# Patient Record
Sex: Female | Born: 1940
Health system: Southern US, Community
[De-identification: ages and names within clinical notes are randomized; demographics above are authoritative.]

## PROBLEM LIST (undated history)

## (undated) DIAGNOSIS — R569 Unspecified convulsions: Secondary | ICD-10-CM

## (undated) DIAGNOSIS — C801 Malignant (primary) neoplasm, unspecified: Secondary | ICD-10-CM

## (undated) DIAGNOSIS — I10 Essential (primary) hypertension: Secondary | ICD-10-CM

## (undated) DIAGNOSIS — M1990 Unspecified osteoarthritis, unspecified site: Secondary | ICD-10-CM

## (undated) DIAGNOSIS — H269 Unspecified cataract: Secondary | ICD-10-CM

## (undated) DIAGNOSIS — E039 Hypothyroidism, unspecified: Secondary | ICD-10-CM

## (undated) DIAGNOSIS — G40909 Epilepsy, unspecified, not intractable, without status epilepticus: Secondary | ICD-10-CM

## (undated) DIAGNOSIS — E785 Hyperlipidemia, unspecified: Secondary | ICD-10-CM

## (undated) DIAGNOSIS — K635 Polyp of colon: Secondary | ICD-10-CM

## (undated) HISTORY — DX: Hyperlipidemia, unspecified: E78.5

## (undated) HISTORY — DX: Epilepsy, unspecified, not intractable, without status epilepticus: G40.909

## (undated) HISTORY — DX: Unspecified osteoarthritis, unspecified site: M19.90

## (undated) HISTORY — DX: Hypothyroidism, unspecified: E03.9

## (undated) HISTORY — DX: Malignant (primary) neoplasm, unspecified: C80.1

## (undated) HISTORY — DX: Essential (primary) hypertension: I10

## (undated) HISTORY — DX: Unspecified convulsions: R56.9

## (undated) HISTORY — DX: Unspecified cataract: H26.9

## (undated) HISTORY — DX: Polyp of colon: K63.5

---
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
## 1968-09-12 HISTORY — PX: ABDOMINAL HYSTERECTOMY: SHX81

## 1982-09-12 HISTORY — PX: BREAST SURGERY: SHX581

## 2006-09-12 HISTORY — PX: COLON SURGERY: SHX602

## 2008-09-12 DIAGNOSIS — H269 Unspecified cataract: Secondary | ICD-10-CM

## 2008-09-12 HISTORY — DX: Unspecified cataract: H26.9

## 2015-08-11 DIAGNOSIS — G40909 Epilepsy, unspecified, not intractable, without status epilepticus: Secondary | ICD-10-CM

## 2015-08-11 DIAGNOSIS — K635 Polyp of colon: Secondary | ICD-10-CM

## 2015-08-11 HISTORY — DX: Epilepsy, unspecified, not intractable, without status epilepticus: G40.909

## 2015-08-11 HISTORY — DX: Polyp of colon: K63.5

## 2015-12-08 DIAGNOSIS — E785 Hyperlipidemia, unspecified: Secondary | ICD-10-CM

## 2015-12-08 HISTORY — DX: Hyperlipidemia, unspecified: E78.5

## 2016-01-15 LAB — COLOGUARD: COLOGUARD: NEGATIVE

## 2016-04-16 LAB — LIPID PANEL
Cholesterol: 199 (ref 0–200)
HDL: 56 (ref 35–70)
LDL Cholesterol: 129
Triglycerides: 70 (ref 40–160)

## 2016-04-16 LAB — HEMOGLOBIN A1C: Hemoglobin A1C: 6.2

## 2016-04-16 LAB — TSH: TSH: 0.57 (ref <=5.90)

## 2016-09-12 HISTORY — PX: FEMUR SURGERY: SHX943

## 2016-09-26 DIAGNOSIS — H26413 Soemmering's ring, bilateral: Secondary | ICD-10-CM | POA: Diagnosis not present

## 2016-09-26 DIAGNOSIS — H353131 Nonexudative age-related macular degeneration, bilateral, early dry stage: Secondary | ICD-10-CM | POA: Diagnosis not present

## 2016-10-26 DIAGNOSIS — L570 Actinic keratosis: Secondary | ICD-10-CM | POA: Diagnosis not present

## 2016-10-26 DIAGNOSIS — Z85828 Personal history of other malignant neoplasm of skin: Secondary | ICD-10-CM | POA: Diagnosis not present

## 2016-10-26 DIAGNOSIS — L578 Other skin changes due to chronic exposure to nonionizing radiation: Secondary | ICD-10-CM | POA: Diagnosis not present

## 2016-10-28 DIAGNOSIS — G8929 Other chronic pain: Secondary | ICD-10-CM | POA: Diagnosis present

## 2016-10-28 DIAGNOSIS — R55 Syncope and collapse: Secondary | ICD-10-CM | POA: Diagnosis not present

## 2016-10-28 DIAGNOSIS — I472 Ventricular tachycardia: Secondary | ICD-10-CM | POA: Diagnosis not present

## 2016-10-28 DIAGNOSIS — G40909 Epilepsy, unspecified, not intractable, without status epilepticus: Secondary | ICD-10-CM | POA: Diagnosis not present

## 2016-10-28 DIAGNOSIS — S72402A Unspecified fracture of lower end of left femur, initial encounter for closed fracture: Secondary | ICD-10-CM | POA: Diagnosis not present

## 2016-10-28 DIAGNOSIS — Z9849 Cataract extraction status, unspecified eye: Secondary | ICD-10-CM | POA: Diagnosis not present

## 2016-10-28 DIAGNOSIS — M6281 Muscle weakness (generalized): Secondary | ICD-10-CM | POA: Diagnosis not present

## 2016-10-28 DIAGNOSIS — S72332A Displaced oblique fracture of shaft of left femur, initial encounter for closed fracture: Secondary | ICD-10-CM | POA: Diagnosis not present

## 2016-10-28 DIAGNOSIS — Z888 Allergy status to other drugs, medicaments and biological substances status: Secondary | ICD-10-CM | POA: Diagnosis not present

## 2016-10-28 DIAGNOSIS — Z884 Allergy status to anesthetic agent status: Secondary | ICD-10-CM | POA: Diagnosis not present

## 2016-10-28 DIAGNOSIS — Z86010 Personal history of colonic polyps: Secondary | ICD-10-CM | POA: Diagnosis not present

## 2016-10-28 DIAGNOSIS — I110 Hypertensive heart disease with heart failure: Secondary | ICD-10-CM | POA: Diagnosis present

## 2016-10-28 DIAGNOSIS — I369 Nonrheumatic tricuspid valve disorder, unspecified: Secondary | ICD-10-CM | POA: Diagnosis not present

## 2016-10-28 DIAGNOSIS — E785 Hyperlipidemia, unspecified: Secondary | ICD-10-CM | POA: Diagnosis present

## 2016-10-28 DIAGNOSIS — I272 Pulmonary hypertension, unspecified: Secondary | ICD-10-CM | POA: Diagnosis not present

## 2016-10-28 DIAGNOSIS — Z85828 Personal history of other malignant neoplasm of skin: Secondary | ICD-10-CM | POA: Diagnosis not present

## 2016-10-28 DIAGNOSIS — M25561 Pain in right knee: Secondary | ICD-10-CM | POA: Diagnosis not present

## 2016-10-28 DIAGNOSIS — Z90710 Acquired absence of both cervix and uterus: Secondary | ICD-10-CM | POA: Diagnosis not present

## 2016-10-28 DIAGNOSIS — I5030 Unspecified diastolic (congestive) heart failure: Secondary | ICD-10-CM | POA: Diagnosis present

## 2016-10-28 DIAGNOSIS — S72402D Unspecified fracture of lower end of left femur, subsequent encounter for closed fracture with routine healing: Secondary | ICD-10-CM | POA: Diagnosis not present

## 2016-10-28 DIAGNOSIS — R410 Disorientation, unspecified: Secondary | ICD-10-CM | POA: Diagnosis not present

## 2016-10-28 DIAGNOSIS — D72829 Elevated white blood cell count, unspecified: Secondary | ICD-10-CM | POA: Diagnosis not present

## 2016-10-28 DIAGNOSIS — Z01810 Encounter for preprocedural cardiovascular examination: Secondary | ICD-10-CM | POA: Diagnosis not present

## 2016-10-28 DIAGNOSIS — M810 Age-related osteoporosis without current pathological fracture: Secondary | ICD-10-CM | POA: Diagnosis present

## 2016-10-28 DIAGNOSIS — R52 Pain, unspecified: Secondary | ICD-10-CM | POA: Diagnosis not present

## 2016-10-28 DIAGNOSIS — I10 Essential (primary) hypertension: Secondary | ICD-10-CM | POA: Diagnosis not present

## 2016-10-28 DIAGNOSIS — M25562 Pain in left knee: Secondary | ICD-10-CM | POA: Diagnosis not present

## 2016-10-28 DIAGNOSIS — G40309 Generalized idiopathic epilepsy and epileptic syndromes, not intractable, without status epilepticus: Secondary | ICD-10-CM | POA: Diagnosis not present

## 2016-10-28 DIAGNOSIS — W19XXXD Unspecified fall, subsequent encounter: Secondary | ICD-10-CM | POA: Diagnosis not present

## 2016-10-28 DIAGNOSIS — Z79899 Other long term (current) drug therapy: Secondary | ICD-10-CM | POA: Diagnosis not present

## 2016-10-28 DIAGNOSIS — X58XXXA Exposure to other specified factors, initial encounter: Secondary | ICD-10-CM | POA: Diagnosis not present

## 2016-10-28 DIAGNOSIS — M1711 Unilateral primary osteoarthritis, right knee: Secondary | ICD-10-CM | POA: Diagnosis present

## 2016-10-28 DIAGNOSIS — G40209 Localization-related (focal) (partial) symptomatic epilepsy and epileptic syndromes with complex partial seizures, not intractable, without status epilepticus: Secondary | ICD-10-CM | POA: Diagnosis present

## 2016-10-28 DIAGNOSIS — G40919 Epilepsy, unspecified, intractable, without status epilepticus: Secondary | ICD-10-CM | POA: Diagnosis not present

## 2016-10-28 DIAGNOSIS — S72452A Displaced supracondylar fracture without intracondylar extension of lower end of left femur, initial encounter for closed fracture: Secondary | ICD-10-CM | POA: Diagnosis present

## 2016-10-28 DIAGNOSIS — S72492A Other fracture of lower end of left femur, initial encounter for closed fracture: Secondary | ICD-10-CM | POA: Diagnosis not present

## 2016-11-02 DIAGNOSIS — Z85828 Personal history of other malignant neoplasm of skin: Secondary | ICD-10-CM | POA: Diagnosis not present

## 2016-11-02 DIAGNOSIS — G40209 Localization-related (focal) (partial) symptomatic epilepsy and epileptic syndromes with complex partial seizures, not intractable, without status epilepticus: Secondary | ICD-10-CM | POA: Diagnosis not present

## 2016-11-02 DIAGNOSIS — M25562 Pain in left knee: Secondary | ICD-10-CM | POA: Diagnosis not present

## 2016-11-02 DIAGNOSIS — Z9289 Personal history of other medical treatment: Secondary | ICD-10-CM | POA: Diagnosis not present

## 2016-11-02 DIAGNOSIS — D72829 Elevated white blood cell count, unspecified: Secondary | ICD-10-CM | POA: Diagnosis not present

## 2016-11-02 DIAGNOSIS — I472 Ventricular tachycardia: Secondary | ICD-10-CM | POA: Diagnosis not present

## 2016-11-02 DIAGNOSIS — J9811 Atelectasis: Secondary | ICD-10-CM | POA: Diagnosis not present

## 2016-11-02 DIAGNOSIS — R339 Retention of urine, unspecified: Secondary | ICD-10-CM | POA: Diagnosis not present

## 2016-11-02 DIAGNOSIS — K5900 Constipation, unspecified: Secondary | ICD-10-CM | POA: Diagnosis not present

## 2016-11-02 DIAGNOSIS — M810 Age-related osteoporosis without current pathological fracture: Secondary | ICD-10-CM | POA: Diagnosis not present

## 2016-11-02 DIAGNOSIS — Z9849 Cataract extraction status, unspecified eye: Secondary | ICD-10-CM | POA: Diagnosis not present

## 2016-11-02 DIAGNOSIS — I10 Essential (primary) hypertension: Secondary | ICD-10-CM | POA: Diagnosis not present

## 2016-11-02 DIAGNOSIS — Z86010 Personal history of colonic polyps: Secondary | ICD-10-CM | POA: Diagnosis not present

## 2016-11-02 DIAGNOSIS — R600 Localized edema: Secondary | ICD-10-CM | POA: Diagnosis not present

## 2016-11-02 DIAGNOSIS — Z79899 Other long term (current) drug therapy: Secondary | ICD-10-CM | POA: Diagnosis not present

## 2016-11-02 DIAGNOSIS — G40919 Epilepsy, unspecified, intractable, without status epilepticus: Secondary | ICD-10-CM | POA: Diagnosis not present

## 2016-11-02 DIAGNOSIS — R5381 Other malaise: Secondary | ICD-10-CM | POA: Diagnosis not present

## 2016-11-02 DIAGNOSIS — S72402D Unspecified fracture of lower end of left femur, subsequent encounter for closed fracture with routine healing: Secondary | ICD-10-CM | POA: Diagnosis not present

## 2016-11-02 DIAGNOSIS — S72452D Displaced supracondylar fracture without intracondylar extension of lower end of left femur, subsequent encounter for closed fracture with routine healing: Secondary | ICD-10-CM | POA: Diagnosis not present

## 2016-11-02 DIAGNOSIS — G40909 Epilepsy, unspecified, not intractable, without status epilepticus: Secondary | ICD-10-CM | POA: Diagnosis not present

## 2016-11-02 DIAGNOSIS — E785 Hyperlipidemia, unspecified: Secondary | ICD-10-CM | POA: Diagnosis not present

## 2016-11-02 DIAGNOSIS — D649 Anemia, unspecified: Secondary | ICD-10-CM | POA: Diagnosis not present

## 2016-11-02 DIAGNOSIS — Z888 Allergy status to other drugs, medicaments and biological substances status: Secondary | ICD-10-CM | POA: Diagnosis not present

## 2016-11-02 DIAGNOSIS — R52 Pain, unspecified: Secondary | ICD-10-CM | POA: Diagnosis not present

## 2016-11-02 DIAGNOSIS — Z884 Allergy status to anesthetic agent status: Secondary | ICD-10-CM | POA: Diagnosis not present

## 2016-11-02 DIAGNOSIS — W19XXXD Unspecified fall, subsequent encounter: Secondary | ICD-10-CM | POA: Diagnosis not present

## 2016-11-02 DIAGNOSIS — Z90710 Acquired absence of both cervix and uterus: Secondary | ICD-10-CM | POA: Diagnosis not present

## 2016-11-02 DIAGNOSIS — Z4889 Encounter for other specified surgical aftercare: Secondary | ICD-10-CM | POA: Diagnosis not present

## 2016-11-02 DIAGNOSIS — G8929 Other chronic pain: Secondary | ICD-10-CM | POA: Diagnosis not present

## 2016-11-02 DIAGNOSIS — I5030 Unspecified diastolic (congestive) heart failure: Secondary | ICD-10-CM | POA: Diagnosis not present

## 2016-11-02 DIAGNOSIS — R55 Syncope and collapse: Secondary | ICD-10-CM | POA: Diagnosis not present

## 2016-11-02 DIAGNOSIS — M1711 Unilateral primary osteoarthritis, right knee: Secondary | ICD-10-CM | POA: Diagnosis not present

## 2016-11-02 DIAGNOSIS — S72402S Unspecified fracture of lower end of left femur, sequela: Secondary | ICD-10-CM | POA: Diagnosis not present

## 2016-11-02 DIAGNOSIS — G40001 Localization-related (focal) (partial) idiopathic epilepsy and epileptic syndromes with seizures of localized onset, not intractable, with status epilepticus: Secondary | ICD-10-CM | POA: Diagnosis not present

## 2016-11-02 DIAGNOSIS — S72452A Displaced supracondylar fracture without intracondylar extension of lower end of left femur, initial encounter for closed fracture: Secondary | ICD-10-CM | POA: Diagnosis not present

## 2016-11-02 DIAGNOSIS — I110 Hypertensive heart disease with heart failure: Secondary | ICD-10-CM | POA: Diagnosis not present

## 2016-11-02 DIAGNOSIS — M6281 Muscle weakness (generalized): Secondary | ICD-10-CM | POA: Diagnosis not present

## 2016-11-02 DIAGNOSIS — D473 Essential (hemorrhagic) thrombocythemia: Secondary | ICD-10-CM | POA: Diagnosis not present

## 2016-11-04 DIAGNOSIS — Z4889 Encounter for other specified surgical aftercare: Secondary | ICD-10-CM | POA: Diagnosis not present

## 2016-11-04 DIAGNOSIS — R55 Syncope and collapse: Secondary | ICD-10-CM | POA: Diagnosis not present

## 2016-11-04 DIAGNOSIS — Z9289 Personal history of other medical treatment: Secondary | ICD-10-CM | POA: Diagnosis not present

## 2016-11-04 DIAGNOSIS — S72402S Unspecified fracture of lower end of left femur, sequela: Secondary | ICD-10-CM | POA: Diagnosis not present

## 2016-11-04 DIAGNOSIS — D649 Anemia, unspecified: Secondary | ICD-10-CM | POA: Diagnosis not present

## 2016-11-04 DIAGNOSIS — G40001 Localization-related (focal) (partial) idiopathic epilepsy and epileptic syndromes with seizures of localized onset, not intractable, with status epilepticus: Secondary | ICD-10-CM | POA: Diagnosis not present

## 2016-11-04 DIAGNOSIS — K5900 Constipation, unspecified: Secondary | ICD-10-CM | POA: Diagnosis not present

## 2016-11-04 DIAGNOSIS — R5381 Other malaise: Secondary | ICD-10-CM | POA: Diagnosis not present

## 2016-11-04 DIAGNOSIS — D473 Essential (hemorrhagic) thrombocythemia: Secondary | ICD-10-CM | POA: Diagnosis not present

## 2016-11-04 DIAGNOSIS — I10 Essential (primary) hypertension: Secondary | ICD-10-CM | POA: Diagnosis not present

## 2016-11-07 DIAGNOSIS — R55 Syncope and collapse: Secondary | ICD-10-CM | POA: Diagnosis not present

## 2016-11-07 DIAGNOSIS — J9811 Atelectasis: Secondary | ICD-10-CM | POA: Diagnosis not present

## 2016-11-07 DIAGNOSIS — R339 Retention of urine, unspecified: Secondary | ICD-10-CM | POA: Diagnosis not present

## 2016-11-07 DIAGNOSIS — S72402S Unspecified fracture of lower end of left femur, sequela: Secondary | ICD-10-CM | POA: Diagnosis not present

## 2016-11-09 DIAGNOSIS — R55 Syncope and collapse: Secondary | ICD-10-CM | POA: Diagnosis not present

## 2016-11-10 DIAGNOSIS — S72402S Unspecified fracture of lower end of left femur, sequela: Secondary | ICD-10-CM | POA: Diagnosis not present

## 2016-11-10 DIAGNOSIS — G40909 Epilepsy, unspecified, not intractable, without status epilepticus: Secondary | ICD-10-CM | POA: Diagnosis not present

## 2016-11-10 DIAGNOSIS — R55 Syncope and collapse: Secondary | ICD-10-CM | POA: Diagnosis not present

## 2016-11-18 DIAGNOSIS — R339 Retention of urine, unspecified: Secondary | ICD-10-CM | POA: Diagnosis not present

## 2016-11-18 DIAGNOSIS — S72402S Unspecified fracture of lower end of left femur, sequela: Secondary | ICD-10-CM | POA: Diagnosis not present

## 2016-11-22 DIAGNOSIS — S72402S Unspecified fracture of lower end of left femur, sequela: Secondary | ICD-10-CM | POA: Diagnosis not present

## 2016-11-22 DIAGNOSIS — R339 Retention of urine, unspecified: Secondary | ICD-10-CM | POA: Diagnosis not present

## 2016-12-05 DIAGNOSIS — R52 Pain, unspecified: Secondary | ICD-10-CM | POA: Diagnosis not present

## 2016-12-05 DIAGNOSIS — S72402S Unspecified fracture of lower end of left femur, sequela: Secondary | ICD-10-CM | POA: Diagnosis not present

## 2016-12-05 DIAGNOSIS — I10 Essential (primary) hypertension: Secondary | ICD-10-CM | POA: Diagnosis not present

## 2016-12-05 DIAGNOSIS — R339 Retention of urine, unspecified: Secondary | ICD-10-CM | POA: Diagnosis not present

## 2016-12-05 DIAGNOSIS — G40909 Epilepsy, unspecified, not intractable, without status epilepticus: Secondary | ICD-10-CM | POA: Diagnosis not present

## 2016-12-09 DIAGNOSIS — G40909 Epilepsy, unspecified, not intractable, without status epilepticus: Secondary | ICD-10-CM | POA: Diagnosis not present

## 2016-12-09 DIAGNOSIS — S72402S Unspecified fracture of lower end of left femur, sequela: Secondary | ICD-10-CM | POA: Diagnosis not present

## 2016-12-09 DIAGNOSIS — I10 Essential (primary) hypertension: Secondary | ICD-10-CM | POA: Diagnosis not present

## 2016-12-13 DIAGNOSIS — S72452D Displaced supracondylar fracture without intracondylar extension of lower end of left femur, subsequent encounter for closed fracture with routine healing: Secondary | ICD-10-CM | POA: Diagnosis not present

## 2016-12-20 DIAGNOSIS — R600 Localized edema: Secondary | ICD-10-CM | POA: Diagnosis not present

## 2016-12-20 DIAGNOSIS — S72402S Unspecified fracture of lower end of left femur, sequela: Secondary | ICD-10-CM | POA: Diagnosis not present

## 2016-12-20 DIAGNOSIS — G40909 Epilepsy, unspecified, not intractable, without status epilepticus: Secondary | ICD-10-CM | POA: Diagnosis not present

## 2016-12-29 DIAGNOSIS — M25562 Pain in left knee: Secondary | ICD-10-CM | POA: Diagnosis not present

## 2016-12-29 DIAGNOSIS — I10 Essential (primary) hypertension: Secondary | ICD-10-CM | POA: Diagnosis not present

## 2016-12-29 DIAGNOSIS — R5381 Other malaise: Secondary | ICD-10-CM | POA: Diagnosis not present

## 2016-12-29 DIAGNOSIS — Z85828 Personal history of other malignant neoplasm of skin: Secondary | ICD-10-CM | POA: Diagnosis not present

## 2016-12-29 DIAGNOSIS — G40919 Epilepsy, unspecified, intractable, without status epilepticus: Secondary | ICD-10-CM | POA: Diagnosis not present

## 2016-12-29 DIAGNOSIS — W19XXXD Unspecified fall, subsequent encounter: Secondary | ICD-10-CM | POA: Diagnosis not present

## 2016-12-29 DIAGNOSIS — M80052D Age-related osteoporosis with current pathological fracture, left femur, subsequent encounter for fracture with routine healing: Secondary | ICD-10-CM | POA: Diagnosis not present

## 2016-12-30 DIAGNOSIS — R600 Localized edema: Secondary | ICD-10-CM | POA: Diagnosis not present

## 2016-12-30 DIAGNOSIS — S72402S Unspecified fracture of lower end of left femur, sequela: Secondary | ICD-10-CM | POA: Diagnosis not present

## 2016-12-30 DIAGNOSIS — G40909 Epilepsy, unspecified, not intractable, without status epilepticus: Secondary | ICD-10-CM | POA: Diagnosis not present

## 2017-01-02 DIAGNOSIS — M80052D Age-related osteoporosis with current pathological fracture, left femur, subsequent encounter for fracture with routine healing: Secondary | ICD-10-CM | POA: Diagnosis not present

## 2017-01-02 DIAGNOSIS — G40919 Epilepsy, unspecified, intractable, without status epilepticus: Secondary | ICD-10-CM | POA: Diagnosis not present

## 2017-01-02 DIAGNOSIS — M25562 Pain in left knee: Secondary | ICD-10-CM | POA: Diagnosis not present

## 2017-01-02 DIAGNOSIS — Z85828 Personal history of other malignant neoplasm of skin: Secondary | ICD-10-CM | POA: Diagnosis not present

## 2017-01-02 DIAGNOSIS — I10 Essential (primary) hypertension: Secondary | ICD-10-CM | POA: Diagnosis not present

## 2017-01-02 DIAGNOSIS — R5381 Other malaise: Secondary | ICD-10-CM | POA: Diagnosis not present

## 2017-01-03 DIAGNOSIS — G40919 Epilepsy, unspecified, intractable, without status epilepticus: Secondary | ICD-10-CM | POA: Diagnosis not present

## 2017-01-03 DIAGNOSIS — M80052D Age-related osteoporosis with current pathological fracture, left femur, subsequent encounter for fracture with routine healing: Secondary | ICD-10-CM | POA: Diagnosis not present

## 2017-01-03 DIAGNOSIS — R5381 Other malaise: Secondary | ICD-10-CM | POA: Diagnosis not present

## 2017-01-03 DIAGNOSIS — M25562 Pain in left knee: Secondary | ICD-10-CM | POA: Diagnosis not present

## 2017-01-03 DIAGNOSIS — Z85828 Personal history of other malignant neoplasm of skin: Secondary | ICD-10-CM | POA: Diagnosis not present

## 2017-01-03 DIAGNOSIS — I10 Essential (primary) hypertension: Secondary | ICD-10-CM | POA: Diagnosis not present

## 2017-01-04 DIAGNOSIS — M25562 Pain in left knee: Secondary | ICD-10-CM | POA: Diagnosis not present

## 2017-01-04 DIAGNOSIS — I10 Essential (primary) hypertension: Secondary | ICD-10-CM | POA: Diagnosis not present

## 2017-01-04 DIAGNOSIS — G40919 Epilepsy, unspecified, intractable, without status epilepticus: Secondary | ICD-10-CM | POA: Diagnosis not present

## 2017-01-04 DIAGNOSIS — Z85828 Personal history of other malignant neoplasm of skin: Secondary | ICD-10-CM | POA: Diagnosis not present

## 2017-01-04 DIAGNOSIS — R5381 Other malaise: Secondary | ICD-10-CM | POA: Diagnosis not present

## 2017-01-04 DIAGNOSIS — M80052D Age-related osteoporosis with current pathological fracture, left femur, subsequent encounter for fracture with routine healing: Secondary | ICD-10-CM | POA: Diagnosis not present

## 2017-01-09 DIAGNOSIS — I10 Essential (primary) hypertension: Secondary | ICD-10-CM | POA: Diagnosis not present

## 2017-01-09 DIAGNOSIS — M25562 Pain in left knee: Secondary | ICD-10-CM | POA: Diagnosis not present

## 2017-01-09 DIAGNOSIS — M80052D Age-related osteoporosis with current pathological fracture, left femur, subsequent encounter for fracture with routine healing: Secondary | ICD-10-CM | POA: Diagnosis not present

## 2017-01-09 DIAGNOSIS — G40919 Epilepsy, unspecified, intractable, without status epilepticus: Secondary | ICD-10-CM | POA: Diagnosis not present

## 2017-01-09 DIAGNOSIS — R5381 Other malaise: Secondary | ICD-10-CM | POA: Diagnosis not present

## 2017-01-09 DIAGNOSIS — Z85828 Personal history of other malignant neoplasm of skin: Secondary | ICD-10-CM | POA: Diagnosis not present

## 2017-01-10 DIAGNOSIS — Z85828 Personal history of other malignant neoplasm of skin: Secondary | ICD-10-CM | POA: Diagnosis not present

## 2017-01-10 DIAGNOSIS — M25562 Pain in left knee: Secondary | ICD-10-CM | POA: Diagnosis not present

## 2017-01-10 DIAGNOSIS — G40919 Epilepsy, unspecified, intractable, without status epilepticus: Secondary | ICD-10-CM | POA: Diagnosis not present

## 2017-01-10 DIAGNOSIS — M80052D Age-related osteoporosis with current pathological fracture, left femur, subsequent encounter for fracture with routine healing: Secondary | ICD-10-CM | POA: Diagnosis not present

## 2017-01-10 DIAGNOSIS — I10 Essential (primary) hypertension: Secondary | ICD-10-CM | POA: Diagnosis not present

## 2017-01-10 DIAGNOSIS — S72452D Displaced supracondylar fracture without intracondylar extension of lower end of left femur, subsequent encounter for closed fracture with routine healing: Secondary | ICD-10-CM | POA: Diagnosis not present

## 2017-01-10 DIAGNOSIS — R5381 Other malaise: Secondary | ICD-10-CM | POA: Diagnosis not present

## 2017-01-11 DIAGNOSIS — M80052D Age-related osteoporosis with current pathological fracture, left femur, subsequent encounter for fracture with routine healing: Secondary | ICD-10-CM | POA: Diagnosis not present

## 2017-01-11 DIAGNOSIS — I10 Essential (primary) hypertension: Secondary | ICD-10-CM | POA: Diagnosis not present

## 2017-01-11 DIAGNOSIS — M25562 Pain in left knee: Secondary | ICD-10-CM | POA: Diagnosis not present

## 2017-01-11 DIAGNOSIS — G40919 Epilepsy, unspecified, intractable, without status epilepticus: Secondary | ICD-10-CM | POA: Diagnosis not present

## 2017-01-11 DIAGNOSIS — Z85828 Personal history of other malignant neoplasm of skin: Secondary | ICD-10-CM | POA: Diagnosis not present

## 2017-01-11 DIAGNOSIS — R5381 Other malaise: Secondary | ICD-10-CM | POA: Diagnosis not present

## 2017-01-13 DIAGNOSIS — I10 Essential (primary) hypertension: Secondary | ICD-10-CM | POA: Diagnosis not present

## 2017-01-13 DIAGNOSIS — Z85828 Personal history of other malignant neoplasm of skin: Secondary | ICD-10-CM | POA: Diagnosis not present

## 2017-01-13 DIAGNOSIS — G40919 Epilepsy, unspecified, intractable, without status epilepticus: Secondary | ICD-10-CM | POA: Diagnosis not present

## 2017-01-13 DIAGNOSIS — M80052D Age-related osteoporosis with current pathological fracture, left femur, subsequent encounter for fracture with routine healing: Secondary | ICD-10-CM | POA: Diagnosis not present

## 2017-01-13 DIAGNOSIS — R5381 Other malaise: Secondary | ICD-10-CM | POA: Diagnosis not present

## 2017-01-13 DIAGNOSIS — M25562 Pain in left knee: Secondary | ICD-10-CM | POA: Diagnosis not present

## 2017-01-16 DIAGNOSIS — G40919 Epilepsy, unspecified, intractable, without status epilepticus: Secondary | ICD-10-CM | POA: Diagnosis not present

## 2017-01-16 DIAGNOSIS — R5381 Other malaise: Secondary | ICD-10-CM | POA: Diagnosis not present

## 2017-01-16 DIAGNOSIS — M80052D Age-related osteoporosis with current pathological fracture, left femur, subsequent encounter for fracture with routine healing: Secondary | ICD-10-CM | POA: Diagnosis not present

## 2017-01-16 DIAGNOSIS — I10 Essential (primary) hypertension: Secondary | ICD-10-CM | POA: Diagnosis not present

## 2017-01-16 DIAGNOSIS — M25562 Pain in left knee: Secondary | ICD-10-CM | POA: Diagnosis not present

## 2017-01-16 DIAGNOSIS — Z85828 Personal history of other malignant neoplasm of skin: Secondary | ICD-10-CM | POA: Diagnosis not present

## 2017-01-18 DIAGNOSIS — G40919 Epilepsy, unspecified, intractable, without status epilepticus: Secondary | ICD-10-CM | POA: Diagnosis not present

## 2017-01-18 DIAGNOSIS — I10 Essential (primary) hypertension: Secondary | ICD-10-CM | POA: Diagnosis not present

## 2017-01-18 DIAGNOSIS — R5381 Other malaise: Secondary | ICD-10-CM | POA: Diagnosis not present

## 2017-01-18 DIAGNOSIS — M80052D Age-related osteoporosis with current pathological fracture, left femur, subsequent encounter for fracture with routine healing: Secondary | ICD-10-CM | POA: Diagnosis not present

## 2017-01-18 DIAGNOSIS — M25562 Pain in left knee: Secondary | ICD-10-CM | POA: Diagnosis not present

## 2017-01-18 DIAGNOSIS — Z85828 Personal history of other malignant neoplasm of skin: Secondary | ICD-10-CM | POA: Diagnosis not present

## 2017-01-19 DIAGNOSIS — G40919 Epilepsy, unspecified, intractable, without status epilepticus: Secondary | ICD-10-CM | POA: Diagnosis not present

## 2017-01-19 DIAGNOSIS — I10 Essential (primary) hypertension: Secondary | ICD-10-CM | POA: Diagnosis not present

## 2017-01-19 DIAGNOSIS — M25562 Pain in left knee: Secondary | ICD-10-CM | POA: Diagnosis not present

## 2017-01-19 DIAGNOSIS — R5381 Other malaise: Secondary | ICD-10-CM | POA: Diagnosis not present

## 2017-01-19 DIAGNOSIS — Z85828 Personal history of other malignant neoplasm of skin: Secondary | ICD-10-CM | POA: Diagnosis not present

## 2017-01-19 DIAGNOSIS — M80052D Age-related osteoporosis with current pathological fracture, left femur, subsequent encounter for fracture with routine healing: Secondary | ICD-10-CM | POA: Diagnosis not present

## 2017-01-23 DIAGNOSIS — I10 Essential (primary) hypertension: Secondary | ICD-10-CM | POA: Diagnosis not present

## 2017-01-23 DIAGNOSIS — M80052D Age-related osteoporosis with current pathological fracture, left femur, subsequent encounter for fracture with routine healing: Secondary | ICD-10-CM | POA: Diagnosis not present

## 2017-01-23 DIAGNOSIS — Z85828 Personal history of other malignant neoplasm of skin: Secondary | ICD-10-CM | POA: Diagnosis not present

## 2017-01-23 DIAGNOSIS — M25562 Pain in left knee: Secondary | ICD-10-CM | POA: Diagnosis not present

## 2017-01-23 DIAGNOSIS — R5381 Other malaise: Secondary | ICD-10-CM | POA: Diagnosis not present

## 2017-01-23 DIAGNOSIS — G40919 Epilepsy, unspecified, intractable, without status epilepticus: Secondary | ICD-10-CM | POA: Diagnosis not present

## 2017-01-24 DIAGNOSIS — M80052D Age-related osteoporosis with current pathological fracture, left femur, subsequent encounter for fracture with routine healing: Secondary | ICD-10-CM | POA: Diagnosis not present

## 2017-01-24 DIAGNOSIS — M25562 Pain in left knee: Secondary | ICD-10-CM | POA: Diagnosis not present

## 2017-01-24 DIAGNOSIS — G40919 Epilepsy, unspecified, intractable, without status epilepticus: Secondary | ICD-10-CM | POA: Diagnosis not present

## 2017-01-24 DIAGNOSIS — R5381 Other malaise: Secondary | ICD-10-CM | POA: Diagnosis not present

## 2017-01-24 DIAGNOSIS — I10 Essential (primary) hypertension: Secondary | ICD-10-CM | POA: Diagnosis not present

## 2017-01-24 DIAGNOSIS — Z85828 Personal history of other malignant neoplasm of skin: Secondary | ICD-10-CM | POA: Diagnosis not present

## 2017-01-25 DIAGNOSIS — G40919 Epilepsy, unspecified, intractable, without status epilepticus: Secondary | ICD-10-CM | POA: Diagnosis not present

## 2017-01-25 DIAGNOSIS — M25562 Pain in left knee: Secondary | ICD-10-CM | POA: Diagnosis not present

## 2017-01-25 DIAGNOSIS — Z85828 Personal history of other malignant neoplasm of skin: Secondary | ICD-10-CM | POA: Diagnosis not present

## 2017-01-25 DIAGNOSIS — R5381 Other malaise: Secondary | ICD-10-CM | POA: Diagnosis not present

## 2017-01-25 DIAGNOSIS — I10 Essential (primary) hypertension: Secondary | ICD-10-CM | POA: Diagnosis not present

## 2017-01-25 DIAGNOSIS — M80052D Age-related osteoporosis with current pathological fracture, left femur, subsequent encounter for fracture with routine healing: Secondary | ICD-10-CM | POA: Diagnosis not present

## 2017-01-26 DIAGNOSIS — Z23 Encounter for immunization: Secondary | ICD-10-CM | POA: Diagnosis not present

## 2017-01-26 DIAGNOSIS — G40309 Generalized idiopathic epilepsy and epileptic syndromes, not intractable, without status epilepticus: Secondary | ICD-10-CM | POA: Diagnosis not present

## 2017-01-26 DIAGNOSIS — M84750D Atypical femoral fracture, unspecified, subsequent encounter for fracture with routine healing: Secondary | ICD-10-CM | POA: Diagnosis not present

## 2017-01-26 DIAGNOSIS — R600 Localized edema: Secondary | ICD-10-CM | POA: Diagnosis not present

## 2017-01-26 DIAGNOSIS — M6281 Muscle weakness (generalized): Secondary | ICD-10-CM | POA: Diagnosis not present

## 2017-01-27 DIAGNOSIS — I10 Essential (primary) hypertension: Secondary | ICD-10-CM | POA: Diagnosis not present

## 2017-01-27 DIAGNOSIS — Z85828 Personal history of other malignant neoplasm of skin: Secondary | ICD-10-CM | POA: Diagnosis not present

## 2017-01-27 DIAGNOSIS — M80052D Age-related osteoporosis with current pathological fracture, left femur, subsequent encounter for fracture with routine healing: Secondary | ICD-10-CM | POA: Diagnosis not present

## 2017-01-27 DIAGNOSIS — G40919 Epilepsy, unspecified, intractable, without status epilepticus: Secondary | ICD-10-CM | POA: Diagnosis not present

## 2017-01-27 DIAGNOSIS — R5381 Other malaise: Secondary | ICD-10-CM | POA: Diagnosis not present

## 2017-01-27 DIAGNOSIS — M25562 Pain in left knee: Secondary | ICD-10-CM | POA: Diagnosis not present

## 2017-01-30 DIAGNOSIS — R5381 Other malaise: Secondary | ICD-10-CM | POA: Diagnosis not present

## 2017-01-30 DIAGNOSIS — I10 Essential (primary) hypertension: Secondary | ICD-10-CM | POA: Diagnosis not present

## 2017-01-30 DIAGNOSIS — M25562 Pain in left knee: Secondary | ICD-10-CM | POA: Diagnosis not present

## 2017-01-30 DIAGNOSIS — Z85828 Personal history of other malignant neoplasm of skin: Secondary | ICD-10-CM | POA: Diagnosis not present

## 2017-01-30 DIAGNOSIS — G40919 Epilepsy, unspecified, intractable, without status epilepticus: Secondary | ICD-10-CM | POA: Diagnosis not present

## 2017-01-30 DIAGNOSIS — M80052D Age-related osteoporosis with current pathological fracture, left femur, subsequent encounter for fracture with routine healing: Secondary | ICD-10-CM | POA: Diagnosis not present

## 2017-01-31 DIAGNOSIS — G40919 Epilepsy, unspecified, intractable, without status epilepticus: Secondary | ICD-10-CM | POA: Diagnosis not present

## 2017-01-31 DIAGNOSIS — R5381 Other malaise: Secondary | ICD-10-CM | POA: Diagnosis not present

## 2017-01-31 DIAGNOSIS — I10 Essential (primary) hypertension: Secondary | ICD-10-CM | POA: Diagnosis not present

## 2017-01-31 DIAGNOSIS — Z85828 Personal history of other malignant neoplasm of skin: Secondary | ICD-10-CM | POA: Diagnosis not present

## 2017-01-31 DIAGNOSIS — M25562 Pain in left knee: Secondary | ICD-10-CM | POA: Diagnosis not present

## 2017-01-31 DIAGNOSIS — M80052D Age-related osteoporosis with current pathological fracture, left femur, subsequent encounter for fracture with routine healing: Secondary | ICD-10-CM | POA: Diagnosis not present

## 2017-02-01 DIAGNOSIS — G40919 Epilepsy, unspecified, intractable, without status epilepticus: Secondary | ICD-10-CM | POA: Diagnosis not present

## 2017-02-01 DIAGNOSIS — M25562 Pain in left knee: Secondary | ICD-10-CM | POA: Diagnosis not present

## 2017-02-01 DIAGNOSIS — M80052D Age-related osteoporosis with current pathological fracture, left femur, subsequent encounter for fracture with routine healing: Secondary | ICD-10-CM | POA: Diagnosis not present

## 2017-02-01 DIAGNOSIS — I10 Essential (primary) hypertension: Secondary | ICD-10-CM | POA: Diagnosis not present

## 2017-02-01 DIAGNOSIS — R5381 Other malaise: Secondary | ICD-10-CM | POA: Diagnosis not present

## 2017-02-01 DIAGNOSIS — Z85828 Personal history of other malignant neoplasm of skin: Secondary | ICD-10-CM | POA: Diagnosis not present

## 2017-02-03 DIAGNOSIS — G40919 Epilepsy, unspecified, intractable, without status epilepticus: Secondary | ICD-10-CM | POA: Diagnosis not present

## 2017-02-03 DIAGNOSIS — M80052D Age-related osteoporosis with current pathological fracture, left femur, subsequent encounter for fracture with routine healing: Secondary | ICD-10-CM | POA: Diagnosis not present

## 2017-02-03 DIAGNOSIS — R5381 Other malaise: Secondary | ICD-10-CM | POA: Diagnosis not present

## 2017-02-03 DIAGNOSIS — I10 Essential (primary) hypertension: Secondary | ICD-10-CM | POA: Diagnosis not present

## 2017-02-03 DIAGNOSIS — M25562 Pain in left knee: Secondary | ICD-10-CM | POA: Diagnosis not present

## 2017-02-03 DIAGNOSIS — Z85828 Personal history of other malignant neoplasm of skin: Secondary | ICD-10-CM | POA: Diagnosis not present

## 2017-02-09 DIAGNOSIS — R5381 Other malaise: Secondary | ICD-10-CM | POA: Diagnosis not present

## 2017-02-09 DIAGNOSIS — Z85828 Personal history of other malignant neoplasm of skin: Secondary | ICD-10-CM | POA: Diagnosis not present

## 2017-02-09 DIAGNOSIS — M80052D Age-related osteoporosis with current pathological fracture, left femur, subsequent encounter for fracture with routine healing: Secondary | ICD-10-CM | POA: Diagnosis not present

## 2017-02-09 DIAGNOSIS — M25562 Pain in left knee: Secondary | ICD-10-CM | POA: Diagnosis not present

## 2017-02-09 DIAGNOSIS — I10 Essential (primary) hypertension: Secondary | ICD-10-CM | POA: Diagnosis not present

## 2017-02-09 DIAGNOSIS — G40919 Epilepsy, unspecified, intractable, without status epilepticus: Secondary | ICD-10-CM | POA: Diagnosis not present

## 2017-02-14 DIAGNOSIS — G40919 Epilepsy, unspecified, intractable, without status epilepticus: Secondary | ICD-10-CM | POA: Diagnosis not present

## 2017-02-14 DIAGNOSIS — I10 Essential (primary) hypertension: Secondary | ICD-10-CM | POA: Diagnosis not present

## 2017-02-14 DIAGNOSIS — M80052D Age-related osteoporosis with current pathological fracture, left femur, subsequent encounter for fracture with routine healing: Secondary | ICD-10-CM | POA: Diagnosis not present

## 2017-02-14 DIAGNOSIS — Z85828 Personal history of other malignant neoplasm of skin: Secondary | ICD-10-CM | POA: Diagnosis not present

## 2017-02-14 DIAGNOSIS — M25562 Pain in left knee: Secondary | ICD-10-CM | POA: Diagnosis not present

## 2017-02-14 DIAGNOSIS — R5381 Other malaise: Secondary | ICD-10-CM | POA: Diagnosis not present

## 2017-02-17 DIAGNOSIS — I10 Essential (primary) hypertension: Secondary | ICD-10-CM | POA: Diagnosis not present

## 2017-02-17 DIAGNOSIS — G40919 Epilepsy, unspecified, intractable, without status epilepticus: Secondary | ICD-10-CM | POA: Diagnosis not present

## 2017-02-17 DIAGNOSIS — M80052D Age-related osteoporosis with current pathological fracture, left femur, subsequent encounter for fracture with routine healing: Secondary | ICD-10-CM | POA: Diagnosis not present

## 2017-02-17 DIAGNOSIS — M25562 Pain in left knee: Secondary | ICD-10-CM | POA: Diagnosis not present

## 2017-02-17 DIAGNOSIS — R5381 Other malaise: Secondary | ICD-10-CM | POA: Diagnosis not present

## 2017-02-17 DIAGNOSIS — Z85828 Personal history of other malignant neoplasm of skin: Secondary | ICD-10-CM | POA: Diagnosis not present

## 2017-02-20 DIAGNOSIS — I10 Essential (primary) hypertension: Secondary | ICD-10-CM | POA: Diagnosis not present

## 2017-02-20 DIAGNOSIS — M80052D Age-related osteoporosis with current pathological fracture, left femur, subsequent encounter for fracture with routine healing: Secondary | ICD-10-CM | POA: Diagnosis not present

## 2017-02-20 DIAGNOSIS — R5381 Other malaise: Secondary | ICD-10-CM | POA: Diagnosis not present

## 2017-02-20 DIAGNOSIS — Z85828 Personal history of other malignant neoplasm of skin: Secondary | ICD-10-CM | POA: Diagnosis not present

## 2017-02-20 DIAGNOSIS — G40919 Epilepsy, unspecified, intractable, without status epilepticus: Secondary | ICD-10-CM | POA: Diagnosis not present

## 2017-02-20 DIAGNOSIS — M25562 Pain in left knee: Secondary | ICD-10-CM | POA: Diagnosis not present

## 2017-02-21 DIAGNOSIS — M1712 Unilateral primary osteoarthritis, left knee: Secondary | ICD-10-CM | POA: Diagnosis not present

## 2017-02-21 DIAGNOSIS — S72452D Displaced supracondylar fracture without intracondylar extension of lower end of left femur, subsequent encounter for closed fracture with routine healing: Secondary | ICD-10-CM | POA: Diagnosis not present

## 2017-02-23 DIAGNOSIS — G40919 Epilepsy, unspecified, intractable, without status epilepticus: Secondary | ICD-10-CM | POA: Diagnosis not present

## 2017-02-23 DIAGNOSIS — Z85828 Personal history of other malignant neoplasm of skin: Secondary | ICD-10-CM | POA: Diagnosis not present

## 2017-02-23 DIAGNOSIS — I10 Essential (primary) hypertension: Secondary | ICD-10-CM | POA: Diagnosis not present

## 2017-02-23 DIAGNOSIS — M25562 Pain in left knee: Secondary | ICD-10-CM | POA: Diagnosis not present

## 2017-02-23 DIAGNOSIS — R5381 Other malaise: Secondary | ICD-10-CM | POA: Diagnosis not present

## 2017-02-23 DIAGNOSIS — M80052D Age-related osteoporosis with current pathological fracture, left femur, subsequent encounter for fracture with routine healing: Secondary | ICD-10-CM | POA: Diagnosis not present

## 2017-02-27 DIAGNOSIS — M80052D Age-related osteoporosis with current pathological fracture, left femur, subsequent encounter for fracture with routine healing: Secondary | ICD-10-CM | POA: Diagnosis not present

## 2017-02-27 DIAGNOSIS — G40919 Epilepsy, unspecified, intractable, without status epilepticus: Secondary | ICD-10-CM | POA: Diagnosis not present

## 2017-02-27 DIAGNOSIS — I10 Essential (primary) hypertension: Secondary | ICD-10-CM | POA: Diagnosis not present

## 2017-02-27 DIAGNOSIS — W19XXXD Unspecified fall, subsequent encounter: Secondary | ICD-10-CM | POA: Diagnosis not present

## 2017-02-27 DIAGNOSIS — M25562 Pain in left knee: Secondary | ICD-10-CM | POA: Diagnosis not present

## 2017-02-27 DIAGNOSIS — Z85828 Personal history of other malignant neoplasm of skin: Secondary | ICD-10-CM | POA: Diagnosis not present

## 2017-02-28 DIAGNOSIS — W19XXXD Unspecified fall, subsequent encounter: Secondary | ICD-10-CM | POA: Diagnosis not present

## 2017-02-28 DIAGNOSIS — M25562 Pain in left knee: Secondary | ICD-10-CM | POA: Diagnosis not present

## 2017-02-28 DIAGNOSIS — Z85828 Personal history of other malignant neoplasm of skin: Secondary | ICD-10-CM | POA: Diagnosis not present

## 2017-02-28 DIAGNOSIS — I10 Essential (primary) hypertension: Secondary | ICD-10-CM | POA: Diagnosis not present

## 2017-02-28 DIAGNOSIS — G40919 Epilepsy, unspecified, intractable, without status epilepticus: Secondary | ICD-10-CM | POA: Diagnosis not present

## 2017-02-28 DIAGNOSIS — M80052D Age-related osteoporosis with current pathological fracture, left femur, subsequent encounter for fracture with routine healing: Secondary | ICD-10-CM | POA: Diagnosis not present

## 2017-02-28 DIAGNOSIS — G40209 Localization-related (focal) (partial) symptomatic epilepsy and epileptic syndromes with complex partial seizures, not intractable, without status epilepticus: Secondary | ICD-10-CM | POA: Diagnosis not present

## 2017-03-03 DIAGNOSIS — Z85828 Personal history of other malignant neoplasm of skin: Secondary | ICD-10-CM | POA: Diagnosis not present

## 2017-03-03 DIAGNOSIS — I10 Essential (primary) hypertension: Secondary | ICD-10-CM | POA: Diagnosis not present

## 2017-03-03 DIAGNOSIS — M80052D Age-related osteoporosis with current pathological fracture, left femur, subsequent encounter for fracture with routine healing: Secondary | ICD-10-CM | POA: Diagnosis not present

## 2017-03-03 DIAGNOSIS — M25562 Pain in left knee: Secondary | ICD-10-CM | POA: Diagnosis not present

## 2017-03-03 DIAGNOSIS — W19XXXD Unspecified fall, subsequent encounter: Secondary | ICD-10-CM | POA: Diagnosis not present

## 2017-03-03 DIAGNOSIS — G40919 Epilepsy, unspecified, intractable, without status epilepticus: Secondary | ICD-10-CM | POA: Diagnosis not present

## 2017-03-07 DIAGNOSIS — G40919 Epilepsy, unspecified, intractable, without status epilepticus: Secondary | ICD-10-CM | POA: Diagnosis not present

## 2017-03-07 DIAGNOSIS — I10 Essential (primary) hypertension: Secondary | ICD-10-CM | POA: Diagnosis not present

## 2017-03-07 DIAGNOSIS — Z85828 Personal history of other malignant neoplasm of skin: Secondary | ICD-10-CM | POA: Diagnosis not present

## 2017-03-07 DIAGNOSIS — W19XXXD Unspecified fall, subsequent encounter: Secondary | ICD-10-CM | POA: Diagnosis not present

## 2017-03-07 DIAGNOSIS — M25562 Pain in left knee: Secondary | ICD-10-CM | POA: Diagnosis not present

## 2017-03-07 DIAGNOSIS — M80052D Age-related osteoporosis with current pathological fracture, left femur, subsequent encounter for fracture with routine healing: Secondary | ICD-10-CM | POA: Diagnosis not present

## 2017-03-08 DIAGNOSIS — R55 Syncope and collapse: Secondary | ICD-10-CM | POA: Diagnosis not present

## 2017-03-08 DIAGNOSIS — R413 Other amnesia: Secondary | ICD-10-CM | POA: Diagnosis not present

## 2017-03-08 DIAGNOSIS — R410 Disorientation, unspecified: Secondary | ICD-10-CM | POA: Diagnosis not present

## 2017-03-08 DIAGNOSIS — S0990XA Unspecified injury of head, initial encounter: Secondary | ICD-10-CM | POA: Diagnosis not present

## 2017-03-08 DIAGNOSIS — R569 Unspecified convulsions: Secondary | ICD-10-CM | POA: Diagnosis not present

## 2017-03-09 DIAGNOSIS — Z85828 Personal history of other malignant neoplasm of skin: Secondary | ICD-10-CM | POA: Diagnosis not present

## 2017-03-09 DIAGNOSIS — M80052D Age-related osteoporosis with current pathological fracture, left femur, subsequent encounter for fracture with routine healing: Secondary | ICD-10-CM | POA: Diagnosis not present

## 2017-03-09 DIAGNOSIS — I10 Essential (primary) hypertension: Secondary | ICD-10-CM | POA: Diagnosis not present

## 2017-03-09 DIAGNOSIS — W19XXXD Unspecified fall, subsequent encounter: Secondary | ICD-10-CM | POA: Diagnosis not present

## 2017-03-09 DIAGNOSIS — M25562 Pain in left knee: Secondary | ICD-10-CM | POA: Diagnosis not present

## 2017-03-09 DIAGNOSIS — G40919 Epilepsy, unspecified, intractable, without status epilepticus: Secondary | ICD-10-CM | POA: Diagnosis not present

## 2017-03-20 DIAGNOSIS — M25562 Pain in left knee: Secondary | ICD-10-CM | POA: Diagnosis not present

## 2017-03-20 DIAGNOSIS — Z85828 Personal history of other malignant neoplasm of skin: Secondary | ICD-10-CM | POA: Diagnosis not present

## 2017-03-20 DIAGNOSIS — I10 Essential (primary) hypertension: Secondary | ICD-10-CM | POA: Diagnosis not present

## 2017-03-20 DIAGNOSIS — M80052D Age-related osteoporosis with current pathological fracture, left femur, subsequent encounter for fracture with routine healing: Secondary | ICD-10-CM | POA: Diagnosis not present

## 2017-03-20 DIAGNOSIS — G40919 Epilepsy, unspecified, intractable, without status epilepticus: Secondary | ICD-10-CM | POA: Diagnosis not present

## 2017-03-20 DIAGNOSIS — W19XXXD Unspecified fall, subsequent encounter: Secondary | ICD-10-CM | POA: Diagnosis not present

## 2017-03-23 DIAGNOSIS — I10 Essential (primary) hypertension: Secondary | ICD-10-CM | POA: Diagnosis not present

## 2017-03-23 DIAGNOSIS — W19XXXD Unspecified fall, subsequent encounter: Secondary | ICD-10-CM | POA: Diagnosis not present

## 2017-03-23 DIAGNOSIS — M80052D Age-related osteoporosis with current pathological fracture, left femur, subsequent encounter for fracture with routine healing: Secondary | ICD-10-CM | POA: Diagnosis not present

## 2017-03-23 DIAGNOSIS — G40919 Epilepsy, unspecified, intractable, without status epilepticus: Secondary | ICD-10-CM | POA: Diagnosis not present

## 2017-03-23 DIAGNOSIS — M25562 Pain in left knee: Secondary | ICD-10-CM | POA: Diagnosis not present

## 2017-03-23 DIAGNOSIS — Z85828 Personal history of other malignant neoplasm of skin: Secondary | ICD-10-CM | POA: Diagnosis not present

## 2017-03-27 DIAGNOSIS — H353131 Nonexudative age-related macular degeneration, bilateral, early dry stage: Secondary | ICD-10-CM | POA: Diagnosis not present

## 2017-03-27 DIAGNOSIS — H04123 Dry eye syndrome of bilateral lacrimal glands: Secondary | ICD-10-CM | POA: Diagnosis not present

## 2017-03-28 DIAGNOSIS — M80052D Age-related osteoporosis with current pathological fracture, left femur, subsequent encounter for fracture with routine healing: Secondary | ICD-10-CM | POA: Diagnosis not present

## 2017-03-28 DIAGNOSIS — I10 Essential (primary) hypertension: Secondary | ICD-10-CM | POA: Diagnosis not present

## 2017-03-28 DIAGNOSIS — Z85828 Personal history of other malignant neoplasm of skin: Secondary | ICD-10-CM | POA: Diagnosis not present

## 2017-03-28 DIAGNOSIS — G40919 Epilepsy, unspecified, intractable, without status epilepticus: Secondary | ICD-10-CM | POA: Diagnosis not present

## 2017-03-28 DIAGNOSIS — M25562 Pain in left knee: Secondary | ICD-10-CM | POA: Diagnosis not present

## 2017-03-28 DIAGNOSIS — W19XXXD Unspecified fall, subsequent encounter: Secondary | ICD-10-CM | POA: Diagnosis not present

## 2017-03-30 DIAGNOSIS — G40919 Epilepsy, unspecified, intractable, without status epilepticus: Secondary | ICD-10-CM | POA: Diagnosis not present

## 2017-03-30 DIAGNOSIS — Z85828 Personal history of other malignant neoplasm of skin: Secondary | ICD-10-CM | POA: Diagnosis not present

## 2017-03-30 DIAGNOSIS — W19XXXD Unspecified fall, subsequent encounter: Secondary | ICD-10-CM | POA: Diagnosis not present

## 2017-03-30 DIAGNOSIS — M25562 Pain in left knee: Secondary | ICD-10-CM | POA: Diagnosis not present

## 2017-03-30 DIAGNOSIS — M80052D Age-related osteoporosis with current pathological fracture, left femur, subsequent encounter for fracture with routine healing: Secondary | ICD-10-CM | POA: Diagnosis not present

## 2017-03-30 DIAGNOSIS — I10 Essential (primary) hypertension: Secondary | ICD-10-CM | POA: Diagnosis not present

## 2017-04-04 DIAGNOSIS — R2681 Unsteadiness on feet: Secondary | ICD-10-CM | POA: Diagnosis not present

## 2017-04-04 DIAGNOSIS — E785 Hyperlipidemia, unspecified: Secondary | ICD-10-CM | POA: Diagnosis not present

## 2017-04-04 DIAGNOSIS — G40309 Generalized idiopathic epilepsy and epileptic syndromes, not intractable, without status epilepticus: Secondary | ICD-10-CM | POA: Diagnosis not present

## 2017-04-07 DIAGNOSIS — Z85828 Personal history of other malignant neoplasm of skin: Secondary | ICD-10-CM | POA: Diagnosis not present

## 2017-04-07 DIAGNOSIS — I10 Essential (primary) hypertension: Secondary | ICD-10-CM | POA: Diagnosis not present

## 2017-04-07 DIAGNOSIS — W19XXXD Unspecified fall, subsequent encounter: Secondary | ICD-10-CM | POA: Diagnosis not present

## 2017-04-07 DIAGNOSIS — M25562 Pain in left knee: Secondary | ICD-10-CM | POA: Diagnosis not present

## 2017-04-07 DIAGNOSIS — M80052D Age-related osteoporosis with current pathological fracture, left femur, subsequent encounter for fracture with routine healing: Secondary | ICD-10-CM | POA: Diagnosis not present

## 2017-04-07 DIAGNOSIS — G40919 Epilepsy, unspecified, intractable, without status epilepticus: Secondary | ICD-10-CM | POA: Diagnosis not present

## 2017-04-12 DIAGNOSIS — Z85828 Personal history of other malignant neoplasm of skin: Secondary | ICD-10-CM | POA: Diagnosis not present

## 2017-04-12 DIAGNOSIS — M25562 Pain in left knee: Secondary | ICD-10-CM | POA: Diagnosis not present

## 2017-04-12 DIAGNOSIS — G40919 Epilepsy, unspecified, intractable, without status epilepticus: Secondary | ICD-10-CM | POA: Diagnosis not present

## 2017-04-12 DIAGNOSIS — M80052D Age-related osteoporosis with current pathological fracture, left femur, subsequent encounter for fracture with routine healing: Secondary | ICD-10-CM | POA: Diagnosis not present

## 2017-04-12 DIAGNOSIS — I10 Essential (primary) hypertension: Secondary | ICD-10-CM | POA: Diagnosis not present

## 2017-04-12 DIAGNOSIS — W19XXXD Unspecified fall, subsequent encounter: Secondary | ICD-10-CM | POA: Diagnosis not present

## 2017-04-19 DIAGNOSIS — L821 Other seborrheic keratosis: Secondary | ICD-10-CM | POA: Diagnosis not present

## 2017-04-19 DIAGNOSIS — L578 Other skin changes due to chronic exposure to nonionizing radiation: Secondary | ICD-10-CM | POA: Diagnosis not present

## 2017-04-19 DIAGNOSIS — L570 Actinic keratosis: Secondary | ICD-10-CM | POA: Diagnosis not present

## 2017-04-19 DIAGNOSIS — Z85828 Personal history of other malignant neoplasm of skin: Secondary | ICD-10-CM | POA: Diagnosis not present

## 2017-04-19 DIAGNOSIS — L304 Erythema intertrigo: Secondary | ICD-10-CM | POA: Diagnosis not present

## 2017-04-25 DIAGNOSIS — S72452D Displaced supracondylar fracture without intracondylar extension of lower end of left femur, subsequent encounter for closed fracture with routine healing: Secondary | ICD-10-CM | POA: Diagnosis not present

## 2017-05-31 DIAGNOSIS — L570 Actinic keratosis: Secondary | ICD-10-CM | POA: Diagnosis not present

## 2017-06-27 DIAGNOSIS — Z23 Encounter for immunization: Secondary | ICD-10-CM | POA: Diagnosis not present

## 2017-07-27 DIAGNOSIS — D485 Neoplasm of uncertain behavior of skin: Secondary | ICD-10-CM | POA: Diagnosis not present

## 2017-07-27 DIAGNOSIS — L570 Actinic keratosis: Secondary | ICD-10-CM | POA: Diagnosis not present

## 2017-07-27 DIAGNOSIS — L821 Other seborrheic keratosis: Secondary | ICD-10-CM | POA: Diagnosis not present

## 2017-09-21 DIAGNOSIS — L304 Erythema intertrigo: Secondary | ICD-10-CM | POA: Diagnosis not present

## 2017-09-21 DIAGNOSIS — L111 Transient acantholytic dermatosis [Grover]: Secondary | ICD-10-CM | POA: Diagnosis not present

## 2017-10-09 DIAGNOSIS — B309 Viral conjunctivitis, unspecified: Secondary | ICD-10-CM | POA: Diagnosis not present

## 2017-10-25 DIAGNOSIS — L570 Actinic keratosis: Secondary | ICD-10-CM | POA: Diagnosis not present

## 2017-10-25 DIAGNOSIS — L304 Erythema intertrigo: Secondary | ICD-10-CM | POA: Diagnosis not present

## 2017-10-25 DIAGNOSIS — D1801 Hemangioma of skin and subcutaneous tissue: Secondary | ICD-10-CM | POA: Diagnosis not present

## 2017-10-25 DIAGNOSIS — Z85828 Personal history of other malignant neoplasm of skin: Secondary | ICD-10-CM | POA: Diagnosis not present

## 2017-12-27 DIAGNOSIS — R569 Unspecified convulsions: Secondary | ICD-10-CM | POA: Diagnosis not present

## 2017-12-27 DIAGNOSIS — G40909 Epilepsy, unspecified, not intractable, without status epilepticus: Secondary | ICD-10-CM | POA: Diagnosis not present

## 2017-12-27 DIAGNOSIS — M47812 Spondylosis without myelopathy or radiculopathy, cervical region: Secondary | ICD-10-CM | POA: Diagnosis not present

## 2017-12-27 DIAGNOSIS — Z9114 Patient's other noncompliance with medication regimen: Secondary | ICD-10-CM | POA: Diagnosis not present

## 2017-12-27 DIAGNOSIS — M50322 Other cervical disc degeneration at C5-C6 level: Secondary | ICD-10-CM | POA: Diagnosis not present

## 2017-12-27 DIAGNOSIS — Z79899 Other long term (current) drug therapy: Secondary | ICD-10-CM | POA: Diagnosis not present

## 2017-12-27 DIAGNOSIS — Z85828 Personal history of other malignant neoplasm of skin: Secondary | ICD-10-CM | POA: Diagnosis not present

## 2018-01-08 DIAGNOSIS — G40209 Localization-related (focal) (partial) symptomatic epilepsy and epileptic syndromes with complex partial seizures, not intractable, without status epilepticus: Secondary | ICD-10-CM | POA: Diagnosis not present

## 2018-01-09 DIAGNOSIS — E559 Vitamin D deficiency, unspecified: Secondary | ICD-10-CM | POA: Diagnosis not present

## 2018-01-09 DIAGNOSIS — M6281 Muscle weakness (generalized): Secondary | ICD-10-CM | POA: Diagnosis not present

## 2018-01-09 DIAGNOSIS — G3184 Mild cognitive impairment, so stated: Secondary | ICD-10-CM | POA: Diagnosis not present

## 2018-01-09 DIAGNOSIS — Z602 Problems related to living alone: Secondary | ICD-10-CM | POA: Diagnosis not present

## 2018-01-09 DIAGNOSIS — N390 Urinary tract infection, site not specified: Secondary | ICD-10-CM | POA: Diagnosis not present

## 2018-01-09 DIAGNOSIS — G40309 Generalized idiopathic epilepsy and epileptic syndromes, not intractable, without status epilepticus: Secondary | ICD-10-CM | POA: Diagnosis not present

## 2018-01-29 DIAGNOSIS — N390 Urinary tract infection, site not specified: Secondary | ICD-10-CM | POA: Diagnosis not present

## 2018-04-03 DIAGNOSIS — H353131 Nonexudative age-related macular degeneration, bilateral, early dry stage: Secondary | ICD-10-CM | POA: Diagnosis not present

## 2018-04-17 DIAGNOSIS — Z8781 Personal history of (healed) traumatic fracture: Secondary | ICD-10-CM | POA: Diagnosis not present

## 2018-04-17 DIAGNOSIS — Z602 Problems related to living alone: Secondary | ICD-10-CM | POA: Diagnosis not present

## 2018-04-17 DIAGNOSIS — C4490 Unspecified malignant neoplasm of skin, unspecified: Secondary | ICD-10-CM | POA: Diagnosis not present

## 2018-04-17 DIAGNOSIS — I110 Hypertensive heart disease with heart failure: Secondary | ICD-10-CM | POA: Diagnosis not present

## 2018-04-17 DIAGNOSIS — Z9119 Patient's noncompliance with other medical treatment and regimen: Secondary | ICD-10-CM | POA: Diagnosis not present

## 2018-04-17 DIAGNOSIS — G3184 Mild cognitive impairment, so stated: Secondary | ICD-10-CM | POA: Diagnosis not present

## 2018-04-17 DIAGNOSIS — E785 Hyperlipidemia, unspecified: Secondary | ICD-10-CM | POA: Diagnosis not present

## 2018-04-17 DIAGNOSIS — N390 Urinary tract infection, site not specified: Secondary | ICD-10-CM | POA: Diagnosis not present

## 2018-04-17 DIAGNOSIS — M1990 Unspecified osteoarthritis, unspecified site: Secondary | ICD-10-CM | POA: Diagnosis not present

## 2018-04-17 DIAGNOSIS — H3530 Unspecified macular degeneration: Secondary | ICD-10-CM | POA: Diagnosis not present

## 2018-04-17 DIAGNOSIS — G40309 Generalized idiopathic epilepsy and epileptic syndromes, not intractable, without status epilepticus: Secondary | ICD-10-CM | POA: Diagnosis not present

## 2018-04-17 DIAGNOSIS — E559 Vitamin D deficiency, unspecified: Secondary | ICD-10-CM | POA: Diagnosis not present

## 2018-04-19 DIAGNOSIS — Z602 Problems related to living alone: Secondary | ICD-10-CM | POA: Diagnosis not present

## 2018-04-19 DIAGNOSIS — G40309 Generalized idiopathic epilepsy and epileptic syndromes, not intractable, without status epilepticus: Secondary | ICD-10-CM | POA: Diagnosis not present

## 2018-04-19 DIAGNOSIS — M6281 Muscle weakness (generalized): Secondary | ICD-10-CM | POA: Diagnosis not present

## 2018-04-19 DIAGNOSIS — N390 Urinary tract infection, site not specified: Secondary | ICD-10-CM | POA: Diagnosis not present

## 2018-04-19 DIAGNOSIS — E785 Hyperlipidemia, unspecified: Secondary | ICD-10-CM | POA: Diagnosis not present

## 2018-04-19 DIAGNOSIS — I119 Hypertensive heart disease without heart failure: Secondary | ICD-10-CM | POA: Diagnosis not present

## 2018-04-19 LAB — HEPATIC FUNCTION PANEL
ALT: 17 (ref 7–35)
AST: 17 (ref 13–35)
Alkaline Phosphatase: 129 — AB (ref 25–125)
BILIRUBIN, TOTAL: 0.2

## 2018-04-19 LAB — BASIC METABOLIC PANEL
BUN: 18 (ref 4–21)
Creatinine: 0.9 (ref <=1.1)
Glucose: 95
Potassium: 4.8 (ref 3.4–5.3)
Sodium: 141 (ref 137–147)

## 2018-04-19 LAB — CBC AND DIFFERENTIAL
HCT: 40 (ref 36–46)
Hemoglobin: 13.2 (ref 12.0–16.0)
Platelets: 286 (ref 150–399)
WBC: 9.3

## 2018-04-20 DIAGNOSIS — E785 Hyperlipidemia, unspecified: Secondary | ICD-10-CM | POA: Diagnosis not present

## 2018-04-20 DIAGNOSIS — H3530 Unspecified macular degeneration: Secondary | ICD-10-CM | POA: Diagnosis not present

## 2018-04-20 DIAGNOSIS — N390 Urinary tract infection, site not specified: Secondary | ICD-10-CM | POA: Diagnosis not present

## 2018-04-20 DIAGNOSIS — I110 Hypertensive heart disease with heart failure: Secondary | ICD-10-CM | POA: Diagnosis not present

## 2018-04-20 DIAGNOSIS — G40309 Generalized idiopathic epilepsy and epileptic syndromes, not intractable, without status epilepticus: Secondary | ICD-10-CM | POA: Diagnosis not present

## 2018-04-20 DIAGNOSIS — E559 Vitamin D deficiency, unspecified: Secondary | ICD-10-CM | POA: Diagnosis not present

## 2018-04-23 DIAGNOSIS — H3530 Unspecified macular degeneration: Secondary | ICD-10-CM | POA: Diagnosis not present

## 2018-04-23 DIAGNOSIS — G40309 Generalized idiopathic epilepsy and epileptic syndromes, not intractable, without status epilepticus: Secondary | ICD-10-CM | POA: Diagnosis not present

## 2018-04-23 DIAGNOSIS — E559 Vitamin D deficiency, unspecified: Secondary | ICD-10-CM | POA: Diagnosis not present

## 2018-04-23 DIAGNOSIS — I110 Hypertensive heart disease with heart failure: Secondary | ICD-10-CM | POA: Diagnosis not present

## 2018-04-23 DIAGNOSIS — E785 Hyperlipidemia, unspecified: Secondary | ICD-10-CM | POA: Diagnosis not present

## 2018-04-23 DIAGNOSIS — N390 Urinary tract infection, site not specified: Secondary | ICD-10-CM | POA: Diagnosis not present

## 2018-04-26 DIAGNOSIS — E559 Vitamin D deficiency, unspecified: Secondary | ICD-10-CM | POA: Diagnosis not present

## 2018-04-26 DIAGNOSIS — E785 Hyperlipidemia, unspecified: Secondary | ICD-10-CM | POA: Diagnosis not present

## 2018-04-26 DIAGNOSIS — H3530 Unspecified macular degeneration: Secondary | ICD-10-CM | POA: Diagnosis not present

## 2018-04-26 DIAGNOSIS — G40309 Generalized idiopathic epilepsy and epileptic syndromes, not intractable, without status epilepticus: Secondary | ICD-10-CM | POA: Diagnosis not present

## 2018-04-26 DIAGNOSIS — N390 Urinary tract infection, site not specified: Secondary | ICD-10-CM | POA: Diagnosis not present

## 2018-04-26 DIAGNOSIS — I110 Hypertensive heart disease with heart failure: Secondary | ICD-10-CM | POA: Diagnosis not present

## 2018-05-01 DIAGNOSIS — E785 Hyperlipidemia, unspecified: Secondary | ICD-10-CM | POA: Diagnosis not present

## 2018-05-01 DIAGNOSIS — G40309 Generalized idiopathic epilepsy and epileptic syndromes, not intractable, without status epilepticus: Secondary | ICD-10-CM | POA: Diagnosis not present

## 2018-05-01 DIAGNOSIS — I110 Hypertensive heart disease with heart failure: Secondary | ICD-10-CM | POA: Diagnosis not present

## 2018-05-01 DIAGNOSIS — E559 Vitamin D deficiency, unspecified: Secondary | ICD-10-CM | POA: Diagnosis not present

## 2018-05-01 DIAGNOSIS — N390 Urinary tract infection, site not specified: Secondary | ICD-10-CM | POA: Diagnosis not present

## 2018-05-01 DIAGNOSIS — H3530 Unspecified macular degeneration: Secondary | ICD-10-CM | POA: Diagnosis not present

## 2018-05-03 DIAGNOSIS — E559 Vitamin D deficiency, unspecified: Secondary | ICD-10-CM | POA: Diagnosis not present

## 2018-05-03 DIAGNOSIS — I110 Hypertensive heart disease with heart failure: Secondary | ICD-10-CM | POA: Diagnosis not present

## 2018-05-03 DIAGNOSIS — G40309 Generalized idiopathic epilepsy and epileptic syndromes, not intractable, without status epilepticus: Secondary | ICD-10-CM | POA: Diagnosis not present

## 2018-05-03 DIAGNOSIS — H3530 Unspecified macular degeneration: Secondary | ICD-10-CM | POA: Diagnosis not present

## 2018-05-03 DIAGNOSIS — N390 Urinary tract infection, site not specified: Secondary | ICD-10-CM | POA: Diagnosis not present

## 2018-05-03 DIAGNOSIS — E785 Hyperlipidemia, unspecified: Secondary | ICD-10-CM | POA: Diagnosis not present

## 2018-05-20 DIAGNOSIS — Z9049 Acquired absence of other specified parts of digestive tract: Secondary | ICD-10-CM | POA: Diagnosis not present

## 2018-05-20 DIAGNOSIS — Z90710 Acquired absence of both cervix and uterus: Secondary | ICD-10-CM | POA: Diagnosis not present

## 2018-05-20 DIAGNOSIS — Z87440 Personal history of urinary (tract) infections: Secondary | ICD-10-CM | POA: Diagnosis not present

## 2018-05-20 DIAGNOSIS — Z9089 Acquired absence of other organs: Secondary | ICD-10-CM | POA: Diagnosis not present

## 2018-05-20 DIAGNOSIS — Z859 Personal history of malignant neoplasm, unspecified: Secondary | ICD-10-CM | POA: Diagnosis not present

## 2018-05-20 DIAGNOSIS — R569 Unspecified convulsions: Secondary | ICD-10-CM | POA: Diagnosis not present

## 2018-06-21 DIAGNOSIS — L570 Actinic keratosis: Secondary | ICD-10-CM | POA: Diagnosis not present

## 2018-06-21 DIAGNOSIS — L821 Other seborrheic keratosis: Secondary | ICD-10-CM | POA: Diagnosis not present

## 2018-06-21 DIAGNOSIS — L578 Other skin changes due to chronic exposure to nonionizing radiation: Secondary | ICD-10-CM | POA: Diagnosis not present

## 2018-06-21 DIAGNOSIS — L820 Inflamed seborrheic keratosis: Secondary | ICD-10-CM | POA: Diagnosis not present

## 2018-06-23 DIAGNOSIS — Z23 Encounter for immunization: Secondary | ICD-10-CM | POA: Diagnosis not present

## 2018-07-10 DIAGNOSIS — G40209 Localization-related (focal) (partial) symptomatic epilepsy and epileptic syndromes with complex partial seizures, not intractable, without status epilepticus: Secondary | ICD-10-CM | POA: Diagnosis not present

## 2018-07-19 DIAGNOSIS — L570 Actinic keratosis: Secondary | ICD-10-CM | POA: Diagnosis not present

## 2018-07-19 DIAGNOSIS — Z85828 Personal history of other malignant neoplasm of skin: Secondary | ICD-10-CM | POA: Diagnosis not present

## 2018-08-02 DIAGNOSIS — H353131 Nonexudative age-related macular degeneration, bilateral, early dry stage: Secondary | ICD-10-CM | POA: Diagnosis not present

## 2018-08-21 DIAGNOSIS — M6281 Muscle weakness (generalized): Secondary | ICD-10-CM | POA: Diagnosis not present

## 2018-08-21 DIAGNOSIS — R419 Unspecified symptoms and signs involving cognitive functions and awareness: Secondary | ICD-10-CM | POA: Diagnosis not present

## 2018-08-21 DIAGNOSIS — Z9181 History of falling: Secondary | ICD-10-CM | POA: Diagnosis not present

## 2018-08-21 DIAGNOSIS — R2681 Unsteadiness on feet: Secondary | ICD-10-CM | POA: Diagnosis not present

## 2018-08-21 DIAGNOSIS — N3941 Urge incontinence: Secondary | ICD-10-CM | POA: Diagnosis not present

## 2018-08-21 DIAGNOSIS — R41841 Cognitive communication deficit: Secondary | ICD-10-CM | POA: Diagnosis not present

## 2018-08-21 DIAGNOSIS — Z7389 Other problems related to life management difficulty: Secondary | ICD-10-CM | POA: Diagnosis not present

## 2018-08-22 DIAGNOSIS — Z7389 Other problems related to life management difficulty: Secondary | ICD-10-CM | POA: Diagnosis not present

## 2018-08-22 DIAGNOSIS — R2681 Unsteadiness on feet: Secondary | ICD-10-CM | POA: Diagnosis not present

## 2018-08-22 DIAGNOSIS — N3941 Urge incontinence: Secondary | ICD-10-CM | POA: Diagnosis not present

## 2018-08-22 DIAGNOSIS — R41841 Cognitive communication deficit: Secondary | ICD-10-CM | POA: Diagnosis not present

## 2018-08-22 DIAGNOSIS — Z9181 History of falling: Secondary | ICD-10-CM | POA: Diagnosis not present

## 2018-08-22 DIAGNOSIS — M6281 Muscle weakness (generalized): Secondary | ICD-10-CM | POA: Diagnosis not present

## 2018-08-24 DIAGNOSIS — M6281 Muscle weakness (generalized): Secondary | ICD-10-CM | POA: Diagnosis not present

## 2018-08-24 DIAGNOSIS — Z9181 History of falling: Secondary | ICD-10-CM | POA: Diagnosis not present

## 2018-08-24 DIAGNOSIS — N3941 Urge incontinence: Secondary | ICD-10-CM | POA: Diagnosis not present

## 2018-08-24 DIAGNOSIS — R2681 Unsteadiness on feet: Secondary | ICD-10-CM | POA: Diagnosis not present

## 2018-08-24 DIAGNOSIS — Z7389 Other problems related to life management difficulty: Secondary | ICD-10-CM | POA: Diagnosis not present

## 2018-08-24 DIAGNOSIS — R41841 Cognitive communication deficit: Secondary | ICD-10-CM | POA: Diagnosis not present

## 2018-08-28 DIAGNOSIS — Z7389 Other problems related to life management difficulty: Secondary | ICD-10-CM | POA: Diagnosis not present

## 2018-08-28 DIAGNOSIS — M6281 Muscle weakness (generalized): Secondary | ICD-10-CM | POA: Diagnosis not present

## 2018-08-28 DIAGNOSIS — Z9181 History of falling: Secondary | ICD-10-CM | POA: Diagnosis not present

## 2018-08-28 DIAGNOSIS — R41841 Cognitive communication deficit: Secondary | ICD-10-CM | POA: Diagnosis not present

## 2018-08-28 DIAGNOSIS — N3941 Urge incontinence: Secondary | ICD-10-CM | POA: Diagnosis not present

## 2018-08-28 DIAGNOSIS — R2681 Unsteadiness on feet: Secondary | ICD-10-CM | POA: Diagnosis not present

## 2018-08-30 DIAGNOSIS — Z9181 History of falling: Secondary | ICD-10-CM | POA: Diagnosis not present

## 2018-08-30 DIAGNOSIS — R41841 Cognitive communication deficit: Secondary | ICD-10-CM | POA: Diagnosis not present

## 2018-08-30 DIAGNOSIS — N3941 Urge incontinence: Secondary | ICD-10-CM | POA: Diagnosis not present

## 2018-08-30 DIAGNOSIS — R2681 Unsteadiness on feet: Secondary | ICD-10-CM | POA: Diagnosis not present

## 2018-08-30 DIAGNOSIS — Z7389 Other problems related to life management difficulty: Secondary | ICD-10-CM | POA: Diagnosis not present

## 2018-08-30 DIAGNOSIS — M6281 Muscle weakness (generalized): Secondary | ICD-10-CM | POA: Diagnosis not present

## 2018-08-31 DIAGNOSIS — N3941 Urge incontinence: Secondary | ICD-10-CM | POA: Diagnosis not present

## 2018-08-31 DIAGNOSIS — R41841 Cognitive communication deficit: Secondary | ICD-10-CM | POA: Diagnosis not present

## 2018-08-31 DIAGNOSIS — M6281 Muscle weakness (generalized): Secondary | ICD-10-CM | POA: Diagnosis not present

## 2018-08-31 DIAGNOSIS — R2681 Unsteadiness on feet: Secondary | ICD-10-CM | POA: Diagnosis not present

## 2018-08-31 DIAGNOSIS — Z7389 Other problems related to life management difficulty: Secondary | ICD-10-CM | POA: Diagnosis not present

## 2018-08-31 DIAGNOSIS — Z9181 History of falling: Secondary | ICD-10-CM | POA: Diagnosis not present

## 2018-09-08 DIAGNOSIS — N3941 Urge incontinence: Secondary | ICD-10-CM | POA: Diagnosis not present

## 2018-09-08 DIAGNOSIS — Z7389 Other problems related to life management difficulty: Secondary | ICD-10-CM | POA: Diagnosis not present

## 2018-09-08 DIAGNOSIS — R41841 Cognitive communication deficit: Secondary | ICD-10-CM | POA: Diagnosis not present

## 2018-09-08 DIAGNOSIS — R2681 Unsteadiness on feet: Secondary | ICD-10-CM | POA: Diagnosis not present

## 2018-09-08 DIAGNOSIS — M6281 Muscle weakness (generalized): Secondary | ICD-10-CM | POA: Diagnosis not present

## 2018-09-08 DIAGNOSIS — Z9181 History of falling: Secondary | ICD-10-CM | POA: Diagnosis not present

## 2018-09-11 DIAGNOSIS — Z9181 History of falling: Secondary | ICD-10-CM | POA: Diagnosis not present

## 2018-09-11 DIAGNOSIS — M6281 Muscle weakness (generalized): Secondary | ICD-10-CM | POA: Diagnosis not present

## 2018-09-11 DIAGNOSIS — N3941 Urge incontinence: Secondary | ICD-10-CM | POA: Diagnosis not present

## 2018-09-11 DIAGNOSIS — Z7389 Other problems related to life management difficulty: Secondary | ICD-10-CM | POA: Diagnosis not present

## 2018-09-11 DIAGNOSIS — R41841 Cognitive communication deficit: Secondary | ICD-10-CM | POA: Diagnosis not present

## 2018-09-11 DIAGNOSIS — R2681 Unsteadiness on feet: Secondary | ICD-10-CM | POA: Diagnosis not present

## 2018-09-13 DIAGNOSIS — M6281 Muscle weakness (generalized): Secondary | ICD-10-CM | POA: Diagnosis not present

## 2018-09-13 DIAGNOSIS — R2681 Unsteadiness on feet: Secondary | ICD-10-CM | POA: Diagnosis not present

## 2018-09-13 DIAGNOSIS — N3941 Urge incontinence: Secondary | ICD-10-CM | POA: Diagnosis not present

## 2018-09-13 DIAGNOSIS — R41841 Cognitive communication deficit: Secondary | ICD-10-CM | POA: Diagnosis not present

## 2018-09-13 DIAGNOSIS — Z9181 History of falling: Secondary | ICD-10-CM | POA: Diagnosis not present

## 2018-09-13 DIAGNOSIS — Z7389 Other problems related to life management difficulty: Secondary | ICD-10-CM | POA: Diagnosis not present

## 2018-09-14 DIAGNOSIS — N3941 Urge incontinence: Secondary | ICD-10-CM | POA: Diagnosis not present

## 2018-09-14 DIAGNOSIS — R2681 Unsteadiness on feet: Secondary | ICD-10-CM | POA: Diagnosis not present

## 2018-09-14 DIAGNOSIS — Z7389 Other problems related to life management difficulty: Secondary | ICD-10-CM | POA: Diagnosis not present

## 2018-09-14 DIAGNOSIS — M6281 Muscle weakness (generalized): Secondary | ICD-10-CM | POA: Diagnosis not present

## 2018-09-14 DIAGNOSIS — R41841 Cognitive communication deficit: Secondary | ICD-10-CM | POA: Diagnosis not present

## 2018-09-14 DIAGNOSIS — Z9181 History of falling: Secondary | ICD-10-CM | POA: Diagnosis not present

## 2018-09-17 ENCOUNTER — Encounter: Payer: Self-pay | Admitting: Nurse Practitioner

## 2018-09-17 ENCOUNTER — Encounter: Payer: Self-pay | Admitting: *Deleted

## 2018-09-17 ENCOUNTER — Non-Acute Institutional Stay: Payer: Medicare Other | Admitting: Nurse Practitioner

## 2018-09-17 DIAGNOSIS — R269 Unspecified abnormalities of gait and mobility: Secondary | ICD-10-CM

## 2018-09-17 DIAGNOSIS — R41841 Cognitive communication deficit: Secondary | ICD-10-CM | POA: Diagnosis not present

## 2018-09-17 DIAGNOSIS — Z7389 Other problems related to life management difficulty: Secondary | ICD-10-CM | POA: Diagnosis not present

## 2018-09-17 DIAGNOSIS — Z8781 Personal history of (healed) traumatic fracture: Secondary | ICD-10-CM | POA: Insufficient documentation

## 2018-09-17 DIAGNOSIS — H353130 Nonexudative age-related macular degeneration, bilateral, stage unspecified: Secondary | ICD-10-CM | POA: Diagnosis not present

## 2018-09-17 DIAGNOSIS — Z9181 History of falling: Secondary | ICD-10-CM | POA: Diagnosis not present

## 2018-09-17 DIAGNOSIS — Z9889 Other specified postprocedural states: Secondary | ICD-10-CM

## 2018-09-17 DIAGNOSIS — Z6836 Body mass index (BMI) 36.0-36.9, adult: Secondary | ICD-10-CM | POA: Diagnosis not present

## 2018-09-17 DIAGNOSIS — G40909 Epilepsy, unspecified, not intractable, without status epilepticus: Secondary | ICD-10-CM | POA: Diagnosis not present

## 2018-09-17 DIAGNOSIS — H04123 Dry eye syndrome of bilateral lacrimal glands: Secondary | ICD-10-CM | POA: Diagnosis not present

## 2018-09-17 DIAGNOSIS — E669 Obesity, unspecified: Secondary | ICD-10-CM | POA: Insufficient documentation

## 2018-09-17 DIAGNOSIS — Z8719 Personal history of other diseases of the digestive system: Secondary | ICD-10-CM

## 2018-09-17 DIAGNOSIS — R2681 Unsteadiness on feet: Secondary | ICD-10-CM | POA: Diagnosis not present

## 2018-09-17 DIAGNOSIS — H3530 Unspecified macular degeneration: Secondary | ICD-10-CM | POA: Insufficient documentation

## 2018-09-17 DIAGNOSIS — N3941 Urge incontinence: Secondary | ICD-10-CM | POA: Diagnosis not present

## 2018-09-17 DIAGNOSIS — M6281 Muscle weakness (generalized): Secondary | ICD-10-CM | POA: Diagnosis not present

## 2018-09-17 DIAGNOSIS — E66811 Obesity, class 1: Secondary | ICD-10-CM | POA: Insufficient documentation

## 2018-09-17 NOTE — Assessment & Plan Note (Signed)
Will encourage the patient with diet and exercise, update TSH, lipid panel.

## 2018-09-17 NOTE — Progress Notes (Signed)
Location:  [place] Room Number: 910 Place of Service:  ALF [phone]) Provider:  Marlana Latus  NP  Enmanuel Zufall X, NP  Patient Care Team: Aleksia Freiman X, NP as PCP - General (Internal Medicine)  Extended Emergency Contact Information Primary Emergency Contact: Atilano Ina Phone: [phone] Relation: Niece  Code Status:  Full Code Goals of care: Advanced Directive information Advanced Directives 09/17/2018  Does Patient Have a Medical Advance Directive? Yes  Type of Advance Directive [place]  Does patient want to make changes to medical advance directive? No - Patient declined     Chief Complaint  Patient presents with  . New Patient (Initial Visit)    HPI:  Pt is a 78 y.o. female seen today for admitted to [facility] service. She has been in [facility] for about a month following pin/repair of the left femur fracture sustained from a fall  a year ago. She ambulates with walker.  Hx of seizures, on Zonisamide 100mg  qd. Hx of macular degeneration, on MVI, able to read with corrective glasses. Dry eyes is chronic, on prn Systane qid.    Past Medical History:  Diagnosis Date  . Cataract 2010   Dr. Alexander Mt, DrJerlyn Ly @ [place]  . Seizures ([place])    Past Surgical History:  Procedure Laterality Date  . ABDOMINAL HYSTERECTOMY  1970  . APPENDECTOMY  1958  . FEMUR SURGERY  2018   Broken    Allergies not on file  Outpatient Encounter Medications as of 09/17/2018  Medication Sig  . Multiple Vitamins-Minerals (ICAPS PO) Take 1 tablet by mouth daily.  . Multiple Vitamins-Minerals (MULTIVITAMIN WITH MINERALS) tablet Take 1 tablet by mouth daily.  Vladimir Faster Glycol-Propyl Glycol (SYSTANE) 0.4-0.3 % SOLN Apply 1 drop to eye 4 (four) times daily as needed (dry eye).  Marland Kitchen zonisamide (ZONEGRAN) 100 MG capsule Take 100 mg by mouth at bedtime.   No facility-administered encounter medications on file as of 09/17/2018.     Review of Systems  Constitutional:  Negative for activity change, appetite change, chills, diaphoresis, fatigue, fever and unexpected weight change.  HENT: Negative for congestion, hearing loss, sinus pressure, trouble swallowing and voice change.   Eyes:       Dry eyes. Corrective lenses. Macular degeneration.   Respiratory: Negative for cough, shortness of breath and wheezing.   Cardiovascular: Positive for leg swelling. Negative for chest pain and palpitations.  Gastrointestinal: Negative for abdominal distention, abdominal pain, constipation, diarrhea and nausea.  Genitourinary: Negative for difficulty urinating, dysuria and urgency.  Musculoskeletal: Positive for arthralgias and gait problem. Negative for back pain.  Skin: Negative for color change and pallor.  Neurological: Negative for dizziness, speech difficulty, weakness and headaches.  Psychiatric/Behavioral: Negative for agitation, behavioral problems, hallucinations and sleep disturbance. The patient is not nervous/anxious.      There is no immunization history on file for this patient. There are no preventive care reminders to display for this patient. No flowsheet data found. Functional Status Survey:    Vitals:   09/17/18 1157  BP: 140/78  Pulse: 76  Resp: 20  Temp: 98.2 F (36.8 C)  SpO2: 95%  Weight: 199 lb 9.6 oz (90.5 kg)  Height: 5\' 2"  (1.575 m)   Body mass index is 36.51 kg/m. Physical Exam Constitutional:      General: She is not in acute distress.    Appearance: Normal appearance. She is obese. She is not ill-appearing, toxic-appearing or diaphoretic.  HENT:  Head: Normocephalic and atraumatic.     Mouth/Throat:     Mouth: Mucous membranes are moist.  Eyes:     Extraocular Movements: Extraocular movements intact.     Pupils: Pupils are equal, round, and reactive to light.  Neck:     Musculoskeletal: Normal range of motion and neck supple. No neck rigidity or muscular tenderness.     Vascular: No carotid bruit.  Cardiovascular:       Rate and Rhythm: Normal rate and regular rhythm.     Heart sounds: No murmur.  Pulmonary:     Effort: Pulmonary effort is normal.     Breath sounds: Normal breath sounds. No wheezing, rhonchi or rales.  Abdominal:     General: There is no distension.     Palpations: Abdomen is soft.     Tenderness: There is no abdominal tenderness. There is no guarding or rebound.  Musculoskeletal:     Right lower leg: Edema present.     Left lower leg: Edema present.     Comments: Trace edema BLE, ambulates with walker.   Skin:    General: Skin is warm and dry.     Coloration: Skin is not pale.     Findings: No erythema.  Neurological:     General: No focal deficit present.     Mental Status: She is alert and oriented to person, place, and time. Mental status is at baseline.     Cranial Nerves: No cranial nerve deficit.     Motor: No weakness.     Coordination: Coordination normal.     Gait: Gait abnormal.  Psychiatric:        Mood and Affect: Mood normal.        Behavior: Behavior normal.        Thought Content: Thought content normal.        Judgment: Judgment normal.     Labs reviewed: No results for input(s): NA, K, CL, CO2, GLUCOSE, BUN, CREATININE, CALCIUM, MG, PHOS in the last 8760 hours. No results for input(s): AST, ALT, ALKPHOS, BILITOT, PROT, ALBUMIN in the last 8760 hours. No results for input(s): WBC, NEUTROABS, HGB, HCT, MCV, PLT in the last 8760 hours. No results found for: TSH No results found for: HGBA1C No results found for: CHOL, HDL, LDLCALC, LDLDIRECT, TRIG, CHOLHDL  Significant Diagnostic Results in last 30 days:  No results found.  Assessment/Plan Gait abnormality Ambulates with walker.   Macular degeneration Able to read with corrective lenses and glasses. Continue MVI  Dry eyes Chronic but livable condition. Continue prn Systane  History of diverticulitis Stable.   Seizure disorder (HCC) Stable, continue Zonisamide 100mg  qd. Will update CBC CMP  TSH lipid panel.   Hx of vertebral fracture repair No back pain today.   Obese Will encourage the patient with diet and exercise, update TSH, lipid panel.      Family/ staff Communication: plan of care reviewed with the patient and charge nurse.   Labs/tests ordered:  CBC CMP lipid panel TSH   Time spend 25 minutes.

## 2018-09-17 NOTE — Assessment & Plan Note (Signed)
Ambulates with walker.  

## 2018-09-17 NOTE — Assessment & Plan Note (Signed)
Able to read with corrective lenses and glasses. Continue MVI

## 2018-09-17 NOTE — Assessment & Plan Note (Addendum)
Stable, continue Zonisamide 100mg  qd. Will update CBC CMP TSH lipid panel.

## 2018-09-17 NOTE — Assessment & Plan Note (Signed)
Stable

## 2018-09-17 NOTE — Assessment & Plan Note (Addendum)
Chronic but livable condition. Continue prn Systane

## 2018-09-17 NOTE — Assessment & Plan Note (Signed)
No back pain today.

## 2018-09-18 DIAGNOSIS — Z7389 Other problems related to life management difficulty: Secondary | ICD-10-CM | POA: Diagnosis not present

## 2018-09-18 DIAGNOSIS — M6281 Muscle weakness (generalized): Secondary | ICD-10-CM | POA: Diagnosis not present

## 2018-09-18 DIAGNOSIS — R41841 Cognitive communication deficit: Secondary | ICD-10-CM | POA: Diagnosis not present

## 2018-09-18 DIAGNOSIS — I10 Essential (primary) hypertension: Secondary | ICD-10-CM | POA: Diagnosis not present

## 2018-09-18 DIAGNOSIS — I119 Hypertensive heart disease without heart failure: Secondary | ICD-10-CM | POA: Diagnosis not present

## 2018-09-18 DIAGNOSIS — G40909 Epilepsy, unspecified, not intractable, without status epilepticus: Secondary | ICD-10-CM | POA: Diagnosis not present

## 2018-09-18 DIAGNOSIS — N3941 Urge incontinence: Secondary | ICD-10-CM | POA: Diagnosis not present

## 2018-09-18 DIAGNOSIS — E785 Hyperlipidemia, unspecified: Secondary | ICD-10-CM | POA: Diagnosis not present

## 2018-09-18 DIAGNOSIS — R2681 Unsteadiness on feet: Secondary | ICD-10-CM | POA: Diagnosis not present

## 2018-09-18 DIAGNOSIS — Z9181 History of falling: Secondary | ICD-10-CM | POA: Diagnosis not present

## 2018-09-19 ENCOUNTER — Encounter: Payer: Self-pay | Admitting: Nurse Practitioner

## 2018-09-19 DIAGNOSIS — E785 Hyperlipidemia, unspecified: Secondary | ICD-10-CM | POA: Insufficient documentation

## 2018-09-19 DIAGNOSIS — R41841 Cognitive communication deficit: Secondary | ICD-10-CM | POA: Diagnosis not present

## 2018-09-19 DIAGNOSIS — Z9181 History of falling: Secondary | ICD-10-CM | POA: Diagnosis not present

## 2018-09-19 DIAGNOSIS — N3941 Urge incontinence: Secondary | ICD-10-CM | POA: Diagnosis not present

## 2018-09-19 DIAGNOSIS — M6281 Muscle weakness (generalized): Secondary | ICD-10-CM | POA: Diagnosis not present

## 2018-09-19 DIAGNOSIS — Z7389 Other problems related to life management difficulty: Secondary | ICD-10-CM | POA: Diagnosis not present

## 2018-09-19 DIAGNOSIS — R2681 Unsteadiness on feet: Secondary | ICD-10-CM | POA: Diagnosis not present

## 2018-09-21 DIAGNOSIS — Z9181 History of falling: Secondary | ICD-10-CM | POA: Diagnosis not present

## 2018-09-21 DIAGNOSIS — R41841 Cognitive communication deficit: Secondary | ICD-10-CM | POA: Diagnosis not present

## 2018-09-21 DIAGNOSIS — N3941 Urge incontinence: Secondary | ICD-10-CM | POA: Diagnosis not present

## 2018-09-21 DIAGNOSIS — M6281 Muscle weakness (generalized): Secondary | ICD-10-CM | POA: Diagnosis not present

## 2018-09-21 DIAGNOSIS — Z7389 Other problems related to life management difficulty: Secondary | ICD-10-CM | POA: Diagnosis not present

## 2018-09-21 DIAGNOSIS — R2681 Unsteadiness on feet: Secondary | ICD-10-CM | POA: Diagnosis not present

## 2018-09-24 DIAGNOSIS — R2681 Unsteadiness on feet: Secondary | ICD-10-CM | POA: Diagnosis not present

## 2018-09-24 DIAGNOSIS — Z7389 Other problems related to life management difficulty: Secondary | ICD-10-CM | POA: Diagnosis not present

## 2018-09-24 DIAGNOSIS — N3941 Urge incontinence: Secondary | ICD-10-CM | POA: Diagnosis not present

## 2018-09-24 DIAGNOSIS — R41841 Cognitive communication deficit: Secondary | ICD-10-CM | POA: Diagnosis not present

## 2018-09-24 DIAGNOSIS — Z9181 History of falling: Secondary | ICD-10-CM | POA: Diagnosis not present

## 2018-09-24 DIAGNOSIS — M6281 Muscle weakness (generalized): Secondary | ICD-10-CM | POA: Diagnosis not present

## 2018-09-25 DIAGNOSIS — N3941 Urge incontinence: Secondary | ICD-10-CM | POA: Diagnosis not present

## 2018-09-25 DIAGNOSIS — M6281 Muscle weakness (generalized): Secondary | ICD-10-CM | POA: Diagnosis not present

## 2018-09-25 DIAGNOSIS — R41841 Cognitive communication deficit: Secondary | ICD-10-CM | POA: Diagnosis not present

## 2018-09-25 DIAGNOSIS — R2681 Unsteadiness on feet: Secondary | ICD-10-CM | POA: Diagnosis not present

## 2018-09-25 DIAGNOSIS — Z9181 History of falling: Secondary | ICD-10-CM | POA: Diagnosis not present

## 2018-09-25 DIAGNOSIS — Z7389 Other problems related to life management difficulty: Secondary | ICD-10-CM | POA: Diagnosis not present

## 2018-09-26 DIAGNOSIS — Z9181 History of falling: Secondary | ICD-10-CM | POA: Diagnosis not present

## 2018-09-26 DIAGNOSIS — R2681 Unsteadiness on feet: Secondary | ICD-10-CM | POA: Diagnosis not present

## 2018-09-26 DIAGNOSIS — N3941 Urge incontinence: Secondary | ICD-10-CM | POA: Diagnosis not present

## 2018-09-26 DIAGNOSIS — R41841 Cognitive communication deficit: Secondary | ICD-10-CM | POA: Diagnosis not present

## 2018-09-26 DIAGNOSIS — M6281 Muscle weakness (generalized): Secondary | ICD-10-CM | POA: Diagnosis not present

## 2018-09-26 DIAGNOSIS — Z7389 Other problems related to life management difficulty: Secondary | ICD-10-CM | POA: Diagnosis not present

## 2018-09-28 DIAGNOSIS — Z9181 History of falling: Secondary | ICD-10-CM | POA: Diagnosis not present

## 2018-09-28 DIAGNOSIS — N3941 Urge incontinence: Secondary | ICD-10-CM | POA: Diagnosis not present

## 2018-09-28 DIAGNOSIS — R2681 Unsteadiness on feet: Secondary | ICD-10-CM | POA: Diagnosis not present

## 2018-09-28 DIAGNOSIS — M6281 Muscle weakness (generalized): Secondary | ICD-10-CM | POA: Diagnosis not present

## 2018-09-28 DIAGNOSIS — Z7389 Other problems related to life management difficulty: Secondary | ICD-10-CM | POA: Diagnosis not present

## 2018-09-28 DIAGNOSIS — R41841 Cognitive communication deficit: Secondary | ICD-10-CM | POA: Diagnosis not present

## 2018-10-01 DIAGNOSIS — R2681 Unsteadiness on feet: Secondary | ICD-10-CM | POA: Diagnosis not present

## 2018-10-01 DIAGNOSIS — Z9181 History of falling: Secondary | ICD-10-CM | POA: Diagnosis not present

## 2018-10-01 DIAGNOSIS — N3941 Urge incontinence: Secondary | ICD-10-CM | POA: Diagnosis not present

## 2018-10-01 DIAGNOSIS — M6281 Muscle weakness (generalized): Secondary | ICD-10-CM | POA: Diagnosis not present

## 2018-10-01 DIAGNOSIS — R41841 Cognitive communication deficit: Secondary | ICD-10-CM | POA: Diagnosis not present

## 2018-10-01 DIAGNOSIS — Z7389 Other problems related to life management difficulty: Secondary | ICD-10-CM | POA: Diagnosis not present

## 2018-10-03 DIAGNOSIS — Z7389 Other problems related to life management difficulty: Secondary | ICD-10-CM | POA: Diagnosis not present

## 2018-10-03 DIAGNOSIS — M6281 Muscle weakness (generalized): Secondary | ICD-10-CM | POA: Diagnosis not present

## 2018-10-03 DIAGNOSIS — N3941 Urge incontinence: Secondary | ICD-10-CM | POA: Diagnosis not present

## 2018-10-03 DIAGNOSIS — R41841 Cognitive communication deficit: Secondary | ICD-10-CM | POA: Diagnosis not present

## 2018-10-03 DIAGNOSIS — Z9181 History of falling: Secondary | ICD-10-CM | POA: Diagnosis not present

## 2018-10-03 DIAGNOSIS — R2681 Unsteadiness on feet: Secondary | ICD-10-CM | POA: Diagnosis not present

## 2018-10-04 DIAGNOSIS — N3941 Urge incontinence: Secondary | ICD-10-CM | POA: Diagnosis not present

## 2018-10-04 DIAGNOSIS — Z9181 History of falling: Secondary | ICD-10-CM | POA: Diagnosis not present

## 2018-10-04 DIAGNOSIS — R41841 Cognitive communication deficit: Secondary | ICD-10-CM | POA: Diagnosis not present

## 2018-10-04 DIAGNOSIS — M6281 Muscle weakness (generalized): Secondary | ICD-10-CM | POA: Diagnosis not present

## 2018-10-04 DIAGNOSIS — Z7389 Other problems related to life management difficulty: Secondary | ICD-10-CM | POA: Diagnosis not present

## 2018-10-04 DIAGNOSIS — R2681 Unsteadiness on feet: Secondary | ICD-10-CM | POA: Diagnosis not present

## 2018-10-05 ENCOUNTER — Encounter: Payer: Self-pay | Admitting: Nurse Practitioner

## 2018-10-05 ENCOUNTER — Non-Acute Institutional Stay: Payer: Medicare Other | Admitting: Nurse Practitioner

## 2018-10-05 DIAGNOSIS — Z7189 Other specified counseling: Secondary | ICD-10-CM | POA: Diagnosis not present

## 2018-10-05 DIAGNOSIS — K560 Paralytic ileus: Secondary | ICD-10-CM | POA: Diagnosis not present

## 2018-10-05 DIAGNOSIS — K219 Gastro-esophageal reflux disease without esophagitis: Secondary | ICD-10-CM | POA: Insufficient documentation

## 2018-10-05 DIAGNOSIS — R3911 Hesitancy of micturition: Secondary | ICD-10-CM | POA: Diagnosis not present

## 2018-10-05 DIAGNOSIS — R109 Unspecified abdominal pain: Secondary | ICD-10-CM | POA: Diagnosis not present

## 2018-10-05 DIAGNOSIS — R300 Dysuria: Secondary | ICD-10-CM | POA: Diagnosis not present

## 2018-10-05 DIAGNOSIS — R197 Diarrhea, unspecified: Secondary | ICD-10-CM | POA: Insufficient documentation

## 2018-10-05 DIAGNOSIS — R509 Fever, unspecified: Secondary | ICD-10-CM | POA: Diagnosis not present

## 2018-10-05 DIAGNOSIS — N390 Urinary tract infection, site not specified: Secondary | ICD-10-CM | POA: Diagnosis not present

## 2018-10-05 LAB — BASIC METABOLIC PANEL
BUN: 20 (ref 4–21)
Creatinine: 0.9 (ref <=1.1)
GLUCOSE: 92
Potassium: 4.1 (ref 3.4–5.3)
SODIUM: 141 (ref 137–147)

## 2018-10-05 LAB — HEPATIC FUNCTION PANEL
ALT: 19 (ref 7–35)
AST: 18 (ref 13–35)
Alkaline Phosphatase: 96 (ref 25–125)
Bilirubin, Total: 0.3

## 2018-10-05 LAB — CBC AND DIFFERENTIAL
HCT: 40 (ref 36–46)
Hemoglobin: 13.4 (ref 12.0–16.0)
Platelets: 275 (ref 150–399)

## 2018-10-05 NOTE — Progress Notes (Signed)
Location[facility] Room Number: 765 Place of Service:  ALF (13) Provider: Marlana Latus NP  Yamilee Harmes X, NP  Patient Care Team: Christophe Rising X, NP as PCP - General (Internal Medicine)  Extended Emergency Contact Information Primary Emergency Contact: Koeppen,James Address: [place] [address] [place] Phone: [phone] Relation: Son Secondary Emergency Contact: Bauch,Caroline Address: [place] [address] [place] Phone: [phone] Work Phone: [phone] Mobile Phone: [phone] Relation: Daughter  Code Status: DNR Goals of care: Advanced Directive information Advanced Directives 10/05/2018  Does Patient Have a Medical Advance Directive? Yes  Type of Paramedic of [place];Living will  Does patient want to make changes to medical advance directive? No - Patient declined  Copy of [place] in Chart? Yes - validated most recent copy scanned in chart (See row information)     Chief Complaint  Patient presents with  . Acute Visit    burning inside possible UTI    HPI:  Pt is a 78 y.o. female seen today for an acute visit for Loose stools x4 yesterday, none today, c/o feeling burning in gut and nauseated, but no vomiting or abd pain. noted discomfort and pressure palpated around umbilical and suprapubic area. She stated urinary hesitance, but denied   dysuria, urinary urgency, or incontinent of urine. She eat breakfast and lunch well. She denied headache, dizziness, chest pain/pressure, or palpitation.    The patient desires to discuss goals of care.     Past Medical History:  Diagnosis Date  . Cataract 2010   Dr. Alexander Mt, DrJerlyn Ly @ [place]  . Seizures ([place])    Past Surgical History:  Procedure Laterality Date  . ABDOMINAL HYSTERECTOMY  1970  . APPENDECTOMY  1958  . FEMUR SURGERY  2018   Broken    Allergies  Allergen  Reactions  . Barbiturates   . Cefdinir   . Phenobarbital   . Tegretol [Carbamazepine]     Allergies as of 10/05/2018      Reactions   Barbiturates    Cefdinir    Phenobarbital    Tegretol [carbamazepine]       Medication List       Accurate as of October 05, 2018 11:59 PM. Always use your most recent med list.        clotrimazole-betamethasone cream Commonly known as:  LOTRISONE Apply 1 application topically 2 (two) times daily as needed. Apply thin layer to rash under breast and arms as needed.   hydrocortisone 1 % lotion Apply 1 application topically 2 (two) times daily as needed for itching. Apply for 10 days as needed   multivitamin with minerals tablet Take 1 tablet by mouth daily.   ICAPS PO Take 1 tablet by mouth daily.   SYSTANE 0.4-0.3 % Soln Generic drug:  Polyethyl Glycol-Propyl Glycol Apply 1 drop to eye 4 (four) times daily as needed (dry eye).   Vitamin D (Cholecalciferol) 25 MCG (1000 UT) Tabs Take 25 mcg by mouth daily.   zonisamide 100 MG capsule Commonly known as:  ZONEGRAN Take 100 mg by mouth at bedtime.       Review of Systems  Constitutional: Positive for activity change and fatigue. Negative for appetite change, chills, diaphoresis and fever.  HENT: Positive for hearing loss. Negative for congestion and voice change.  Respiratory: Negative for cough, shortness of breath and wheezing.   Cardiovascular: Positive for leg swelling. Negative for chest pain and palpitations.  Gastrointestinal: Positive for abdominal pain, diarrhea and nausea. Negative for abdominal distention, anal bleeding, blood in stool, constipation and vomiting.       Feels burning in gut.   Genitourinary: Positive for difficulty urinating. Negative for dysuria, frequency, hematuria and urgency.       Urinary hesitation  Musculoskeletal: Positive for gait problem.  Neurological: Negative for dizziness, seizures, speech difficulty and headaches.  Psychiatric/Behavioral:  Negative for agitation, behavioral problems, hallucinations and sleep disturbance. The patient is not nervous/anxious.      There is no immunization history on file for this patient. Pertinent  Health Maintenance Due  Topic Date Due  . DEXA SCAN  07/04/2004  . PNA vac Low Risk Adult (1 of 2 - PCV13) 07/04/2004  . INFLUENZA VACCINE  04/12/2018   No flowsheet data found. Functional Status Survey:    Vitals:   10/05/18 1252  BP: 140/70  Pulse: 82  Resp: (!) 22  Temp: 98 F (36.7 C)  SpO2: 96%  Weight: 168 lb 6.4 oz (76.4 kg)  Height: 5\' 2"  (1.575 m)   Body mass index is 30.8 kg/m. Physical Exam Constitutional:      General: She is not in acute distress.    Appearance: Normal appearance. She is not ill-appearing, toxic-appearing or diaphoretic.  HENT:     Head: Normocephalic and atraumatic.     Nose: Nose normal. No congestion or rhinorrhea.     Mouth/Throat:     Mouth: Mucous membranes are moist.  Eyes:     General: No scleral icterus.    Extraocular Movements: Extraocular movements intact.     Conjunctiva/sclera: Conjunctivae normal.     Pupils: Pupils are equal, round, and reactive to light.  Neck:     Musculoskeletal: Normal range of motion and neck supple.  Cardiovascular:     Rate and Rhythm: Normal rate and regular rhythm.     Heart sounds: No murmur.  Pulmonary:     Effort: Pulmonary effort is normal.     Breath sounds: Normal breath sounds. No wheezing, rhonchi or rales.  Abdominal:     General: Bowel sounds are normal. There is no distension.     Palpations: Abdomen is soft.     Tenderness: There is abdominal tenderness. There is no guarding or rebound.     Comments: Felt discomfort/pressure from umbilicus to suprapubic area on palpation.   Musculoskeletal:     Right lower leg: Edema present.     Left lower leg: Edema present.     Comments: Trace edema BLE  Skin:    General: Skin is warm and dry.     Coloration: Skin is not pale.     Findings: No  erythema.  Neurological:     General: No focal deficit present.     Mental Status: She is alert and oriented to person, place, and time. Mental status is at baseline.     Cranial Nerves: No cranial nerve deficit.     Motor: No weakness.     Coordination: Coordination normal.     Gait: Gait abnormal.  Psychiatric:        Mood and Affect: Mood normal.        Behavior: Behavior normal.        Thought Content: Thought content normal.        Judgment: Judgment normal.     Labs  reviewed: Recent Labs    10/05/18  NA 141  K 4.1  CL 103  CO2 30  BUN 20  CREATININE 0.9  CALCIUM 9.7   Recent Labs    10/05/18  AST 18  ALT 19  ALKPHOS 96  PROT 6.8  ALBUMIN 4.0   Recent Labs    10/05/18  HGB 13.4  HCT 40  PLT 275   No results found for: TSH No results found for: HGBA1C No results found for: CHOL, HDL, LDLCALC, LDLDIRECT, TRIG, CHOLHDL  Significant Diagnostic Results in last 30 days:  No results found.  Assessment/Plan: Diarrhea Loose stools x4 yesterday, none today, c/o feeling burning in gut and  nauseated, but no vomiting or abd pain, discomfort and pressure palpated around umbilical and suprapubic area. Hx of diverticulitis, obtain KUB, CBC/diff, CMP. Will try Omeprazole 20mg  qd for symptom and clinical presumed GERD.   GERD (gastroesophageal reflux disease) C/o burning in gut, will try Omeprazole 20mg  qd. Observe.   Urinary hesitancy Denied dysuria, urinary urgency, or incontinent of urine. She did admit suprapubic pressure/discomfort when palpated. Obtain UA C/S.   Advanced care planning/counseling discussion Goals of care discussion: HPOA document on file, reviewed goals of care with the patient. Social worker present. The patient would like to be DNR when the patient has no pulse and is not breathing. Went over and filled out MOST for between 2:10pm-2:35pm. The patient would like to be transferred to hospital if indicated with full scope of treatment. She agreed to  antibiotics as indicated when infection occurs. She desires IVF if indicated. Feeding tube for trail period if oral fluid and nutrition unfeasible physically. Forms were singed by the patient, Education officer, museum, and myself. Copies made for chart and family.   UTI (urinary tract infection) 10/08/18 urine culture E Coli >100,000c/ml, Nitrofurantoin 100mg  q12h x 7 days, FloraStor bid x7 days.   Adynamic ileus ([place]) Per KUB 10/05/18, Full liquid diet x 24 hours, monitor for s/s SBO-N/V/D or constipation. Observe.     Family/ staff Communication: plan of care reviewed with the patient, Education officer, museum, and Camera operator.   Labs/tests ordered:  CBC/diff, CMP, UA C/S, KUB  Time spend 25 minutes

## 2018-10-05 NOTE — Assessment & Plan Note (Signed)
Loose stools x4 yesterday, none today, c/o feeling burning in gut and  nauseated, but no vomiting or abd pain, discomfort and pressure palpated around umbilical and suprapubic area. Hx of diverticulitis, obtain KUB, CBC/diff, CMP. Will try Omeprazole 20mg  qd for symptom and clinical presumed GERD.

## 2018-10-05 NOTE — Assessment & Plan Note (Addendum)
Goals of care discussion: HPOA document on file, reviewed goals of care with the patient. Social worker present. The patient would like to be DNR when the patient has no pulse and is not breathing. Went over and filled out MOST for between 2:10pm-2:35pm. The patient would like to be transferred to hospital if indicated with full scope of treatment. She agreed to antibiotics as indicated when infection occurs. She desires IVF if indicated. Feeding tube for trail period if oral fluid and nutrition unfeasible physically. Forms were singed by the patient, Education officer, museum, and myself. Copies made for chart and family.

## 2018-10-05 NOTE — Assessment & Plan Note (Signed)
Denied dysuria, urinary urgency, or incontinent of urine. She did admit suprapubic pressure/discomfort when palpated. Obtain UA C/S.

## 2018-10-05 NOTE — Assessment & Plan Note (Signed)
C/o burning in gut, will try Omeprazole 20mg  qd. Observe.

## 2018-10-08 ENCOUNTER — Other Ambulatory Visit: Payer: Self-pay | Admitting: *Deleted

## 2018-10-08 DIAGNOSIS — N390 Urinary tract infection, site not specified: Secondary | ICD-10-CM | POA: Insufficient documentation

## 2018-10-08 DIAGNOSIS — K560 Paralytic ileus: Secondary | ICD-10-CM | POA: Insufficient documentation

## 2018-10-08 LAB — COMPLETE METABOLIC PANEL WITH GFR
Albumin: 4
Calcium: 9.7
Carbon Dioxide, Total: 30
Chloride: 103
EGFR (Non-African Amer.): 60
Globulin: 2.8
Total Protein: 6.8 g/dL

## 2018-10-08 NOTE — Assessment & Plan Note (Signed)
Per KUB 10/05/18, Full liquid diet x 24 hours, monitor for s/s SBO-N/V/D or constipation. Observe.

## 2018-10-08 NOTE — Assessment & Plan Note (Signed)
10/08/18 urine culture E Coli >100,000c/ml, Nitrofurantoin 100mg  q12h x 7 days, FloraStor bid x7 days.

## 2018-10-10 DIAGNOSIS — Z7389 Other problems related to life management difficulty: Secondary | ICD-10-CM | POA: Diagnosis not present

## 2018-10-10 DIAGNOSIS — N3941 Urge incontinence: Secondary | ICD-10-CM | POA: Diagnosis not present

## 2018-10-10 DIAGNOSIS — R41841 Cognitive communication deficit: Secondary | ICD-10-CM | POA: Diagnosis not present

## 2018-10-10 DIAGNOSIS — M6281 Muscle weakness (generalized): Secondary | ICD-10-CM | POA: Diagnosis not present

## 2018-10-10 DIAGNOSIS — Z9181 History of falling: Secondary | ICD-10-CM | POA: Diagnosis not present

## 2018-10-10 DIAGNOSIS — R2681 Unsteadiness on feet: Secondary | ICD-10-CM | POA: Diagnosis not present

## 2018-10-12 DIAGNOSIS — N3941 Urge incontinence: Secondary | ICD-10-CM | POA: Diagnosis not present

## 2018-10-12 DIAGNOSIS — R2681 Unsteadiness on feet: Secondary | ICD-10-CM | POA: Diagnosis not present

## 2018-10-12 DIAGNOSIS — Z7389 Other problems related to life management difficulty: Secondary | ICD-10-CM | POA: Diagnosis not present

## 2018-10-12 DIAGNOSIS — Z9181 History of falling: Secondary | ICD-10-CM | POA: Diagnosis not present

## 2018-10-12 DIAGNOSIS — M6281 Muscle weakness (generalized): Secondary | ICD-10-CM | POA: Diagnosis not present

## 2018-10-12 DIAGNOSIS — R41841 Cognitive communication deficit: Secondary | ICD-10-CM | POA: Diagnosis not present

## 2018-10-16 DIAGNOSIS — M6281 Muscle weakness (generalized): Secondary | ICD-10-CM | POA: Diagnosis not present

## 2018-10-16 DIAGNOSIS — R2681 Unsteadiness on feet: Secondary | ICD-10-CM | POA: Diagnosis not present

## 2018-10-16 DIAGNOSIS — Z7389 Other problems related to life management difficulty: Secondary | ICD-10-CM | POA: Diagnosis not present

## 2018-10-16 DIAGNOSIS — N3946 Mixed incontinence: Secondary | ICD-10-CM | POA: Diagnosis not present

## 2018-10-16 DIAGNOSIS — Z9181 History of falling: Secondary | ICD-10-CM | POA: Diagnosis not present

## 2018-10-16 DIAGNOSIS — N3941 Urge incontinence: Secondary | ICD-10-CM | POA: Diagnosis not present

## 2018-10-18 DIAGNOSIS — N3941 Urge incontinence: Secondary | ICD-10-CM | POA: Diagnosis not present

## 2018-10-18 DIAGNOSIS — Z7389 Other problems related to life management difficulty: Secondary | ICD-10-CM | POA: Diagnosis not present

## 2018-10-18 DIAGNOSIS — M6281 Muscle weakness (generalized): Secondary | ICD-10-CM | POA: Diagnosis not present

## 2018-10-18 DIAGNOSIS — N3946 Mixed incontinence: Secondary | ICD-10-CM | POA: Diagnosis not present

## 2018-10-18 DIAGNOSIS — Z9181 History of falling: Secondary | ICD-10-CM | POA: Diagnosis not present

## 2018-10-18 DIAGNOSIS — R2681 Unsteadiness on feet: Secondary | ICD-10-CM | POA: Diagnosis not present

## 2018-10-19 LAB — LIPID PANEL
Cholesterol: 215 — AB (ref 0–200)
HDL: 48 (ref 35–70)
LDL Cholesterol: 141
LDl/HDL Ratio: 4.5
Triglycerides: 134 (ref 40–160)

## 2018-10-19 LAB — BASIC METABOLIC PANEL
BUN: 13 (ref 4–21)
Creatinine: 0.9 (ref <=1.1)
Glucose: 92
Potassium: 4.3 (ref 3.4–5.3)
Sodium: 141 (ref 137–147)

## 2018-10-19 LAB — CBC AND DIFFERENTIAL
HCT: 40 (ref 36–46)
Hemoglobin: 13 (ref 12.0–16.0)
Platelets: 261 (ref 150–399)
WBC: 8.6

## 2018-10-19 LAB — HEPATIC FUNCTION PANEL
ALT: 15 (ref 7–35)
AST: 13 (ref 13–35)
Alkaline Phosphatase: 101 (ref 25–125)
Bilirubin, Total: 0.4

## 2018-10-19 LAB — TSH: TSH: 4.52 (ref <=5.90)

## 2018-10-31 ENCOUNTER — Other Ambulatory Visit: Payer: Self-pay | Admitting: *Deleted

## 2018-10-31 LAB — COMPLETE METABOLIC PANEL WITH GFR
Albumin: 3.7
Calcium: 9
Carbon Dioxide, Total: 30
Chloride: 106
GFR CALC NON AF AMER: 64
Globulin: 2.8
TOTAL PROTEIN: 6.5 g/dL

## 2018-11-05 ENCOUNTER — Encounter: Payer: Self-pay | Admitting: *Deleted

## 2018-11-09 LAB — VITAMIN D, 25-OH,TOTAL,IA(REFL): Vit D, 25-Hydroxy: 34.3

## 2018-11-16 ENCOUNTER — Non-Acute Institutional Stay (SKILLED_NURSING_FACILITY): Payer: Medicare Other | Admitting: Nurse Practitioner

## 2018-11-16 ENCOUNTER — Encounter: Payer: Self-pay | Admitting: Nurse Practitioner

## 2018-11-16 DIAGNOSIS — E785 Hyperlipidemia, unspecified: Secondary | ICD-10-CM

## 2018-11-16 DIAGNOSIS — R030 Elevated blood-pressure reading, without diagnosis of hypertension: Secondary | ICD-10-CM | POA: Diagnosis not present

## 2018-11-16 DIAGNOSIS — K219 Gastro-esophageal reflux disease without esophagitis: Secondary | ICD-10-CM | POA: Diagnosis not present

## 2018-11-16 DIAGNOSIS — G40909 Epilepsy, unspecified, not intractable, without status epilepticus: Secondary | ICD-10-CM

## 2018-11-16 NOTE — Progress Notes (Signed)
Location:  [place] Room Number: [place] of Service:  ALF [phone])  Provider: Marlana Latus  NP  PCP: Luccas Towell X, NP Patient Care Team: Lacresha Fusilier X, NP as PCP - General (Internal Medicine) Rosita Fire, MD as Referring Physician (Neurology) Dorothe Pea, Jeri Lager, MD as Referring Physician (Dermatology)  Extended Emergency Contact Information Primary Emergency Contact: Stampley,James Address: [place] [address] [place] Phone: [phone] Relation: Son Secondary Emergency Contact: Morici,Caroline Address: [place] [address] [place] Phone: [phone] Work Phone: [phone] Mobile Phone: [phone] Relation: Daughter  Code Status: DNR Goals of care:  Advanced Directive information Advanced Directives 11/16/2018  Does Patient Have a Medical Advance Directive? Yes  Type of Paramedic of [place];Living will;Out of facility DNR (pink MOST or yellow form)  Does patient want to make changes to medical advance directive? No - Patient declined  Copy of [place] in Chart? Yes - validated most recent copy scanned in chart (See row information)  Pre-existing out of facility DNR order (yellow form or pink MOST form) Yellow form placed in chart (order not valid for inpatient use);[place]ink MOST form placed in chart (order not valid for inpatient use)     Allergies  Allergen Reactions  . Barbiturates   . Cefdinir   . Phenobarbital   . Tegretol [Carbamazepine]     Chief Complaint  Patient presents with  . Discharge Note    HPI:  78 y.o. female was admitted to [facility] service 09/17/18. She has been in [facility] about 3-4 months for continuation of therapy. She had pin/repair of the left femur fractures about a year ago. She has regained her physical strength and function, she is capable to live independently/safet discharge  to [facility].   Hx of seizure, no active seizures since admitted to [facility], on Zonisamide 100mg  qd. GERD, stable, occasional heart burns, on Omeprazole 20mg  qd. Hyperlipidemia, on Atorvastatin 20mg  qd, last LDL 141 10/19/18  Her sbp 164 today, 10/05/18 was 140, 16/20 was 140. She denied headache, change of vision, chest pain/pressure, palpitation, nausea, vomiting. No hx of hypertension.   Past Medical History:  Diagnosis Date  . Arthritis   . Cancer ([place])    skin  . Cataract 2010   Dr. Alexander Mt, DrJerlyn Ly @ [facility]  . Epilepsy ([place]) 08/11/2015  . Hyperlipidemia 12/08/2015  . Hypertension   . Polyp of colon 08/11/2015  . Seizures ([place])     Past Surgical History:  Procedure Laterality Date  . ABDOMINAL HYSTERECTOMY  1970  . APPENDECTOMY  1958  . BREAST SURGERY  1984  . COLON SURGERY  09/12/2006  . FEMUR SURGERY  2018   Broken  . TONSILLECTOMY  09/12/1944      reports that she has never smoked. She has never used smokeless tobacco. She reports that she does not drink alcohol or use drugs. Social History   Socioeconomic History  . Marital status: Widowed    Spouse name: Not on file  . Number of children: Not on file  . Years of education: Not on file  . Highest education level: Not on file  Occupational History  . 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 Financial resource strain: Not on file  . Food insecurity:    Worry:  Not on file    Inability: Not on file  . Transportation needs:    Medical: Not on file    Non-medical: Not on file  Tobacco Use  . Smoking status: Never Smoker  . Smokeless tobacco: Never Used  Substance and Sexual Activity  . Alcohol use: Never    Frequency: Never  . Drug use: Never  . Sexual activity: Not on file  Lifestyle  . Physical activity:    Days per week: Not on file    Minutes per session: Not on file  . Stress: Not on file  Relationships  . Social connections:    Talks on phone: Not on file    Gets together: Not on file      Attends religious service: Not on file    Active member of club or organization: Not on file    Attends meetings of clubs or organizations: Not on file    Relationship status: Not on file  . Intimate partner violence:    Fear of current or ex partner: Not on file    Emotionally abused: Not on file    Physically abused: Not on file    Forced sexual activity: Not on file  Other Topics Concern  . Not on file  Social History Narrative   Social History     Socioeconomic History       Marital status: Widowed       Number of children:2       Years of education:  AB in Art [phone])       Highest education level: Bachelors in      Occupational History : Engineer, 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       Food insecurity:         Worry: Not on file         Inability: Not on file       Transportation needs:         Medical: Not on file         Non-medical: Not on file     Tobacco Use       Smoking status: Not on file     Substance and Sexual Activity       Alcohol use: Not on file       Drug use: Not on file       Sexual activity: Not on file     Lifestyle       Physical activity:         Days per week: Not on file         Minutes per session: A little       Stress: Not on file     Relationships       Social connections:         Talks on phone: Not on file         Gets together: Not on file         Attends religious service: Not on file         Active member of club or organization: Not on file         Attends meetings of clubs or organizations: Not on file         Relationship status: Not on file       Intimate partner violence:  Fear of current or ex partner: Not on file         Emotionally abused: Not on file         Physically abused: Not on file         Forced sexual activity: Not on file     Other Topics       Concerns:         Not on file     Social History Narrative       Not on file      Functional  Status Survey:    Allergies  Allergen Reactions  . Barbiturates   . Cefdinir   . Phenobarbital   . Tegretol [Carbamazepine]     Pertinent  Health Maintenance Due  Topic Date Due  . INFLUENZA VACCINE  04/12/2018  . DEXA SCAN  Completed  . PNA vac Low Risk Adult  Completed    Medications: Outpatient Encounter Medications as of 11/16/2018  Medication Sig  . atorvastatin (LIPITOR) 20 MG tablet Take 20 mg by mouth daily.  . ciclopirox (LOPROX) 0.77 % cream Apply topically 2 (two) times daily as needed. Apply bid to inflamed skin for 10 days only as needed.  . clotrimazole-betamethasone (LOTRISONE) cream Apply 1 application topically as needed. Apply thin layer to rash under breast and arms as needed.   Marland Kitchen DEXTROMETHORPHAN-GUAIFENESIN PO Take by mouth. Give 2 tsp every four hours for cough as needed.  . hydrocortisone 1 % lotion Apply 1 application topically 2 (two) times daily as needed for itching. Apply for 10 days as needed  . Multiple Vitamins-Minerals (ICAPS PO) Take 1 tablet by mouth daily.  . Multiple Vitamins-Minerals (MULTIVITAMIN WITH MINERALS) tablet Take 1 tablet by mouth daily.  Marland Kitchen omeprazole (PRILOSEC) 20 MG capsule Take 20 mg by mouth daily.  Vladimir Faster Glycol-Propyl Glycol (SYSTANE) 0.4-0.3 % SOLN Apply 1 drop to eye 4 (four) times daily as needed (dry eye).  . Sodium Fluoride (PREVIDENT 5000 BOOSTER PLUS) 1.1 % PSTE Place onto teeth. Brush teeth twice daily  . Specialty Vitamins Products (ICAPS LUTEIN-ZEAXANTHIN PO) Take 1 tablet by mouth daily.  . Vitamin D, Cholecalciferol, 25 MCG (1000 UT) TABS Take 25 mcg by mouth daily.  Marland Kitchen zonisamide (ZONEGRAN) 100 MG capsule Take 100 mg by mouth at bedtime.   No facility-administered encounter medications on file as of 11/16/2018.     Review of Systems  Constitutional: Negative for activity change, appetite change, chills, diaphoresis, fatigue and unexpected weight change.  HENT: Positive for hearing loss. Negative for congestion  and voice change.   Respiratory: Negative for cough, shortness of breath and wheezing.   Cardiovascular: Positive for leg swelling. Negative for chest pain and palpitations.  Gastrointestinal: Negative for abdominal distention, abdominal pain, constipation, diarrhea, nausea and vomiting.  Genitourinary: Negative for difficulty urinating, dysuria and urgency.  Musculoskeletal: Positive for gait problem.  Skin: Negative for color change and pallor.  Neurological: Negative for dizziness, facial asymmetry, speech difficulty, weakness and headaches.       Memory lapses.   Psychiatric/Behavioral: Negative for agitation, behavioral problems, hallucinations and sleep disturbance. The patient is not nervous/anxious.     Vitals:   11/16/18 0855  BP: (!) 164/78  Pulse: 81  Resp: 17  Temp: 98 F (36.7 C)  SpO2: 95%  Weight: 168 lb 9.6 oz (76.5 kg)  Height: 5\' 2"  (1.575 m)   Body mass index is 30.84 kg/m. Physical Exam Constitutional:      General: She is not in  acute distress.    Appearance: Normal appearance. She is obese. She is not ill-appearing, toxic-appearing or diaphoretic.  HENT:     Head: Normocephalic and atraumatic.     Nose: Nose normal.     Mouth/Throat:     Mouth: Mucous membranes are moist.  Eyes:     Extraocular Movements: Extraocular movements intact.     Pupils: Pupils are equal, round, and reactive to light.  Neck:     Musculoskeletal: Normal range of motion and neck supple.  Cardiovascular:     Rate and Rhythm: Normal rate and regular rhythm.     Heart sounds: No murmur.  Pulmonary:     Effort: Pulmonary effort is normal.     Breath sounds: Normal breath sounds. No wheezing, rhonchi or rales.  Abdominal:     General: There is no distension.     Palpations: Abdomen is soft.     Tenderness: There is no abdominal tenderness. There is no guarding or rebound.  Musculoskeletal: Normal range of motion.     Right lower leg: Edema present.     Left lower leg: Edema  present.     Comments: 1+edema BLE  Skin:    General: Skin is warm and dry.  Neurological:     General: No focal deficit present.     Mental Status: She is alert. Mental status is at baseline.     Cranial Nerves: No cranial nerve deficit.     Motor: No weakness.     Coordination: Coordination normal.     Gait: Gait abnormal.     Comments: Oriented to person and place.   Psychiatric:        Mood and Affect: Mood normal.        Behavior: Behavior normal.        Thought Content: Thought content normal.        Judgment: Judgment normal.     Labs reviewed: Basic Metabolic Panel: Recent Labs    04/19/18 10/05/18 10/19/18  NA 141 141 141  K 4.8 4.1 4.3  CL  --  103 106  CO2  --  30 30  BUN 18 20 13   CREATININE 0.9 0.9 0.9  CALCIUM  --  9.7 9.0   Liver Function Tests: Recent Labs    04/19/18 10/05/18 10/19/18  AST 17 18 13   ALT 17 19 15   ALKPHOS 129* 96 101  PROT  --  6.8 6.5  ALBUMIN  --  4.0 3.7   No results for input(s): LIPASE, AMYLASE in the last 8760 hours. No results for input(s): AMMONIA in the last 8760 hours. CBC: Recent Labs    04/19/18 10/05/18 10/19/18  WBC 9.3  --  8.6  HGB 13.2 13.4 13.0  HCT 40 40 40  PLT 286 275 261   Cardiac Enzymes: No results for input(s): CKTOTAL, CKMB, CKMBINDEX, TROPONINI in the last 8760 hours. BNP: Invalid input(s): POCBNP CBG: No results for input(s): GLUCAP in the last 8760 hours.  Procedures and Imaging Studies During Stay: No results found.  Assessment/Plan:   GERD (gastroesophageal reflux disease) Stable, continue Omeprazole 20mg  qd.   Seizure disorder (HCC) Stable, continue Zonisamide 100mg  qd.   Hyperlipidemia LDL 141 09/18/18, Atorvastatin 20mg  qd started 09/19/18, repeat lipid panel in 3 month ordered 09/19/18.   Elevated systolic blood pressure reading without diagnosis of hypertension Will f/u in clinic [facility]. The patient was instructed to call if HTN symptoms develop,  to be seen sooner if Bp is elevated  persistently.  Monitor Bp at home     Patient is being discharged with the following home health services:    Patient is being discharged with the following durable medical equipment:    Patient has been advised to f/u with their PCP in 1-2 weeks to for a transitions of care visit.  Social services at their facility was responsible for arranging this appointment.  Pt was provided with adequate prescriptions of noncontrolled medications to reach the scheduled appointment .  For controlled substances, a limited supply was provided as appropriate for the individual patient.  If the pt normally receives these medications from a pain clinic or has a contract with another physician, these medications should be received from that clinic or physician only).    Future labs/tests needed:  F/u lipid panel ordered 09/19/18

## 2018-11-16 NOTE — Assessment & Plan Note (Signed)
Stable, continue Zonisamide 100mg qd.  

## 2018-11-16 NOTE — Assessment & Plan Note (Signed)
Will f/u in clinic [place]. The patient was instructed to call if HTN symptoms develop,  to be seen sooner if Bp is elevated persistently. Monitor Bp at home

## 2018-11-16 NOTE — Assessment & Plan Note (Signed)
LDL 141 09/18/18, Atorvastatin 20mg  qd started 09/19/18, repeat lipid panel in 3 month ordered 09/19/18.

## 2018-11-16 NOTE — Assessment & Plan Note (Addendum)
Stable, continue Omeprazole 20mg qd.  

## 2018-12-11 DIAGNOSIS — I10 Essential (primary) hypertension: Secondary | ICD-10-CM | POA: Diagnosis not present

## 2018-12-11 DIAGNOSIS — M6281 Muscle weakness (generalized): Secondary | ICD-10-CM | POA: Diagnosis not present

## 2018-12-11 DIAGNOSIS — E669 Obesity, unspecified: Secondary | ICD-10-CM | POA: Diagnosis not present

## 2018-12-11 DIAGNOSIS — Z7389 Other problems related to life management difficulty: Secondary | ICD-10-CM | POA: Diagnosis not present

## 2018-12-11 DIAGNOSIS — Z87440 Personal history of urinary (tract) infections: Secondary | ICD-10-CM | POA: Diagnosis not present

## 2018-12-11 DIAGNOSIS — E785 Hyperlipidemia, unspecified: Secondary | ICD-10-CM | POA: Diagnosis not present

## 2018-12-12 DIAGNOSIS — Z7389 Other problems related to life management difficulty: Secondary | ICD-10-CM | POA: Diagnosis not present

## 2018-12-12 DIAGNOSIS — Z87440 Personal history of urinary (tract) infections: Secondary | ICD-10-CM | POA: Diagnosis not present

## 2018-12-12 DIAGNOSIS — E669 Obesity, unspecified: Secondary | ICD-10-CM | POA: Diagnosis not present

## 2018-12-12 DIAGNOSIS — M6281 Muscle weakness (generalized): Secondary | ICD-10-CM | POA: Diagnosis not present

## 2018-12-12 DIAGNOSIS — I10 Essential (primary) hypertension: Secondary | ICD-10-CM | POA: Diagnosis not present

## 2018-12-12 DIAGNOSIS — E785 Hyperlipidemia, unspecified: Secondary | ICD-10-CM | POA: Diagnosis not present

## 2018-12-13 ENCOUNTER — Other Ambulatory Visit: Payer: Self-pay

## 2018-12-13 ENCOUNTER — Encounter: Payer: Medicare Other | Admitting: Nurse Practitioner

## 2018-12-13 NOTE — Progress Notes (Signed)
Location:   clinic [place]   Place of Service: clinic [place]   Provider: Marlana Latus NP  Code Status: DNR Goals of Care: [place] Advanced Directives 11/16/2018  Does Patient Have a Medical Advance Directive? Yes  Type of Paramedic of [place];Living will;Out of facility DNR (pink MOST or yellow form)  Does patient want to make changes to medical advance directive? No - Patient declined  Copy of [place] in Chart? Yes - validated most recent copy scanned in chart (See row information)  Pre-existing out of facility DNR order (yellow form or pink MOST form) Yellow form placed in chart (order not valid for inpatient use);Pink MOST form placed in chart (order not valid for inpatient use)     No chief complaint on file.   HPI: Patient is a 78 y.o. female seen today for    The patient has hx of GERD, stable on Omeprazole 20mg  qd. No active seizures since last seen, on Zonisamide 100mg  qd. Hyperlipidemia, stable on Atorvastatin 20mg  qd.   Past Medical History:  Diagnosis Date  . Arthritis   . Cancer ([place])    skin  . Cataract 2010   Dr. Alexander Mt, DrJerlyn Ly @ [facility]  . Epilepsy ([place]) 08/11/2015  . Hyperlipidemia 12/08/2015  . Hypertension   . Polyp of colon 08/11/2015  . Seizures ([place])     Past Surgical History:  Procedure Laterality Date  . ABDOMINAL HYSTERECTOMY  1970  . APPENDECTOMY  1958  . BREAST SURGERY  1984  . COLON SURGERY  09/12/2006  . FEMUR SURGERY  2018   Broken  . TONSILLECTOMY  09/12/1944    Allergies  Allergen Reactions  . Barbiturates   . Cefdinir   . Phenobarbital   . Tegretol [Carbamazepine]     Allergies as of 12/13/2018      Reactions   Barbiturates    Cefdinir    Phenobarbital    Tegretol [carbamazepine]       Medication List       Accurate as of December 13, 2018  1:58 PM. Always use your most recent med list.        atorvastatin 20 MG tablet Commonly known as:  LIPITOR Take 20 mg by mouth daily.    ciclopirox 0.77 % cream Commonly known as:  LOPROX Apply topically 2 (two) times daily as needed. Apply bid to inflamed skin for 10 days only as needed.   clotrimazole-betamethasone cream Commonly known as:  LOTRISONE Apply 1 application topically as needed. Apply thin layer to rash under breast and arms as needed.   DEXTROMETHORPHAN-GUAIFENESIN PO Take by mouth. Give 2 tsp every four hours for cough as needed.   hydrocortisone 1 % lotion Apply 1 application topically 2 (two) times daily as needed for itching. Apply for 10 days as needed   ICAPS LUTEIN-ZEAXANTHIN PO Take 1 tablet by mouth daily.   multivitamin with minerals tablet Take 1 tablet by mouth daily.   ICAPS PO Take 1 tablet by mouth daily.   omeprazole 20 MG capsule Commonly known as:  PRILOSEC Take 20 mg by mouth daily.   PreviDent 5000 Booster Plus 1.1 % Pste Generic drug:  Sodium Fluoride Place onto teeth. Brush teeth twice daily   Systane 0.4-0.3 % Soln Generic drug:  Polyethyl Glycol-Propyl Glycol Apply 1 drop to eye 4 (four) times daily as needed (dry eye).   Vitamin D (Cholecalciferol) 25 MCG (1000 UT) Tabs Take 25 mcg by mouth daily.   zonisamide  100 MG capsule Commonly known as:  ZONEGRAN Take 100 mg by mouth at bedtime.       Review of Systems:  Review of Systems  Constitutional: Negative for activity change, appetite change, chills, diaphoresis, fatigue and fever.  HENT: Positive for hearing loss. Negative for congestion and voice change.   Respiratory: Negative for cough, shortness of breath and wheezing.   Cardiovascular: Positive for leg swelling. Negative for chest pain and palpitations.  Gastrointestinal: Negative for abdominal distention, abdominal pain, constipation, diarrhea, nausea and vomiting.  Genitourinary: Negative for difficulty urinating, dysuria and urgency.  Musculoskeletal: Negative for gait problem.  Skin: Negative for color change and pallor.  Neurological: Negative  for dizziness, speech difficulty, weakness and headaches.  Psychiatric/Behavioral: Negative for agitation, behavioral problems, hallucinations and sleep disturbance. The patient is not nervous/anxious.     Health Maintenance  Topic Date Due  . INFLUENZA VACCINE  04/13/2019  . TETANUS/TDAP  12/21/2025  . DEXA SCAN  Completed  . PNA vac Low Risk Adult  Completed    Physical Exam: There were no vitals filed for this visit. There is no height or weight on file to calculate BMI. Physical Exam Constitutional:      Appearance: Normal appearance.  HENT:     Head: Normocephalic and atraumatic.     Nose: Nose normal.     Mouth/Throat:     Mouth: Mucous membranes are moist.  Eyes:     Extraocular Movements: Extraocular movements intact.     Pupils: Pupils are equal, round, and reactive to light.  Neck:     Musculoskeletal: Normal range of motion and neck supple.  Cardiovascular:     Rate and Rhythm: Normal rate and regular rhythm.     Heart sounds: No murmur.  Pulmonary:     Effort: Pulmonary effort is normal.     Breath sounds: No wheezing, rhonchi or rales.  Abdominal:     General: Bowel sounds are normal.     Palpations: Abdomen is soft.     Tenderness: There is no abdominal tenderness. There is no guarding or rebound.  Musculoskeletal:     Right lower leg: Edema present.     Left lower leg: Edema present.  Skin:    General: Skin is warm and dry.  Neurological:     General: No focal deficit present.     Mental Status: She is alert and oriented to person, place, and time. Mental status is at baseline.     Motor: No weakness.     Coordination: Coordination normal.     Gait: Gait normal.  Psychiatric:        Mood and Affect: Mood normal.        Behavior: Behavior normal.        Thought Content: Thought content normal.        Judgment: Judgment normal.     Labs reviewed: Basic Metabolic Panel: Recent Labs    04/19/18 10/05/18 10/19/18  NA 141 141 141  K 4.8 4.1 4.3   CL  --  103 106  CO2  --  30 30  BUN 18 20 13   CREATININE 0.9 0.9 0.9  CALCIUM  --  9.7 9.0  TSH  --   --  4.52   Liver Function Tests: Recent Labs    04/19/18 10/05/18 10/19/18  AST 17 18 13   ALT 17 19 15   ALKPHOS 129* 96 101  PROT  --  6.8 6.5  ALBUMIN  --  4.0 3.7  No results for input(s): LIPASE, AMYLASE in the last 8760 hours. No results for input(s): AMMONIA in the last 8760 hours. CBC: Recent Labs    04/19/18 10/05/18 10/19/18  WBC 9.3  --  8.6  HGB 13.2 13.4 13.0  HCT 40 40 40  PLT 286 275 261   Lipid Panel: Recent Labs    10/19/18  CHOL 215*  HDL 48  LDLCALC 141  TRIG 134   Lab Results  Component Value Date   HGBA1C 6.2 04/16/2016    Procedures since last visit: No results found.  Assessment/Plan  GERD (gastroesophageal reflux disease) Stable, continue Omeprazole 20mg  qd.   Seizure disorder (HCC) Stable, continue Zonisamide 100mg  qd.   Hyperlipidemia Continue Atorvastatin 20mg  qd 09/18/18 cholesterol 215, triglycerides 134, HDL 46, LDL 141, Na 141, K 4.3, Bun 13, creat 0.86, TP 6.5, albumin 3.7, TSH 4.52, wbc 8.6, Hgb 13.0, plt 261 09/19/18 Atorvastatin 20mg  qd, lipid panel 3 months.     Labs/tests ordered:  None  Next appt:  4 months  This encounter was created in error - please disregard.

## 2018-12-13 NOTE — Assessment & Plan Note (Addendum)
Continue Atorvastatin 20mg  qd 09/18/18 cholesterol 215, triglycerides 134, HDL 46, LDL 141, Na 141, K 4.3, Bun 13, creat 0.86, TP 6.5, albumin 3.7, TSH 4.52, wbc 8.6, Hgb 13.0, plt 261 09/19/18 Atorvastatin 20mg  qd, lipid panel 3 months.

## 2018-12-13 NOTE — Assessment & Plan Note (Signed)
Stable, continue Zonisamide 100mg  qd.

## 2018-12-13 NOTE — Assessment & Plan Note (Signed)
Stable, continue Omeprazole 20mg qd.  

## 2018-12-17 DIAGNOSIS — I10 Essential (primary) hypertension: Secondary | ICD-10-CM | POA: Diagnosis not present

## 2018-12-17 DIAGNOSIS — E669 Obesity, unspecified: Secondary | ICD-10-CM | POA: Diagnosis not present

## 2018-12-17 DIAGNOSIS — M6281 Muscle weakness (generalized): Secondary | ICD-10-CM | POA: Diagnosis not present

## 2018-12-17 DIAGNOSIS — Z87440 Personal history of urinary (tract) infections: Secondary | ICD-10-CM | POA: Diagnosis not present

## 2018-12-17 DIAGNOSIS — E785 Hyperlipidemia, unspecified: Secondary | ICD-10-CM | POA: Diagnosis not present

## 2018-12-17 DIAGNOSIS — Z7389 Other problems related to life management difficulty: Secondary | ICD-10-CM | POA: Diagnosis not present

## 2018-12-19 DIAGNOSIS — I10 Essential (primary) hypertension: Secondary | ICD-10-CM | POA: Diagnosis not present

## 2018-12-19 DIAGNOSIS — M6281 Muscle weakness (generalized): Secondary | ICD-10-CM | POA: Diagnosis not present

## 2018-12-19 DIAGNOSIS — E669 Obesity, unspecified: Secondary | ICD-10-CM | POA: Diagnosis not present

## 2018-12-19 DIAGNOSIS — Z7389 Other problems related to life management difficulty: Secondary | ICD-10-CM | POA: Diagnosis not present

## 2018-12-19 DIAGNOSIS — Z87440 Personal history of urinary (tract) infections: Secondary | ICD-10-CM | POA: Diagnosis not present

## 2018-12-19 DIAGNOSIS — E785 Hyperlipidemia, unspecified: Secondary | ICD-10-CM | POA: Diagnosis not present

## 2018-12-20 ENCOUNTER — Other Ambulatory Visit: Payer: Self-pay

## 2018-12-20 ENCOUNTER — Non-Acute Institutional Stay: Payer: Medicare Other | Admitting: Nurse Practitioner

## 2018-12-20 ENCOUNTER — Encounter: Payer: Self-pay | Admitting: Nurse Practitioner

## 2018-12-20 VITALS — BP 140/70 | HR 95 | Temp 98.4°F | Resp 20 | Ht 62.0 in | Wt 208.0 lb

## 2018-12-20 DIAGNOSIS — L570 Actinic keratosis: Secondary | ICD-10-CM | POA: Insufficient documentation

## 2018-12-20 DIAGNOSIS — G40909 Epilepsy, unspecified, not intractable, without status epilepticus: Secondary | ICD-10-CM

## 2018-12-20 DIAGNOSIS — E785 Hyperlipidemia, unspecified: Secondary | ICD-10-CM

## 2018-12-20 DIAGNOSIS — K219 Gastro-esophageal reflux disease without esophagitis: Secondary | ICD-10-CM | POA: Diagnosis not present

## 2018-12-20 DIAGNOSIS — N390 Urinary tract infection, site not specified: Secondary | ICD-10-CM

## 2018-12-20 MED ORDER — ATORVASTATIN CALCIUM 20 MG PO TABS: 20.0000 mg | ORAL_TABLET | Freq: Every day | ORAL | 2 refills | Status: DC

## 2018-12-20 NOTE — Assessment & Plan Note (Signed)
C/o dysuria, urinary hesitancy, obtain UA C/S

## 2018-12-20 NOTE — Patient Instructions (Signed)
F/u in clinic 4 months.

## 2018-12-20 NOTE — Assessment & Plan Note (Signed)
Stable, continue Zonisamide 100mg  qd.

## 2018-12-20 NOTE — Assessment & Plan Note (Signed)
Continue Atorvastatin 20mg  qd started 09/19/18, update lipid panel.

## 2018-12-20 NOTE — Assessment & Plan Note (Signed)
Stable, continue Omeprazole 20mg qd.  

## 2018-12-20 NOTE — Assessment & Plan Note (Signed)
Dermatology referral 

## 2018-12-20 NOTE — Progress Notes (Signed)
Location:   clinic [facility]   Place of Service:  Clinic (12) Provider: Marlana Latus NP  Code Status: DNR Goals of Care: [place] 11/16/2018  Does Patient Have a Medical Advance Directive? Yes  Type of Paramedic of [place];Living will;Out of facility DNR (pink MOST or yellow form)  Does patient want to make changes to medical advance directive? No - Patient declined  Copy of [place] in Chart? Yes - validated most recent copy scanned in chart (See row information)  Pre-existing out of facility DNR order (yellow form or pink MOST form) Yellow form placed in chart (order not valid for inpatient use);Pink MOST form placed in chart (order not valid for inpatient use)     Chief Complaint  Patient presents with  . Medical Management of Chronic Issues    rash under breast , neck    HPI: Patient is a 78 y.o. female seen today for managing chronic medical conditions.    The patient has history of seizure, stable on Zonisamide 100mg  qd. GERD, stable on Omeprazole 20mg  qd. She takes lipitor 20mg  qd for hyperlipidemia. C/o a few days of dysuria, urinary hesitancy. She denied fever, incontinent of urine, lower abd or CVA tenderness, urinary urgency or increased frequency.   Past Medical History:  Diagnosis Date  . Arthritis   . Cancer ([place])    skin  . Cataract 2010   Dr. Alexander Mt, DrJerlyn Ly @ [facility]  . Epilepsy ([place]) 08/11/2015  . Hyperlipidemia 12/08/2015  . Hypertension   . Polyp of colon 08/11/2015  . Seizures ([place])     Past Surgical History:  Procedure Laterality Date  . ABDOMINAL HYSTERECTOMY  1970  . APPENDECTOMY  1958  . BREAST SURGERY  1984  . COLON SURGERY  09/12/2006  . FEMUR SURGERY  2018   Broken  . TONSILLECTOMY  09/12/1944    Allergies  Allergen Reactions  . Barbiturates   . Cefdinir   . Phenobarbital   . Tegretol [Carbamazepine]     Allergies as of 12/20/2018      Reactions   Barbiturates    Cefdinir    Phenobarbital    Tegretol [carbamazepine]       Medication List       Accurate as of December 20, 2018  4:19 PM. Always use your most recent med list.        atorvastatin 20 MG tablet Commonly known as:  LIPITOR Take 1 tablet (20 mg total) by mouth daily.   ICAPS LUTEIN-ZEAXANTHIN PO Take 1 tablet by mouth daily.   multivitamin with minerals tablet Take 1 tablet by mouth daily.   omeprazole 20 MG capsule Commonly known as:  PRILOSEC Take 20 mg by mouth daily.   PreviDent 5000 Booster Plus 1.1 % Pste Generic drug:  Sodium Fluoride Place onto teeth. Brush teeth  daily   Systane 0.4-0.3 % Soln Generic drug:  Polyethyl Glycol-Propyl Glycol Apply 1 drop to eye as needed (dry eye).   Vitamin D (Cholecalciferol) 25 MCG (1000 UT) Tabs Take 25 mcg by mouth daily.   zonisamide 100 MG capsule Commonly known as:  ZONEGRAN Take 100 mg by mouth at bedtime.       Review of Systems:  Review of Systems  Constitutional: Negative for activity change, appetite change, chills, diaphoresis, fatigue and fever.  HENT: Positive for hearing loss. Negative for congestion and voice change.   Respiratory: Negative for cough, shortness of breath and wheezing.   Cardiovascular: Positive  for leg swelling. Negative for chest pain and palpitations.  Gastrointestinal: Negative for abdominal distention, abdominal pain, constipation, diarrhea, nausea and vomiting.  Genitourinary: Positive for difficulty urinating and dysuria. Negative for flank pain, frequency, hematuria, pelvic pain and urgency.       Urinary hesitancy, dysuria.   Musculoskeletal: Positive for gait problem. Negative for arthralgias and back pain.  Skin: Negative for color change and pallor.  Neurological: Negative for dizziness, speech difficulty, weakness and headaches.       Memory lapses.   Psychiatric/Behavioral: Negative for agitation, behavioral problems, hallucinations and sleep disturbance. The patient is not nervous/anxious.      Health Maintenance  Topic Date Due  . INFLUENZA VACCINE  04/13/2019  . TETANUS/TDAP  12/21/2025  . DEXA SCAN  Completed  . PNA vac Low Risk Adult  Completed    Physical Exam: Vitals:   12/20/18 1434  BP: 140/70  Pulse: 95  Resp: 20  Temp: 98.4 F (36.9 C)  TempSrc: Oral  SpO2: 96%  Weight: 208 lb (94.3 kg)  Height: 5\' 2"  (1.575 m)   Body mass index is 38.04 kg/m. Physical Exam Vitals signs reviewed.  Constitutional:      General: She is not in acute distress.    Appearance: Normal appearance. She is obese. She is not ill-appearing, toxic-appearing or diaphoretic.  HENT:     Head: Atraumatic.     Nose: Nose normal.     Mouth/Throat:     Mouth: Mucous membranes are moist.  Eyes:     Extraocular Movements: Extraocular movements intact.     Conjunctiva/sclera: Conjunctivae normal.     Pupils: Pupils are equal, round, and reactive to light.  Neck:     Musculoskeletal: Normal range of motion and neck supple.  Cardiovascular:     Rate and Rhythm: Normal rate and regular rhythm.     Heart sounds: No murmur.  Pulmonary:     Effort: Pulmonary effort is normal.     Breath sounds: No wheezing, rhonchi or rales.  Abdominal:     General: There is no distension.     Palpations: Abdomen is soft.     Tenderness: There is no abdominal tenderness. There is no guarding or rebound.  Musculoskeletal:     Right lower leg: Edema present.     Left lower leg: Edema present.     Comments: 1+ edema BLE  Skin:    General: Skin is warm and dry.     Findings: Lesion present.     Comments: Face AK  Neurological:     General: No focal deficit present.     Mental Status: She is alert. Mental status is at baseline.     Cranial Nerves: No cranial nerve deficit.     Motor: No weakness.     Coordination: Coordination normal.     Gait: Gait abnormal.     Comments: Oriented to person and place.   Psychiatric:        Mood and Affect: Mood normal.        Behavior: Behavior normal.         Thought Content: Thought content normal.        Judgment: Judgment normal.     Labs reviewed: Basic Metabolic Panel: Recent Labs    04/19/18 10/05/18 10/19/18  NA 141 141 141  K 4.8 4.1 4.3  CL  --  103 106  CO2  --  30 30  BUN 18 20 13   CREATININE 0.9 0.9 0.9  CALCIUM  --  9.7 9.0  TSH  --   --  4.52   Liver Function Tests: Recent Labs    04/19/18 10/05/18 10/19/18  AST 17 18 13   ALT 17 19 15   ALKPHOS 129* 96 101  PROT  --  6.8 6.5  ALBUMIN  --  4.0 3.7   No results for input(s): LIPASE, AMYLASE in the last 8760 hours. No results for input(s): AMMONIA in the last 8760 hours. CBC: Recent Labs    04/19/18 10/05/18 10/19/18  WBC 9.3  --  8.6  HGB 13.2 13.4 13.0  HCT 40 40 40  PLT 286 275 261   Lipid Panel: Recent Labs    10/19/18  CHOL 215*  HDL 48  LDLCALC 141  TRIG 134   Lab Results  Component Value Date   HGBA1C 6.2 04/16/2016    Procedures since last visit: No results found.  Assessment/Plan Seizure disorder (HCC) Stable, continue Zonisamide 100mg  qd.   GERD (gastroesophageal reflux disease) Stable, continue Omeprazole 20mg  qd.   Hyperlipidemia Continue Atorvastatin 20mg  qd started 09/19/18, update lipid panel.   UTI (urinary tract infection) C/o dysuria, urinary hesitancy, obtain UA C/S  Actinic keratoses Dermatology referral.     Labs/tests ordered:  Lipid panel, UA C/S  Next appt:  4 months.

## 2018-12-21 DIAGNOSIS — Z7389 Other problems related to life management difficulty: Secondary | ICD-10-CM | POA: Diagnosis not present

## 2018-12-21 DIAGNOSIS — M6281 Muscle weakness (generalized): Secondary | ICD-10-CM | POA: Diagnosis not present

## 2018-12-21 DIAGNOSIS — I10 Essential (primary) hypertension: Secondary | ICD-10-CM | POA: Diagnosis not present

## 2018-12-21 DIAGNOSIS — E669 Obesity, unspecified: Secondary | ICD-10-CM | POA: Diagnosis not present

## 2018-12-21 DIAGNOSIS — Z87440 Personal history of urinary (tract) infections: Secondary | ICD-10-CM | POA: Diagnosis not present

## 2018-12-21 DIAGNOSIS — E785 Hyperlipidemia, unspecified: Secondary | ICD-10-CM | POA: Diagnosis not present

## 2018-12-24 DIAGNOSIS — E669 Obesity, unspecified: Secondary | ICD-10-CM | POA: Diagnosis not present

## 2018-12-24 DIAGNOSIS — Z7389 Other problems related to life management difficulty: Secondary | ICD-10-CM | POA: Diagnosis not present

## 2018-12-24 DIAGNOSIS — I10 Essential (primary) hypertension: Secondary | ICD-10-CM | POA: Diagnosis not present

## 2018-12-24 DIAGNOSIS — Z87440 Personal history of urinary (tract) infections: Secondary | ICD-10-CM | POA: Diagnosis not present

## 2018-12-24 DIAGNOSIS — E785 Hyperlipidemia, unspecified: Secondary | ICD-10-CM | POA: Diagnosis not present

## 2018-12-24 DIAGNOSIS — N390 Urinary tract infection, site not specified: Secondary | ICD-10-CM | POA: Diagnosis not present

## 2018-12-24 DIAGNOSIS — M6281 Muscle weakness (generalized): Secondary | ICD-10-CM | POA: Diagnosis not present

## 2018-12-25 ENCOUNTER — Other Ambulatory Visit: Payer: Medicare Other

## 2018-12-25 ENCOUNTER — Other Ambulatory Visit: Payer: Self-pay

## 2018-12-25 DIAGNOSIS — N390 Urinary tract infection, site not specified: Secondary | ICD-10-CM

## 2018-12-25 DIAGNOSIS — E785 Hyperlipidemia, unspecified: Secondary | ICD-10-CM

## 2018-12-26 ENCOUNTER — Other Ambulatory Visit: Payer: Self-pay | Admitting: *Deleted

## 2018-12-26 ENCOUNTER — Other Ambulatory Visit: Payer: Self-pay

## 2018-12-26 DIAGNOSIS — R300 Dysuria: Secondary | ICD-10-CM

## 2018-12-26 DIAGNOSIS — M6281 Muscle weakness (generalized): Secondary | ICD-10-CM | POA: Diagnosis not present

## 2018-12-26 DIAGNOSIS — E669 Obesity, unspecified: Secondary | ICD-10-CM | POA: Diagnosis not present

## 2018-12-26 DIAGNOSIS — E785 Hyperlipidemia, unspecified: Secondary | ICD-10-CM | POA: Diagnosis not present

## 2018-12-26 DIAGNOSIS — Z87440 Personal history of urinary (tract) infections: Secondary | ICD-10-CM | POA: Diagnosis not present

## 2018-12-26 DIAGNOSIS — R3911 Hesitancy of micturition: Secondary | ICD-10-CM

## 2018-12-26 DIAGNOSIS — Z7389 Other problems related to life management difficulty: Secondary | ICD-10-CM | POA: Diagnosis not present

## 2018-12-26 DIAGNOSIS — I10 Essential (primary) hypertension: Secondary | ICD-10-CM | POA: Diagnosis not present

## 2018-12-26 LAB — LIPID PANEL
Cholesterol: 172 mg/dL (ref <200)
HDL: 49 mg/dL — ABNORMAL LOW (ref >=50)
LDL Cholesterol (Calc): 104 mg/dL (calc) — ABNORMAL HIGH
Non-HDL Cholesterol (Calc): 123 mg/dL (calc) (ref <130)
Total CHOL/HDL Ratio: 3.5 (calc) (ref <5.0)
Triglycerides: 99 mg/dL (ref <150)

## 2018-12-26 LAB — URINE CULTURE

## 2018-12-26 LAB — URINALYSIS

## 2018-12-27 ENCOUNTER — Other Ambulatory Visit: Payer: Medicare Other

## 2018-12-27 ENCOUNTER — Other Ambulatory Visit: Payer: Self-pay

## 2018-12-28 DIAGNOSIS — M6281 Muscle weakness (generalized): Secondary | ICD-10-CM | POA: Diagnosis not present

## 2018-12-28 DIAGNOSIS — E669 Obesity, unspecified: Secondary | ICD-10-CM | POA: Diagnosis not present

## 2018-12-28 DIAGNOSIS — E785 Hyperlipidemia, unspecified: Secondary | ICD-10-CM | POA: Diagnosis not present

## 2018-12-28 DIAGNOSIS — I10 Essential (primary) hypertension: Secondary | ICD-10-CM | POA: Diagnosis not present

## 2018-12-28 DIAGNOSIS — Z87440 Personal history of urinary (tract) infections: Secondary | ICD-10-CM | POA: Diagnosis not present

## 2018-12-28 DIAGNOSIS — Z7389 Other problems related to life management difficulty: Secondary | ICD-10-CM | POA: Diagnosis not present

## 2018-12-31 ENCOUNTER — Other Ambulatory Visit: Payer: Self-pay

## 2018-12-31 DIAGNOSIS — Z87440 Personal history of urinary (tract) infections: Secondary | ICD-10-CM | POA: Diagnosis not present

## 2018-12-31 DIAGNOSIS — M6281 Muscle weakness (generalized): Secondary | ICD-10-CM | POA: Diagnosis not present

## 2018-12-31 DIAGNOSIS — E785 Hyperlipidemia, unspecified: Secondary | ICD-10-CM | POA: Diagnosis not present

## 2018-12-31 DIAGNOSIS — I10 Essential (primary) hypertension: Secondary | ICD-10-CM | POA: Diagnosis not present

## 2018-12-31 DIAGNOSIS — Z7389 Other problems related to life management difficulty: Secondary | ICD-10-CM | POA: Diagnosis not present

## 2018-12-31 DIAGNOSIS — E669 Obesity, unspecified: Secondary | ICD-10-CM | POA: Diagnosis not present

## 2018-12-31 LAB — URINALYSIS
Bilirubin Urine: NEGATIVE
Glucose, UA: NEGATIVE
Hgb urine dipstick: NEGATIVE
Nitrite: NEGATIVE
Specific Gravity, Urine: 1.02 (ref 1.001–1.03)
pH: <=5 (ref 5.0–8.0)

## 2018-12-31 LAB — URINE CULTURE
MICRO NUMBER:: 400157
SPECIMEN QUALITY:: ADEQUATE

## 2018-12-31 MED ORDER — NITROFURANTOIN MONOHYD MACRO 100 MG PO CAPS: 100.0000 mg | ORAL_CAPSULE | Freq: Two times a day (BID) | ORAL | 0 refills | Status: AC

## 2018-12-31 MED ORDER — SACCHAROMYCES BOULARDII 250 MG PO CAPS: 250.0000 mg | ORAL_CAPSULE | Freq: Two times a day (BID) | ORAL | 0 refills | Status: AC

## 2019-01-01 ENCOUNTER — Telehealth: Payer: Self-pay | Admitting: *Deleted

## 2019-01-01 NOTE — Telephone Encounter (Signed)
Received call from resident regarding medication that was sent for UTI, explained to her that the correct phone number was not in the system and that is the reason she did'nt receive a call. She was questioning what foods that she could eat while taking this medication.

## 2019-01-04 DIAGNOSIS — M6281 Muscle weakness (generalized): Secondary | ICD-10-CM | POA: Diagnosis not present

## 2019-01-04 DIAGNOSIS — E785 Hyperlipidemia, unspecified: Secondary | ICD-10-CM | POA: Diagnosis not present

## 2019-01-04 DIAGNOSIS — I10 Essential (primary) hypertension: Secondary | ICD-10-CM | POA: Diagnosis not present

## 2019-01-04 DIAGNOSIS — E669 Obesity, unspecified: Secondary | ICD-10-CM | POA: Diagnosis not present

## 2019-01-04 DIAGNOSIS — Z7389 Other problems related to life management difficulty: Secondary | ICD-10-CM | POA: Diagnosis not present

## 2019-01-04 DIAGNOSIS — Z87440 Personal history of urinary (tract) infections: Secondary | ICD-10-CM | POA: Diagnosis not present

## 2019-01-07 DIAGNOSIS — M6281 Muscle weakness (generalized): Secondary | ICD-10-CM | POA: Diagnosis not present

## 2019-01-07 DIAGNOSIS — Z7389 Other problems related to life management difficulty: Secondary | ICD-10-CM | POA: Diagnosis not present

## 2019-01-07 DIAGNOSIS — Z87440 Personal history of urinary (tract) infections: Secondary | ICD-10-CM | POA: Diagnosis not present

## 2019-01-07 DIAGNOSIS — I10 Essential (primary) hypertension: Secondary | ICD-10-CM | POA: Diagnosis not present

## 2019-01-07 DIAGNOSIS — E669 Obesity, unspecified: Secondary | ICD-10-CM | POA: Diagnosis not present

## 2019-01-07 DIAGNOSIS — E785 Hyperlipidemia, unspecified: Secondary | ICD-10-CM | POA: Diagnosis not present

## 2019-01-09 DIAGNOSIS — E669 Obesity, unspecified: Secondary | ICD-10-CM | POA: Diagnosis not present

## 2019-01-09 DIAGNOSIS — I10 Essential (primary) hypertension: Secondary | ICD-10-CM | POA: Diagnosis not present

## 2019-01-09 DIAGNOSIS — M6281 Muscle weakness (generalized): Secondary | ICD-10-CM | POA: Diagnosis not present

## 2019-01-09 DIAGNOSIS — Z87440 Personal history of urinary (tract) infections: Secondary | ICD-10-CM | POA: Diagnosis not present

## 2019-01-09 DIAGNOSIS — E785 Hyperlipidemia, unspecified: Secondary | ICD-10-CM | POA: Diagnosis not present

## 2019-01-09 DIAGNOSIS — Z7389 Other problems related to life management difficulty: Secondary | ICD-10-CM | POA: Diagnosis not present

## 2019-01-11 DIAGNOSIS — Z87440 Personal history of urinary (tract) infections: Secondary | ICD-10-CM | POA: Diagnosis not present

## 2019-01-11 DIAGNOSIS — Z7389 Other problems related to life management difficulty: Secondary | ICD-10-CM | POA: Diagnosis not present

## 2019-01-11 DIAGNOSIS — E669 Obesity, unspecified: Secondary | ICD-10-CM | POA: Diagnosis not present

## 2019-01-11 DIAGNOSIS — M6281 Muscle weakness (generalized): Secondary | ICD-10-CM | POA: Diagnosis not present

## 2019-01-11 DIAGNOSIS — E785 Hyperlipidemia, unspecified: Secondary | ICD-10-CM | POA: Diagnosis not present

## 2019-01-11 DIAGNOSIS — I10 Essential (primary) hypertension: Secondary | ICD-10-CM | POA: Diagnosis not present

## 2019-01-14 DIAGNOSIS — I10 Essential (primary) hypertension: Secondary | ICD-10-CM | POA: Diagnosis not present

## 2019-01-14 DIAGNOSIS — M6281 Muscle weakness (generalized): Secondary | ICD-10-CM | POA: Diagnosis not present

## 2019-01-14 DIAGNOSIS — Z7389 Other problems related to life management difficulty: Secondary | ICD-10-CM | POA: Diagnosis not present

## 2019-01-14 DIAGNOSIS — E669 Obesity, unspecified: Secondary | ICD-10-CM | POA: Diagnosis not present

## 2019-01-14 DIAGNOSIS — E785 Hyperlipidemia, unspecified: Secondary | ICD-10-CM | POA: Diagnosis not present

## 2019-01-14 DIAGNOSIS — Z87440 Personal history of urinary (tract) infections: Secondary | ICD-10-CM | POA: Diagnosis not present

## 2019-01-31 DIAGNOSIS — L821 Other seborrheic keratosis: Secondary | ICD-10-CM | POA: Diagnosis not present

## 2019-01-31 DIAGNOSIS — L570 Actinic keratosis: Secondary | ICD-10-CM | POA: Diagnosis not present

## 2019-01-31 DIAGNOSIS — L814 Other melanin hyperpigmentation: Secondary | ICD-10-CM | POA: Diagnosis not present

## 2019-02-19 DIAGNOSIS — L570 Actinic keratosis: Secondary | ICD-10-CM | POA: Diagnosis not present

## 2019-02-19 DIAGNOSIS — Z85828 Personal history of other malignant neoplasm of skin: Secondary | ICD-10-CM | POA: Diagnosis not present

## 2019-02-19 DIAGNOSIS — L218 Other seborrheic dermatitis: Secondary | ICD-10-CM | POA: Diagnosis not present

## 2019-02-19 DIAGNOSIS — L814 Other melanin hyperpigmentation: Secondary | ICD-10-CM | POA: Diagnosis not present

## 2019-02-19 DIAGNOSIS — L821 Other seborrheic keratosis: Secondary | ICD-10-CM | POA: Diagnosis not present

## 2019-02-19 DIAGNOSIS — L820 Inflamed seborrheic keratosis: Secondary | ICD-10-CM | POA: Diagnosis not present

## 2019-03-26 ENCOUNTER — Emergency Department (HOSPITAL_COMMUNITY): Payer: Medicare Other

## 2019-03-26 ENCOUNTER — Encounter (HOSPITAL_COMMUNITY): Payer: Self-pay | Admitting: Internal Medicine

## 2019-03-26 ENCOUNTER — Other Ambulatory Visit: Payer: Self-pay

## 2019-03-26 ENCOUNTER — Observation Stay (HOSPITAL_COMMUNITY)
Admission: EM | Admit: 2019-03-26 | Discharge: 2019-03-27 | Disposition: A | Discharge status: Home / Self Care | Payer: Medicare Other | Attending: Internal Medicine | Admitting: Internal Medicine

## 2019-03-26 DIAGNOSIS — S299XXA Unspecified injury of thorax, initial encounter: Secondary | ICD-10-CM | POA: Diagnosis not present

## 2019-03-26 DIAGNOSIS — N390 Urinary tract infection, site not specified: Secondary | ICD-10-CM | POA: Diagnosis not present

## 2019-03-26 DIAGNOSIS — Z03818 Encounter for observation for suspected exposure to other biological agents ruled out: Secondary | ICD-10-CM | POA: Diagnosis not present

## 2019-03-26 DIAGNOSIS — I10 Essential (primary) hypertension: Secondary | ICD-10-CM | POA: Diagnosis present

## 2019-03-26 DIAGNOSIS — G40909 Epilepsy, unspecified, not intractable, without status epilepticus: Secondary | ICD-10-CM | POA: Diagnosis not present

## 2019-03-26 DIAGNOSIS — Y998 Other external cause status: Secondary | ICD-10-CM | POA: Insufficient documentation

## 2019-03-26 DIAGNOSIS — Z85828 Personal history of other malignant neoplasm of skin: Secondary | ICD-10-CM | POA: Insufficient documentation

## 2019-03-26 DIAGNOSIS — W0110XA Fall on same level from slipping, tripping and stumbling with subsequent striking against unspecified object, initial encounter: Secondary | ICD-10-CM | POA: Diagnosis not present

## 2019-03-26 DIAGNOSIS — R55 Syncope and collapse: Secondary | ICD-10-CM | POA: Diagnosis not present

## 2019-03-26 DIAGNOSIS — S0181XA Laceration without foreign body of other part of head, initial encounter: Principal | ICD-10-CM

## 2019-03-26 DIAGNOSIS — Y92121 Bathroom in nursing home as the place of occurrence of the external cause: Secondary | ICD-10-CM | POA: Diagnosis not present

## 2019-03-26 DIAGNOSIS — Y9389 Activity, other specified: Secondary | ICD-10-CM | POA: Insufficient documentation

## 2019-03-26 DIAGNOSIS — W19XXXA Unspecified fall, initial encounter: Secondary | ICD-10-CM

## 2019-03-26 DIAGNOSIS — R58 Hemorrhage, not elsewhere classified: Secondary | ICD-10-CM | POA: Diagnosis not present

## 2019-03-26 DIAGNOSIS — E785 Hyperlipidemia, unspecified: Secondary | ICD-10-CM | POA: Diagnosis present

## 2019-03-26 DIAGNOSIS — R4020 Unspecified coma: Secondary | ICD-10-CM | POA: Diagnosis present

## 2019-03-26 DIAGNOSIS — S0101XA Laceration without foreign body of scalp, initial encounter: Secondary | ICD-10-CM | POA: Diagnosis not present

## 2019-03-26 DIAGNOSIS — R569 Unspecified convulsions: Secondary | ICD-10-CM | POA: Diagnosis not present

## 2019-03-26 DIAGNOSIS — Z79899 Other long term (current) drug therapy: Secondary | ICD-10-CM | POA: Diagnosis not present

## 2019-03-26 DIAGNOSIS — S199XXA Unspecified injury of neck, initial encounter: Secondary | ICD-10-CM | POA: Diagnosis not present

## 2019-03-26 DIAGNOSIS — R0902 Hypoxemia: Secondary | ICD-10-CM | POA: Diagnosis not present

## 2019-03-26 DIAGNOSIS — S0993XA Unspecified injury of face, initial encounter: Secondary | ICD-10-CM | POA: Diagnosis not present

## 2019-03-26 LAB — CBC
HCT: 42.6 % (ref 36.0–46.0)
Hemoglobin: 13.2 g/dL (ref 12.0–15.0)
MCH: 28.4 pg (ref 26.0–34.0)
MCHC: 31 g/dL (ref 30.0–36.0)
MCV: 91.8 fL (ref 80.0–100.0)
Platelets: 272 10*3/uL (ref 150–400)
RBC: 4.64 MIL/uL (ref 3.87–5.11)
RDW: 14.8 % (ref 11.5–15.5)
WBC: 17.4 10*3/uL — ABNORMAL HIGH (ref 4.0–10.5)
nRBC: 0 % (ref 0.0–0.2)

## 2019-03-26 LAB — COMPREHENSIVE METABOLIC PANEL
ALT: 25 U/L (ref 0–44)
AST: 20 U/L (ref 15–41)
Albumin: 3.5 g/dL (ref 3.5–5.0)
Alkaline Phosphatase: 113 U/L (ref 38–126)
Anion gap: 8 (ref 5–15)
BUN: 17 mg/dL (ref 8–23)
CO2: 26 mmol/L (ref 22–32)
Calcium: 9.4 mg/dL (ref 8.9–10.3)
Chloride: 105 mmol/L (ref 98–111)
Creatinine, Ser: 0.99 mg/dL (ref 0.44–1.00)
GFR calc Af Amer: >60 mL/min (ref >60)
GFR calc non Af Amer: 54 mL/min — ABNORMAL LOW (ref >60)
Glucose, Bld: 96 mg/dL (ref 70–99)
Potassium: 3.5 mmol/L (ref 3.5–5.1)
Sodium: 139 mmol/L (ref 135–145)
Total Bilirubin: 0.4 mg/dL (ref 0.3–1.2)
Total Protein: 6.9 g/dL (ref 6.5–8.1)

## 2019-03-26 LAB — URINALYSIS, ROUTINE W REFLEX MICROSCOPIC
Bilirubin Urine: NEGATIVE
Glucose, UA: NEGATIVE mg/dL
Hgb urine dipstick: NEGATIVE
Ketones, ur: NEGATIVE mg/dL
Nitrite: NEGATIVE
Protein, ur: NEGATIVE mg/dL
Specific Gravity, Urine: 1.009 (ref 1.005–1.030)
Squamous Epithelial / HPF: >50 — ABNORMAL HIGH (ref 0–5)
WBC, UA: >50 WBC/hpf — ABNORMAL HIGH (ref 0–5)
pH: 8 (ref 5.0–8.0)

## 2019-03-26 LAB — SARS CORONAVIRUS 2 BY RT PCR (HOSPITAL ORDER, PERFORMED IN ~~LOC~~ HOSPITAL LAB): SARS Coronavirus 2: NEGATIVE

## 2019-03-26 LAB — TSH: TSH: 1.601 u[IU]/mL (ref 0.350–4.500)

## 2019-03-26 MED ORDER — ZONISAMIDE 100 MG PO CAPS
100.0000 mg | ORAL_CAPSULE | Freq: Every day | ORAL | Status: DC
  Administered 2019-03-26: 100 mg via ORAL
  Filled 2019-03-26 (×3): qty 1

## 2019-03-26 MED ORDER — SODIUM CHLORIDE 0.9% FLUSH
3.0000 mL | Freq: Two times a day (BID) | INTRAVENOUS | Status: DC
  Administered 2019-03-26 – 2019-03-27 (×2): 3 mL via INTRAVENOUS

## 2019-03-26 MED ORDER — ATORVASTATIN CALCIUM 10 MG PO TABS
20.0000 mg | ORAL_TABLET | Freq: Every day | ORAL | Status: DC
  Filled 2019-03-26: qty 2

## 2019-03-26 MED ORDER — ACETAMINOPHEN 650 MG RE SUPP: 650.0000 mg | Freq: Four times a day (QID) | RECTAL | Status: DC | PRN

## 2019-03-26 MED ORDER — ACETAMINOPHEN 325 MG PO TABS
650.0000 mg | ORAL_TABLET | Freq: Four times a day (QID) | ORAL | Status: DC | PRN
  Filled 2019-03-26: qty 2

## 2019-03-26 MED ORDER — LIDOCAINE-EPINEPHRINE (PF) 2 %-1:200000 IJ SOLN
10.0000 mL | Freq: Once | INTRAMUSCULAR | Status: AC
  Administered 2019-03-26: 10 mL
  Filled 2019-03-26: qty 20

## 2019-03-26 MED ORDER — LORAZEPAM 2 MG/ML IJ SOLN: 1.0000 mg | INTRAMUSCULAR | Status: DC | PRN

## 2019-03-26 MED ORDER — LEVOFLOXACIN IN D5W 500 MG/100ML IV SOLN
500.0000 mg | Freq: Once | INTRAVENOUS | Status: AC
  Administered 2019-03-26: 500 mg via INTRAVENOUS
  Filled 2019-03-26: qty 100

## 2019-03-26 MED ORDER — ENOXAPARIN SODIUM 40 MG/0.4ML ~~LOC~~ SOLN
40.0000 mg | SUBCUTANEOUS | Status: DC
  Administered 2019-03-26: 22:00:00 40 mg via SUBCUTANEOUS
  Filled 2019-03-26: qty 0.4

## 2019-03-26 MED ORDER — PANTOPRAZOLE SODIUM 40 MG PO TBEC: 40.0000 mg | DELAYED_RELEASE_TABLET | Freq: Every day | ORAL | Status: DC

## 2019-03-26 MED ORDER — SODIUM CHLORIDE 0.9 % IV SOLN
75.0000 mL/h | INTRAVENOUS | Status: DC
  Administered 2019-03-26 – 2019-03-27 (×2): 75 mL/h via INTRAVENOUS

## 2019-03-26 MED ORDER — ONDANSETRON HCL 4 MG/2ML IJ SOLN: 4.0000 mg | Freq: Four times a day (QID) | INTRAMUSCULAR | Status: DC | PRN

## 2019-03-26 MED ORDER — ZONISAMIDE 100 MG PO CAPS
100.0000 mg | ORAL_CAPSULE | Freq: Every day | ORAL | Status: DC
  Filled 2019-03-26: qty 1

## 2019-03-26 MED ORDER — ONDANSETRON HCL 4 MG PO TABS: 4.0000 mg | ORAL_TABLET | Freq: Four times a day (QID) | ORAL | Status: DC | PRN

## 2019-03-26 NOTE — H&P (Signed)
History and Physical    Katherine Obrien QIO:[id number] DOB: 10-11-1940 DOA: 03/26/2019  PCP: Mast, Man X, NP Consultants:  Doctors in [place]; moved into [place] in December Patient coming from: [place] [facility] [employer]; NOK: Daughter or son  Chief Complaint: LOC  HPI: Katherine Obrien is a 78 y.o. female with medical history significant of seizures; HTN; and HLD presenting with LOC.   She reports that she fell overnight.  She is uncertain whether she fainted or had a seizure.  The last thing she remembers is brushing her teeth and feeling fine.  She was in a rush because she had an appointment at the clinic for derm reasons.  The next thing she remembered was her cell phone ringing and she was on the floor.  She hit her forehead, face, and knocked teeth loose.  She was a little confused when she woke up but cleared quickly.  Now, she feels hungry   ED Course:  Thought she had a seizure but unsure - work up in the floor with her face smashed.  ?syncope vs. Seizure.  Missed Zonegran yesterday, took this AM.  Repairing lacerations, has broken teeth.  CTs negative.  Review of Systems: As per HPI; otherwise review of systems reviewed and negative.   Ambulatory Status:  Ambulates with a walker  Past Medical History:  Diagnosis Date   Arthritis    Cancer [facility])    skin   Cataract 2010   Dr. Alexander Mt, DrJerlyn Ly @ [facility]   Epilepsy [facility]) 08/11/2015   Hyperlipidemia 12/08/2015   Hypertension    Polyp of colon 08/11/2015    Past Surgical History:  Procedure Laterality Date   ABDOMINAL HYSTERECTOMY  [place]   COLON SURGERY  09/12/2006   FEMUR SURGERY  2018   Broken   TONSILLECTOMY  09/12/1944    Social History   Socioeconomic History   Marital status: Widowed    Spouse name: Not on file   Number of children: Not on file   Years of education: Not on file   Highest education level: Not on file    Occupational History   Occupation: Electrical engineer    Comment: retired  Scientist, product/process development strain: Not on file   Food insecurity    Worry: Not on file    Inability: Not on Lexicographer needs    Medical: Not on file    Non-medical: Not on file  Tobacco Use   Smoking status: Never Smoker   Smokeless tobacco: Never Used  Substance and Sexual Activity   Alcohol use: Never    Frequency: Never   Drug use: Never   Sexual activity: Not on file  Lifestyle   Physical activity    Days per week: Not on file    Minutes per session: Not on file   Stress: Not on file  Relationships   Social connections    Talks on phone: Not on file    Gets together: Not on file    Attends religious service: Not on file    Active member of club or organization: Not on file    Attends meetings of clubs or organizations: Not on file    Relationship status: Not on file   Intimate partner violence    Fear of current or ex partner: Not on file    Emotionally abused: Not on file    Physically abused: Not  on file    Forced sexual activity: Not on file  Other Topics Concern   Not on file  Social History Narrative   Social History     Socioeconomic History       Marital status: Widowed       Number of children:2       Years of education:  AB in [employer] [id number])       Highest education level: Bachelors in      Occupational History : Engineer, 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       Food insecurity:         Worry: Not on file         Inability: Not on file       Transportation needs:         Medical: Not on file         Non-medical: Not on file     Tobacco Use       Smoking status: Not on file     Substance and Sexual Activity       Alcohol use: Not on file       Drug use: Not on file       Sexual activity: Not on file     Lifestyle       Physical activity:         Days per week: Not on file         Minutes per  session: A little       Stress: Not on file     Relationships       Social connections:         Talks on phone: Not on file         Gets together: Not on file         Attends religious service: Not on file         Active member of club or organization: Not on file         Attends meetings of clubs or organizations: Not on file         Relationship status: Not on file       Intimate partner violence:         Fear of current or ex partner: Not on file         Emotionally abused: Not on file         Physically abused: Not on file         Forced sexual activity: Not on file     Other Topics       Concerns:         Not on file     Social History Narrative       Not on file       Allergies  Allergen Reactions   Barbiturates Other (See Comments)    A small amount "overdosed" the patient- "affected my stability and balance" (might have been given at the same as another med?)   Cefdinir Other (See Comments)    A small amount "overdosed" the patient- "affected my stability and balance" (might have been given at the same as another med?)   Phenobarbital Other (See Comments)    A small amount "overdosed" the patient- "affected my stability and balance" (might have been given at the same as another med?)   Tegretol [Carbamazepine] Other (See Comments)  A small amount "overdosed" the patient- "affected my stability and balance" (might have been given at the same as another med?)    Family History  Problem Relation Age of Onset   Osteoporosis Mother    Dementia Mother    Arthritis Mother    Stroke Father 24       mini   Dementia Father    CVA Father    Cancer Sister        breast   Dementia Sister        severe   Alzheimer's disease Sister    Diabetes Sister    Hypertension Sister    Stroke Paternal Grandfather        1970    Prior to Admission medications   Medication Sig Start Date End Date Taking? Authorizing Provider  atorvastatin (LIPITOR) 20 MG tablet  Take 1 tablet (20 mg total) by mouth daily. 12/20/18   Mast, Man X, NP  Multiple Vitamins-Minerals (MULTIVITAMIN WITH MINERALS) tablet Take 1 tablet by mouth daily.    [provider]  omeprazole (PRILOSEC) 20 MG capsule Take 20 mg by mouth daily.    [provider]  Polyethyl Glycol-Propyl Glycol (SYSTANE) 0.4-0.3 % SOLN Apply 1 drop to eye as needed (dry eye).     [provider]  Sodium Fluoride (PREVIDENT 5000 BOOSTER PLUS) 1.1 % PSTE Place onto teeth. Brush teeth  daily    [provider]  Specialty Vitamins Products (ICAPS LUTEIN-ZEAXANTHIN PO) Take 1 tablet by mouth daily.    [provider]  Vitamin D, Cholecalciferol, 25 MCG (1000 UT) TABS Take 25 mcg by mouth daily.    [provider]  zonisamide (ZONEGRAN) 100 MG capsule Take 100 mg by mouth at bedtime.    [provider]    Physical Exam: Vitals:   03/26/19 1630 03/26/19 1645 03/26/19 1700 03/26/19 1728  BP: 131/66 133/74 (!) 129/59 136/68  Pulse: 86 84 83 85  Resp: 18 17 17    Temp:    98.3 F (36.8 C)  TempSrc:    Oral  SpO2: 98% 98% 99% 99%  Weight:      Height:          General:  Appears calm and comfortable and is NAD; stellate lesion on her forehead  Eyes:  PERRL, EOMI, normal lids, iris  ENT:  grossly normal hearing, lips & tongue, mmm; poor baseline dentition with now absent front teeth from overnight trauma  Neck:  no LAD, masses or thyromegaly  Cardiovascular:  RRR, no m/r/g. No LE edema.   Respiratory:   CTA bilaterally with no wheezes/rales/rhonchi.  Normal respiratory effort.  Abdomen:  soft, NT, ND, NABS  Skin:  no rash or induration seen on limited exam  Musculoskeletal:  grossly normal tone BUE/BLE, good ROM, no bony abnormality  Psychiatric:  grossly normal mood and affect, speech fluent and appropriate, AOx3  Neurologic:  CN 2-12 grossly intact, moves all extremities in coordinated fashion, sensation intact    Radiological Exams  on Admission: Ct Head Wo Contrast  Result Date: 03/26/2019 CLINICAL DATA:  Facial trauma. EXAM: CT HEAD WITHOUT CONTRAST CT MAXILLOFACIAL WITHOUT CONTRAST CT CERVICAL SPINE WITHOUT CONTRAST TECHNIQUE: Multidetector CT imaging of the head, cervical spine, and maxillofacial structures were performed using the standard protocol without intravenous contrast. Multiplanar CT image reconstructions of the cervical spine and maxillofacial structures were also generated. COMPARISON:  None. FINDINGS: CT HEAD FINDINGS Brain: No evidence of acute infarction, hemorrhage, hydrocephalus, extra-axial collection or mass  lesion/mass effect. Small old lacunar infarction is identified in the right basal ganglia. There is chronic diffuse atrophy. Chronic bilateral periventricular white matter small vessel ischemic changes noted. Vascular: No hyperdense vessel is noted. Skull: Normal. Negative for fracture or focal lesion. Other: A midline frontal scalp laceration with air and swelling of soft tissue. CT MAXILLOFACIAL FINDINGS Osseous: No fracture or mandibular dislocation. No destructive process. Orbits: Negative. No traumatic or inflammatory finding. Sinuses: Mild mucoperiosteal thickening the right maxillary sinus is identified. Bilateral ostiomeatal complexes are patent. Soft tissues: Negative. CT CERVICAL SPINE FINDINGS Alignment: Normal. Skull base and vertebrae: No acute fracture. No primary bone lesion or focal pathologic process. Soft tissues and spinal canal: No prevertebral fluid or swelling. No visible canal hematoma. Disc levels: Degenerative joint changes with narrowed joint space and osteophyte formation identified in the mid and in the lower cervical spine. Upper chest: Negative. Other: None. IMPRESSION: No focal acute intracranial abnormality identified. Midline frontal scalp laceration with air and swelling of the soft tissues. No acute fracture or dislocation of maxillofacial bones and cervical spine. Electronically  Signed   By: Abelardo Diesel M.D.   On: 03/26/2019 11:35   Ct Cervical Spine Wo Contrast  Result Date: 03/26/2019 CLINICAL DATA:  Facial trauma. EXAM: CT HEAD WITHOUT CONTRAST CT MAXILLOFACIAL WITHOUT CONTRAST CT CERVICAL SPINE WITHOUT CONTRAST TECHNIQUE: Multidetector CT imaging of the head, cervical spine, and maxillofacial structures were performed using the standard protocol without intravenous contrast. Multiplanar CT image reconstructions of the cervical spine and maxillofacial structures were also generated. COMPARISON:  None. FINDINGS: CT HEAD FINDINGS Brain: No evidence of acute infarction, hemorrhage, hydrocephalus, extra-axial collection or mass lesion/mass effect. Small old lacunar infarction is identified in the right basal ganglia. There is chronic diffuse atrophy. Chronic bilateral periventricular white matter small vessel ischemic changes noted. Vascular: No hyperdense vessel is noted. Skull: Normal. Negative for fracture or focal lesion. Other: A midline frontal scalp laceration with air and swelling of soft tissue. CT MAXILLOFACIAL FINDINGS Osseous: No fracture or mandibular dislocation. No destructive process. Orbits: Negative. No traumatic or inflammatory finding. Sinuses: Mild mucoperiosteal thickening the right maxillary sinus is identified. Bilateral ostiomeatal complexes are patent. Soft tissues: Negative. CT CERVICAL SPINE FINDINGS Alignment: Normal. Skull base and vertebrae: No acute fracture. No primary bone lesion or focal pathologic process. Soft tissues and spinal canal: No prevertebral fluid or swelling. No visible canal hematoma. Disc levels: Degenerative joint changes with narrowed joint space and osteophyte formation identified in the mid and in the lower cervical spine. Upper chest: Negative. Other: None. IMPRESSION: No focal acute intracranial abnormality identified. Midline frontal scalp laceration with air and swelling of the soft tissues. No acute fracture or dislocation of  maxillofacial bones and cervical spine. Electronically Signed   By: Abelardo Diesel M.D.   On: 03/26/2019 11:35   Dg Chest Port 1 View  Result Date: 03/26/2019 CLINICAL DATA:  Fall.  Seizure. EXAM: PORTABLE CHEST 1 VIEW COMPARISON:  None. FINDINGS: The heart size and mediastinal contours are within normal limits. Both lungs are clear. The visualized skeletal structures are unremarkable. IMPRESSION: No active disease. Electronically Signed   By: Monte Fantasia M.D.   On: 03/26/2019 11:00   Ct Maxillofacial Wo Contrast  Result Date: 03/26/2019 CLINICAL DATA:  Facial trauma. EXAM: CT HEAD WITHOUT CONTRAST CT MAXILLOFACIAL WITHOUT CONTRAST CT CERVICAL SPINE WITHOUT CONTRAST TECHNIQUE: Multidetector CT imaging of the head, cervical spine, and maxillofacial structures were performed using the standard protocol without intravenous contrast. Multiplanar CT image reconstructions  of the cervical spine and maxillofacial structures were also generated. COMPARISON:  None. FINDINGS: CT HEAD FINDINGS Brain: No evidence of acute infarction, hemorrhage, hydrocephalus, extra-axial collection or mass lesion/mass effect. Small old lacunar infarction is identified in the right basal ganglia. There is chronic diffuse atrophy. Chronic bilateral periventricular white matter small vessel ischemic changes noted. Vascular: No hyperdense vessel is noted. Skull: Normal. Negative for fracture or focal lesion. Other: A midline frontal scalp laceration with air and swelling of soft tissue. CT MAXILLOFACIAL FINDINGS Osseous: No fracture or mandibular dislocation. No destructive process. Orbits: Negative. No traumatic or inflammatory finding. Sinuses: Mild mucoperiosteal thickening the right maxillary sinus is identified. Bilateral ostiomeatal complexes are patent. Soft tissues: Negative. CT CERVICAL SPINE FINDINGS Alignment: Normal. Skull base and vertebrae: No acute fracture. No primary bone lesion or focal pathologic process. Soft tissues  and spinal canal: No prevertebral fluid or swelling. No visible canal hematoma. Disc levels: Degenerative joint changes with narrowed joint space and osteophyte formation identified in the mid and in the lower cervical spine. Upper chest: Negative. Other: None. IMPRESSION: No focal acute intracranial abnormality identified. Midline frontal scalp laceration with air and swelling of the soft tissues. No acute fracture or dislocation of maxillofacial bones and cervical spine. Electronically Signed   By: Abelardo Diesel M.D.   On: 03/26/2019 11:35    EKG: Independently reviewed.  NSR with rate 94 nonspecific ST changes with no evidence of acute ischemia   Labs on Admission: I have personally reviewed the available labs and imaging studies at the time of the admission.  Pertinent labs:   Unremarkable CMP WBC 17.4 UA: large LE, few bacteria, >50 RBC and WBC Urine culture from 4/15 showed Enterococcus faecalis, sensitive to Amp, Nitrofurantoin, and Vanc  Assessment/Plan Principal Problem:   Loss of consciousness (HCC) Active Problems:   Seizure disorder (HCC)   Hyperlipidemia   UTI (urinary tract infection)   Essential hypertension   LOC -Patient had LOC at home this AM -Possible seizure, as she does have seizure d/o and did not take Zonegran last night -Possible syncope, since she is uncertain of history -Will monitor overnight on telemetry -Neuro checks  -Gentle IVF hydration  Seizure disorder -Patient with known h/o seizures, possible seizure this AM -Patient placed in observation overnight for further evaluation -Resume Zonegran -Seizure precautions -Ativan prn  Possible UTI -Patient with prior h/o UTI in April -She reports ongoing urinary hesitation since without other UTI symptoms -She was given 1 dose of Levaquin in the ER this AM -Will await urine culture to determine if ongoing treatment is indicated -She would like outpatient urology evaluation  HTN -She does not  appear to be taking medication for this issue at this time  HLD -Continue Lipitor   Note: This patient has been tested and is negative for the novel coronavirus COVID-19.  DVT prophylaxis:  Lovenox  Code Status: Discussed with patient and she was very uncertain.  However, upon review of ACP documents she was very clearly DNR and so DNR ordered.  The patient subsequently requested to change to Full Code. Family Communication: None present; I attempted to call her daughter without an answer and then spoke at length with her son by telephone Disposition Plan:  Home once clinically improved Consults called: None  Admission status: It is my clinical opinion that referral for OBSERVATION is reasonable and necessary in this patient based on the above information provided. The aforementioned taken together are felt to place the patient at high risk for  further clinical deterioration. However it is anticipated that the patient may be medically stable for discharge from the hospital within 24 to 48 hours.    Karmen Bongo MD [facility]   How to contact the [facility] Attending or Consulting provider [place] or covering provider during after hours [place], for this patient?  1. Check the care team in [facility] and look for a) attending/consulting [facility] provider listed and b) the [facility] team listed 2. Log into [email or web address] and use 's universal password to access. If you do not have the password, please contact the hospital operator. 3. Locate the [facility] provider you are looking for under [facility] and page to a number that you can be directly reached. 4. If you still have difficulty reaching the provider, please page the [facility] (Director on Call) for the Hospitalists listed on amion for assistance.   03/26/2019, 6:27 PM

## 2019-03-26 NOTE — ED Provider Notes (Addendum)
[facility] EMERGENCY DEPARTMENT Provider Note   CSN: [id number] Arrival date & time: 03/26/19  1019     History   Chief Complaint Chief Complaint  Patient presents with   Fall    possible seizure    HPI Katherine Obrien is a 78 y.o. female.     HPI  78 year old female history of seizure disorder presents today after fall versus seizure.  She states that she was up this morning and then she found herself on the bathroom floor. she felt somewhat confused after she Woke up.  She is in independent living at friend's home.  She is complaining of laceration to the forehead and mouth injury with broken teeth.  She denies any symptoms prior to the event.  She did miss a dose of Zonegran this morning.  She has had a urinary tract infection in the spring and is not sure whether or not has ever cleared up.  She denies any abdominal pain, nausea, vomiting, diarrhea, fever, chills, or known exposure to COVID.  She does not think she lost control of her bladder.  It is difficult to assess intra-oral injury due to tooth injury from fall. Past Medical History:  Diagnosis Date   Arthritis    Cancer [facility])    skin   Cataract 2010   Dr. Alexander Mt, Dr.. Jerlyn Ly @ [facility]   Epilepsy [facility]) 08/11/2015   Hyperlipidemia 12/08/2015   Hypertension    Polyp of colon 08/11/2015   Seizures ([place])     Patient Active Problem List   Diagnosis Date Noted   Actinic keratoses 12/20/2018   Elevated systolic blood pressure reading without diagnosis of hypertension 11/16/2018   UTI (urinary tract infection) 10/08/2018   Adynamic ileus ([place]) 10/08/2018   Diarrhea 10/05/2018   GERD (gastroesophageal reflux disease) 10/05/2018   Urinary hesitancy 10/05/2018   Advanced care planning/counseling discussion 10/05/2018   Hyperlipidemia 09/19/2018   Gait abnormality 09/17/2018   Macular degeneration 09/17/2018   Dry eyes 09/17/2018   History of diverticulitis 09/17/2018   Seizure  disorder ([place]) 09/17/2018   Hx of vertebral fracture repair 09/17/2018   Obese 09/17/2018    Past Surgical History:  Procedure Laterality Date   [place]   COLON SURGERY  09/12/2006   FEMUR SURGERY  2018   Broken   TONSILLECTOMY  09/12/1944     OB History   No obstetric history on file.      Home Medications    Prior to Admission medications   Medication Sig Start Date End Date Taking? Authorizing Provider  atorvastatin (LIPITOR) 20 MG tablet Take 1 tablet (20 mg total) by mouth daily. 12/20/18   Mast, Man X, NP  Multiple Vitamins-Minerals (MULTIVITAMIN WITH MINERALS) tablet Take 1 tablet by mouth daily.    [provider]  omeprazole (PRILOSEC) 20 MG capsule Take 20 mg by mouth daily.    [provider]  Polyethyl Glycol-Propyl Glycol (SYSTANE) 0.4-0.3 % SOLN Apply 1 drop to eye as needed (dry eye).     [provider]  Sodium Fluoride (PREVIDENT 5000 BOOSTER PLUS) 1.1 % PSTE Place onto teeth. Brush teeth  daily    [provider]  Specialty Vitamins Products (ICAPS LUTEIN-ZEAXANTHIN PO) Take 1 tablet by mouth daily.    [provider]  Vitamin D, Cholecalciferol, 25 MCG (1000 UT) TABS Take 25 mcg by mouth daily.    [provider]  zonisamide (  ZONEGRAN) 100 MG capsule Take 100 mg by mouth at bedtime.    [provider]    Family History Family History  Problem Relation Age of Onset   Osteoporosis Mother    Dementia Mother    Arthritis Mother    Stroke Father 35       mini   Dementia Father    CVA Father    Cancer Sister        breast   Dementia Sister        severe   Alzheimer's disease Sister    Diabetes Sister    Hypertension Sister    Stroke Paternal Grandfather        1970    Social History Social History   Tobacco Use   Smoking status: Never Smoker   Smokeless tobacco: Never Used  Substance Use Topics     Alcohol use: Never    Frequency: Never   Drug use: Never     Allergies   Barbiturates, Cefdinir, Phenobarbital, and Tegretol [carbamazepine]   Review of Systems Review of Systems  All other systems reviewed and are negative.    Physical Exam Updated Vital Signs BP (!) 142/73    Pulse 87    Temp 97.7 F (36.5 C) (Rectal)    Resp 15    Ht 1.6 m (5\' 3" )    Wt 83.9 kg    SpO2 98%    BMI 32.77 kg/m   Physical Exam Vitals signs and nursing note reviewed.  Constitutional:      Appearance: She is obese.  HENT:     Head: Normocephalic.     Comments: Stellate forehead laceration totaling 8 cm Abrasion on bridge of left nose Laceration to upper lip Dental fractures of frontal incisor lateral frontal incisor and right    Right Ear: External ear normal.     Left Ear: External ear normal.     Nose: Nose normal.     Mouth/Throat:     Mouth: Mucous membranes are moist.  Eyes:     Extraocular Movements: Extraocular movements intact.     Pupils: Pupils are equal, round, and reactive to light.  Neck:     Musculoskeletal: Normal range of motion.  Cardiovascular:     Rate and Rhythm: Normal rate and regular rhythm.  Pulmonary:     Effort: Pulmonary effort is normal.  Abdominal:     Palpations: Abdomen is soft.  Musculoskeletal: Normal range of motion.  Skin:    General: Skin is warm and dry.     Capillary Refill: Capillary refill takes less than 2 seconds.  Neurological:     General: No focal deficit present.     Mental Status: She is alert and oriented to person, place, and time. Mental status is at baseline.     Cranial Nerves: No cranial nerve deficit.     Motor: No weakness.     Gait: Gait normal.  Psychiatric:        Mood and Affect: Mood normal.        Behavior: Behavior normal.      ED Treatments / Results  Labs (all labs ordered are listed, but only abnormal results are displayed) Labs Reviewed  CBC - Abnormal; Notable for the following components:       Result Value   WBC 17.4 (*)    All other components within normal limits  COMPREHENSIVE METABOLIC PANEL - Abnormal; Notable for the following components:   GFR calc non Af Amer 54 (*)  All other components within normal limits  URINALYSIS, ROUTINE W REFLEX MICROSCOPIC - Abnormal; Notable for the following components:   APPearance CLOUDY (*)    Leukocytes,Ua LARGE (*)    WBC, UA >50 (*)    Bacteria, UA FEW (*)    Squamous Epithelial / LPF >50 (*)    All other components within normal limits    EKG EKG Interpretation  Date/Time:  Tuesday March 26 2019 10:26:06 EDT Ventricular Rate:  94 PR Interval:    QRS Duration: 101 QT Interval:  364 QTC Calculation: 456 R Axis:   65 Text Interpretation:  Sinus rhythm Prolonged PR interval Right atrial enlargement RSR' in V1 or V2, probably normal variant No old tracing to compare Confirmed by Pattricia Boss [phone]) on 03/26/2019 12:53:59 PM   Radiology Ct Head Wo Contrast  Result Date: 03/26/2019 CLINICAL DATA:  Facial trauma. EXAM: CT HEAD WITHOUT CONTRAST CT MAXILLOFACIAL WITHOUT CONTRAST CT CERVICAL SPINE WITHOUT CONTRAST TECHNIQUE: Multidetector CT imaging of the head, cervical spine, and maxillofacial structures were performed using the standard protocol without intravenous contrast. Multiplanar CT image reconstructions of the cervical spine and maxillofacial structures were also generated. COMPARISON:  None. FINDINGS: CT HEAD FINDINGS Brain: No evidence of acute infarction, hemorrhage, hydrocephalus, extra-axial collection or mass lesion/mass effect. Small old lacunar infarction is identified in the right basal ganglia. There is chronic diffuse atrophy. Chronic bilateral periventricular white matter small vessel ischemic changes noted. Vascular: No hyperdense vessel is noted. Skull: Normal. Negative for fracture or focal lesion. Other: A midline frontal scalp laceration with air and swelling of soft tissue. CT MAXILLOFACIAL FINDINGS Osseous: No  fracture or mandibular dislocation. No destructive process. Orbits: Negative. No traumatic or inflammatory finding. Sinuses: Mild mucoperiosteal thickening the right maxillary sinus is identified. Bilateral ostiomeatal complexes are patent. Soft tissues: Negative. CT CERVICAL SPINE FINDINGS Alignment: Normal. Skull base and vertebrae: No acute fracture. No primary bone lesion or focal pathologic process. Soft tissues and spinal canal: No prevertebral fluid or swelling. No visible canal hematoma. Disc levels: Degenerative joint changes with narrowed joint space and osteophyte formation identified in the mid and in the lower cervical spine. Upper chest: Negative. Other: None. IMPRESSION: No focal acute intracranial abnormality identified. Midline frontal scalp laceration with air and swelling of the soft tissues. No acute fracture or dislocation of maxillofacial bones and cervical spine. Electronically Signed   By: Abelardo Diesel M.D.   On: 03/26/2019 11:35   Ct Cervical Spine Wo Contrast  Result Date: 03/26/2019 CLINICAL DATA:  Facial trauma. EXAM: CT HEAD WITHOUT CONTRAST CT MAXILLOFACIAL WITHOUT CONTRAST CT CERVICAL SPINE WITHOUT CONTRAST TECHNIQUE: Multidetector CT imaging of the head, cervical spine, and maxillofacial structures were performed using the standard protocol without intravenous contrast. Multiplanar CT image reconstructions of the cervical spine and maxillofacial structures were also generated. COMPARISON:  None. FINDINGS: CT HEAD FINDINGS Brain: No evidence of acute infarction, hemorrhage, hydrocephalus, extra-axial collection or mass lesion/mass effect. Small old lacunar infarction is identified in the right basal ganglia. There is chronic diffuse atrophy. Chronic bilateral periventricular white matter small vessel ischemic changes noted. Vascular: No hyperdense vessel is noted. Skull: Normal. Negative for fracture or focal lesion. Other: A midline frontal scalp laceration with air and swelling  of soft tissue. CT MAXILLOFACIAL FINDINGS Osseous: No fracture or mandibular dislocation. No destructive process. Orbits: Negative. No traumatic or inflammatory finding. Sinuses: Mild mucoperiosteal thickening the right maxillary sinus is identified. Bilateral ostiomeatal complexes are patent. Soft tissues: Negative. CT CERVICAL SPINE FINDINGS Alignment: Normal.  Skull base and vertebrae: No acute fracture. No primary bone lesion or focal pathologic process. Soft tissues and spinal canal: No prevertebral fluid or swelling. No visible canal hematoma. Disc levels: Degenerative joint changes with narrowed joint space and osteophyte formation identified in the mid and in the lower cervical spine. Upper chest: Negative. Other: None. IMPRESSION: No focal acute intracranial abnormality identified. Midline frontal scalp laceration with air and swelling of the soft tissues. No acute fracture or dislocation of maxillofacial bones and cervical spine. Electronically Signed   By: Abelardo Diesel M.D.   On: 03/26/2019 11:35   Dg Chest Port 1 View  Result Date: 03/26/2019 CLINICAL DATA:  Fall.  Seizure. EXAM: PORTABLE CHEST 1 VIEW COMPARISON:  None. FINDINGS: The heart size and mediastinal contours are within normal limits. Both lungs are clear. The visualized skeletal structures are unremarkable. IMPRESSION: No active disease. Electronically Signed   By: Monte Fantasia M.D.   On: 03/26/2019 11:00   Ct Maxillofacial Wo Contrast  Result Date: 03/26/2019 CLINICAL DATA:  Facial trauma. EXAM: CT HEAD WITHOUT CONTRAST CT MAXILLOFACIAL WITHOUT CONTRAST CT CERVICAL SPINE WITHOUT CONTRAST TECHNIQUE: Multidetector CT imaging of the head, cervical spine, and maxillofacial structures were performed using the standard protocol without intravenous contrast. Multiplanar CT image reconstructions of the cervical spine and maxillofacial structures were also generated. COMPARISON:  None. FINDINGS: CT HEAD FINDINGS Brain: No evidence of acute  infarction, hemorrhage, hydrocephalus, extra-axial collection or mass lesion/mass effect. Small old lacunar infarction is identified in the right basal ganglia. There is chronic diffuse atrophy. Chronic bilateral periventricular white matter small vessel ischemic changes noted. Vascular: No hyperdense vessel is noted. Skull: Normal. Negative for fracture or focal lesion. Other: A midline frontal scalp laceration with air and swelling of soft tissue. CT MAXILLOFACIAL FINDINGS Osseous: No fracture or mandibular dislocation. No destructive process. Orbits: Negative. No traumatic or inflammatory finding. Sinuses: Mild mucoperiosteal thickening the right maxillary sinus is identified. Bilateral ostiomeatal complexes are patent. Soft tissues: Negative. CT CERVICAL SPINE FINDINGS Alignment: Normal. Skull base and vertebrae: No acute fracture. No primary bone lesion or focal pathologic process. Soft tissues and spinal canal: No prevertebral fluid or swelling. No visible canal hematoma. Disc levels: Degenerative joint changes with narrowed joint space and osteophyte formation identified in the mid and in the lower cervical spine. Upper chest: Negative. Other: None. IMPRESSION: No focal acute intracranial abnormality identified. Midline frontal scalp laceration with air and swelling of the soft tissues. No acute fracture or dislocation of maxillofacial bones and cervical spine. Electronically Signed   By: Abelardo Diesel M.D.   On: 03/26/2019 11:35    Procedures Procedures (including critical care time)  Medications Ordered in ED Medications  zonisamide (ZONEGRAN) capsule 100 mg (100 mg Oral Not Given 03/26/19 1317)  levofloxacin (LEVAQUIN) IVPB 500 mg (500 mg Intravenous New Bag/Given 03/26/19 1316)  lidocaine-EPINEPHrine (XYLOCAINE W/EPI) 2 %-1:200000 (PF) injection 10 mL (10 mLs Infiltration Given 03/26/19 1054)     Initial Impression / Assessment and Plan / ED Course  I have reviewed the triage vital signs and  the nursing notes.  Pertinent labs & imaging results that were available during my care of the patient were reviewed by me and considered in my medical decision making (see chart for details).      78 year old female with syncope versus seizure today.  She does admit to medicine missing a dose of her Zonegran.  She reports taking it prior to coming in.  Here she has a leukocytosis and urine  with greater than 50 white blood cells.  Head CT, neck CT, and maxillofacial CT without any evidence of acute fracture or abnormality Plan IV antibiotics.  Patient given Levaquin as she is allergic to cephalosporins.  Urine will be cultured.  Facial laceration being repaired here in ED Please see laceration note Plan consult for admission Discussed with Dr. Lorin Mercy and will see for admission Final Clinical Impressions(s) / ED Diagnoses   Final diagnoses:  Fall, initial encounter  Urinary tract infection without hematuria, site unspecified  Facial laceration, initial encounter    ED Discharge Orders    None       Pattricia Boss, MD 03/26/19 1414    Pattricia Boss, MD 03/26/19 1437

## 2019-03-26 NOTE — ED Notes (Signed)
ED TO INPATIENT HANDOFF REPORT  ED Nurse Name and Phone #:  Elmyra Ricks [phone]  S Name/Age/Gender Katherine Obrien 78 y.o. female Room/Bed: 032C/032C  Code Status   Code Status: Not on file  Home/SNF/Other Home Patient oriented to: self, place, time and situation Is this baseline? Yes   Triage Complete: Triage complete  Chief Complaint Fall  Triage Note Pt here from independent living at [facility]. Pt thinks she had a seizure, she was "walking to the bathroom and the next thing she knew she was on the floor," and thinks that she lost consciousness. Pt has lac to forehead and knocked out two teeth. Hx seizures but has not had one in a while. Missed seizure med dose last night but took dose this morning. Neck stiffness and headache. A/o x 4 on arrival.    Allergies Allergies  Allergen Reactions  . Barbiturates   . Cefdinir   . Phenobarbital   . Tegretol [Carbamazepine]     Level of Care/Admitting Diagnosis ED Disposition    ED Disposition Condition [facility] Area: [place] [100100]  Level of Care: Telemetry Medical [104]  I expect the patient will be discharged within 24 hours: Yes  LOW acuity---Tx typically complete <24 hrs---ACUTE conditions typically can be evaluated <24 hours---LABS likely to return to acceptable levels <24 hours---IS near functional baseline---EXPECTED to return to current living arrangement---NOT newly hypoxic: Meets criteria for 5C-Observation unit  Covid Evaluation: Asymptomatic Screening Protocol (No Symptoms)  Diagnosis: Loss of consciousness ([place]) [585277]  Admitting Physician: Karmen Bongo [[id number]]  Attending Physician: Karmen Bongo [[id number]]  PT Class (Do Not Modify): Observation [104]  PT Acc Code (Do Not Modify): Observation [10022]       B Medical/Surgery History Past Medical History:  Diagnosis Date  . Arthritis   . Cancer ([place])    skin  . Cataract 2010   Dr. Alexander Mt, DrJerlyn Ly @ [place]  .  Epilepsy ([place]) 08/11/2015  . Hyperlipidemia 12/08/2015  . Hypertension   . Polyp of colon 08/11/2015   Past Surgical History:  Procedure Laterality Date  . ABDOMINAL HYSTERECTOMY  1970  . APPENDECTOMY  1958  . BREAST SURGERY  1984  . COLON SURGERY  09/12/2006  . FEMUR SURGERY  2018   Broken  . TONSILLECTOMY  09/12/1944     A IV Location/Drains/Wounds Patient Lines/Drains/Airways Status   Active Line/Drains/Airways    Name:   Placement date:   Placement time:   Site:   Days:   Peripheral IV 03/26/19 Right Antecubital   03/26/19    1022    Antecubital   less than 1          Intake/Output Last 24 hours  Intake/Output Summary (Last 24 hours) at 03/26/2019 1501 Last data filed at 03/26/2019 1429 Gross per 24 hour  Intake 100 ml  Output -  Net 100 ml    Labs/Imaging Results for orders placed or performed during the hospital encounter of 03/26/19 (from the past 48 hour(s))  Urinalysis, Routine w reflex microscopic     Status: Abnormal   Collection Time: 03/26/19 10:41 AM  Result Value Ref Range   Color, Urine YELLOW YELLOW   APPearance CLOUDY (A) CLEAR   Specific Gravity, Urine 1.009 1.005 - 1.030   pH 8.0 5.0 - 8.0   Glucose, UA NEGATIVE NEGATIVE mg/dL   Hgb urine dipstick NEGATIVE NEGATIVE   Bilirubin Urine NEGATIVE NEGATIVE   Ketones, ur NEGATIVE NEGATIVE mg/dL   Protein,  ur NEGATIVE NEGATIVE mg/dL   Nitrite NEGATIVE NEGATIVE   Leukocytes,Ua LARGE (A) NEGATIVE   RBC / HPF 0-5 0 - 5 RBC/hpf   WBC, UA >50 (H) 0 - 5 WBC/hpf   Bacteria, UA FEW (A) NONE SEEN   Squamous Epithelial / LPF >50 (H) 0 - 5   Mucus PRESENT    Amorphous Crystal PRESENT     Comment: Performed at [facility], [place] [address], [place], [place] [address]  CBC     Status: Abnormal   Collection Time: 03/26/19 10:56 AM  Result Value Ref Range   WBC 17.4 (H) 4.0 - 10.5 K/uL   RBC 4.64 3.87 - 5.11 MIL/uL   Hemoglobin 13.2 12.0 - 15.0 g/dL   HCT 42.6 36.0 - 46.0 %   MCV 91.8 80.0 - 100.0 fL    MCH 28.4 26.0 - 34.0 pg   MCHC 31.0 30.0 - 36.0 g/dL   RDW 14.8 11.5 - 15.5 %   Platelets 272 150 - 400 K/uL   nRBC 0.0 0.0 - 0.2 %    Comment: Performed at [facility], [place] [address] [place], [place] [address]  Comprehensive metabolic panel     Status: Abnormal   Collection Time: 03/26/19 10:56 AM  Result Value Ref Range   Sodium 139 135 - 145 mmol/L   Potassium 3.5 3.5 - 5.1 mmol/L   Chloride 105 98 - 111 mmol/L   CO2 26 22 - 32 mmol/L   Glucose, Bld 96 70 - 99 mg/dL   BUN 17 8 - 23 mg/dL   Creatinine, Ser 0.99 0.44 - 1.00 mg/dL   Calcium 9.4 8.9 - 10.3 mg/dL   Total Protein 6.9 6.5 - 8.1 g/dL   Albumin 3.5 3.5 - 5.0 g/dL   AST 20 15 - 41 U/L   ALT 25 0 - 44 U/L   Alkaline Phosphatase 113 38 - 126 U/L   Total Bilirubin 0.4 0.3 - 1.2 mg/dL   GFR calc non Af Amer 54 (L) >60 mL/min   GFR calc Af Amer >60 >60 mL/min   Anion gap 8 5 - 15    Comment: Performed at [facility] [address], [place], [place] [address]   Ct Head Wo Contrast  Result Date: 03/26/2019 CLINICAL DATA:  Facial trauma. EXAM: CT HEAD WITHOUT CONTRAST CT MAXILLOFACIAL WITHOUT CONTRAST CT CERVICAL SPINE WITHOUT CONTRAST TECHNIQUE: Multidetector CT imaging of the head, cervical spine, and maxillofacial structures were performed using the standard protocol without intravenous contrast. Multiplanar CT image reconstructions of the cervical spine and maxillofacial structures were also generated. COMPARISON:  None. FINDINGS: CT HEAD FINDINGS Brain: No evidence of acute infarction, hemorrhage, hydrocephalus, extra-axial collection or mass lesion/mass effect. Small old lacunar infarction is identified in the right basal ganglia. There is chronic diffuse atrophy. Chronic bilateral periventricular white matter small vessel ischemic changes noted. Vascular: No hyperdense vessel is noted. Skull: Normal. Negative for fracture or focal lesion. Other: A midline frontal scalp laceration with air and swelling of  soft tissue. CT MAXILLOFACIAL FINDINGS Osseous: No fracture or mandibular dislocation. No destructive process. Orbits: Negative. No traumatic or inflammatory finding. Sinuses: Mild mucoperiosteal thickening the right maxillary sinus is identified. Bilateral ostiomeatal complexes are patent. Soft tissues: Negative. CT CERVICAL SPINE FINDINGS Alignment: Normal. Skull base and vertebrae: No acute fracture. No primary bone lesion or focal pathologic process. Soft tissues and spinal canal: No prevertebral fluid or swelling. No visible canal hematoma. Disc levels: Degenerative joint  changes with narrowed joint space and osteophyte formation identified in the mid and in the lower cervical spine. Upper chest: Negative. Other: None. IMPRESSION: No focal acute intracranial abnormality identified. Midline frontal scalp laceration with air and swelling of the soft tissues. No acute fracture or dislocation of maxillofacial bones and cervical spine. Electronically Signed   By: Abelardo Diesel M.D.   On: 03/26/2019 11:35   Ct Cervical Spine Wo Contrast  Result Date: 03/26/2019 CLINICAL DATA:  Facial trauma. EXAM: CT HEAD WITHOUT CONTRAST CT MAXILLOFACIAL WITHOUT CONTRAST CT CERVICAL SPINE WITHOUT CONTRAST TECHNIQUE: Multidetector CT imaging of the head, cervical spine, and maxillofacial structures were performed using the standard protocol without intravenous contrast. Multiplanar CT image reconstructions of the cervical spine and maxillofacial structures were also generated. COMPARISON:  None. FINDINGS: CT HEAD FINDINGS Brain: No evidence of acute infarction, hemorrhage, hydrocephalus, extra-axial collection or mass lesion/mass effect. Small old lacunar infarction is identified in the right basal ganglia. There is chronic diffuse atrophy. Chronic bilateral periventricular white matter small vessel ischemic changes noted. Vascular: No hyperdense vessel is noted. Skull: Normal. Negative for fracture or focal lesion. Other: A  midline frontal scalp laceration with air and swelling of soft tissue. CT MAXILLOFACIAL FINDINGS Osseous: No fracture or mandibular dislocation. No destructive process. Orbits: Negative. No traumatic or inflammatory finding. Sinuses: Mild mucoperiosteal thickening the right maxillary sinus is identified. Bilateral ostiomeatal complexes are patent. Soft tissues: Negative. CT CERVICAL SPINE FINDINGS Alignment: Normal. Skull base and vertebrae: No acute fracture. No primary bone lesion or focal pathologic process. Soft tissues and spinal canal: No prevertebral fluid or swelling. No visible canal hematoma. Disc levels: Degenerative joint changes with narrowed joint space and osteophyte formation identified in the mid and in the lower cervical spine. Upper chest: Negative. Other: None. IMPRESSION: No focal acute intracranial abnormality identified. Midline frontal scalp laceration with air and swelling of the soft tissues. No acute fracture or dislocation of maxillofacial bones and cervical spine. Electronically Signed   By: Abelardo Diesel M.D.   On: 03/26/2019 11:35   Dg Chest Port 1 View  Result Date: 03/26/2019 CLINICAL DATA:  Fall.  Seizure. EXAM: PORTABLE CHEST 1 VIEW COMPARISON:  None. FINDINGS: The heart size and mediastinal contours are within normal limits. Both lungs are clear. The visualized skeletal structures are unremarkable. IMPRESSION: No active disease. Electronically Signed   By: Monte Fantasia M.D.   On: 03/26/2019 11:00   Ct Maxillofacial Wo Contrast  Result Date: 03/26/2019 CLINICAL DATA:  Facial trauma. EXAM: CT HEAD WITHOUT CONTRAST CT MAXILLOFACIAL WITHOUT CONTRAST CT CERVICAL SPINE WITHOUT CONTRAST TECHNIQUE: Multidetector CT imaging of the head, cervical spine, and maxillofacial structures were performed using the standard protocol without intravenous contrast. Multiplanar CT image reconstructions of the cervical spine and maxillofacial structures were also generated. COMPARISON:  None.  FINDINGS: CT HEAD FINDINGS Brain: No evidence of acute infarction, hemorrhage, hydrocephalus, extra-axial collection or mass lesion/mass effect. Small old lacunar infarction is identified in the right basal ganglia. There is chronic diffuse atrophy. Chronic bilateral periventricular white matter small vessel ischemic changes noted. Vascular: No hyperdense vessel is noted. Skull: Normal. Negative for fracture or focal lesion. Other: A midline frontal scalp laceration with air and swelling of soft tissue. CT MAXILLOFACIAL FINDINGS Osseous: No fracture or mandibular dislocation. No destructive process. Orbits: Negative. No traumatic or inflammatory finding. Sinuses: Mild mucoperiosteal thickening the right maxillary sinus is identified. Bilateral ostiomeatal complexes are patent. Soft tissues: Negative. CT CERVICAL SPINE FINDINGS Alignment: Normal. Skull base and vertebrae:  No acute fracture. No primary bone lesion or focal pathologic process. Soft tissues and spinal canal: No prevertebral fluid or swelling. No visible canal hematoma. Disc levels: Degenerative joint changes with narrowed joint space and osteophyte formation identified in the mid and in the lower cervical spine. Upper chest: Negative. Other: None. IMPRESSION: No focal acute intracranial abnormality identified. Midline frontal scalp laceration with air and swelling of the soft tissues. No acute fracture or dislocation of maxillofacial bones and cervical spine. Electronically Signed   By: Abelardo Diesel M.D.   On: 03/26/2019 11:35    Pending Labs Unresulted Labs (From admission, onward)    Start     Ordered   03/26/19 1353  Urine Culture  Once,   STAT     03/26/19 1353   03/26/19 1350  SARS Coronavirus 2 (CEPHEID - Performed in [place] hospital lab), Hosp Order  (Asymptomatic Patients Labs)  Once,   STAT    Question:  Rule Out  Answer:  Yes   03/26/19 1349          Vitals/Pain Today's Vitals   03/26/19 1259 03/26/19 1315 03/26/19 1330  03/26/19 1400  BP:  (!) 142/73 (!) 143/77 (!) 142/66  Pulse:  87 88 91  Resp:  15 16 14   Temp: 97.7 F (36.5 C)     TempSrc: Rectal     SpO2:  98% 97% 99%  Weight:      Height:      PainSc:        Isolation Precautions No active isolations  Medications Medications  zonisamide (ZONEGRAN) capsule 100 mg (100 mg Oral Not Given 03/26/19 1317)  lidocaine-EPINEPHrine (XYLOCAINE W/EPI) 2 %-1:200000 (PF) injection 10 mL (10 mLs Infiltration Given 03/26/19 1054)  levofloxacin (LEVAQUIN) IVPB 500 mg (0 mg Intravenous Stopped 03/26/19 1429)    Mobility walks with device High fall risk   Focused Assessments Neuro Assessment Handoff:  Swallow screen pass? Yes          Neuro Assessment: Exceptions to WDL Neuro Checks:      Last Documented NIHSS Modified Score:   Has TPA been given? No If patient is a Neuro Trauma and patient is going to OR before floor call report to [place] nurse: [phone] or [phone]     R Recommendations: See Admitting Provider Note  Report given to:   Additional Notes:

## 2019-03-26 NOTE — ED Notes (Signed)
3W is unable to take report on patient at this time. Reports the nurse who is taking the patient is coming from another unit and is not there at this time. Will call me when nurse has arrived.

## 2019-03-26 NOTE — ED Provider Notes (Signed)
LACERATION REPAIR Performed by: Alyse Low Authorized by: Alyse Low Consent: Verbal consent obtained. Risks and benefits: risks, benefits and alternatives were discussed Consent given by: patient Patient identity confirmed: provided demographic data Prepped and Draped in normal sterile fashion Wound explored  Laceration Location: forehead  Laceration Length: 2.5cm  No Foreign Bodies seen or palpated  Anesthesia: local infiltration  Local anesthetic: lidocaine 2%  Anesthetic total: 5 ml  Irrigation method: syringe Amount of cleaning: standard  Skin closure: simple  Number of sutures: 7  Technique: simple interrupted   Patient tolerance: Patient tolerated the procedure well with no immediate complications.   Katherine Meadow, PA-C 03/26/19 1440    Pattricia Boss, MD 03/27/19 [phone]

## 2019-03-26 NOTE — ED Triage Notes (Signed)
Pt here from independent living at [facility]. Pt thinks she had a seizure, she was "walking to the bathroom and the next thing she knew she was on the floor," and thinks that she lost consciousness. Pt has lac to forehead and knocked out two teeth. Hx seizures but has not had one in a while. Missed seizure med dose last night but took dose this morning. Neck stiffness and headache. A/o x 4 on arrival.

## 2019-03-26 NOTE — ED Notes (Signed)
Pt refusing dose of zonegran, reports that she took her missed dose of zonegran this morning and does not want to take another dose so close to the one she took this morning. Pt reports she is "extrememly sensitive to seizure medications."

## 2019-03-27 DIAGNOSIS — G40909 Epilepsy, unspecified, not intractable, without status epilepticus: Secondary | ICD-10-CM | POA: Diagnosis not present

## 2019-03-27 DIAGNOSIS — Z79899 Other long term (current) drug therapy: Secondary | ICD-10-CM | POA: Diagnosis not present

## 2019-03-27 DIAGNOSIS — Z743 Need for continuous supervision: Secondary | ICD-10-CM | POA: Diagnosis not present

## 2019-03-27 DIAGNOSIS — E785 Hyperlipidemia, unspecified: Secondary | ICD-10-CM | POA: Diagnosis not present

## 2019-03-27 DIAGNOSIS — S0181XA Laceration without foreign body of other part of head, initial encounter: Principal | ICD-10-CM

## 2019-03-27 DIAGNOSIS — R279 Unspecified lack of coordination: Secondary | ICD-10-CM | POA: Diagnosis not present

## 2019-03-27 DIAGNOSIS — R569 Unspecified convulsions: Secondary | ICD-10-CM | POA: Diagnosis not present

## 2019-03-27 DIAGNOSIS — I10 Essential (primary) hypertension: Secondary | ICD-10-CM | POA: Diagnosis not present

## 2019-03-27 DIAGNOSIS — Z03818 Encounter for observation for suspected exposure to other biological agents ruled out: Secondary | ICD-10-CM | POA: Diagnosis not present

## 2019-03-27 DIAGNOSIS — R55 Syncope and collapse: Secondary | ICD-10-CM | POA: Diagnosis not present

## 2019-03-27 DIAGNOSIS — W19XXXA Unspecified fall, initial encounter: Secondary | ICD-10-CM | POA: Diagnosis not present

## 2019-03-27 DIAGNOSIS — Z85828 Personal history of other malignant neoplasm of skin: Secondary | ICD-10-CM | POA: Diagnosis not present

## 2019-03-27 DIAGNOSIS — N390 Urinary tract infection, site not specified: Secondary | ICD-10-CM | POA: Diagnosis not present

## 2019-03-27 LAB — CBC
HCT: 39.8 % (ref 36.0–46.0)
Hemoglobin: 12.5 g/dL (ref 12.0–15.0)
MCH: 28.3 pg (ref 26.0–34.0)
MCHC: 31.4 g/dL (ref 30.0–36.0)
MCV: 90 fL (ref 80.0–100.0)
Platelets: 263 10*3/uL (ref 150–400)
RBC: 4.42 MIL/uL (ref 3.87–5.11)
RDW: 14.7 % (ref 11.5–15.5)
WBC: 14.1 10*3/uL — ABNORMAL HIGH (ref 4.0–10.5)
nRBC: 0 % (ref 0.0–0.2)

## 2019-03-27 LAB — BASIC METABOLIC PANEL
Anion gap: 10 (ref 5–15)
BUN: 14 mg/dL (ref 8–23)
CO2: 23 mmol/L (ref 22–32)
Calcium: 9.2 mg/dL (ref 8.9–10.3)
Chloride: 106 mmol/L (ref 98–111)
Creatinine, Ser: 0.87 mg/dL (ref 0.44–1.00)
GFR calc Af Amer: >60 mL/min (ref >60)
GFR calc non Af Amer: >60 mL/min (ref >60)
Glucose, Bld: 101 mg/dL — ABNORMAL HIGH (ref 70–99)
Potassium: 3.8 mmol/L (ref 3.5–5.1)
Sodium: 139 mmol/L (ref 135–145)

## 2019-03-27 LAB — URINE CULTURE

## 2019-03-27 MED ORDER — ATORVASTATIN CALCIUM 10 MG PO TABS: 20.0000 mg | ORAL_TABLET | Freq: Every day | ORAL | Status: DC

## 2019-03-27 NOTE — TOC Transition Note (Addendum)
Transition of Care [facility]) - CM/SW Discharge Note   Patient Details  Name: Katherine Obrien MRN: [id number] Date of Birth: 21-Apr-1941  [facility] CM/SW Contact:  Pollie Friar, RN Phone Number: 03/27/2019, 3:25 PM   Clinical Narrative:    Pt is discharging to the [facility] unit of [place] so she can get some therapy and supervision. Pt is in agreement. FL2 completed and faxed to the facility. [name] at [facility] verified bed availability. [facility] to transport patient. D/c packet at the desk. Bedside RN updated.   Room number: 926 Building E Number for report: [phone]   Final next level of care: Skilled Nursing Facility Barriers to Discharge: No Barriers Identified   Patient Goals and CMS Choice   CMS Medicare.gov Compare Post Acute Care list provided to:: Patient Choice offered to / list presented to : Patient  Discharge Placement   Existing PASRR number confirmed : 03/27/19          Patient chooses bed at: [facility] Patient to be transferred to facility by: [place] Name of family member notified: patient Patient and family notified of of transfer: 03/27/19  Discharge Plan and Services   Discharge Planning Services: CM Consult Post Acute Care Choice: Home Health                               Social Determinants of Health (SDOH) Interventions     Readmission Risk Interventions No flowsheet data found.

## 2019-03-27 NOTE — Plan of Care (Signed)

## 2019-03-27 NOTE — Discharge Summary (Signed)
Physician Discharge Summary  Katherine Obrien TGG:[id number] DOB: [birth year] DOA: 03/26/2019  PCP: Mast, Man X, NP  Admit date: 03/26/2019 Discharge date: 03/27/2019  Time spent: 45 minutes  Recommendations for Outpatient Follow-up:  1. Follow up with neurology 4-5 weeks for evaluation of symptoms.    Discharge Diagnoses:  Principal Problem:   Loss of consciousness ([place]) Active Problems:   Seizure disorder ([place])   Hyperlipidemia   UTI (urinary tract infection)   Essential hypertension   Discharge Condition: stable  Diet recommendation: heart healthy  Filed Weights   03/26/19 1054 03/27/19 0409  Weight: 83.9 kg 83.9 kg    History of present illness:  Katherine Obrien is a 78 y.o. female with medical history significant of seizures; HTN; and HLD presented 7/14 with LOC.   She reported that she fell overnight.  She was uncertain whether she fainted or had a seizure.  The last thing she rememberedwais brushing her teeth and feeling fine.  She was in a rush because she had an appointment at the clinic for derm reasons. She also reports increased stress as she had been working on her taxes all week.   The next thing she remembered was her cell phone ringing and she was on the floor.  She hit her forehead, face, and knocked teeth loose.  She was a little confused when she woke up but cleared quickly.   Hospital Course:  LOC/Seizure Patient had LOC at home. Possible seizure, as she does have seizure d/o and did not take Zonegran. No events on tele. CT head negative. No s/sx infection, no metabolic derangement. No focal defecits. Not orthostatic. No further seizure activity. Follow up with neuro in 2-3 weeks   Possible UTI-Patient with prior h/o UTI in April -She reports ongoing urinary hesitation since without other UTI symptoms She was given 1 dose of Levaquin in the ER. Urine culture with multiple organisms non predominant.     HLD -Continue  Lipitor  Procedures: Consultations:    Discharge Exam: Vitals:   03/27/19 0902 03/27/19 1144  BP: 122/68 134/62  Pulse: 85 76  Resp: 20 16  Temp: 98.7 F (37.1 C) 98 F (36.7 C)  SpO2: 98% 98%    General: awake alert laceration forehead clean and dry. Katherine Obrien eyes from fall Cardiovascular: rrr no mgr no LE edema Respiratory: normal effort BS clear bilaterally no wheeze  Discharge Instructions   Discharge Instructions    Diet - low sodium heart healthy   Complete by: As directed    Discharge instructions   Complete by: As directed    Take medications as prescribed Follow up with neurology 4-5 weeks   Increase activity slowly   Complete by: As directed      Allergies as of 03/27/2019      Reactions   Barbiturates Other (See Comments)   A small amount "overdosed" the patient- "affected my stability and balance" (might have been given at the same as another med?)   Cefdinir Other (See Comments)   A small amount "overdosed" the patient- "affected my stability and balance" (might have been given at the same as another med?)   Phenobarbital Other (See Comments)   A small amount "overdosed" the patient- "affected my stability and balance" (might have been given at the same as another med?)   Tegretol [carbamazepine] Other (See Comments)   A small amount "overdosed" the patient- "affected my stability and balance" (might have been given at the same as another med?)  Medication List    TAKE these medications   atorvastatin 20 MG tablet Commonly known as: LIPITOR Take 1 tablet (20 mg total) by mouth daily. What changed: when to take this   omeprazole 20 MG capsule Commonly known as: PRILOSEC Take 20 mg by mouth daily.   One-A-Day Womens 50 Plus Tabs Take 1 tablet by mouth daily with breakfast.   PreserVision/Lutein Caps Take 1 capsule by mouth daily with breakfast.   PreviDent 5000 Booster Plus 1.1 % Pste Generic drug: Sodium Fluoride Place 1 application  onto teeth every evening.   Systane 0.4-0.3 % Soln Generic drug: Polyethyl Glycol-Propyl Glycol Place 1-2 drops into both eyes 3 (three) times daily as needed (for dryness).   Vitamin D-3 25 MCG (1000 UT) Caps Take 1,000 Units by mouth daily with breakfast.   zonisamide 100 MG capsule Commonly known as: ZONEGRAN Take 100 mg by mouth at bedtime.      Allergies  Allergen Reactions  . Barbiturates Other (See Comments)    A small amount "overdosed" the patient- "affected my stability and balance" (might have been given at the same as another med?)  . Cefdinir Other (See Comments)    A small amount "overdosed" the patient- "affected my stability and balance" (might have been given at the same as another med?)  . Phenobarbital Other (See Comments)    A small amount "overdosed" the patient- "affected my stability and balance" (might have been given at the same as another med?)  . Tegretol [Carbamazepine] Other (See Comments)    A small amount "overdosed" the patient- "affected my stability and balance" (might have been given at the same as another med?)      The results of significant diagnostics from this hospitalization (including imaging, microbiology, ancillary and laboratory) are listed below for reference.    Significant Diagnostic Studies: Ct Head Wo Contrast  Result Date: 03/26/2019 CLINICAL DATA:  Facial trauma. EXAM: CT HEAD WITHOUT CONTRAST CT MAXILLOFACIAL WITHOUT CONTRAST CT CERVICAL SPINE WITHOUT CONTRAST TECHNIQUE: Multidetector CT imaging of the head, cervical spine, and maxillofacial structures were performed using the standard protocol without intravenous contrast. Multiplanar CT image reconstructions of the cervical spine and maxillofacial structures were also generated. COMPARISON:  None. FINDINGS: CT HEAD FINDINGS Brain: No evidence of acute infarction, hemorrhage, hydrocephalus, extra-axial collection or mass lesion/mass effect. Small old lacunar infarction is  identified in the right basal ganglia. There is chronic diffuse atrophy. Chronic bilateral periventricular white matter small vessel ischemic changes noted. Vascular: No hyperdense vessel is noted. Skull: Normal. Negative for fracture or focal lesion. Other: A midline frontal scalp laceration with air and swelling of soft tissue. CT MAXILLOFACIAL FINDINGS Osseous: No fracture or mandibular dislocation. No destructive process. Orbits: Negative. No traumatic or inflammatory finding. Sinuses: Mild mucoperiosteal thickening the right maxillary sinus is identified. Bilateral ostiomeatal complexes are patent. Soft tissues: Negative. CT CERVICAL SPINE FINDINGS Alignment: Normal. Skull base and vertebrae: No acute fracture. No primary bone lesion or focal pathologic process. Soft tissues and spinal canal: No prevertebral fluid or swelling. No visible canal hematoma. Disc levels: Degenerative joint changes with narrowed joint space and osteophyte formation identified in the mid and in the lower cervical spine. Upper chest: Negative. Other: None. IMPRESSION: No focal acute intracranial abnormality identified. Midline frontal scalp laceration with air and swelling of the soft tissues. No acute fracture or dislocation of maxillofacial bones and cervical spine. Electronically Signed   By: Abelardo Diesel M.D.   On: 03/26/2019 11:35   Ct Cervical  Spine Wo Contrast  Result Date: 03/26/2019 CLINICAL DATA:  Facial trauma. EXAM: CT HEAD WITHOUT CONTRAST CT MAXILLOFACIAL WITHOUT CONTRAST CT CERVICAL SPINE WITHOUT CONTRAST TECHNIQUE: Multidetector CT imaging of the head, cervical spine, and maxillofacial structures were performed using the standard protocol without intravenous contrast. Multiplanar CT image reconstructions of the cervical spine and maxillofacial structures were also generated. COMPARISON:  None. FINDINGS: CT HEAD FINDINGS Brain: No evidence of acute infarction, hemorrhage, hydrocephalus, extra-axial collection or  mass lesion/mass effect. Small old lacunar infarction is identified in the right basal ganglia. There is chronic diffuse atrophy. Chronic bilateral periventricular white matter small vessel ischemic changes noted. Vascular: No hyperdense vessel is noted. Skull: Normal. Negative for fracture or focal lesion. Other: A midline frontal scalp laceration with air and swelling of soft tissue. CT MAXILLOFACIAL FINDINGS Osseous: No fracture or mandibular dislocation. No destructive process. Orbits: Negative. No traumatic or inflammatory finding. Sinuses: Mild mucoperiosteal thickening the right maxillary sinus is identified. Bilateral ostiomeatal complexes are patent. Soft tissues: Negative. CT CERVICAL SPINE FINDINGS Alignment: Normal. Skull base and vertebrae: No acute fracture. No primary bone lesion or focal pathologic process. Soft tissues and spinal canal: No prevertebral fluid or swelling. No visible canal hematoma. Disc levels: Degenerative joint changes with narrowed joint space and osteophyte formation identified in the mid and in the lower cervical spine. Upper chest: Negative. Other: None. IMPRESSION: No focal acute intracranial abnormality identified. Midline frontal scalp laceration with air and swelling of the soft tissues. No acute fracture or dislocation of maxillofacial bones and cervical spine. Electronically Signed   By: Abelardo Diesel M.D.   On: 03/26/2019 11:35   Dg Chest Port 1 View  Result Date: 03/26/2019 CLINICAL DATA:  Fall.  Seizure. EXAM: PORTABLE CHEST 1 VIEW COMPARISON:  None. FINDINGS: The heart size and mediastinal contours are within normal limits. Both lungs are clear. The visualized skeletal structures are unremarkable. IMPRESSION: No active disease. Electronically Signed   By: Monte Fantasia M.D.   On: 03/26/2019 11:00   Ct Maxillofacial Wo Contrast  Result Date: 03/26/2019 CLINICAL DATA:  Facial trauma. EXAM: CT HEAD WITHOUT CONTRAST CT MAXILLOFACIAL WITHOUT CONTRAST CT CERVICAL  SPINE WITHOUT CONTRAST TECHNIQUE: Multidetector CT imaging of the head, cervical spine, and maxillofacial structures were performed using the standard protocol without intravenous contrast. Multiplanar CT image reconstructions of the cervical spine and maxillofacial structures were also generated. COMPARISON:  None. FINDINGS: CT HEAD FINDINGS Brain: No evidence of acute infarction, hemorrhage, hydrocephalus, extra-axial collection or mass lesion/mass effect. Small old lacunar infarction is identified in the right basal ganglia. There is chronic diffuse atrophy. Chronic bilateral periventricular white matter small vessel ischemic changes noted. Vascular: No hyperdense vessel is noted. Skull: Normal. Negative for fracture or focal lesion. Other: A midline frontal scalp laceration with air and swelling of soft tissue. CT MAXILLOFACIAL FINDINGS Osseous: No fracture or mandibular dislocation. No destructive process. Orbits: Negative. No traumatic or inflammatory finding. Sinuses: Mild mucoperiosteal thickening the right maxillary sinus is identified. Bilateral ostiomeatal complexes are patent. Soft tissues: Negative. CT CERVICAL SPINE FINDINGS Alignment: Normal. Skull base and vertebrae: No acute fracture. No primary bone lesion or focal pathologic process. Soft tissues and spinal canal: No prevertebral fluid or swelling. No visible canal hematoma. Disc levels: Degenerative joint changes with narrowed joint space and osteophyte formation identified in the mid and in the lower cervical spine. Upper chest: Negative. Other: None. IMPRESSION: No focal acute intracranial abnormality identified. Midline frontal scalp laceration with air and swelling of the soft tissues.  No acute fracture or dislocation of maxillofacial bones and cervical spine. Electronically Signed   By: Abelardo Diesel M.D.   On: 03/26/2019 11:35    Microbiology: Recent Results (from the past 240 hour(s))  SARS Coronavirus 2 (CEPHEID - Performed in [place] hospital lab), Hosp Order     Status: None   Collection Time: 03/26/19  1:50 PM   Specimen: Nasopharyngeal Swab  Result Value Ref Range Status   SARS Coronavirus 2 NEGATIVE NEGATIVE Final    Comment: (NOTE) If result is NEGATIVE SARS-CoV-2 target nucleic acids are NOT DETECTED. The SARS-CoV-2 RNA is generally detectable in upper and lower  respiratory specimens during the acute phase of infection. The lowest  concentration of SARS-CoV-2 viral copies this assay can detect is 250  copies / mL. A negative result does not preclude SARS-CoV-2 infection  and should not be used as the sole basis for treatment or other  patient management decisions.  A negative result may occur with  improper specimen collection / handling, submission of specimen other  than nasopharyngeal swab, presence of viral mutation(s) within the  areas targeted by this assay, and inadequate number of viral copies  (<250 copies / mL). A negative result must be combined with clinical  observations, patient history, and epidemiological information. If result is POSITIVE SARS-CoV-2 target nucleic acids are DETECTED. The SARS-CoV-2 RNA is generally detectable in upper and lower  respiratory specimens dur ing the acute phase of infection.  Positive  results are indicative of active infection with SARS-CoV-2.  Clinical  correlation with patient history and other diagnostic information is  necessary to determine patient infection status.  Positive results do  not rule out bacterial infection or co-infection with other viruses. If result is PRESUMPTIVE POSTIVE SARS-CoV-2 nucleic acids MAY BE PRESENT.   A presumptive positive result was obtained on the submitted specimen  and confirmed on repeat testing.  While 2019 novel coronavirus  (SARS-CoV-2) nucleic acids may be present in the submitted sample  additional confirmatory testing may be necessary for epidemiological  and / or clinical management purposes  to  differentiate between  SARS-CoV-2 and other Sarbecovirus currently known to infect humans.  If clinically indicated additional testing with an alternate test  methodology [phone]) is advised. The SARS-CoV-2 RNA is generally  detectable in upper and lower respiratory sp ecimens during the acute  phase of infection. The expected result is Negative. Fact Sheet for Patients:  StrictlyIdeas.no Fact Sheet for Healthcare Providers: BankingDealers.co.za This test is not yet approved or cleared by the [place] FDA and has been authorized for detection and/or diagnosis of SARS-CoV-2 by FDA under an Emergency Use Authorization (EUA).  This EUA will remain in effect (meaning this test can be used) for the duration of the COVID-19 declaration under Section 564(b)(1) of the Act, 21 U.S.C. section 360bbb-3(b)(1), unless the authorization is terminated or revoked sooner. Performed at [facility], [place] [address], [place], [place] [address]   Urine Culture     Status: Abnormal   Collection Time: 03/26/19  1:53 PM   Specimen: Urine, Clean Catch  Result Value Ref Range Status   Specimen Description URINE, CLEAN CATCH  Final   Special Requests   Final    NONE Performed at [facility], [place] [address] [place], [place] [address]    Culture MULTIPLE SPECIES PRESENT, SUGGEST RECOLLECTION (A)  Final   Report Status 03/27/2019 FINAL  Final     Labs: Basic Metabolic Panel: Recent  Labs  Lab 03/26/19 1056 03/27/19 0422  NA 139 139  K 3.5 3.8  CL 105 106  CO2 26 23  GLUCOSE 96 101*  BUN 17 14  CREATININE 0.99 0.87  CALCIUM 9.4 9.2   Liver Function Tests: Recent Labs  Lab 03/26/19 1056  AST 20  ALT 25  ALKPHOS 113  BILITOT 0.4  PROT 6.9  ALBUMIN 3.5   No results for input(s): LIPASE, AMYLASE in the last 168 hours. No results for input(s): AMMONIA in the last 168 hours. CBC: Recent Labs  Lab 03/26/19 1056 03/27/19 0422   WBC 17.4* 14.1*  HGB 13.2 12.5  HCT 42.6 39.8  MCV 91.8 90.0  PLT 272 263   Cardiac Enzymes: No results for input(s): CKTOTAL, CKMB, CKMBINDEX, TROPONINI in the last 168 hours. BNP: BNP (last 3 results) No results for input(s): BNP in the last 8760 hours.  ProBNP (last 3 results) No results for input(s): PROBNP in the last 8760 hours.  CBG: No results for input(s): GLUCAP in the last 168 hours.     SignedRadene Gunning NP [facility] 03/27/2019, 12:14 PM

## 2019-03-27 NOTE — NC FL2 (Signed)
[place] LEVEL OF CARE SCREENING TOOL     IDENTIFICATION  Patient Name: Katherine Obrien Birthdate: 05-02-1941 Sex: female Admission Date (Current Location): 03/26/2019  [facility] [place] and [place] Number:  Herbalist and Address:  [facility], [place] [address] [place], [place] [address]      Provider Number: [id number]  Attending Physician Name and Address:  Geradine Girt, DO  Relative Name and Phone Number:       Current Level of Care: Hospital Recommended Level of Care: [place] Prior Approval Number:    Date Approved/Denied:   PASRR Number: [id number]  Discharge Plan: SNF    Current Diagnoses: Patient Active Problem List   Diagnosis Date Noted  . Loss of consciousness ([place]) 03/26/2019  . Essential hypertension 03/26/2019  . Actinic keratoses 12/20/2018  . Elevated systolic blood pressure reading without diagnosis of hypertension 11/16/2018  . UTI (urinary tract infection) 10/08/2018  . Adynamic ileus ([place]) 10/08/2018  . Diarrhea 10/05/2018  . GERD (gastroesophageal reflux disease) 10/05/2018  . Urinary hesitancy 10/05/2018  . Advanced care planning/counseling discussion 10/05/2018  . Hyperlipidemia 09/19/2018  . Gait abnormality 09/17/2018  . Macular degeneration 09/17/2018  . Dry eyes 09/17/2018  . History of diverticulitis 09/17/2018  . Seizure disorder ([place]) 09/17/2018  . Hx of vertebral fracture repair 09/17/2018  . Obese 09/17/2018    Orientation RESPIRATION BLADDER Height & Weight     Self, Time, Situation, Place  Normal Continent Weight: 83.9 kg Height:  5\' 3"  (160 cm)  BEHAVIORAL SYMPTOMS/MOOD NEUROLOGICAL BOWEL NUTRITION STATUS      Continent Diet(regular with thin liquids)  AMBULATORY STATUS COMMUNICATION OF NEEDS Skin   Limited Assist Verbally Bruising(ecchymosis to face/ stitches to forehead)                       Personal Care Assistance Level of Assistance  Bathing,  Feeding Bathing Assistance: Limited assistance Feeding assistance: Independent       Functional Limitations Info  Sight, Hearing, Speech Sight Info: Impaired Hearing Info: Adequate Speech Info: Adequate    SPECIAL CARE FACTORS FREQUENCY  PT (By licensed PT), OT (By licensed OT)     PT Frequency: 5x/wk OT Frequency: 5x/wk            Contractures Contractures Info: Not present    Additional Factors Info  Code Status, Allergies, Psychotropic Code Status Info: Full Allergies Info: Barbituates/ cefdinir/ Phenobarbital/ Tegretol. Psychotropic Info: Zonegran 100mg  at night         Current Medications (03/27/2019):  This is the current hospital active medication list Current Facility-Administered Medications  Medication Dose Route Frequency Provider Last Rate Last Dose  . acetaminophen (TYLENOL) tablet 650 mg  650 mg Oral Q6H PRN Karmen Bongo, MD       Or  . acetaminophen (TYLENOL) suppository 650 mg  650 mg Rectal Q6H PRN Karmen Bongo, MD      . atorvastatin (LIPITOR) tablet 20 mg  20 mg Oral q1800 Vann, Jessica U, DO      . enoxaparin (LOVENOX) injection 40 mg  40 mg Subcutaneous Q24H Karmen Bongo, MD   40 mg at 03/26/19 2225  . LORazepam (ATIVAN) injection 1-2 mg  1-2 mg Intravenous Q2H PRN Karmen Bongo, MD      . ondansetron [facility]) tablet 4 mg  4 mg Oral Q6H PRN Karmen Bongo, MD       Or  . ondansetron [facility]) injection 4 mg  4 mg Intravenous Q6H PRN Karmen Bongo, MD      . sodium chloride flush (NS) 0.9 % injection 3 mL  3 mL Intravenous Q12H Karmen Bongo, MD   3 mL at 03/27/19 0933  . zonisamide (ZONEGRAN) capsule 100 mg  100 mg Oral QHS Karmen Bongo, MD   100 mg at 03/26/19 2257     Discharge Medications: Please see discharge summary for a list of discharge medications.  Relevant Imaging Results:  Relevant Lab Results:   Additional Information SS#; [id number]  Pollie Friar, RN

## 2019-03-27 NOTE — TOC Initial Note (Signed)
Transition of Care [facility]) - Initial/Assessment Note    Patient Details  Name: Katherine Obrien MRN: [id number] Date of Birth: June 07, 1941  Transition of [facility]) CM/SW Contact:    Pollie Friar, RN Phone Number: 03/27/2019, 11:28 AM  Clinical Narrative:                 [place] is from [place] [facility]. She plans to return to her apartment.  Pt denies issues with her medications at home and has transportation provided through [facility]  TOC following for d/c needs.  Expected Discharge Plan: [place] Barriers to Discharge: Continued Medical Work up   Patient Goals and CMS Choice     Choice offered to / list presented to : Patient  Expected Discharge Plan and Services Expected Discharge Plan: [place]   Discharge Planning Services: CM Consult Post Acute Care Choice: [place] arrangements for the past 2 months: Apartment                                      Prior Living Arrangements/Services Living arrangements for the past 2 months: Apartment Lives with:: Self Patient language and need for interpreter reviewed:: Yes(no needs) Do you feel safe going back to the place where you live?: Yes      Need for Family Participation in Patient Care: Yes (Comment)(intermittent supervision)   Current home services: DME(walker, shower chair, toilet with handles) Criminal Activity/Legal Involvement Pertinent to Current Situation/Hospitalization: No - Comment as needed  Activities of Daily Living      Permission Sought/Granted                  Emotional Assessment Appearance:: Appears stated age Attitude/Demeanor/Rapport: Engaged Affect (typically observed): Accepting, Pleasant Orientation: : Oriented to Self, Oriented to Place, Oriented to  Time, Oriented to Situation   Psych Involvement: No (comment)  Admission diagnosis:  Facial laceration, initial encounter [S01.81XA] Fall, initial encounter  [W19.XXXA] Urinary tract infection without hematuria, site unspecified [N39.0] Patient Active Problem List   Diagnosis Date Noted  . Loss of consciousness ([place]) 03/26/2019  . Essential hypertension 03/26/2019  . Actinic keratoses 12/20/2018  . Elevated systolic blood pressure reading without diagnosis of hypertension 11/16/2018  . UTI (urinary tract infection) 10/08/2018  . Adynamic ileus ([place]) 10/08/2018  . Diarrhea 10/05/2018  . GERD (gastroesophageal reflux disease) 10/05/2018  . Urinary hesitancy 10/05/2018  . Advanced care planning/counseling discussion 10/05/2018  . Hyperlipidemia 09/19/2018  . Gait abnormality 09/17/2018  . Macular degeneration 09/17/2018  . Dry eyes 09/17/2018  . History of diverticulitis 09/17/2018  . Seizure disorder ([place]) 09/17/2018  . Hx of vertebral fracture repair 09/17/2018  . Obese 09/17/2018   PCP:  Mast, Man X, NP Pharmacy:   [facility], [place] [address] Phone: [phone] Fax: [phone]     Social Determinants of Health (SDOH) Interventions    Readmission Risk Interventions No flowsheet data found.

## 2019-03-27 NOTE — Discharge Instructions (Signed)
Per [place] DMV statutes, patients with seizures are not allowed to drive until they have been seizure-free for six months.    Use caution when using heavy equipment or power tools. Avoid working on ladders or at heights. Take showers instead of baths. Ensure the water temperature is not too high on the home water heater. Do not go swimming alone. Do not lock yourself in a room alone (i.e. bathroom). When caring for infants or small children, sit down when holding, feeding, or changing them to minimize risk of injury to the child in the event you have a seizure. Maintain good sleep hygiene. Avoid alcohol.    If patient has another seizure, call 911 and bring them back to the ED if: A.  The seizure lasts longer than 5 minutes.      B.  The patient doesn't wake shortly after the seizure or has new problems such as difficulty seeing, speaking or moving following the seizure C.  The patient was injured during the seizure D.  The patient has a temperature over 102 F (39C) E.  The patient vomited during the seizure and now is having trouble breathing  

## 2019-03-27 NOTE — Evaluation (Signed)
Physical Therapy Evaluation Patient Details Name: Chris Narasimhan MRN: [id number] DOB: 16-Apr-1941 Today's Date: 03/27/2019   History of Present Illness  Patient is a 78 y/o female presenting to the ED on 03/26/2019 s/p LOC with fall. CTs negative. Past medical history significant of seizures; HTN; and HLD.    Clinical Impression  Patient admitted with the above listed diagnosis. Patient reports Mod I with mobility and ADLs prior to admission. Patient today requiring general min guard for all functional mobility with use of RW. Patient ambulating short distance in room with min guard with cueing for safety and obstacle navigation. Discussion with patient regarding discharge recommendations with both PT and patient in agreement of HHPT to ensure safety, balance, improved strength and overall functional mobility in the home environment. PT to follow acutely.      Follow Up Recommendations Home health PT;Supervision - Intermittent    Equipment Recommendations  None recommended by PT    Recommendations for Other Services       Precautions / Restrictions Precautions Precautions: Fall Restrictions Weight Bearing Restrictions: No      Mobility  Bed Mobility Overal bed mobility: Modified Independent             General bed mobility comments: to come to EOB  Transfers Overall transfer level: Needs assistance Equipment used: Rolling walker (2 wheeled) Transfers: Sit to/from Stand Sit to Stand: Min guard         General transfer comment: min guard for safety and immediate standing balance  Ambulation/Gait Ambulation/Gait assistance: Min guard Gait Distance (Feet): 40 Feet Assistive device: Rolling walker (2 wheeled) Gait Pattern/deviations: Step-through pattern;Decreased stride length;Trunk flexed Gait velocity: decreased   General Gait Details: short step length with mild instability; cueing for safety and overall stability  Stairs            Wheelchair  Mobility    Modified Rankin (Stroke Patients Only)       Balance Overall balance assessment: Mild deficits observed, not formally tested                                           Pertinent Vitals/Pain Pain Assessment: Faces Faces Pain Scale: Hurts a little bit Pain Location: front teeth Pain Descriptors / Indicators: Aching;Discomfort Pain Intervention(s): Limited activity within patient's tolerance;Monitored during session    Home Living Family/patient expects to be discharged to:: Assisted living               Home Equipment: Walker - 2 wheels;Shower seat;Toilet riser Additional Comments: ILF    Prior Function Level of Independence: Independent with assistive device(s)               Hand Dominance        Extremity/Trunk Assessment   Upper Extremity Assessment Upper Extremity Assessment: Defer to OT evaluation    Lower Extremity Assessment Lower Extremity Assessment: Generalized weakness    Cervical / Trunk Assessment Cervical / Trunk Assessment: Kyphotic  Communication   Communication: No difficulties  Cognition Arousal/Alertness: Awake/alert Behavior During Therapy: WFL for tasks assessed/performed Overall Cognitive Status: Within Functional Limits for tasks assessed                                        General Comments General comments (skin integrity, edema, etc.): laceration  at head, front teeth missing    Exercises     Assessment/Plan    PT Assessment Patient needs continued PT services  PT Problem List Decreased strength;Decreased activity tolerance;Decreased balance;Decreased mobility;Decreased knowledge of use of DME;Decreased safety awareness;Decreased knowledge of precautions       PT Treatment Interventions DME instruction;Gait training;Functional mobility training;Therapeutic activities;Therapeutic exercise;Balance training;Patient/family education    PT Goals (Current goals can be found  in the Care Plan section)  Acute Rehab PT Goals Patient Stated Goal: improve strength PT Goal Formulation: With patient Time For Goal Achievement: 04/10/19 Potential to Achieve Goals: Good    Frequency Min 3X/week   Barriers to discharge        Co-evaluation               AM-PAC PT "6 Clicks" Mobility  Outcome Measure Help needed turning from your back to your side while in a flat bed without using bedrails?: None Help needed moving from lying on your back to sitting on the side of a flat bed without using bedrails?: None Help needed moving to and from a bed to a chair (including a wheelchair)?: A Little Help needed standing up from a chair using your arms (e.g., wheelchair or bedside chair)?: A Little Help needed to walk in hospital room?: A Little Help needed climbing 3-5 steps with a railing? : A Lot 6 Click Score: 19    End of Session Equipment Utilized During Treatment: Gait belt Activity Tolerance: Patient tolerated treatment well Patient left: in chair;with call bell/phone within reach;with chair alarm set Nurse Communication: Mobility status PT Visit Diagnosis: Unsteadiness on feet (R26.81);Other abnormalities of gait and mobility (R26.89);Muscle weakness (generalized) (M62.81);History of falling (Z91.81)    Time: 0045-9977 PT Time Calculation (min) (ACUTE ONLY): 35 min   Charges:   PT Evaluation $PT Eval Moderate Complexity: 1 Mod PT Treatments $Gait Training: 8-22 mins        Lanney Gins, PT, DPT Supplemental Physical Therapist 03/27/19 11:20 AM Pager: [phone] Office: [phone]

## 2019-03-27 NOTE — Plan of Care (Signed)
Patient stable, discussed POC with patient, agreeable with plan, denies question/concerns at this time.  

## 2019-03-27 NOTE — Care Management Obs Status (Signed)
[facility] NOTIFICATION   Patient Details  Name: Katherine Obrien MRN: [id number] Date of Birth: 1941-07-25   Medicare Observation Status Notification Given:  Yes    Pollie Friar, RN 03/27/2019, 12:33 PM

## 2019-03-28 ENCOUNTER — Encounter: Payer: Self-pay | Admitting: Internal Medicine

## 2019-03-28 ENCOUNTER — Non-Acute Institutional Stay (SKILLED_NURSING_FACILITY): Payer: Medicare Other | Admitting: Internal Medicine

## 2019-03-28 DIAGNOSIS — E785 Hyperlipidemia, unspecified: Secondary | ICD-10-CM

## 2019-03-28 DIAGNOSIS — R4020 Unspecified coma: Secondary | ICD-10-CM

## 2019-03-28 DIAGNOSIS — N390 Urinary tract infection, site not specified: Secondary | ICD-10-CM | POA: Diagnosis not present

## 2019-03-28 DIAGNOSIS — G40909 Epilepsy, unspecified, not intractable, without status epilepticus: Secondary | ICD-10-CM

## 2019-03-28 NOTE — Progress Notes (Signed)
Provider:  Dr. Veleta Miners  Location:  [facility] Room Number: [phone] Place of Service:  SNF (31)  PCP: Mast, Man X, NP Patient Care Team: Mast, Man X, NP as PCP - General (Internal Medicine) Rosita Fire, MD as Referring Physician (Neurology) Dorothe Pea, Jeri Lager, MD as Referring Physician (Dermatology)  Extended Emergency Contact Information Primary Emergency Contact: Sigler,James Address: [place] [address] [place] Phone: [phone] Relation: Son Secondary Emergency Contact: Whisenant,Caroline Address: [place] [address] [place] Phone: [phone] Work Phone: [phone] Mobile Phone: [phone] Relation: Daughter  Code Status: FULL Goals of Care: Advanced Directive information Advanced Directives 03/28/2019  Does Patient Have a Medical Advance Directive? Yes  Type of Advance Directive [place]  Does patient want to make changes to medical advance directive? No - Patient declined  Copy of [place] in Chart? Yes - validated most recent copy scanned in chart (See row information)  Pre-existing out of facility DNR order (yellow form or pink MOST form) -      Chief Complaint  Patient presents with   New Admit To SNF    Admit to Facility     HPI: Patient is a 78 y.o. female seen today for admission to admit to SNF for therapy Patient was admitted in the hospital for 24 hours has been having LOC/seizure  Patient has a history of seizure disorder since she was in her 35s.  She is on Zonegran.  She missed her dose 1 day and next day she was running around because she had appointment with her dentist and her dermatologist.  She was in her room brushing when she turned around and the next thing she knows she passed out and  was on the floor and she heard her cell phone ringing.  She had abrasions on her face  and she has knocked out 2 teeth In the hospital patient did not have any seizure activity CT of the head was negative.  She did have leukocytosis and received 1 dose of Levaquin.  But her culture was negative.  Patient was discharged after 24 hours.  She is now in SNF for observation and therapy. She did not have any acute complaints.  She denies any dysuria fever chills shortness of breath or cough. Patient has recently moved into a friend's home [place] apartment.  She is very active and drives.  Walks with a walker.  Has 2 children who live out of state.  Past Medical History:  Diagnosis Date   Arthritis    Cancer [facility])    skin   Cataract 2010   Dr. Alexander Mt, Dr.. Jerlyn Ly @ [facility]   Epilepsy [facility]) 08/11/2015   Hyperlipidemia 12/08/2015   Hypertension    Polyp of colon 08/11/2015   Past Surgical History:  Procedure Laterality Date   [place]   COLON SURGERY  09/12/2006   FEMUR SURGERY  2018   Broken   TONSILLECTOMY  09/12/1944    reports that she has never smoked. She has never used smokeless tobacco. She reports that she does not drink alcohol or use drugs. Social History   Socioeconomic History   Marital status: Widowed    Spouse name: Not on file   Number of  children: Not on file   Years of education: Not on file   Highest education level: Not on file  Occupational History   Occupation: Electrical engineer    Comment: retired  Scientist, product/process development strain: Not on file   Food insecurity    Worry: Not on file    Inability: Not on Lexicographer needs    Medical: Not on file    Non-medical: Not on file  Tobacco Use   Smoking status: Never Smoker   Smokeless tobacco: Never Used  Substance and Sexual Activity   Alcohol use: Never    Frequency: Never   Drug use: Never   Sexual activity: Not on file  Lifestyle   Physical activity    Days per week: Not on file    Minutes per  session: Not on file   Stress: Not on file  Relationships   Social connections    Talks on phone: Not on file    Gets together: Not on file    Attends religious service: Not on file    Active member of club or organization: Not on file    Attends meetings of clubs or organizations: Not on file    Relationship status: Not on file   Intimate partner violence    Fear of current or ex partner: Not on file    Emotionally abused: Not on file    Physically abused: Not on file    Forced sexual activity: Not on file  Other Topics Concern   Not on file  Social History Narrative   Social History     Socioeconomic History       Marital status: Widowed       Number of children:2       Years of education:  AB in Art [phone])       Highest education level: Bachelors in      Occupational History : Engineer, 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       Food insecurity:         Worry: Not on file         Inability: Not on file       Transportation needs:         Medical: Not on file         Non-medical: Not on file     Tobacco Use       Smoking status: Not on file     Substance and Sexual Activity       Alcohol use: Not on file       Drug use: Not on file       Sexual activity: Not on file     Lifestyle       Physical activity:         Days per week: Not on file         Minutes per session: A little       Stress: Not on file     Relationships       Social connections:         Talks on phone: Not on file         Gets together: Not on file         Attends religious service: Not on file         Active member of club or organization:  Not on file         Attends meetings of clubs or organizations: Not on file         Relationship status: Not on file       Intimate partner violence:         Fear of current or ex partner: Not on file         Emotionally abused: Not on file         Physically abused: Not on file         Forced sexual  activity: Not on file     Other Topics       Concerns:         Not on file     Social History Narrative       Not on file       Functional Status Survey:    Family History  Problem Relation Age of Onset   Osteoporosis Mother    Dementia Mother    Arthritis Mother    Stroke Father 56       mini   Dementia Father    CVA Father    Cancer Sister        breast   Dementia Sister        severe   Alzheimer's disease Sister    Diabetes Sister    Hypertension Sister    Stroke Paternal [place] Maintenance  Topic Date Due   INFLUENZA VACCINE  04/13/2019   TETANUS/TDAP  12/21/2025   DEXA SCAN  Completed   PNA vac Low Risk Adult  Completed    Allergies  Allergen Reactions   Barbiturates Other (See Comments)    A small amount "overdosed" the patient- "affected my stability and balance" (might have been given at the same as another med?)   Cefdinir Other (See Comments)    A small amount "overdosed" the patient- "affected my stability and balance" (might have been given at the same as another med?)   Phenobarbital Other (See Comments)    A small amount "overdosed" the patient- "affected my stability and balance" (might have been given at the same as another med?)   Tegretol [Carbamazepine] Other (See Comments)    A small amount "overdosed" the patient- "affected my stability and balance" (might have been given at the same as another med?)    Outpatient Encounter Medications as of 03/28/2019  Medication Sig   atorvastatin (LIPITOR) 20 MG tablet Take 1 tablet (20 mg total) by mouth daily.   Cholecalciferol (VITAMIN D-3) 25 MCG (1000 UT) CAPS Take 1,000 Units by mouth daily with breakfast.   Multiple Vitamins-Minerals (ONE-A-DAY WOMENS 50 PLUS) TABS Take 1 tablet by mouth daily with breakfast.   Multiple Vitamins-Minerals (PRESERVISION/LUTEIN) CAPS Take 1 capsule by mouth daily with breakfast.   omeprazole (PRILOSEC) 20 MG  capsule Take 20 mg by mouth daily.   Polyethyl Glycol-Propyl Glycol (SYSTANE) 0.4-0.3 % SOLN Place 1-2 drops into both eyes 3 (three) times daily as needed (for dryness).    Sodium Fluoride (PREVIDENT 5000 BOOSTER PLUS) 1.1 % PSTE Place 1 application onto teeth every evening.    zonisamide (ZONEGRAN) 100 MG capsule Take 100 mg by mouth at bedtime.   No facility-administered encounter medications on file as of 03/28/2019.     Review of Systems  Review of Systems  Constitutional: Negative for activity change, appetite change, chills, diaphoresis, fatigue and fever.  HENT: Negative for  mouth sores, postnasal drip, rhinorrhea, sinus pain and sore throat.   Respiratory: Negative for apnea, cough, chest tightness, shortness of breath and wheezing.   Cardiovascular: Negative for chest pain, palpitations and leg swelling.  Gastrointestinal: Negative for abdominal distention, abdominal pain, constipation, diarrhea, nausea and vomiting.  Genitourinary: Negative for dysuria and frequency.  Musculoskeletal: Negative for arthralgias, joint swelling and myalgias.  Skin: Negative for rash.  Neurological: Negative for dizziness, syncope, weakness, light-headedness and numbness.  Psychiatric/Behavioral: Negative for behavioral problems, confusion and sleep disturbance.     Vitals:   03/28/19 1123  BP: 118/64  Pulse: 82  Resp: 20  Temp: 98.2 F (36.8 C)  TempSrc: Oral  SpO2: 94%  Weight: 193 lb 9.6 oz (87.8 kg)  Height: 5\' 3"  (1.6 m)   Body mass index is 34.29 kg/m. Physical Exam  Constitutional: Oriented to person, place, and time. Well-developed and well-nourished.  HENT:  Head: Normocephalic.  Mouth/Throat: Oropharynx is Dry Eyes: Pupils are equal, round, and reactive to light.  Neck: Neck supple.  Cardiovascular: Normal rate and normal heart sounds.  No murmur heard. Pulmonary/Chest: Effort normal and breath sounds normal. No respiratory distress. No wheezes. She has no rales.    Abdominal: Soft. Bowel sounds are normal. No distension. There is no tenderness. There is no rebound.  Musculoskeletal: No edema.  Lymphadenopathy: none Neurological: Alert and oriented to person, place, and time. No focal Deficits. Stood for me and able to walk Skin: Skin is warm and dry.  Psychiatric: Normal mood and affect. Behavior is normal. Thought content normal.    Labs reviewed: Basic Metabolic Panel: Recent Labs    10/19/18 03/26/19 1056 03/27/19 0422  NA 141 139 139  K 4.3 3.5 3.8  CL 106 105 106  CO2 30 26 23   GLUCOSE  --  96 101*  BUN 13 17 14   CREATININE 0.9 0.99 0.87  CALCIUM 9.0 9.4 9.2   Liver Function Tests: Recent Labs    10/05/18 10/19/18 03/26/19 1056  AST 18 13 20   ALT 19 15 25   ALKPHOS 96 101 113  BILITOT  --   --  0.4  PROT 6.8 6.5 6.9  ALBUMIN 4.0 3.7 3.5   No results for input(s): LIPASE, AMYLASE in the last 8760 hours. No results for input(s): AMMONIA in the last 8760 hours. CBC: Recent Labs    10/19/18 03/26/19 1056 03/27/19 0422  WBC 8.6 17.4* 14.1*  HGB 13.0 13.2 12.5  HCT 40 42.6 39.8  MCV  --  91.8 90.0  PLT 261 272 263   Cardiac Enzymes: No results for input(s): CKTOTAL, CKMB, CKMBINDEX, TROPONINI in the last 8760 hours. BNP: Invalid input(s): POCBNP Lab Results  Component Value Date   HGBA1C 6.2 04/16/2016   Lab Results  Component Value Date   TSH 1.601 03/26/2019   No results found for: VITAMINB12 No results found for: FOLATE No results found for: IRON, TIBC, FERRITIN  Imaging and Procedures obtained prior to SNF admission: Ct Head Wo Contrast  Result Date: 03/26/2019 CLINICAL DATA:  Facial trauma. EXAM: CT HEAD WITHOUT CONTRAST CT MAXILLOFACIAL WITHOUT CONTRAST CT CERVICAL SPINE WITHOUT CONTRAST TECHNIQUE: Multidetector CT imaging of the head, cervical spine, and maxillofacial structures were performed using the standard protocol without intravenous contrast. Multiplanar CT image reconstructions of the cervical  spine and maxillofacial structures were also generated. COMPARISON:  None. FINDINGS: CT HEAD FINDINGS Brain: No evidence of acute infarction, hemorrhage, hydrocephalus, extra-axial collection or mass lesion/mass effect. Small old lacunar infarction is identified  in the right basal ganglia. There is chronic diffuse atrophy. Chronic bilateral periventricular white matter small vessel ischemic changes noted. Vascular: No hyperdense vessel is noted. Skull: Normal. Negative for fracture or focal lesion. Other: A midline frontal scalp laceration with air and swelling of soft tissue. CT MAXILLOFACIAL FINDINGS Osseous: No fracture or mandibular dislocation. No destructive process. Orbits: Negative. No traumatic or inflammatory finding. Sinuses: Mild mucoperiosteal thickening the right maxillary sinus is identified. Bilateral ostiomeatal complexes are patent. Soft tissues: Negative. CT CERVICAL SPINE FINDINGS Alignment: Normal. Skull base and vertebrae: No acute fracture. No primary bone lesion or focal pathologic process. Soft tissues and spinal canal: No prevertebral fluid or swelling. No visible canal hematoma. Disc levels: Degenerative joint changes with narrowed joint space and osteophyte formation identified in the mid and in the lower cervical spine. Upper chest: Negative. Other: None. IMPRESSION: No focal acute intracranial abnormality identified. Midline frontal scalp laceration with air and swelling of the soft tissues. No acute fracture or dislocation of maxillofacial bones and cervical spine. Electronically Signed   By: Abelardo Diesel M.D.   On: 03/26/2019 11:35   Ct Cervical Spine Wo Contrast  Result Date: 03/26/2019 CLINICAL DATA:  Facial trauma. EXAM: CT HEAD WITHOUT CONTRAST CT MAXILLOFACIAL WITHOUT CONTRAST CT CERVICAL SPINE WITHOUT CONTRAST TECHNIQUE: Multidetector CT imaging of the head, cervical spine, and maxillofacial structures were performed using the standard protocol without intravenous contrast.  Multiplanar CT image reconstructions of the cervical spine and maxillofacial structures were also generated. COMPARISON:  None. FINDINGS: CT HEAD FINDINGS Brain: No evidence of acute infarction, hemorrhage, hydrocephalus, extra-axial collection or mass lesion/mass effect. Small old lacunar infarction is identified in the right basal ganglia. There is chronic diffuse atrophy. Chronic bilateral periventricular white matter small vessel ischemic changes noted. Vascular: No hyperdense vessel is noted. Skull: Normal. Negative for fracture or focal lesion. Other: A midline frontal scalp laceration with air and swelling of soft tissue. CT MAXILLOFACIAL FINDINGS Osseous: No fracture or mandibular dislocation. No destructive process. Orbits: Negative. No traumatic or inflammatory finding. Sinuses: Mild mucoperiosteal thickening the right maxillary sinus is identified. Bilateral ostiomeatal complexes are patent. Soft tissues: Negative. CT CERVICAL SPINE FINDINGS Alignment: Normal. Skull base and vertebrae: No acute fracture. No primary bone lesion or focal pathologic process. Soft tissues and spinal canal: No prevertebral fluid or swelling. No visible canal hematoma. Disc levels: Degenerative joint changes with narrowed joint space and osteophyte formation identified in the mid and in the lower cervical spine. Upper chest: Negative. Other: None. IMPRESSION: No focal acute intracranial abnormality identified. Midline frontal scalp laceration with air and swelling of the soft tissues. No acute fracture or dislocation of maxillofacial bones and cervical spine. Electronically Signed   By: Abelardo Diesel M.D.   On: 03/26/2019 11:35   Dg Chest Port 1 View  Result Date: 03/26/2019 CLINICAL DATA:  Fall.  Seizure. EXAM: PORTABLE CHEST 1 VIEW COMPARISON:  None. FINDINGS: The heart size and mediastinal contours are within normal limits. Both lungs are clear. The visualized skeletal structures are unremarkable. IMPRESSION: No active  disease. Electronically Signed   By: Monte Fantasia M.D.   On: 03/26/2019 11:00   Ct Maxillofacial Wo Contrast  Result Date: 03/26/2019 CLINICAL DATA:  Facial trauma. EXAM: CT HEAD WITHOUT CONTRAST CT MAXILLOFACIAL WITHOUT CONTRAST CT CERVICAL SPINE WITHOUT CONTRAST TECHNIQUE: Multidetector CT imaging of the head, cervical spine, and maxillofacial structures were performed using the standard protocol without intravenous contrast. Multiplanar CT image reconstructions of the cervical spine and maxillofacial structures were  also generated. COMPARISON:  None. FINDINGS: CT HEAD FINDINGS Brain: No evidence of acute infarction, hemorrhage, hydrocephalus, extra-axial collection or mass lesion/mass effect. Small old lacunar infarction is identified in the right basal ganglia. There is chronic diffuse atrophy. Chronic bilateral periventricular white matter small vessel ischemic changes noted. Vascular: No hyperdense vessel is noted. Skull: Normal. Negative for fracture or focal lesion. Other: A midline frontal scalp laceration with air and swelling of soft tissue. CT MAXILLOFACIAL FINDINGS Osseous: No fracture or mandibular dislocation. No destructive process. Orbits: Negative. No traumatic or inflammatory finding. Sinuses: Mild mucoperiosteal thickening the right maxillary sinus is identified. Bilateral ostiomeatal complexes are patent. Soft tissues: Negative. CT CERVICAL SPINE FINDINGS Alignment: Normal. Skull base and vertebrae: No acute fracture. No primary bone lesion or focal pathologic process. Soft tissues and spinal canal: No prevertebral fluid or swelling. No visible canal hematoma. Disc levels: Degenerative joint changes with narrowed joint space and osteophyte formation identified in the mid and in the lower cervical spine. Upper chest: Negative. Other: None. IMPRESSION: No focal acute intracranial abnormality identified. Midline frontal scalp laceration with air and swelling of the soft tissues. No acute  fracture or dislocation of maxillofacial bones and cervical spine. Electronically Signed   By: Abelardo Diesel M.D.   On: 03/26/2019 11:35    Assessment/Plan LOC/Seizures Restarted back on Zonegran Patient had a complete negative work-up She needs to follow-up with neurology in 2 to 3 weeks Possible UTI Patient states she has history of recurrent UTI. She wants to see a urologist as outpatient HLD Continue on Lipitor  Patient wants to establish with hemophilia care as PCP.  She will come for follow-up in the clinic after discharge from acute facility  Family/ staff Communication:   Labs/tests ordered: Total time spent in this patient care encounter was  35_  minutes; greater than 50% of the visit spent counseling patient and staff, reviewing records , Labs and coordinating care for problems addressed at this encounter.

## 2019-04-01 DIAGNOSIS — B342 Coronavirus infection, unspecified: Secondary | ICD-10-CM | POA: Diagnosis not present

## 2019-04-05 DIAGNOSIS — N390 Urinary tract infection, site not specified: Secondary | ICD-10-CM | POA: Diagnosis not present

## 2019-04-08 ENCOUNTER — Non-Acute Institutional Stay (SKILLED_NURSING_FACILITY): Payer: Medicare Other | Admitting: Nurse Practitioner

## 2019-04-08 ENCOUNTER — Encounter: Payer: Self-pay | Admitting: Nurse Practitioner

## 2019-04-08 DIAGNOSIS — E785 Hyperlipidemia, unspecified: Secondary | ICD-10-CM | POA: Diagnosis not present

## 2019-04-08 DIAGNOSIS — N390 Urinary tract infection, site not specified: Secondary | ICD-10-CM | POA: Diagnosis not present

## 2019-04-08 DIAGNOSIS — R269 Unspecified abnormalities of gait and mobility: Secondary | ICD-10-CM

## 2019-04-08 DIAGNOSIS — R3911 Hesitancy of micturition: Secondary | ICD-10-CM

## 2019-04-08 DIAGNOSIS — G40909 Epilepsy, unspecified, not intractable, without status epilepticus: Secondary | ICD-10-CM

## 2019-04-08 DIAGNOSIS — I10 Essential (primary) hypertension: Secondary | ICD-10-CM | POA: Diagnosis not present

## 2019-04-08 MED ORDER — VITAMIN D-3 25 MCG (1000 UT) PO CAPS: 1000.0000 [IU] | ORAL_CAPSULE | Freq: Every day | ORAL | 0 refills | Status: DC

## 2019-04-08 MED ORDER — ATORVASTATIN CALCIUM 20 MG PO TABS: 20.0000 mg | ORAL_TABLET | Freq: Every day | ORAL | 0 refills | Status: DC

## 2019-04-08 MED ORDER — ZONISAMIDE 100 MG PO CAPS: 100.0000 mg | ORAL_CAPSULE | Freq: Every day | ORAL | 0 refills | Status: DC

## 2019-04-08 NOTE — Assessment & Plan Note (Signed)
Continue to monitor for s/s of UTI. Encourage oral fluid intakes and physical activities.

## 2019-04-08 NOTE — Assessment & Plan Note (Signed)
Stable, f/u Neurology, continue Zonisamide 100mg  qd.

## 2019-04-08 NOTE — Progress Notes (Signed)
Location:   [facility] [place] Room Number: 562 Place of Service:  SNF (31)  Provider: Marlana Latus NP  PCP: Marcelene Weidemann X, NP Patient Care Team: Reyhan Moronta X, NP as PCP - General (Internal Medicine) Rosita Fire, MD as Referring Physician (Neurology) Dorothe Pea, Jeri Lager, MD as Referring Physician (Dermatology)  Extended Emergency Contact Information Primary Emergency Contact: Nies,James Address: [place] [address] [place] Phone: [phone] Relation: Son Secondary Emergency Contact: Capriotti,Caroline Address: [place] [address] [place] Phone: [phone] Work Phone: [phone] Mobile Phone: [phone] Relation: Daughter  Code Status: DNR Goals of care:  Advanced Directive information Advanced Directives 04/08/2019  Does Patient Have a Medical Advance Directive? Yes  Type of Advance Directive Out of facility DNR (pink MOST or yellow form);Living will;Healthcare Power of Attorney  Does patient want to make changes to medical advance directive? No - Patient declined  Copy of [place] in Chart? Yes - validated most recent copy scanned in chart (See row information)  Pre-existing out of facility DNR order (yellow form or pink MOST form) Yellow form placed in chart (order not valid for inpatient use)     Allergies  Allergen Reactions   Barbiturates Other (See Comments)    A small amount "overdosed" the patient- "affected my stability and balance" (might have been given at the same as another med?)   Cefdinir Other (See Comments)    A small amount "overdosed" the patient- "affected my stability and balance" (might have been given at the same as another med?)   Phenobarbital Other (See Comments)    A small amount "overdosed" the patient- "affected my stability and balance" (might have been given at the same as another med?)   Tegretol  [Carbamazepine] Other (See Comments)    A small amount "overdosed" the patient- "affected my stability and balance" (might have been given at the same as another med?)    Chief Complaint  Patient presents with   Discharge Note    Discharge from facility     HPI:  78 y.o. female with medical history of seizures, on Zonisamide 100mg  qd, Hyperlipidemia, on Atorvastatin 20mg  qd admitted to SNF [facility] following hospitalization 03/26/19-03/27/19 for loss of consciousness, hit her forehead, face, and knocked teeth loose from fall. CT head negative, no further s/s of infection, focal weakness, orthostatic blood pressure changes, or active seizures. She c/o urinary hesitancy, denied dysuria, abd or CVA tenderness, no generalized weakness, nausea, vomiting, or hematuria. UA 04/05/19 showed clear, none bacteria, multiple organisms, each less than 10,000c/ml. The patient agreed to monitor for s/s of UTI instead of re UA C/S. She has regained her physical strength and ADL function. She is hemodynamically stable to return [place] [facility].      Past Medical History:  Diagnosis Date   Arthritis    Cancer [facility])    skin   Cataract 2010   Dr. Alexander [place], Dr.. Jerlyn Ly @ [facility]   Epilepsy [facility]) 08/11/2015   Hyperlipidemia 12/08/2015   Hypertension    Polyp of colon 08/11/2015    Past Surgical History:  Procedure Laterality Date   [place]   COLON SURGERY  09/12/2006   FEMUR SURGERY  2018   Broken   TONSILLECTOMY  09/12/1944  reports that she has never smoked. She has never used smokeless tobacco. She reports that she does not drink alcohol or use drugs. Social History   Socioeconomic History   Marital status: Widowed    Spouse name: Not on file   Number of children: Not on file   Years of education: Not on file   Highest education level: Not on file  Occupational History   Occupation: Electrical engineer    Comment: retired  Airline pilot strain: Not on file   Food insecurity    Worry: Not on file    Inability: Not on Lexicographer needs    Medical: Not on file    Non-medical: Not on file  Tobacco Use   Smoking status: Never Smoker   Smokeless tobacco: Never Used  Substance and Sexual Activity   Alcohol use: Never    Frequency: Never   Drug use: Never   Sexual activity: Not on file  Lifestyle   Physical activity    Days per week: Not on file    Minutes per session: Not on file   Stress: Not on file  Relationships   Social connections    Talks on phone: Not on file    Gets together: Not on file    Attends religious service: Not on file    Active member of club or organization: Not on file    Attends meetings of clubs or organizations: Not on file    Relationship status: Not on file   Intimate partner violence    Fear of current or ex partner: Not on file    Emotionally abused: Not on file    Physically abused: Not on file    Forced sexual activity: Not on file  Other Topics Concern   Not on file  Social History Narrative   Social History     Socioeconomic History       Marital status: Widowed       Number of children:2       Years of education:  AB in Art [phone])       Highest education level: Bachelors in      Occupational History : Engineer, 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       Food insecurity:         Worry: Not on file         Inability: Not on file       Transportation needs:         Medical: Not on file         Non-medical: Not on file     Tobacco Use       Smoking status: Not on file     Substance and Sexual Activity       Alcohol use: Not on file       Drug use: Not on file       Sexual activity: Not on file     Lifestyle       Physical activity:         Days per week: Not on file         Minutes per session: A little       Stress: Not on file     Relationships       Social  connections:  Talks on phone: Not on file         Gets together: Not on file         Attends religious service: Not on file         Active member of club or organization: Not on file         Attends meetings of clubs or organizations: Not on file         Relationship status: Not on file       Intimate partner violence:         Fear of current or ex partner: Not on file         Emotionally abused: Not on file         Physically abused: Not on file         Forced sexual activity: Not on file     Other Topics       Concerns:         Not on file     Social History Narrative       Not on file      Functional Status Survey:    Allergies  Allergen Reactions   Barbiturates Other (See Comments)    A small amount "overdosed" the patient- "affected my stability and balance" (might have been given at the same as another med?)   Cefdinir Other (See Comments)    A small amount "overdosed" the patient- "affected my stability and balance" (might have been given at the same as another med?)   Phenobarbital Other (See Comments)    A small amount "overdosed" the patient- "affected my stability and balance" (might have been given at the same as another med?)   Tegretol [Carbamazepine] Other (See Comments)    A small amount "overdosed" the patient- "affected my stability and balance" (might have been given at the same as another med?)    Pertinent  Health Maintenance Due  Topic Date Due   INFLUENZA VACCINE  04/13/2019   DEXA SCAN  Completed   PNA vac Low Risk Adult  Completed    Medications: Allergies as of 04/08/2019      Reactions   Barbiturates Other (See Comments)   A small amount "overdosed" the patient- "affected my stability and balance" (might have been given at the same as another med?)   Cefdinir Other (See Comments)   A small amount "overdosed" the patient- "affected my stability and balance" (might have been given at the same as another med?)   Phenobarbital Other  (See Comments)   A small amount "overdosed" the patient- "affected my stability and balance" (might have been given at the same as another med?)   Tegretol [carbamazepine] Other (See Comments)   A small amount "overdosed" the patient- "affected my stability and balance" (might have been given at the same as another med?)      Medication List       Accurate as of April 08, 2019 12:28 PM. If you have any questions, ask your nurse or doctor.        STOP taking these medications   omeprazole 20 MG capsule Commonly known as: PRILOSEC Stopped by: Marlow Berenguer X Rajvir Ernster, NP     TAKE these medications   atorvastatin 20 MG tablet Commonly known as: LIPITOR Take 1 tablet (20 mg total) by mouth daily.   One-A-Day Womens 50 Plus Tabs Take 1 tablet by mouth daily with breakfast.   PreserVision/Lutein Caps Take 1 capsule by mouth daily with breakfast.  PreviDent 5000 Booster Plus 1.1 % Pste Generic drug: Sodium Fluoride Place 1 application onto teeth every evening.   Systane 0.4-0.3 % Soln Generic drug: Polyethyl Glycol-Propyl Glycol Place 1-2 drops into both eyes 3 (three) times daily as needed (for dryness).   Vitamin D-3 25 MCG (1000 UT) Caps Take 1 capsule (1,000 Units total) by mouth daily with breakfast.   zonisamide 100 MG capsule Commonly known as: ZONEGRAN Take 1 capsule (100 mg total) by mouth at bedtime.      ROS was provided with assistance of staff Review of Systems  Constitutional: Negative for activity change, appetite change, chills, diaphoresis, fatigue and fever.  HENT: Positive for hearing loss. Negative for congestion and voice change.   Respiratory: Negative for cough, shortness of breath and wheezing.   Cardiovascular: Positive for leg swelling. Negative for chest pain and palpitations.  Gastrointestinal: Negative for abdominal distention, abdominal pain, constipation, diarrhea, nausea and vomiting.  Genitourinary: Negative for difficulty urinating, dysuria,  hematuria and urgency.       Urinary hesitancy   Musculoskeletal: Positive for gait problem.  Skin:       Mid forehead healing laceration.   Neurological: Negative for dizziness, seizures, syncope, facial asymmetry, weakness, numbness and headaches.       Memory lapses.   Psychiatric/Behavioral: Negative for agitation, behavioral problems, hallucinations and sleep disturbance. The patient is not nervous/anxious.     Vitals:   04/08/19 1000  BP: 140/62  Pulse: 80  Resp: 20  Temp: 98 F (36.7 C)  SpO2: 96%  Weight: 196 lb 12.8 oz (89.3 kg)  Height: 5\' 3"  (1.6 m)   Body mass index is 34.86 kg/m. Physical Exam Vitals signs and nursing note reviewed.  Constitutional:      General: She is not in acute distress.    Appearance: Normal appearance. She is obese. She is not ill-appearing, toxic-appearing or diaphoretic.  HENT:     Head: Normocephalic and atraumatic.     Nose: Nose normal.     Mouth/Throat:     Mouth: Mucous membranes are moist.  Eyes:     Extraocular Movements: Extraocular movements intact.     Conjunctiva/sclera: Conjunctivae normal.     Pupils: Pupils are equal, round, and reactive to light.  Neck:     Musculoskeletal: Normal range of motion and neck supple.  Cardiovascular:     Rate and Rhythm: Normal rate and regular rhythm.     Heart sounds: No murmur.  Pulmonary:     Effort: Pulmonary effort is normal.     Breath sounds: No wheezing, rhonchi or rales.  Abdominal:     General: Bowel sounds are normal.     Palpations: Abdomen is soft.     Tenderness: There is no abdominal tenderness. There is no guarding.  Musculoskeletal:     Right lower leg: Edema present.     Left lower leg: Edema present.     Comments: Trace edema BLE, ambulates with walker.   Skin:    General: Skin is warm and dry.     Comments: Mid forehead healing laceration.   Neurological:     General: No focal deficit present.     Mental Status: She is alert. Mental status is at baseline.       Cranial Nerves: No cranial nerve deficit.     Motor: No weakness.     Coordination: Coordination normal.     Gait: Gait abnormal.     Comments: Oriented to person and place.  Psychiatric:        Mood and Affect: Mood normal.        Behavior: Behavior normal.        Thought Content: Thought content normal.     Labs reviewed: Basic Metabolic Panel: Recent Labs    10/19/18 03/26/19 1056 03/27/19 0422  NA 141 139 139  K 4.3 3.5 3.8  CL 106 105 106  CO2 30 26 23   GLUCOSE  --  96 101*  BUN 13 17 14   CREATININE 0.9 0.99 0.87  CALCIUM 9.0 9.4 9.2   Liver Function Tests: Recent Labs    10/05/18 10/19/18 03/26/19 1056  AST 18 13 20   ALT 19 15 25   ALKPHOS 96 101 113  BILITOT  --   --  0.4  PROT 6.8 6.5 6.9  ALBUMIN 4.0 3.7 3.5   No results for input(s): LIPASE, AMYLASE in the last 8760 hours. No results for input(s): AMMONIA in the last 8760 hours. CBC: Recent Labs    10/19/18 03/26/19 1056 03/27/19 0422  WBC 8.6 17.4* 14.1*  HGB 13.0 13.2 12.5  HCT 40 42.6 39.8  MCV  --  91.8 90.0  PLT 261 272 263   Cardiac Enzymes: No results for input(s): CKTOTAL, CKMB, CKMBINDEX, TROPONINI in the last 8760 hours. BNP: Invalid input(s): POCBNP CBG: No results for input(s): GLUCAP in the last 8760 hours.  Procedures and Imaging Studies During Stay: Ct Head Wo Contrast  Result Date: 03/26/2019 CLINICAL DATA:  Facial trauma. EXAM: CT HEAD WITHOUT CONTRAST CT MAXILLOFACIAL WITHOUT CONTRAST CT CERVICAL SPINE WITHOUT CONTRAST TECHNIQUE: Multidetector CT imaging of the head, cervical spine, and maxillofacial structures were performed using the standard protocol without intravenous contrast. Multiplanar CT image reconstructions of the cervical spine and maxillofacial structures were also generated. COMPARISON:  None. FINDINGS: CT HEAD FINDINGS Brain: No evidence of acute infarction, hemorrhage, hydrocephalus, extra-axial collection or mass lesion/mass effect. Small old lacunar  infarction is identified in the right basal ganglia. There is chronic diffuse atrophy. Chronic bilateral periventricular white matter small vessel ischemic changes noted. Vascular: No hyperdense vessel is noted. Skull: Normal. Negative for fracture or focal lesion. Other: A midline frontal scalp laceration with air and swelling of soft tissue. CT MAXILLOFACIAL FINDINGS Osseous: No fracture or mandibular dislocation. No destructive process. Orbits: Negative. No traumatic or inflammatory finding. Sinuses: Mild mucoperiosteal thickening the right maxillary sinus is identified. Bilateral ostiomeatal complexes are patent. Soft tissues: Negative. CT CERVICAL SPINE FINDINGS Alignment: Normal. Skull base and vertebrae: No acute fracture. No primary bone lesion or focal pathologic process. Soft tissues and spinal canal: No prevertebral fluid or swelling. No visible canal hematoma. Disc levels: Degenerative joint changes with narrowed joint space and osteophyte formation identified in the mid and in the lower cervical spine. Upper chest: Negative. Other: None. IMPRESSION: No focal acute intracranial abnormality identified. Midline frontal scalp laceration with air and swelling of the soft tissues. No acute fracture or dislocation of maxillofacial bones and cervical spine. Electronically Signed   By: Abelardo Diesel M.D.   On: 03/26/2019 11:35   Ct Cervical Spine Wo Contrast  Result Date: 03/26/2019 CLINICAL DATA:  Facial trauma. EXAM: CT HEAD WITHOUT CONTRAST CT MAXILLOFACIAL WITHOUT CONTRAST CT CERVICAL SPINE WITHOUT CONTRAST TECHNIQUE: Multidetector CT imaging of the head, cervical spine, and maxillofacial structures were performed using the standard protocol without intravenous contrast. Multiplanar CT image reconstructions of the cervical spine and maxillofacial structures were also generated. COMPARISON:  None. FINDINGS: CT HEAD FINDINGS Brain: No evidence of  acute infarction, hemorrhage, hydrocephalus, extra-axial  collection or mass lesion/mass effect. Small old lacunar infarction is identified in the right basal ganglia. There is chronic diffuse atrophy. Chronic bilateral periventricular white matter small vessel ischemic changes noted. Vascular: No hyperdense vessel is noted. Skull: Normal. Negative for fracture or focal lesion. Other: A midline frontal scalp laceration with air and swelling of soft tissue. CT MAXILLOFACIAL FINDINGS Osseous: No fracture or mandibular dislocation. No destructive process. Orbits: Negative. No traumatic or inflammatory finding. Sinuses: Mild mucoperiosteal thickening the right maxillary sinus is identified. Bilateral ostiomeatal complexes are patent. Soft tissues: Negative. CT CERVICAL SPINE FINDINGS Alignment: Normal. Skull base and vertebrae: No acute fracture. No primary bone lesion or focal pathologic process. Soft tissues and spinal canal: No prevertebral fluid or swelling. No visible canal hematoma. Disc levels: Degenerative joint changes with narrowed joint space and osteophyte formation identified in the mid and in the lower cervical spine. Upper chest: Negative. Other: None. IMPRESSION: No focal acute intracranial abnormality identified. Midline frontal scalp laceration with air and swelling of the soft tissues. No acute fracture or dislocation of maxillofacial bones and cervical spine. Electronically Signed   By: Abelardo Diesel M.D.   On: 03/26/2019 11:35   Dg Chest Port 1 View  Result Date: 03/26/2019 CLINICAL DATA:  Fall.  Seizure. EXAM: PORTABLE CHEST 1 VIEW COMPARISON:  None. FINDINGS: The heart size and mediastinal contours are within normal limits. Both lungs are clear. The visualized skeletal structures are unremarkable. IMPRESSION: No active disease. Electronically Signed   By: Monte Fantasia M.D.   On: 03/26/2019 11:00   Ct Maxillofacial Wo Contrast  Result Date: 03/26/2019 CLINICAL DATA:  Facial trauma. EXAM: CT HEAD WITHOUT CONTRAST CT MAXILLOFACIAL WITHOUT  CONTRAST CT CERVICAL SPINE WITHOUT CONTRAST TECHNIQUE: Multidetector CT imaging of the head, cervical spine, and maxillofacial structures were performed using the standard protocol without intravenous contrast. Multiplanar CT image reconstructions of the cervical spine and maxillofacial structures were also generated. COMPARISON:  None. FINDINGS: CT HEAD FINDINGS Brain: No evidence of acute infarction, hemorrhage, hydrocephalus, extra-axial collection or mass lesion/mass effect. Small old lacunar infarction is identified in the right basal ganglia. There is chronic diffuse atrophy. Chronic bilateral periventricular white matter small vessel ischemic changes noted. Vascular: No hyperdense vessel is noted. Skull: Normal. Negative for fracture or focal lesion. Other: A midline frontal scalp laceration with air and swelling of soft tissue. CT MAXILLOFACIAL FINDINGS Osseous: No fracture or mandibular dislocation. No destructive process. Orbits: Negative. No traumatic or inflammatory finding. Sinuses: Mild mucoperiosteal thickening the right maxillary sinus is identified. Bilateral ostiomeatal complexes are patent. Soft tissues: Negative. CT CERVICAL SPINE FINDINGS Alignment: Normal. Skull base and vertebrae: No acute fracture. No primary bone lesion or focal pathologic process. Soft tissues and spinal canal: No prevertebral fluid or swelling. No visible canal hematoma. Disc levels: Degenerative joint changes with narrowed joint space and osteophyte formation identified in the mid and in the lower cervical spine. Upper chest: Negative. Other: None. IMPRESSION: No focal acute intracranial abnormality identified. Midline frontal scalp laceration with air and swelling of the soft tissues. No acute fracture or dislocation of maxillofacial bones and cervical spine. Electronically Signed   By: Abelardo Diesel M.D.   On: 03/26/2019 11:35    Assessment/Plan:   Essential hypertension Blood pressure is controlled.   Seizure  disorder ([place]) Stable, f/u Neurology, continue Zonisamide 100mg  qd.   UTI (urinary tract infection) Continue to monitor for s/s of UTI. Encourage oral fluid intakes and physical activities.  Gait abnormality Continue to ambulate with walker  Hyperlipidemia Stable, continue Atorvastatin 20mg  qd  Urinary hesitancy Not new, continue well hydration, physical activity, monitor for s/s of UTI   Patient is being discharged with the following home health services:    Patient is being discharged with the following durable medical equipment:    Patient has been advised to f/u with their PCP in 1-2 weeks to bring them up to date on their rehab stay.  Social services at facility was responsible for arranging this appointment.  Pt was provided with a 30 day supply of prescriptions for medications and refills must be obtained from their PCP.  For controlled substances, a more limited supply may be provided adequate until PCP appointment only.  Future labs/tests needed:  none

## 2019-04-08 NOTE — Assessment & Plan Note (Signed)
Continue to ambulate with walker.  

## 2019-04-08 NOTE — Assessment & Plan Note (Signed)
Not new, continue well hydration, physical activity, monitor for s/s of UTI

## 2019-04-08 NOTE — Assessment & Plan Note (Signed)
Stable, continue Atorvastatin 20mg  qd

## 2019-04-08 NOTE — Assessment & Plan Note (Signed)
Blood pressure is controlled

## 2019-04-09 DIAGNOSIS — G40909 Epilepsy, unspecified, not intractable, without status epilepticus: Secondary | ICD-10-CM | POA: Diagnosis not present

## 2019-04-09 DIAGNOSIS — M6281 Muscle weakness (generalized): Secondary | ICD-10-CM | POA: Diagnosis not present

## 2019-04-09 DIAGNOSIS — R2681 Unsteadiness on feet: Secondary | ICD-10-CM | POA: Diagnosis not present

## 2019-04-09 DIAGNOSIS — R41841 Cognitive communication deficit: Secondary | ICD-10-CM | POA: Diagnosis not present

## 2019-04-10 DIAGNOSIS — M6281 Muscle weakness (generalized): Secondary | ICD-10-CM | POA: Diagnosis not present

## 2019-04-10 DIAGNOSIS — R41841 Cognitive communication deficit: Secondary | ICD-10-CM | POA: Diagnosis not present

## 2019-04-10 DIAGNOSIS — G40909 Epilepsy, unspecified, not intractable, without status epilepticus: Secondary | ICD-10-CM | POA: Diagnosis not present

## 2019-04-10 DIAGNOSIS — R2681 Unsteadiness on feet: Secondary | ICD-10-CM | POA: Diagnosis not present

## 2019-04-12 DIAGNOSIS — R2681 Unsteadiness on feet: Secondary | ICD-10-CM | POA: Diagnosis not present

## 2019-04-12 DIAGNOSIS — R41841 Cognitive communication deficit: Secondary | ICD-10-CM | POA: Diagnosis not present

## 2019-04-12 DIAGNOSIS — M6281 Muscle weakness (generalized): Secondary | ICD-10-CM | POA: Diagnosis not present

## 2019-04-12 DIAGNOSIS — G40909 Epilepsy, unspecified, not intractable, without status epilepticus: Secondary | ICD-10-CM | POA: Diagnosis not present

## 2019-04-15 DIAGNOSIS — M6281 Muscle weakness (generalized): Secondary | ICD-10-CM | POA: Diagnosis not present

## 2019-04-15 DIAGNOSIS — R2681 Unsteadiness on feet: Secondary | ICD-10-CM | POA: Diagnosis not present

## 2019-04-15 DIAGNOSIS — R41841 Cognitive communication deficit: Secondary | ICD-10-CM | POA: Diagnosis not present

## 2019-04-15 DIAGNOSIS — N3946 Mixed incontinence: Secondary | ICD-10-CM | POA: Diagnosis not present

## 2019-04-15 DIAGNOSIS — Z87440 Personal history of urinary (tract) infections: Secondary | ICD-10-CM | POA: Diagnosis not present

## 2019-04-15 DIAGNOSIS — G40909 Epilepsy, unspecified, not intractable, without status epilepticus: Secondary | ICD-10-CM | POA: Diagnosis not present

## 2019-04-17 DIAGNOSIS — G40909 Epilepsy, unspecified, not intractable, without status epilepticus: Secondary | ICD-10-CM | POA: Diagnosis not present

## 2019-04-17 DIAGNOSIS — R2681 Unsteadiness on feet: Secondary | ICD-10-CM | POA: Diagnosis not present

## 2019-04-17 DIAGNOSIS — N3946 Mixed incontinence: Secondary | ICD-10-CM | POA: Diagnosis not present

## 2019-04-17 DIAGNOSIS — M6281 Muscle weakness (generalized): Secondary | ICD-10-CM | POA: Diagnosis not present

## 2019-04-17 DIAGNOSIS — Z87440 Personal history of urinary (tract) infections: Secondary | ICD-10-CM | POA: Diagnosis not present

## 2019-04-17 DIAGNOSIS — R41841 Cognitive communication deficit: Secondary | ICD-10-CM | POA: Diagnosis not present

## 2019-04-18 DIAGNOSIS — R41841 Cognitive communication deficit: Secondary | ICD-10-CM | POA: Diagnosis not present

## 2019-04-18 DIAGNOSIS — N3946 Mixed incontinence: Secondary | ICD-10-CM | POA: Diagnosis not present

## 2019-04-18 DIAGNOSIS — M6281 Muscle weakness (generalized): Secondary | ICD-10-CM | POA: Diagnosis not present

## 2019-04-18 DIAGNOSIS — G40909 Epilepsy, unspecified, not intractable, without status epilepticus: Secondary | ICD-10-CM | POA: Diagnosis not present

## 2019-04-18 DIAGNOSIS — Z87440 Personal history of urinary (tract) infections: Secondary | ICD-10-CM | POA: Diagnosis not present

## 2019-04-18 DIAGNOSIS — R2681 Unsteadiness on feet: Secondary | ICD-10-CM | POA: Diagnosis not present

## 2019-04-19 DIAGNOSIS — N3946 Mixed incontinence: Secondary | ICD-10-CM | POA: Diagnosis not present

## 2019-04-19 DIAGNOSIS — M6281 Muscle weakness (generalized): Secondary | ICD-10-CM | POA: Diagnosis not present

## 2019-04-19 DIAGNOSIS — Z87440 Personal history of urinary (tract) infections: Secondary | ICD-10-CM | POA: Diagnosis not present

## 2019-04-19 DIAGNOSIS — G40909 Epilepsy, unspecified, not intractable, without status epilepticus: Secondary | ICD-10-CM | POA: Diagnosis not present

## 2019-04-19 DIAGNOSIS — R2681 Unsteadiness on feet: Secondary | ICD-10-CM | POA: Diagnosis not present

## 2019-04-19 DIAGNOSIS — R41841 Cognitive communication deficit: Secondary | ICD-10-CM | POA: Diagnosis not present

## 2019-04-23 DIAGNOSIS — L218 Other seborrheic dermatitis: Secondary | ICD-10-CM | POA: Diagnosis not present

## 2019-04-23 DIAGNOSIS — Z87440 Personal history of urinary (tract) infections: Secondary | ICD-10-CM | POA: Diagnosis not present

## 2019-04-23 DIAGNOSIS — R41841 Cognitive communication deficit: Secondary | ICD-10-CM | POA: Diagnosis not present

## 2019-04-23 DIAGNOSIS — L814 Other melanin hyperpigmentation: Secondary | ICD-10-CM | POA: Diagnosis not present

## 2019-04-23 DIAGNOSIS — R2681 Unsteadiness on feet: Secondary | ICD-10-CM | POA: Diagnosis not present

## 2019-04-23 DIAGNOSIS — L570 Actinic keratosis: Secondary | ICD-10-CM | POA: Diagnosis not present

## 2019-04-23 DIAGNOSIS — N3946 Mixed incontinence: Secondary | ICD-10-CM | POA: Diagnosis not present

## 2019-04-23 DIAGNOSIS — L905 Scar conditions and fibrosis of skin: Secondary | ICD-10-CM | POA: Diagnosis not present

## 2019-04-23 DIAGNOSIS — D2239 Melanocytic nevi of other parts of face: Secondary | ICD-10-CM | POA: Diagnosis not present

## 2019-04-23 DIAGNOSIS — Z85828 Personal history of other malignant neoplasm of skin: Secondary | ICD-10-CM | POA: Diagnosis not present

## 2019-04-23 DIAGNOSIS — M6281 Muscle weakness (generalized): Secondary | ICD-10-CM | POA: Diagnosis not present

## 2019-04-23 DIAGNOSIS — G40909 Epilepsy, unspecified, not intractable, without status epilepticus: Secondary | ICD-10-CM | POA: Diagnosis not present

## 2019-04-24 DIAGNOSIS — R41841 Cognitive communication deficit: Secondary | ICD-10-CM | POA: Diagnosis not present

## 2019-04-24 DIAGNOSIS — R2681 Unsteadiness on feet: Secondary | ICD-10-CM | POA: Diagnosis not present

## 2019-04-24 DIAGNOSIS — N3946 Mixed incontinence: Secondary | ICD-10-CM | POA: Diagnosis not present

## 2019-04-24 DIAGNOSIS — G40909 Epilepsy, unspecified, not intractable, without status epilepticus: Secondary | ICD-10-CM | POA: Diagnosis not present

## 2019-04-24 DIAGNOSIS — M6281 Muscle weakness (generalized): Secondary | ICD-10-CM | POA: Diagnosis not present

## 2019-04-24 DIAGNOSIS — Z87440 Personal history of urinary (tract) infections: Secondary | ICD-10-CM | POA: Diagnosis not present

## 2019-04-25 ENCOUNTER — Encounter: Payer: Self-pay | Admitting: Nurse Practitioner

## 2019-04-25 ENCOUNTER — Non-Acute Institutional Stay: Payer: Medicare Other | Admitting: Nurse Practitioner

## 2019-04-25 ENCOUNTER — Other Ambulatory Visit: Payer: Self-pay

## 2019-04-25 DIAGNOSIS — R3911 Hesitancy of micturition: Secondary | ICD-10-CM | POA: Diagnosis not present

## 2019-04-25 DIAGNOSIS — G40909 Epilepsy, unspecified, not intractable, without status epilepticus: Secondary | ICD-10-CM | POA: Diagnosis not present

## 2019-04-25 DIAGNOSIS — I10 Essential (primary) hypertension: Secondary | ICD-10-CM

## 2019-04-25 NOTE — Assessment & Plan Note (Signed)
Will refer to Urology, monitor for s/s UTI

## 2019-04-25 NOTE — Assessment & Plan Note (Signed)
Stable, continue Zonegran 100mg  qd

## 2019-04-25 NOTE — Assessment & Plan Note (Signed)
Blood pressure is controlled

## 2019-04-25 NOTE — Progress Notes (Signed)
Location:   clinic [facility]   Place of Service:  Clinic (12) Provider: Marlana Latus NP  Code Status: DNR Goals of Care: [place] 04/08/2019  Does Patient Have a Medical Advance Directive? Yes  Type of Advance Directive Out of facility DNR (pink MOST or yellow form);Living will;Healthcare Power of Attorney  Does patient want to make changes to medical advance directive? No - Patient declined  Copy of [place] in Chart? Yes - validated most recent copy scanned in chart (See row information)  Pre-existing out of facility DNR order (yellow form or pink MOST form) Yellow form placed in chart (order not valid for inpatient use)     Chief Complaint  Patient presents with  . Medical Management of Chronic Issues    C/o -     HPI: Patient is a 78 y.o. female seen today for medical management of chronic diseases.    The patient has history of seizure, stable, taking Zonegran 100mg  qd. Hx of HTN, blood pressure is controlled w/o antihypertensive agent. Urinary hesitation, denied dysuria, urgency, or suprapubic/CVA tenderness/discomfort.    Past Medical History:  Diagnosis Date  . Arthritis   . Cancer ([place])    skin  . Cataract 2010   Dr. Alexander Mt, DrJerlyn Ly @ [place]  . Epilepsy ([place]) 08/11/2015  . Hyperlipidemia 12/08/2015  . Hypertension   . Polyp of colon 08/11/2015    Past Surgical History:  Procedure Laterality Date  . ABDOMINAL HYSTERECTOMY  1970  . APPENDECTOMY  1958  . BREAST SURGERY  1984  . COLON SURGERY  09/12/2006  . FEMUR SURGERY  2018   Broken  . TONSILLECTOMY  09/12/1944    Allergies  Allergen Reactions  . Barbiturates Other (See Comments)    A small amount "overdosed" the patient- "affected my stability and balance" (might have been given at the same as another med?)  . Cefdinir Other (See Comments)    A small amount "overdosed" the patient- "affected my stability and balance" (might have been given at the same as another med?)  .  Phenobarbital Other (See Comments)    A small amount "overdosed" the patient- "affected my stability and balance" (might have been given at the same as another med?)  . Tegretol [Carbamazepine] Other (See Comments)    A small amount "overdosed" the patient- "affected my stability and balance" (might have been given at the same as another med?)    Allergies as of 04/25/2019      Reactions   Barbiturates Other (See Comments)   A small amount "overdosed" the patient- "affected my stability and balance" (might have been given at the same as another med?)   Cefdinir Other (See Comments)   A small amount "overdosed" the patient- "affected my stability and balance" (might have been given at the same as another med?)   Phenobarbital Other (See Comments)   A small amount "overdosed" the patient- "affected my stability and balance" (might have been given at the same as another med?)   Tegretol [carbamazepine] Other (See Comments)   A small amount "overdosed" the patient- "affected my stability and balance" (might have been given at the same as another med?)      Medication List       Accurate as of April 25, 2019 11:59 PM. If you have any questions, ask your nurse or doctor.        atorvastatin 20 MG tablet Commonly known as: LIPITOR Take 1 tablet (20 mg total) by  mouth daily.   ketoconazole 2 % shampoo Commonly known as: NIZORAL Apply topically 2 (two) times daily.   One-A-Day Womens 50 Plus Tabs Take 1 tablet by mouth daily with breakfast.   PreserVision/Lutein Caps Take 1 capsule by mouth daily with breakfast.   PreviDent 5000 Booster Plus 1.1 % Pste Generic drug: Sodium Fluoride Place 1 application onto teeth every evening.   Systane 0.4-0.3 % Soln Generic drug: Polyethyl Glycol-Propyl Glycol Place 1-2 drops into both eyes 3 (three) times daily as needed (for dryness).   Vitamin D-3 25 MCG (1000 UT) Caps Take 1 capsule (1,000 Units total) by mouth daily with breakfast.    zonisamide 100 MG capsule Commonly known as: ZONEGRAN Take 1 capsule (100 mg total) by mouth at bedtime.       Review of Systems:  Review of Systems  Constitutional: Negative for activity change, appetite change, chills, diaphoresis, fatigue, fever and unexpected weight change.  HENT: Positive for hearing loss. Negative for congestion and voice change.   Respiratory: Negative for cough, shortness of breath and wheezing.   Cardiovascular: Positive for leg swelling. Negative for chest pain and palpitations.  Gastrointestinal: Negative for abdominal distention, abdominal pain, constipation, diarrhea, nausea and vomiting.  Genitourinary: Negative for difficulty urinating, dysuria and urgency.       Urinary hesitation  Musculoskeletal: Positive for gait problem. Negative for arthralgias.  Skin: Negative for color change and pallor.  Neurological: Negative for dizziness, speech difficulty, weakness and headaches.  Psychiatric/Behavioral: Negative for agitation, behavioral problems, hallucinations and sleep disturbance. The patient is not nervous/anxious.     Health Maintenance  Topic Date Due  . INFLUENZA VACCINE  04/13/2019  . TETANUS/TDAP  12/21/2025  . DEXA SCAN  Completed  . PNA vac Low Risk Adult  Completed    Physical Exam: Vitals:   04/25/19 1336  BP: 122/64  Pulse: 90  Resp: 20  Temp: 98.5 F (36.9 C)  SpO2: 97%  Weight: 194 lb (88 kg)  Height: 5\' 3"  (1.6 m)   Body mass index is 34.37 kg/m. Physical Exam Constitutional:      General: She is not in acute distress.    Appearance: Normal appearance. She is obese. She is not ill-appearing, toxic-appearing or diaphoretic.  HENT:     Head: Normocephalic and atraumatic.     Nose: Nose normal.     Mouth/Throat:     Mouth: Mucous membranes are moist.  Eyes:     Conjunctiva/sclera: Conjunctivae normal.     Pupils: Pupils are equal, round, and reactive to light.  Neck:     Musculoskeletal: Normal range of motion and  neck supple.  Cardiovascular:     Rate and Rhythm: Normal rate and regular rhythm.     Heart sounds: No murmur.  Pulmonary:     Effort: Pulmonary effort is normal.     Breath sounds: No wheezing, rhonchi or rales.  Abdominal:     General: Bowel sounds are normal. There is no distension.     Palpations: Abdomen is soft.     Tenderness: There is no abdominal tenderness. There is no left CVA tenderness, guarding or rebound.  Musculoskeletal:     Right lower leg: Edema present.     Left lower leg: Edema present.     Comments: 1+ edema BLE, ambulates with walker  Skin:    General: Skin is warm and dry.  Neurological:     General: No focal deficit present.     Mental Status: She is alert  and oriented to person, place, and time. Mental status is at baseline.     Cranial Nerves: No cranial nerve deficit.     Motor: No weakness.     Coordination: Coordination normal.     Gait: Gait abnormal.  Psychiatric:        Mood and Affect: Mood normal.        Behavior: Behavior normal.        Thought Content: Thought content normal.        Judgment: Judgment normal.     Labs reviewed: Basic Metabolic Panel: Recent Labs    10/19/18 03/26/19 1056 03/26/19 1510 03/27/19 0422  NA 141 139  --  139  K 4.3 3.5  --  3.8  CL 106 105  --  106  CO2 30 26  --  23  GLUCOSE  --  96  --  101*  BUN 13 17  --  14  CREATININE 0.9 0.99  --  0.87  CALCIUM 9.0 9.4  --  9.2  TSH 4.52  --  1.601  --    Liver Function Tests: Recent Labs    10/05/18 10/19/18 03/26/19 1056  AST 18 13 20   ALT 19 15 25   ALKPHOS 96 101 113  BILITOT  --   --  0.4  PROT 6.8 6.5 6.9  ALBUMIN 4.0 3.7 3.5   No results for input(s): LIPASE, AMYLASE in the last 8760 hours. No results for input(s): AMMONIA in the last 8760 hours. CBC: Recent Labs    10/19/18 03/26/19 1056 03/27/19 0422  WBC 8.6 17.4* 14.1*  HGB 13.0 13.2 12.5  HCT 40 42.6 39.8  MCV  --  91.8 90.0  PLT 261 272 263   Lipid Panel: Recent Labs     10/19/18 12/24/18 0000  CHOL 215* 172  HDL 48 49*  LDLCALC 141 104*  TRIG 134 99  CHOLHDL  --  3.5   Lab Results  Component Value Date   HGBA1C 6.2 04/16/2016    Procedures since last visit: No results found.  Assessment/Plan  Seizure disorder (HCC) Stable, continue Zonegran 100mg  qd  Essential hypertension Blood pressure is controlled.   Urinary hesitancy Will refer to Urology, monitor for s/s UTI   Labs/tests ordered:  None  Next appt:  4 months

## 2019-04-25 NOTE — Patient Instructions (Addendum)
Urinary hesitancy, referral made to Urology. F/u in clinic [facility] 4 months.

## 2019-04-26 ENCOUNTER — Encounter: Payer: Self-pay | Admitting: Nurse Practitioner

## 2019-04-26 DIAGNOSIS — N3946 Mixed incontinence: Secondary | ICD-10-CM | POA: Diagnosis not present

## 2019-04-26 DIAGNOSIS — Z87440 Personal history of urinary (tract) infections: Secondary | ICD-10-CM | POA: Diagnosis not present

## 2019-04-26 DIAGNOSIS — M6281 Muscle weakness (generalized): Secondary | ICD-10-CM | POA: Diagnosis not present

## 2019-04-26 DIAGNOSIS — G40909 Epilepsy, unspecified, not intractable, without status epilepticus: Secondary | ICD-10-CM | POA: Diagnosis not present

## 2019-04-26 DIAGNOSIS — R41841 Cognitive communication deficit: Secondary | ICD-10-CM | POA: Diagnosis not present

## 2019-04-26 DIAGNOSIS — R2681 Unsteadiness on feet: Secondary | ICD-10-CM | POA: Diagnosis not present

## 2019-04-29 DIAGNOSIS — R2681 Unsteadiness on feet: Secondary | ICD-10-CM | POA: Diagnosis not present

## 2019-04-29 DIAGNOSIS — R41841 Cognitive communication deficit: Secondary | ICD-10-CM | POA: Diagnosis not present

## 2019-04-29 DIAGNOSIS — Z87440 Personal history of urinary (tract) infections: Secondary | ICD-10-CM | POA: Diagnosis not present

## 2019-04-29 DIAGNOSIS — G40909 Epilepsy, unspecified, not intractable, without status epilepticus: Secondary | ICD-10-CM | POA: Diagnosis not present

## 2019-04-29 DIAGNOSIS — N3946 Mixed incontinence: Secondary | ICD-10-CM | POA: Diagnosis not present

## 2019-04-29 DIAGNOSIS — M6281 Muscle weakness (generalized): Secondary | ICD-10-CM | POA: Diagnosis not present

## 2019-05-01 DIAGNOSIS — G40909 Epilepsy, unspecified, not intractable, without status epilepticus: Secondary | ICD-10-CM | POA: Diagnosis not present

## 2019-05-01 DIAGNOSIS — R2681 Unsteadiness on feet: Secondary | ICD-10-CM | POA: Diagnosis not present

## 2019-05-01 DIAGNOSIS — M6281 Muscle weakness (generalized): Secondary | ICD-10-CM | POA: Diagnosis not present

## 2019-05-01 DIAGNOSIS — N3946 Mixed incontinence: Secondary | ICD-10-CM | POA: Diagnosis not present

## 2019-05-01 DIAGNOSIS — R41841 Cognitive communication deficit: Secondary | ICD-10-CM | POA: Diagnosis not present

## 2019-05-01 DIAGNOSIS — Z87440 Personal history of urinary (tract) infections: Secondary | ICD-10-CM | POA: Diagnosis not present

## 2019-05-03 DIAGNOSIS — N3946 Mixed incontinence: Secondary | ICD-10-CM | POA: Diagnosis not present

## 2019-05-03 DIAGNOSIS — M6281 Muscle weakness (generalized): Secondary | ICD-10-CM | POA: Diagnosis not present

## 2019-05-03 DIAGNOSIS — G40909 Epilepsy, unspecified, not intractable, without status epilepticus: Secondary | ICD-10-CM | POA: Diagnosis not present

## 2019-05-03 DIAGNOSIS — Z87440 Personal history of urinary (tract) infections: Secondary | ICD-10-CM | POA: Diagnosis not present

## 2019-05-03 DIAGNOSIS — R41841 Cognitive communication deficit: Secondary | ICD-10-CM | POA: Diagnosis not present

## 2019-05-03 DIAGNOSIS — R2681 Unsteadiness on feet: Secondary | ICD-10-CM | POA: Diagnosis not present

## 2019-05-07 DIAGNOSIS — G40909 Epilepsy, unspecified, not intractable, without status epilepticus: Secondary | ICD-10-CM | POA: Diagnosis not present

## 2019-05-07 DIAGNOSIS — M6281 Muscle weakness (generalized): Secondary | ICD-10-CM | POA: Diagnosis not present

## 2019-05-07 DIAGNOSIS — R41841 Cognitive communication deficit: Secondary | ICD-10-CM | POA: Diagnosis not present

## 2019-05-07 DIAGNOSIS — Z87440 Personal history of urinary (tract) infections: Secondary | ICD-10-CM | POA: Diagnosis not present

## 2019-05-07 DIAGNOSIS — R2681 Unsteadiness on feet: Secondary | ICD-10-CM | POA: Diagnosis not present

## 2019-05-07 DIAGNOSIS — N3946 Mixed incontinence: Secondary | ICD-10-CM | POA: Diagnosis not present

## 2019-05-09 DIAGNOSIS — R2681 Unsteadiness on feet: Secondary | ICD-10-CM | POA: Diagnosis not present

## 2019-05-09 DIAGNOSIS — M6281 Muscle weakness (generalized): Secondary | ICD-10-CM | POA: Diagnosis not present

## 2019-05-09 DIAGNOSIS — R41841 Cognitive communication deficit: Secondary | ICD-10-CM | POA: Diagnosis not present

## 2019-05-09 DIAGNOSIS — N3946 Mixed incontinence: Secondary | ICD-10-CM | POA: Diagnosis not present

## 2019-05-09 DIAGNOSIS — G40909 Epilepsy, unspecified, not intractable, without status epilepticus: Secondary | ICD-10-CM | POA: Diagnosis not present

## 2019-05-09 DIAGNOSIS — Z87440 Personal history of urinary (tract) infections: Secondary | ICD-10-CM | POA: Diagnosis not present

## 2019-05-10 DIAGNOSIS — Z87440 Personal history of urinary (tract) infections: Secondary | ICD-10-CM | POA: Diagnosis not present

## 2019-05-10 DIAGNOSIS — M6281 Muscle weakness (generalized): Secondary | ICD-10-CM | POA: Diagnosis not present

## 2019-05-10 DIAGNOSIS — R2681 Unsteadiness on feet: Secondary | ICD-10-CM | POA: Diagnosis not present

## 2019-05-10 DIAGNOSIS — G40909 Epilepsy, unspecified, not intractable, without status epilepticus: Secondary | ICD-10-CM | POA: Diagnosis not present

## 2019-05-10 DIAGNOSIS — R41841 Cognitive communication deficit: Secondary | ICD-10-CM | POA: Diagnosis not present

## 2019-05-10 DIAGNOSIS — N3946 Mixed incontinence: Secondary | ICD-10-CM | POA: Diagnosis not present

## 2019-06-10 ENCOUNTER — Other Ambulatory Visit: Payer: Self-pay | Admitting: Nurse Practitioner

## 2019-06-10 DIAGNOSIS — E785 Hyperlipidemia, unspecified: Secondary | ICD-10-CM

## 2019-07-05 DIAGNOSIS — H353132 Nonexudative age-related macular degeneration, bilateral, intermediate dry stage: Secondary | ICD-10-CM | POA: Diagnosis not present

## 2019-07-05 DIAGNOSIS — H43811 Vitreous degeneration, right eye: Secondary | ICD-10-CM | POA: Diagnosis not present

## 2019-07-05 DIAGNOSIS — H04123 Dry eye syndrome of bilateral lacrimal glands: Secondary | ICD-10-CM | POA: Diagnosis not present

## 2019-07-05 DIAGNOSIS — Z961 Presence of intraocular lens: Secondary | ICD-10-CM | POA: Diagnosis not present

## 2019-07-08 ENCOUNTER — Other Ambulatory Visit: Payer: Self-pay | Admitting: Nurse Practitioner

## 2019-07-08 DIAGNOSIS — E785 Hyperlipidemia, unspecified: Secondary | ICD-10-CM

## 2019-08-05 ENCOUNTER — Telehealth: Payer: Self-pay

## 2019-08-05 NOTE — Telephone Encounter (Signed)
Patient stated zonisamide 100 mg is too strong for her.  She has leg swelling, tingling in hands and feet, frequent urination during the night, and needs to drink a lot of water so that she doesn't become dehydrated.  She wants to cut or wean the medication.

## 2019-08-05 NOTE — Telephone Encounter (Signed)
Patient notified she needed to contact Neurology.  She stated she did not have a neurologist in [place] and would contact her neurologist in [place] to see if he could offer advice.

## 2019-08-07 DIAGNOSIS — R350 Frequency of micturition: Secondary | ICD-10-CM | POA: Diagnosis not present

## 2019-08-07 DIAGNOSIS — N390 Urinary tract infection, site not specified: Secondary | ICD-10-CM | POA: Diagnosis not present

## 2019-08-07 DIAGNOSIS — R351 Nocturia: Secondary | ICD-10-CM | POA: Diagnosis not present

## 2019-08-20 DIAGNOSIS — L905 Scar conditions and fibrosis of skin: Secondary | ICD-10-CM | POA: Diagnosis not present

## 2019-08-20 DIAGNOSIS — L218 Other seborrheic dermatitis: Secondary | ICD-10-CM | POA: Diagnosis not present

## 2019-08-20 DIAGNOSIS — L821 Other seborrheic keratosis: Secondary | ICD-10-CM | POA: Diagnosis not present

## 2019-08-20 DIAGNOSIS — L570 Actinic keratosis: Secondary | ICD-10-CM | POA: Diagnosis not present

## 2019-08-20 DIAGNOSIS — Z85828 Personal history of other malignant neoplasm of skin: Secondary | ICD-10-CM | POA: Diagnosis not present

## 2019-08-20 DIAGNOSIS — D2239 Melanocytic nevi of other parts of face: Secondary | ICD-10-CM | POA: Diagnosis not present

## 2019-08-20 DIAGNOSIS — L814 Other melanin hyperpigmentation: Secondary | ICD-10-CM | POA: Diagnosis not present

## 2019-08-21 ENCOUNTER — Ambulatory Visit: Payer: Medicare Other | Admitting: Neurology

## 2019-08-29 ENCOUNTER — Encounter: Payer: Self-pay | Admitting: Nurse Practitioner

## 2019-08-29 ENCOUNTER — Other Ambulatory Visit: Payer: Self-pay

## 2019-08-29 ENCOUNTER — Non-Acute Institutional Stay: Payer: Medicare Other | Admitting: Nurse Practitioner

## 2019-08-29 DIAGNOSIS — I10 Essential (primary) hypertension: Secondary | ICD-10-CM | POA: Diagnosis not present

## 2019-08-29 DIAGNOSIS — E785 Hyperlipidemia, unspecified: Secondary | ICD-10-CM | POA: Diagnosis not present

## 2019-08-29 DIAGNOSIS — R269 Unspecified abnormalities of gait and mobility: Secondary | ICD-10-CM | POA: Diagnosis not present

## 2019-08-29 DIAGNOSIS — G40909 Epilepsy, unspecified, not intractable, without status epilepticus: Secondary | ICD-10-CM

## 2019-08-29 NOTE — Patient Instructions (Addendum)
F/u in clinic [facility] in 6 months, labs prior to the appointment.

## 2019-08-29 NOTE — Assessment & Plan Note (Signed)
Ambulates with walker.  

## 2019-08-29 NOTE — Progress Notes (Signed)
Location:   clinic [facility]   Place of Service:  Clinic (12) Provider: Marlana Latus NP  Code Status: DNR [place]als of [place] 04/08/2019  Does Patient Have a Medical Advance Directive? Yes  Type of Advance Directive Out of facility DNR (pink MOST or yellow form);Living will;Healthcare Power of Attorney  Does patient want to make changes to medical advance directive? No - Patient declined  Copy of [place] in Chart? Yes - validated most recent copy scanned in chart (See row information)  Pre-existing out of facility DNR order (yellow form or pink MOST form) Yellow form placed in chart (order not valid for inpatient use)     Chief Complaint  Patient presents with  . Medical Management of Chronic Issues    4 month follow up    HPI: Patient is a 78 y.o. female seen today for medical management of chronic diseases.    The patient has history of hyperlipidemia, stable, taking Atorvastatin 20mg  qd. Seizure, stable, off Zonisamide 100mg  qd about 2 wks per  f/u Neurology in [place]; a local neurologist was referred and called the patient pending a new patient appointment. The patient stated she sleeps better, bathroom trip only 1x/night, no further diarrhea.    Past Medical History:  Diagnosis Date  . Arthritis   . Cancer ([place])    skin  . Cataract 2010   Dr. Alexander Mt, DrJerlyn Ly @ [place]  . Epilepsy ([place]) 08/11/2015  . Hyperlipidemia 12/08/2015  . Hypertension   . Polyp of colon 08/11/2015    Past Surgical History:  Procedure Laterality Date  . ABDOMINAL HYSTERECTOMY  1970  . APPENDECTOMY  1958  . BREAST SURGERY  1984  . COLON SURGERY  09/12/2006  . FEMUR SURGERY  2018   Broken  . TONSILLECTOMY  09/12/1944    Allergies  Allergen Reactions  . Barbiturates Other (See Comments)    A small amount "overdosed" the patient- "affected my stability and balance" (might have been given at the same as another med?)  . Cefdinir Other (See Comments)    A  small amount "overdosed" the patient- "affected my stability and balance" (might have been given at the same as another med?)  . Phenobarbital Other (See Comments)    A small amount "overdosed" the patient- "affected my stability and balance" (might have been given at the same as another med?)  . Tegretol [Carbamazepine] Other (See Comments)    A small amount "overdosed" the patient- "affected my stability and balance" (might have been given at the same as another med?)    Allergies as of 08/29/2019      Reactions   Barbiturates Other (See Comments)   A small amount "overdosed" the patient- "affected my stability and balance" (might have been given at the same as another med?)   Cefdinir Other (See Comments)   A small amount "overdosed" the patient- "affected my stability and balance" (might have been given at the same as another med?)   Phenobarbital Other (See Comments)   A small amount "overdosed" the patient- "affected my stability and balance" (might have been given at the same as another med?)   Tegretol [carbamazepine] Other (See Comments)   A small amount "overdosed" the patient- "affected my stability and balance" (might have been given at the same as another med?)      Medication List       Accurate as of August 29, 2019 11:59 PM. If you have any questions, ask your  nurse or doctor.        STOP taking these medications   atorvastatin 20 MG tablet Commonly known as: LIPITOR Stopped by: Savalas Monje X Langley Flatley, NP   zonisamide 100 MG capsule Commonly known as: ZONEGRAN Stopped by: Zac Torti X Tannon Peerson, NP     TAKE these medications   ketoconazole 2 % shampoo Commonly known as: NIZORAL Apply topically 2 (two) times daily.   One-A-Day Womens 50 Plus Tabs Take 1 tablet by mouth daily with breakfast.   PreserVision/Lutein Caps Take 1 capsule by mouth daily with breakfast.   PreviDent 5000 Booster Plus 1.1 % Pste Generic drug: Sodium Fluoride Place 1 application onto teeth every  evening.   Systane 0.4-0.3 % Soln Generic drug: Polyethyl Glycol-Propyl Glycol Place 1-2 drops into both eyes 3 (three) times daily as needed (for dryness).   Vitamin D-3 25 MCG (1000 UT) Caps Take 1 capsule (1,000 Units total) by mouth daily with breakfast.       Review of Systems:  Review of Systems  Constitutional: Negative for activity change, appetite change, chills, diaphoresis, fatigue and fever.  HENT: Positive for hearing loss. Negative for congestion and voice change.   Respiratory: Negative for cough, shortness of breath and wheezing.   Cardiovascular: Positive for leg swelling. Negative for chest pain and palpitations.  Gastrointestinal: Negative for abdominal distention, abdominal pain, constipation, diarrhea, nausea and vomiting.  Genitourinary: Negative for difficulty urinating, dysuria and urgency.  Musculoskeletal: Positive for arthralgias and gait problem.  Skin: Negative for color change and pallor.  Neurological: Negative for dizziness, seizures, speech difficulty, weakness and headaches.  Psychiatric/Behavioral: Negative for agitation, behavioral problems, hallucinations and sleep disturbance. The patient is not nervous/anxious.     Health Maintenance  Topic Date Due  . TETANUS/TDAP  12/21/2025  . INFLUENZA VACCINE  Completed  . DEXA SCAN  Completed  . PNA vac Low Risk Adult  Completed    Physical Exam: Vitals:   08/29/19 1401  BP: (!) 136/94  Pulse: 100  Temp: (!) 97.1 F (36.2 C)  SpO2: 97%  Weight: 197 lb 12.8 oz (89.7 kg)  Height: 5\' 3"  (1.6 m)   Body mass index is 35.04 kg/m. Physical Exam Vitals and nursing note reviewed.  Constitutional:      General: She is not in acute distress.    Appearance: Normal appearance. She is obese. She is not ill-appearing, toxic-appearing or diaphoretic.  HENT:     Head: Normocephalic and atraumatic.     Nose: Nose normal.     Mouth/Throat:     Mouth: Mucous membranes are moist.  Eyes:     Extraocular  Movements: Extraocular movements intact.     Conjunctiva/sclera: Conjunctivae normal.     Pupils: Pupils are equal, round, and reactive to light.  Cardiovascular:     Rate and Rhythm: Normal rate and regular rhythm.     Heart sounds: No murmur.  Pulmonary:     Breath sounds: No wheezing, rhonchi or rales.  Abdominal:     General: Bowel sounds are normal. There is no distension.     Palpations: Abdomen is soft.     Tenderness: There is no abdominal tenderness. There is no right CVA tenderness, left CVA tenderness, guarding or rebound.  Musculoskeletal:     Cervical back: Normal range of motion and neck supple.     Right lower leg: Edema present.     Left lower leg: Edema present.     Comments: Trace edema BLE  Skin:  General: Skin is warm and dry.  Neurological:     General: No focal deficit present.     Mental Status: She is alert and oriented to person, place, and time. Mental status is at baseline.     Motor: No weakness.     Coordination: Coordination normal.     Gait: Gait abnormal.  Psychiatric:        Mood and Affect: Mood normal.        Behavior: Behavior normal.        Thought Content: Thought content normal.        Judgment: Judgment normal.     Labs reviewed: Basic Metabolic Panel: Recent Labs    10/19/18 0000 03/26/19 1056 03/26/19 1510 03/27/19 0422  NA 141 139  --  139  K 4.3 3.5  --  3.8  CL 106 105  --  106  CO2 30 26  --  23  GLUCOSE  --  96  --  101*  BUN 13 17  --  14  CREATININE 0.9 0.99  --  0.87  CALCIUM 9.0 9.4  --  9.2  TSH 4.52  --  1.601  --    Liver Function Tests: Recent Labs    10/05/18 0000 10/19/18 0000 03/26/19 1056  AST 18 13 20   ALT 19 15 25   ALKPHOS 96 101 113  BILITOT  --   --  0.4  PROT 6.8 6.5 6.9  ALBUMIN 4.0 3.7 3.5   No results for input(s): LIPASE, AMYLASE in the last 8760 hours. No results for input(s): AMMONIA in the last 8760 hours. CBC: Recent Labs    10/19/18 0000 03/26/19 1056 03/27/19 0422  WBC  8.6 17.4* 14.1*  HGB 13.0 13.2 12.5  HCT 40 42.6 39.8  MCV  --  91.8 90.0  PLT 261 272 263   Lipid Panel: Recent Labs    10/19/18 0000 12/24/18 0000  CHOL 215* 172  HDL 48 49*  LDLCALC 141 104*  TRIG 134 99  CHOLHDL  --  3.5   Lab Results  Component Value Date   HGBA1C 6.2 04/16/2016    Procedures since last visit: No results found.  Assessment/Plan  Seizure disorder ([place]) Off Zonisamide, pending a neurology new patient's appointment. Observe.   Hyperlipidemia Stable, LDL 104 12/2018, continue Atorvastatin.   Gait abnormality Ambulates with walker.   Essential hypertension Bp 136/94, isolated episode, the patient denied headache, dizziness, change of vision, chest pain/pressure, or palpitation. Will observe.    Labs/tests ordered: CBC/diff, CMP/eGR, lipid panel prior to the next appointment.   Next appt:  6 months.

## 2019-08-29 NOTE — Assessment & Plan Note (Signed)
Off Zonisamide, pending a neurology new patient's appointment. Observe.

## 2019-08-29 NOTE — Assessment & Plan Note (Signed)
Stable, LDL 104 12/2018, continue Atorvastatin.

## 2019-08-30 ENCOUNTER — Encounter: Payer: Self-pay | Admitting: Nurse Practitioner

## 2019-08-30 NOTE — Assessment & Plan Note (Signed)
Bp 136/94, isolated episode, the patient denied headache, dizziness, change of vision, chest pain/pressure, or palpitation. Will observe.

## 2019-09-16 DIAGNOSIS — Z23 Encounter for immunization: Secondary | ICD-10-CM | POA: Diagnosis not present

## 2019-09-24 ENCOUNTER — Other Ambulatory Visit: Payer: Self-pay | Admitting: Nurse Practitioner

## 2019-09-27 DIAGNOSIS — B952 Enterococcus as the cause of diseases classified elsewhere: Secondary | ICD-10-CM | POA: Diagnosis not present

## 2019-09-27 DIAGNOSIS — N390 Urinary tract infection, site not specified: Secondary | ICD-10-CM | POA: Diagnosis not present

## 2019-10-07 DIAGNOSIS — H04123 Dry eye syndrome of bilateral lacrimal glands: Secondary | ICD-10-CM | POA: Diagnosis not present

## 2019-10-07 DIAGNOSIS — Z961 Presence of intraocular lens: Secondary | ICD-10-CM | POA: Diagnosis not present

## 2019-10-07 DIAGNOSIS — H353132 Nonexudative age-related macular degeneration, bilateral, intermediate dry stage: Secondary | ICD-10-CM | POA: Diagnosis not present

## 2019-10-07 DIAGNOSIS — H43811 Vitreous degeneration, right eye: Secondary | ICD-10-CM | POA: Diagnosis not present

## 2019-10-14 DIAGNOSIS — Z23 Encounter for immunization: Secondary | ICD-10-CM | POA: Diagnosis not present

## 2019-10-17 DIAGNOSIS — H35722 Serous detachment of retinal pigment epithelium, left eye: Secondary | ICD-10-CM | POA: Diagnosis not present

## 2019-10-17 DIAGNOSIS — H35721 Serous detachment of retinal pigment epithelium, right eye: Secondary | ICD-10-CM | POA: Diagnosis not present

## 2019-10-17 DIAGNOSIS — H353132 Nonexudative age-related macular degeneration, bilateral, intermediate dry stage: Secondary | ICD-10-CM | POA: Diagnosis not present

## 2019-11-05 ENCOUNTER — Emergency Department (HOSPITAL_COMMUNITY)
Admission: EM | Admit: 2019-11-05 | Discharge: 2019-11-05 | Disposition: A | Discharge status: Home / Self Care | Payer: Medicare Other | Attending: Emergency Medicine | Admitting: Emergency Medicine

## 2019-11-05 ENCOUNTER — Encounter (HOSPITAL_COMMUNITY): Payer: Self-pay | Admitting: Emergency Medicine

## 2019-11-05 ENCOUNTER — Other Ambulatory Visit: Payer: Self-pay

## 2019-11-05 ENCOUNTER — Emergency Department (HOSPITAL_COMMUNITY): Payer: Medicare Other

## 2019-11-05 DIAGNOSIS — R569 Unspecified convulsions: Secondary | ICD-10-CM

## 2019-11-05 DIAGNOSIS — Z79899 Other long term (current) drug therapy: Secondary | ICD-10-CM | POA: Diagnosis not present

## 2019-11-05 DIAGNOSIS — R410 Disorientation, unspecified: Secondary | ICD-10-CM | POA: Diagnosis not present

## 2019-11-05 DIAGNOSIS — I10 Essential (primary) hypertension: Secondary | ICD-10-CM | POA: Diagnosis not present

## 2019-11-05 DIAGNOSIS — G40909 Epilepsy, unspecified, not intractable, without status epilepticus: Secondary | ICD-10-CM | POA: Diagnosis not present

## 2019-11-05 DIAGNOSIS — R000 Tachycardia, unspecified: Secondary | ICD-10-CM | POA: Diagnosis not present

## 2019-11-05 DIAGNOSIS — R404 Transient alteration of awareness: Secondary | ICD-10-CM | POA: Diagnosis not present

## 2019-11-05 DIAGNOSIS — Z7401 Bed confinement status: Secondary | ICD-10-CM | POA: Diagnosis not present

## 2019-11-05 DIAGNOSIS — R5381 Other malaise: Secondary | ICD-10-CM | POA: Diagnosis not present

## 2019-11-05 DIAGNOSIS — M2550 Pain in unspecified joint: Secondary | ICD-10-CM | POA: Diagnosis not present

## 2019-11-05 DIAGNOSIS — G9340 Encephalopathy, unspecified: Secondary | ICD-10-CM | POA: Diagnosis not present

## 2019-11-05 LAB — BASIC METABOLIC PANEL
Anion gap: 10 (ref 5–15)
BUN: 22 mg/dL (ref 8–23)
CO2: 28 mmol/L (ref 22–32)
Calcium: 9.4 mg/dL (ref 8.9–10.3)
Chloride: 103 mmol/L (ref 98–111)
Creatinine, Ser: 0.97 mg/dL (ref 0.44–1.00)
GFR calc Af Amer: >60 mL/min (ref >60)
GFR calc non Af Amer: 55 mL/min — ABNORMAL LOW (ref >60)
Glucose, Bld: 120 mg/dL — ABNORMAL HIGH (ref 70–99)
Potassium: 3.8 mmol/L (ref 3.5–5.1)
Sodium: 141 mmol/L (ref 135–145)

## 2019-11-05 LAB — CBC WITH DIFFERENTIAL/PLATELET
Abs Immature Granulocytes: 0.04 10*3/uL (ref 0.00–0.07)
Basophils Absolute: 0.1 10*3/uL (ref 0.0–0.1)
Basophils Relative: 1 %
Eosinophils Absolute: 0 10*3/uL (ref 0.0–0.5)
Eosinophils Relative: 0 %
HCT: 42 % (ref 36.0–46.0)
Hemoglobin: 12.8 g/dL (ref 12.0–15.0)
Immature Granulocytes: 0 %
Lymphocytes Relative: 12 %
Lymphs Abs: 1.4 10*3/uL (ref 0.7–4.0)
MCH: 27.6 pg (ref 26.0–34.0)
MCHC: 30.5 g/dL (ref 30.0–36.0)
MCV: 90.5 fL (ref 80.0–100.0)
Monocytes Absolute: 0.7 10*3/uL (ref 0.1–1.0)
Monocytes Relative: 6 %
Neutro Abs: 9.9 10*3/uL — ABNORMAL HIGH (ref 1.7–7.7)
Neutrophils Relative %: 81 %
Platelets: 255 10*3/uL (ref 150–400)
RBC: 4.64 MIL/uL (ref 3.87–5.11)
RDW: 14.7 % (ref 11.5–15.5)
WBC: 12.2 10*3/uL — ABNORMAL HIGH (ref 4.0–10.5)
nRBC: 0 % (ref 0.0–0.2)

## 2019-11-05 LAB — CBG MONITORING, ED: Glucose-Capillary: 118 mg/dL — ABNORMAL HIGH (ref 70–99)

## 2019-11-05 MED ORDER — DIVALPROEX SODIUM 500 MG PO DR TAB
1500.0000 mg | DELAYED_RELEASE_TABLET | Freq: Two times a day (BID) | ORAL | Status: DC
  Administered 2019-11-05: 1500 mg via ORAL
  Filled 2019-11-05: qty 3

## 2019-11-05 MED ORDER — DIVALPROEX SODIUM 500 MG PO DR TAB: 500.0000 mg | DELAYED_RELEASE_TABLET | Freq: Two times a day (BID) | ORAL | 1 refills | Status: DC

## 2019-11-05 MED ORDER — LEVETIRACETAM IN NACL 1000 MG/100ML IV SOLN: 1000.0000 mg | Freq: Once | INTRAVENOUS | Status: DC

## 2019-11-05 NOTE — ED Notes (Signed)
Patient given PO food and fluids, per MD.

## 2019-11-05 NOTE — Discharge Instructions (Signed)
You were evaluated in the Emergency Department and after careful evaluation, we did not find any emergent condition requiring admission or further testing in the hospital.  Your exam/testing today was overall reassuring.  Please take the Depakote medication as directed and follow-up with neurology.  Please return to the Emergency Department if you experience any worsening of your condition.  We encourage you to follow up with a primary care provider.  Thank you for allowing [place] to be a part of your care.

## 2019-11-05 NOTE — ED Provider Notes (Signed)
[facility] DEPT Provider Note   CSN: [id number] Arrival date & time: 11/05/19  1202     History Chief Complaint  Patient presents with  . Seizures    Katherine Obrien is a 79 y.o. female.  79 yo F with a chief complaints of a seizure.  Patient has a history of seizures and was recently taken off of her seizure medication because of side effects.  Was not started on a new medication.  Patient had one seizure today now back to her baseline.  She denies any injury denies headache neck pain chest pain shortness of breath nausea vomiting diarrhea denies cough or congestion.  The history is provided by the patient.  Seizures Seizure activity on arrival: no   Seizure type:  Grand mal Initial focality:  None Episode characteristics: abnormal movements and generalized shaking   Postictal symptoms: confusion   Return to baseline: yes   Severity:  Mild Duration:  2 minutes Timing:  Once Number of seizures this episode:  1 Progression:  Resolved Context: change in medication   Recent head injury:  No recent head injuries PTA treatment:  None History of seizures: yes        Past Medical History:  Diagnosis Date  . Arthritis   . Cancer ([place])    skin  . Cataract 2010   Dr. Alexander Mt, DrJerlyn Ly @ [facility]  . Epilepsy ([place]) 08/11/2015  . Hyperlipidemia 12/08/2015  . Hypertension   . Polyp of colon 08/11/2015    Patient Active Problem List   Diagnosis Date Noted  . Loss of consciousness ([place]) 03/26/2019  . Essential hypertension 03/26/2019  . Actinic keratoses 12/20/2018  . Elevated systolic blood pressure reading without diagnosis of hypertension 11/16/2018  . UTI (urinary tract infection) 10/08/2018  . Adynamic ileus ([place]) 10/08/2018  . Diarrhea 10/05/2018  . GERD (gastroesophageal reflux disease) 10/05/2018  . Urinary hesitancy 10/05/2018  . Advanced care planning/counseling discussion 10/05/2018  . Hyperlipidemia 09/19/2018  . Gait  abnormality 09/17/2018  . Macular degeneration 09/17/2018  . Dry eyes 09/17/2018  . History of diverticulitis 09/17/2018  . Seizure disorder ([place]) 09/17/2018  . Hx of vertebral fracture repair 09/17/2018  . Obese 09/17/2018    Past Surgical History:  Procedure Laterality Date  . ABDOMINAL HYSTERECTOMY  1970  . APPENDECTOMY  1958  . BREAST SURGERY  1984  . COLON SURGERY  09/12/2006  . FEMUR SURGERY  2018   Broken  . TONSILLECTOMY  09/12/1944     OB History   No obstetric history on file.     Family History  Problem Relation Age of Onset  . Osteoporosis Mother   . Dementia Mother   . Arthritis Mother   . Stroke Father 65       mini  . Dementia Father   . CVA Father   . Cancer Sister        breast  . Dementia Sister        severe  . Alzheimer's disease Sister   . Diabetes Sister   . Hypertension Sister   . Stroke Paternal Grandfather        61    Social History   Tobacco Use  . Smoking status: Never Smoker  . Smokeless tobacco: Never Used  Substance Use Topics  . Alcohol use: Never  . Drug use: Never    Home Medications Prior to Admission medications   Medication Sig Start Date End Date Taking? Authorizing Provider  Cholecalciferol (VITAMIN D-3)  25 MCG (1000 UT) CAPS TAKE 1 CAPSULE DAILY WITH BREAKFAST. 09/24/19  Yes Mast, Man X, NP  ketoconazole (NIZORAL) 2 % shampoo Apply 1 application topically once a week.  04/23/19  Yes [provider]  Multiple Vitamins-Minerals (ONE-A-DAY WOMENS 50 PLUS) TABS Take 1 tablet by mouth daily with breakfast.   Yes [provider]  Multiple Vitamins-Minerals (PRESERVISION/LUTEIN) CAPS Take 1 capsule by mouth daily with breakfast.   Yes [provider]  Polyethyl Glycol-Propyl Glycol (SYSTANE) 0.4-0.3 % SOLN Place 1-2 drops into both eyes 3 (three) times daily as needed (for dryness).    Yes [provider]  divalproex (DEPAKOTE) 500 MG DR tablet Take 1 tablet (500 mg total) by mouth 2  (two) times daily. 11/05/19   Maudie Flakes, MD  Sodium Fluoride (PREVIDENT 5000 BOOSTER PLUS) 1.1 % PSTE Place 1 application onto teeth every evening.     [provider]    Allergies    Barbiturates, Cefdinir, Phenobarbital, Tegretol [carbamazepine], and Zonisamide  Review of Systems   Review of Systems  Constitutional: Negative for chills and fever.  HENT: Negative for congestion and rhinorrhea.   Eyes: Negative for redness and visual disturbance.  Respiratory: Negative for shortness of breath and wheezing.   Cardiovascular: Negative for chest pain and palpitations.  Gastrointestinal: Negative for nausea and vomiting.  Genitourinary: Negative for dysuria and urgency.  Musculoskeletal: Negative for arthralgias and myalgias.  Skin: Negative for pallor and wound.  Neurological: Positive for seizures. Negative for dizziness and headaches.    Physical Exam Updated Vital Signs BP 134/73 (BP Location: Right Arm)   Pulse 80   Temp 98.4 F (36.9 C) (Oral)   Resp 16   Ht 5\' 3"  (1.6 m)   Wt 81.6 kg   SpO2 98%   BMI 31.89 kg/m   Physical Exam Vitals and nursing note reviewed.  Constitutional:      General: She is not in acute distress.    Appearance: She is well-developed. She is not diaphoretic.  HENT:     Head: Normocephalic and atraumatic.  Eyes:     Pupils: Pupils are equal, round, and reactive to light.  Cardiovascular:     Rate and Rhythm: Normal rate and regular rhythm.     Heart sounds: No murmur. No friction rub. No gallop.   Pulmonary:     Effort: Pulmonary effort is normal.     Breath sounds: No wheezing or rales.  Abdominal:     General: There is no distension.     Palpations: Abdomen is soft.     Tenderness: There is no abdominal tenderness.  Musculoskeletal:        General: No tenderness.     Cervical back: Normal range of motion and neck supple.     Comments: Large legs bilaterally without obvious edema  Skin:    General: Skin is warm and dry.   Neurological:     Mental Status: She is alert and oriented to person, place, and time.  Psychiatric:        Behavior: Behavior normal.     ED Results / Procedures / Treatments   Labs (all labs ordered are listed, but only abnormal results are displayed) Labs Reviewed  CBC WITH DIFFERENTIAL/PLATELET - Abnormal; Notable for the following components:      Result Value   WBC 12.2 (*)    Neutro Abs 9.9 (*)    All other components within normal limits  BASIC METABOLIC PANEL - Abnormal; Notable for  the following components:   Glucose, Bld 120 (*)    GFR calc non Af Amer 55 (*)    All other components within normal limits  CBG MONITORING, ED - Abnormal; Notable for the following components:   Glucose-Capillary 118 (*)    All other components within normal limits    EKG EKG Interpretation  Date/Time:  Tuesday November 05 2019 12:17:03 EST Ventricular Rate:  96 PR Interval:    QRS Duration: 92 QT Interval:  353 QTC Calculation: 447 R Axis:   52 Text Interpretation: Sinus rhythm Borderline prolonged PR interval RSR' in V1 or V2, right VCD or RVH Since last tracing rate faster Otherwise no significant change Confirmed by Deno Etienne [phone]) on 11/05/2019 12:29:50 PM   Radiology CT Head Wo Contrast  Result Date: 11/05/2019 CLINICAL DATA:  Encephalopathy. EXAM: CT HEAD WITHOUT CONTRAST TECHNIQUE: Contiguous axial images were obtained from the base of the skull through the vertex without intravenous contrast. COMPARISON:  CT 03/26/2019 FINDINGS: Brain: Mild atrophy and chronic microvascular ischemic change in the white matter. Negative for acute infarct, hemorrhage, mass Vascular: Negative for hyperdense vessel Skull: Negative Sinuses/Orbits: Paranasal sinuses show mild mucosal edema. Bilateral cataract extraction. Other: None IMPRESSION: No acute abnormality. Atrophy and mild chronic microvascular ischemia. Electronically Signed   By: Franchot Gallo M.D.   On: 11/05/2019 17:49     Procedures Procedures (including critical care time)  Medications Ordered in ED Medications - No data to display  ED Course  I have reviewed the triage vital signs and the nursing notes.  Pertinent labs & imaging results that were available during my care of the patient were reviewed by me and considered in my medical decision making (see chart for details).    MDM Rules/Calculators/A&P                      79 yo F with a chief complaint of seizure.  This occurred just prior to arrival.  Patient has a history of seizures and was recently taken off her antiepileptic medicines due to side effects.    Will discuss with neuro. Neuro recommends CT head and basic labwork.  Will have me load on oral depatoke 1500mg  and start her on 500mg  bid.  Neuro follow up.   The patients results and plan were reviewed and discussed.   Any x-rays performed were independently reviewed by myself.   Differential diagnosis were considered with the presenting HPI.  Medications - No data to display  Vitals:   11/05/19 1600 11/05/19 1630 11/05/19 1700 11/05/19 2041  BP: (!) 163/75 (!) 164/87 (!) 173/77 134/73  Pulse: 89  88 80  Resp: 16 17 14 16   Temp:      TempSrc:      SpO2: 97%  96% 98%  Weight:      Height:        Final diagnoses:  Seizure ([place])    Admission/ observation were discussed with the admitting physician, patient and/or family and they are comfortable with the plan.   Final Clinical Impression(s) / ED Diagnoses Final diagnoses:  Seizure ([place])    Rx / DC Orders ED Discharge Orders         Ordered    divalproex (DEPAKOTE) 500 MG DR tablet  2 times daily     11/05/19 1814    Ambulatory referral to Neurology    Comments: An appointment is requested in approximately: 4 weeks   11/05/19 1815  Deno Etienne, DO 11/06/19 [phone]

## 2019-11-05 NOTE — ED Triage Notes (Signed)
Arrives via EMS from [facility], C/C seizure, patient was playing corn hole and had about a 30 second grand mal seizure witnessed by staff, patient had seizure in a chair, did not hit her head, confusion with EMS then about 10 min later A&O x4, ambulatory with walker. CBG 142. 18 G L hand.

## 2019-11-05 NOTE — ED Notes (Signed)
[facility] called for transport back to [facility]. [facility] called and updated on patient's discharge status.

## 2019-11-05 NOTE — ED Notes (Signed)
Patient stated that she did not want 1500 mg of Depakote, she would only take the 500 mg. MD made aware. Patient educated on the fact that it is a loading dose, patient still only wants to take 500 mg.

## 2019-11-05 NOTE — ED Provider Notes (Signed)
  Provider Note MRN:  [id number]  Arrival date & time: 11/05/19    ED Course and Medical Decision Making  Assumed care from Dr. Tyrone Nine at shift change.  Seizure disorder, recently stopped taking seizure medication, seizure today, awaiting CT head, neurology has been consulted, they are recommending Depakote 500 mg twice daily, anticipating discharge.  8:15 PM update: CT head is normal, patient is appropriate for discharge.  Procedures  Final Clinical Impressions(s) / ED Diagnoses     ICD-10-CM   1. Seizure ([place])  R56.9     ED Discharge Orders         Ordered    divalproex (DEPAKOTE) 500 MG DR tablet  2 times daily     11/05/19 1814    Ambulatory referral to Neurology    Comments: An appointment is requested in approximately: 4 weeks   11/05/19 1815            Discharge Instructions     You were evaluated in the Emergency Department and after careful evaluation, we did not find any emergent condition requiring admission or further testing in the hospital.  Your exam/testing today was overall reassuring.  Please take the Depakote medication as directed and follow-up with neurology.  Please return to the Emergency Department if you experience any worsening of your condition.  We encourage you to follow up with a primary care provider.  Thank you for allowing [place] to be a part of your care.      Barth Kirks. Sedonia Small, [place] [email or web address]    Maudie Flakes, MD 11/05/19 [phone]

## 2019-11-13 ENCOUNTER — Ambulatory Visit: Payer: Medicare Other | Admitting: Neurology

## 2019-11-19 DIAGNOSIS — D2239 Melanocytic nevi of other parts of face: Secondary | ICD-10-CM | POA: Diagnosis not present

## 2019-11-19 DIAGNOSIS — L821 Other seborrheic keratosis: Secondary | ICD-10-CM | POA: Diagnosis not present

## 2019-11-19 DIAGNOSIS — L570 Actinic keratosis: Secondary | ICD-10-CM | POA: Diagnosis not present

## 2019-11-19 DIAGNOSIS — L218 Other seborrheic dermatitis: Secondary | ICD-10-CM | POA: Diagnosis not present

## 2019-11-19 DIAGNOSIS — L814 Other melanin hyperpigmentation: Secondary | ICD-10-CM | POA: Diagnosis not present

## 2019-11-19 DIAGNOSIS — D1801 Hemangioma of skin and subcutaneous tissue: Secondary | ICD-10-CM | POA: Diagnosis not present

## 2019-12-05 ENCOUNTER — Ambulatory Visit: Payer: Medicare Other | Admitting: Neurology

## 2019-12-24 ENCOUNTER — Ambulatory Visit: Payer: Medicare Other | Admitting: Neurology

## 2020-01-01 DIAGNOSIS — H353132 Nonexudative age-related macular degeneration, bilateral, intermediate dry stage: Secondary | ICD-10-CM | POA: Diagnosis not present

## 2020-01-01 DIAGNOSIS — Z961 Presence of intraocular lens: Secondary | ICD-10-CM | POA: Diagnosis not present

## 2020-01-01 DIAGNOSIS — H04123 Dry eye syndrome of bilateral lacrimal glands: Secondary | ICD-10-CM | POA: Diagnosis not present

## 2020-01-01 DIAGNOSIS — H43811 Vitreous degeneration, right eye: Secondary | ICD-10-CM | POA: Diagnosis not present

## 2020-01-13 DIAGNOSIS — H35722 Serous detachment of retinal pigment epithelium, left eye: Secondary | ICD-10-CM | POA: Insufficient documentation

## 2020-01-13 DIAGNOSIS — H353113 Nonexudative age-related macular degeneration, right eye, advanced atrophic without subfoveal involvement: Secondary | ICD-10-CM | POA: Insufficient documentation

## 2020-01-13 DIAGNOSIS — H353132 Nonexudative age-related macular degeneration, bilateral, intermediate dry stage: Secondary | ICD-10-CM | POA: Insufficient documentation

## 2020-01-13 DIAGNOSIS — Z961 Presence of intraocular lens: Secondary | ICD-10-CM | POA: Insufficient documentation

## 2020-01-13 DIAGNOSIS — H353112 Nonexudative age-related macular degeneration, right eye, intermediate dry stage: Secondary | ICD-10-CM | POA: Insufficient documentation

## 2020-01-13 DIAGNOSIS — H35721 Serous detachment of retinal pigment epithelium, right eye: Secondary | ICD-10-CM | POA: Insufficient documentation

## 2020-01-14 ENCOUNTER — Ambulatory Visit (INDEPENDENT_AMBULATORY_CARE_PROVIDER_SITE_OTHER): Payer: Medicare Other | Admitting: Ophthalmology

## 2020-01-14 ENCOUNTER — Other Ambulatory Visit: Payer: Self-pay

## 2020-01-14 ENCOUNTER — Encounter (INDEPENDENT_AMBULATORY_CARE_PROVIDER_SITE_OTHER): Payer: Self-pay | Admitting: Ophthalmology

## 2020-01-14 DIAGNOSIS — Z961 Presence of intraocular lens: Secondary | ICD-10-CM

## 2020-01-14 DIAGNOSIS — H353132 Nonexudative age-related macular degeneration, bilateral, intermediate dry stage: Secondary | ICD-10-CM | POA: Diagnosis not present

## 2020-01-14 DIAGNOSIS — H35722 Serous detachment of retinal pigment epithelium, left eye: Secondary | ICD-10-CM | POA: Diagnosis not present

## 2020-01-14 DIAGNOSIS — H35721 Serous detachment of retinal pigment epithelium, right eye: Secondary | ICD-10-CM

## 2020-01-14 NOTE — Progress Notes (Signed)
01/14/2020     CHIEF COMPLAINT Patient presents for Retina Follow Up   HISTORY OF PRESENT ILLNESS: Katherine Obrien is a 79 y.o. female who presents to the clinic today for:   HPI    Retina Follow Up    Patient presents with  Dry AMD.  In both eyes.  Severity is moderate.  Duration of 3 months.  Since onset it is stable.  I, the attending physician,  performed the HPI with the patient and updated documentation appropriately.          Comments    3 Month AMD f\u OU. OCT  Pt states vision is stable. Pt saw Dr. Katy Fitch about 10 days ago. Denies FOL and floaters.       Last edited by Tilda Franco on 01/14/2020  1:02 PM. (History)      Referring physician: Mast, Man X, NP [address],  [place] [id number]  HISTORICAL INFORMATION:   Selected notes from the MEDICAL RECORD NUMBER    Lab Results  Component Value Date   HGBA1C 6.2 04/16/2016     CURRENT MEDICATIONS: Current Outpatient Medications (Ophthalmic Drugs)  Medication Sig  . Polyethyl Glycol-Propyl Glycol (SYSTANE) 0.4-0.3 % SOLN Place 1-2 drops into both eyes 3 (three) times daily as needed (for dryness).    No current facility-administered medications for this visit. (Ophthalmic Drugs)   Current Outpatient Medications (Other)  Medication Sig  . Cholecalciferol (VITAMIN D-3) 25 MCG (1000 UT) CAPS TAKE 1 CAPSULE DAILY WITH BREAKFAST.  Marland Kitchen divalproex (DEPAKOTE) 500 MG DR tablet Take 1 tablet (500 mg total) by mouth 2 (two) times daily.  Marland Kitchen ketoconazole (NIZORAL) 2 % shampoo Apply 1 application topically once a week.   . Multiple Vitamins-Minerals (ONE-A-DAY WOMENS 50 PLUS) TABS Take 1 tablet by mouth daily with breakfast.  . Multiple Vitamins-Minerals (PRESERVISION/LUTEIN) CAPS Take 1 capsule by mouth daily with breakfast.  . Sodium Fluoride (PREVIDENT 5000 BOOSTER PLUS) 1.1 % PSTE Place 1 application onto teeth every evening.    No current facility-administered medications for this visit. (Other)       REVIEW OF SYSTEMS:    ALLERGIES Allergies  Allergen Reactions  . Barbiturates Other (See Comments)    A small amount "overdosed" the patient- "affected my stability and balance" (might have been given at the same as another med?)  . Cefdinir Other (See Comments)    A small amount "overdosed" the patient- "affected my stability and balance" (might have been given at the same as another med?)  . Phenobarbital Other (See Comments)    A small amount "overdosed" the patient- "affected my stability and balance" (might have been given at the same as another med?)  . Tegretol [Carbamazepine] Other (See Comments)    A small amount "overdosed" the patient- "affected my stability and balance" (might have been given at the same as another med?)  . Zonisamide Other (See Comments)    Negative reaction    PAST MEDICAL HISTORY Past Medical History:  Diagnosis Date  . Arthritis   . Cancer ([place])    skin  . Cataract 2010   Dr. Alexander Mt, DrJerlyn Ly @ [place]  . Epilepsy ([place]) 08/11/2015  . Hyperlipidemia 12/08/2015  . Hypertension   . Polyp of colon 08/11/2015   Past Surgical History:  Procedure Laterality Date  . ABDOMINAL HYSTERECTOMY  1970  . APPENDECTOMY  1958  . BREAST SURGERY  1984  . COLON SURGERY  09/12/2006  . FEMUR SURGERY  2018  Broken  . TONSILLECTOMY  09/12/1944    FAMILY HISTORY Family History  Problem Relation Age of Onset  . Osteoporosis Mother   . Dementia Mother   . Arthritis Mother   . Stroke Father 102       mini  . Dementia Father   . CVA Father   . Cancer Sister        breast  . Dementia Sister        severe  . Alzheimer's disease Sister   . Diabetes Sister   . Hypertension Sister   . Stroke Paternal Grandfather        1970    SOCIAL HISTORY Social History   Tobacco Use  . Smoking status: Never Smoker  . Smokeless tobacco: Never Used  Substance Use Topics  . Alcohol use: Never  . Drug use: Never         OPHTHALMIC EXAM:  Base Eye  Exam    Visual Acuity (Snellen - Linear)      Right Left   Dist cc 20/50 20/50   Dist ph cc NI NI       Tonometry (Tonopen, 1:08 PM)      Right Left   Pressure 12 10       Pupils      Pupils Dark Light Shape React APD   Right PERRL 6 4 Round Brisk None   Left PERRL 6 4 Round Brisk None       Visual Fields (Counting fingers)      Left Right    Full Full       Neuro/Psych    Oriented x3: Yes   Mood/Affect: Normal       Dilation    Both eyes: 1.0% Mydriacyl, 2.5% Phenylephrine @ 1:08 PM        Slit Lamp and Fundus Exam    External Exam      Right Left   External Normal Normal       Slit Lamp Exam      Right Left   Lens Posterior chamber intraocular lens Posterior chamber intraocular lens       Fundus Exam      Right Left   Posterior Vitreous Posterior vitreous detachment Posterior vitreous detachment   Disc Normal Normal   C/D Ratio 0.55 0.6   Macula Retinal pigment epithelial mottling, Hard drusen, Retinal pigment epithelial atrophy, Early age related macular degeneration large drusenoid subfoveal PED., no hemorrhage, no exudates Retinal pigment epithelial mottling, Hard drusen, Retinal pigment epithelial atrophy, Early age related macular degeneration large drusenoid subfoveal PED., no hemorrhage, no exudates   Vessels Normal Normal   Periphery Normal Normal          IMAGING AND PROCEDURES  Imaging and Procedures for 01/14/20  OCT, Retina - OU - Both Eyes       Right Eye Quality was good. Scan locations included subfoveal. Central Foveal Thickness: 312. Progression has been stable. Findings include retinal drusen , abnormal foveal contour, pigment epithelial detachment.   Left Eye Quality was good. Scan locations included subfoveal. Central Foveal Thickness: 315. Progression has been stable. Findings include abnormal foveal contour, retinal drusen , pigment epithelial detachment, no IRF, no SRF.   Notes OD overall stable.  OS overall stable  watch the cut of the macular region 1 cut superior to the foveal avascular zone which appears to have intrusion of hyper reflective material  No active CNVM  ASSESSMENT/PLAN:  Intermediate stage nonexudative age-related macular degeneration of both eyes The nature of age--related macular degeneration was discussed with the patient as well as the distinction between dry and wet types. Checking an Amsler Grid daily with advice to return immediately should a distortion develop, was given to the patient. The patient 's smoking status now and in the past was determined and advice based on the AREDS study was provided regarding the consumption of antioxidant supplements. AREDS 2 vitamin formulation was recommended. Consumption of dark leafy vegetables and fresh fruits of various colors was recommended. Treatment modalities for wet macular degeneration particularly the use of intravitreal injections of anti-blood vessel growth factors was discussed with the patient. Avastin, Lucentis, and Eylea are the available options. On occasion, therapy includes the use of photodynamic therapy and thermal laser. Stressed to the patient do not rub eyes. All patient questions were answered.      ICD-10-CM   1. Intermediate stage nonexudative age-related macular degeneration of both eyes  H35.3132 OCT, Retina - OU - Both Eyes  2. Serous detachment of retinal pigment epithelium of right eye  H35.721   3. Serous detachment of retinal pigment epithelium of left eye  H35.722   4. Pseudophakia, both eyes  Z96.1     1.  No active retinal treatment intervention required at this time.  2.  3.  Ophthalmic Meds Ordered this visit:  No orders of the defined types were placed in this encounter.      Return in about 4 months (around 05/16/2020) for DILATE OU, OCT.  There are no Patient Instructions on file for this visit.   Explained the diagnoses, plan, and follow up with the patient and they  expressed understanding.  Patient expressed understanding of the importance of proper follow up care.   Clent Demark Malikah Principato M.D. Diseases & Surgery of the Retina and Vitreous Retina & Diabetic [place] 01/14/20     Abbreviations: M myopia (nearsighted); A astigmatism; H hyperopia (farsighted); P presbyopia; Mrx spectacle prescription;  CTL contact lenses; OD right eye; OS left eye; OU both eyes  XT exotropia; ET esotropia; PEK punctate epithelial keratitis; PEE punctate epithelial erosions; DES dry eye syndrome; MGD meibomian gland dysfunction; ATs artificial tears; PFAT's preservative free artificial tears; [place] nuclear sclerotic cataract; PSC posterior subcapsular cataract; ERM epi-retinal membrane; PVD posterior vitreous detachment; RD retinal detachment; DM diabetes mellitus; DR diabetic retinopathy; NPDR non-proliferative diabetic retinopathy; PDR proliferative diabetic retinopathy; CSME clinically significant macular edema; DME diabetic macular edema; dbh dot blot hemorrhages; CWS cotton wool spot; POAG primary open angle glaucoma; C/D cup-to-disc ratio; HVF humphrey visual field; GVF goldmann visual field; OCT optical coherence tomography; IOP intraocular pressure; BRVO Branch retinal vein occlusion; CRVO central retinal vein occlusion; CRAO central retinal artery occlusion; BRAO branch retinal artery occlusion; RT retinal tear; SB scleral buckle; PPV pars plana vitrectomy; VH Vitreous hemorrhage; PRP panretinal laser photocoagulation; IVK intravitreal kenalog; VMT vitreomacular traction; MH Macular hole;  NVD neovascularization of the disc; NVE neovascularization elsewhere; AREDS age related eye disease study; ARMD age related macular degeneration; POAG primary open angle glaucoma; EBMD epithelial/anterior basement membrane dystrophy; ACIOL anterior chamber intraocular lens; IOL intraocular lens; PCIOL posterior chamber intraocular lens; Phaco/IOL phacoemulsification with intraocular lens placement;  [place] photorefractive keratectomy; LASIK laser assisted in situ keratomileusis; HTN hypertension; DM diabetes mellitus; COPD chronic obstructive pulmonary disease

## 2020-01-14 NOTE — Patient Instructions (Signed)
Age-Related Macular Degeneration  Age-related macular degeneration (AMD) is an eye disease related to aging. The disease causes a loss of central vision. Central vision allows a person to see objects clearly and do daily tasks like reading and driving. There are two main types of AMD:  Dry AMD. People with this type generally lose their vision slowly. This is the most common type of AMD. Some people with dry AMD notice very little change in their vision as they age.  Wet AMD. People with this type can lose their vision quickly. What are the causes? This condition is caused by damage to the part of the eye that provides you with central vision (macula).  Dry AMD happens when deposits in the macula cause light-sensitive cells to slowly break down.  Wet AMD happens when abnormal blood vessels grow under the macula and leak blood and fluid. What increases the risk? You are more likely to develop this condition if you:  Are 50 years old or older, and especially 75 years old or older.  Smoke.  Are obese.  Have a family history of AMD.  Have high cholesterol, high blood pressure, or heart disease.  Have been exposed to high levels of ultraviolet (UV) light and blue light.  Are white (Caucasian).  Are female. What are the signs or symptoms? Common symptoms of this condition include:  Blurred vision, especially when reading print material. The blurred vision often improves in brighter light.  A blurred or blind spot in the center of your field of vision that is small but growing larger.  Bright colors seeming less bright than they used to be.  Decreased ability to recognize and see faces.  One eye seeing worse than the other.  Decreased ability to adapt to dimly lit rooms.  Straight lines appearing crooked or wavy. How is this diagnosed? This condition is diagnosed based on your symptoms and an eye exam. During the eye exam:  Eye drops will be placed into your eyes to  enlarge (dilate) your pupils. This will allow your health care provider to see the back of your eye.  You may be asked to look at an image that looks like a checkerboard (Amsler grid). Early changes in your central vision may cause the grid to appear distorted. After the exam, you may be given one or both of these tests:  Fluorescein angiogram. This test determines whether you have dry or wet AMD.  Optical coherence tomography (OCT) test to evaluate deep layers of the retina. How is this treated? There is no cure for this condition, but treatment can help to slow down progression of the disease. This condition may be treated with:  Supplements, including vitamin C, vitamin E, beta carotene, and zinc.  Laser surgery to destroy new blood vessels or leaking blood vessels in your eye.  Injections of medicines into your eye to slow down the formation of abnormal blood vessels that may leak. These injections may need to be repeated on a routine basis. Follow these instructions at home:  Take over-the-counter and prescription medicines only as told by your health care provider.  Take vitamins and supplements as told by your health care provider.  Ask your health care provider for an Amsler grid. Use it every day to check each eye for vision changes.  Get an eye exam as often as told by your health care provider. Make sure to get an eye exam at least once every year.  Keep all follow-up visits as told by   your health care provider. This is important. Contact a health care provider if:  You notice any new changes in your vision. Get help right away if:  You suddenly lose vision or develop pain in the eye. Summary  Age-related macular degeneration (AMD) is an eye disease related to aging. There are two types of this condition: dry AMD and wet AMD.  This condition is caused by damage to the part of the eye that provides you with central vision (macula).  Once diagnosed with AMD, make sure  to get an eye exam every year, take supplements and vitamins as directed, use an Amsler grid at home, and follow up with your health care provider. This information is not intended to replace advice given to you by your health care provider. Make sure you discuss any questions you have with your health care provider. Document Revised: 03/07/2018 Document Reviewed: 03/07/2018 Elsevier Patient Education  2020 Elsevier Inc.  

## 2020-01-14 NOTE — Assessment & Plan Note (Signed)

## 2020-01-21 DIAGNOSIS — L821 Other seborrheic keratosis: Secondary | ICD-10-CM | POA: Diagnosis not present

## 2020-01-21 DIAGNOSIS — D1801 Hemangioma of skin and subcutaneous tissue: Secondary | ICD-10-CM | POA: Diagnosis not present

## 2020-01-21 DIAGNOSIS — L814 Other melanin hyperpigmentation: Secondary | ICD-10-CM | POA: Diagnosis not present

## 2020-01-21 DIAGNOSIS — L218 Other seborrheic dermatitis: Secondary | ICD-10-CM | POA: Diagnosis not present

## 2020-01-21 DIAGNOSIS — D2239 Melanocytic nevi of other parts of face: Secondary | ICD-10-CM | POA: Diagnosis not present

## 2020-01-21 DIAGNOSIS — L570 Actinic keratosis: Secondary | ICD-10-CM | POA: Diagnosis not present

## 2020-01-21 DIAGNOSIS — L820 Inflamed seborrheic keratosis: Secondary | ICD-10-CM | POA: Diagnosis not present

## 2020-01-24 DIAGNOSIS — R351 Nocturia: Secondary | ICD-10-CM | POA: Diagnosis not present

## 2020-01-24 DIAGNOSIS — R350 Frequency of micturition: Secondary | ICD-10-CM | POA: Diagnosis not present

## 2020-02-25 ENCOUNTER — Other Ambulatory Visit: Payer: Self-pay

## 2020-02-25 DIAGNOSIS — G40909 Epilepsy, unspecified, not intractable, without status epilepticus: Secondary | ICD-10-CM

## 2020-02-25 DIAGNOSIS — E785 Hyperlipidemia, unspecified: Secondary | ICD-10-CM

## 2020-02-27 ENCOUNTER — Encounter: Payer: Self-pay | Admitting: Nurse Practitioner

## 2020-02-27 ENCOUNTER — Other Ambulatory Visit: Payer: Self-pay

## 2020-02-27 ENCOUNTER — Non-Acute Institutional Stay: Payer: Medicare Other | Admitting: Nurse Practitioner

## 2020-02-27 DIAGNOSIS — G40909 Epilepsy, unspecified, not intractable, without status epilepticus: Secondary | ICD-10-CM | POA: Diagnosis not present

## 2020-02-27 DIAGNOSIS — E669 Obesity, unspecified: Secondary | ICD-10-CM

## 2020-02-27 DIAGNOSIS — E785 Hyperlipidemia, unspecified: Secondary | ICD-10-CM | POA: Diagnosis not present

## 2020-02-27 DIAGNOSIS — I10 Essential (primary) hypertension: Secondary | ICD-10-CM

## 2020-02-27 DIAGNOSIS — Z6836 Body mass index (BMI) 36.0-36.9, adult: Secondary | ICD-10-CM

## 2020-02-27 NOTE — Patient Instructions (Addendum)
3 months with Dr. Lyndel Safe. Dietary consult for cholesterol management. Obtain Hgb a1c next week.   Will call you with next appointment date/time.

## 2020-02-27 NOTE — Progress Notes (Addendum)
Location:   clinic [place]   Place of Service:  Clinic (12) Provider: Marlana Latus NP  Code Status: DNR Goals of Care:  Advanced Directives 11/05/2019  Does Patient Have a Medical Advance Directive? No  Type of Advance Directive -  Does patient want to make changes to medical advance directive? -  Copy of [place] in Chart? -  Pre-existing out of facility DNR order (yellow form or pink MOST form) -     Chief Complaint  Patient presents with  . Medical Management of Chronic Issues    Patient returns to the clinic for follow up.     HPI: Patient is a 79 y.o. female seen today for medical management of chronic diseases.      Hx of hyperlipidemia, LDL 135 02/25/20, off Atorvastatin 20mg  qd. Seizure, stable, off Depakote 500mg  bid. Pending neurology in [place]. The patient stated she sleeps better, bathroom trip only 1x/night.   Past Medical History:  Diagnosis Date  . Arthritis   . Cancer ([place])    skin  . Cataract 2010   Dr. Alexander Mt, DrJerlyn Ly @ [facility]  . Epilepsy ([place]) 08/11/2015  . Hyperlipidemia 12/08/2015  . Hypertension   . Polyp of colon 08/11/2015    Past Surgical History:  Procedure Laterality Date  . ABDOMINAL HYSTERECTOMY  1970  . APPENDECTOMY  1958  . BREAST SURGERY  1984  . COLON SURGERY  09/12/2006  . FEMUR SURGERY  2018   Broken  . TONSILLECTOMY  09/12/1944    Allergies  Allergen Reactions  . Barbiturates Other (See Comments)    A small amount "overdosed" the patient- "affected my stability and balance" (might have been given at the same as another med?)  . Cefdinir Other (See Comments)    A small amount "overdosed" the patient- "affected my stability and balance" (might have been given at the same as another med?)  . Phenobarbital Other (See Comments)    A small amount "overdosed" the patient- "affected my stability and balance" (might have been given at the same as another med?)  . Tegretol [Carbamazepine] Other (See Comments)    A  small amount "overdosed" the patient- "affected my stability and balance" (might have been given at the same as another med?)  . Zonisamide Other (See Comments)    Negative reaction    Allergies as of 02/27/2020      Reactions   Barbiturates Other (See Comments)   A small amount "overdosed" the patient- "affected my stability and balance" (might have been given at the same as another med?)   Cefdinir Other (See Comments)   A small amount "overdosed" the patient- "affected my stability and balance" (might have been given at the same as another med?)   Phenobarbital Other (See Comments)   A small amount "overdosed" the patient- "affected my stability and balance" (might have been given at the same as another med?)   Tegretol [carbamazepine] Other (See Comments)   A small amount "overdosed" the patient- "affected my stability and balance" (might have been given at the same as another med?)   Zonisamide Other (See Comments)   Negative reaction      Medication List       Accurate as of February 27, 2020 11:59 PM. If you have any questions, ask your nurse or doctor.        STOP taking these medications   divalproex 500 MG DR tablet Commonly known as: Depakote Stopped by: Caydee Talkington X Shaliah Wann, NP  TAKE these medications   ketoconazole 2 % shampoo Commonly known as: NIZORAL Apply 1 application topically once a week.   One-A-Day Womens 50 Plus Tabs Take 1 tablet by mouth daily with breakfast.   PreserVision/Lutein Caps Take 1 capsule by mouth daily with breakfast.   PreviDent 5000 Booster Plus 1.1 % Pste Generic drug: Sodium Fluoride Place 1 application onto teeth every evening.   Systane 0.4-0.3 % Soln Generic drug: Polyethyl Glycol-Propyl Glycol Place 1-2 drops into both eyes 3 (three) times daily as needed (for dryness).   Vitamin D-3 25 MCG (1000 UT) Caps TAKE 1 CAPSULE DAILY WITH BREAKFAST.       Review of Systems:  Review of Systems  Constitutional: Negative for fatigue,  fever and unexpected weight change.  HENT: Positive for hearing loss. Negative for congestion and voice change.   Respiratory: Positive for chest tightness. Negative for cough, shortness of breath and wheezing.        Occasionally  Cardiovascular: Positive for leg swelling. Negative for chest pain and palpitations.  Gastrointestinal: Negative for abdominal distention, abdominal pain, constipation and diarrhea.  Genitourinary: Negative for difficulty urinating, dysuria and urgency.  Musculoskeletal: Positive for arthralgias and gait problem.  Skin: Negative for color change.  Neurological: Negative for dizziness, seizures, speech difficulty, weakness and headaches.  Psychiatric/Behavioral: Negative for behavioral problems and sleep disturbance. The patient is not nervous/anxious.     Health Maintenance  Topic Date Due  . INFLUENZA VACCINE  04/12/2020  . TETANUS/TDAP  12/21/2025  . DEXA SCAN  Completed  . COVID-19 Vaccine  Completed  . PNA vac Low Risk Adult  Completed    Physical Exam: Vitals:   02/27/20 1302  BP: 118/74  Pulse: 86  Temp: (!) 97.5 F (36.4 C)  SpO2: 96%  Weight: 196 lb 9.6 oz (89.2 kg)  Height: 5\' 3"  (1.6 m)   Body mass index is 34.83 kg/m. Physical Exam Vitals and nursing note reviewed.  Constitutional:      Appearance: Normal appearance. She is obese.  HENT:     Head: Normocephalic and atraumatic.     Nose: Nose normal.     Mouth/Throat:     Mouth: Mucous membranes are moist.  Eyes:     Extraocular Movements: Extraocular movements intact.     Conjunctiva/sclera: Conjunctivae normal.     Pupils: Pupils are equal, round, and reactive to light.  Cardiovascular:     Rate and Rhythm: Normal rate and regular rhythm.     Heart sounds: No murmur heard.   Pulmonary:     Effort: Pulmonary effort is normal.     Breath sounds: No wheezing, rhonchi or rales.  Abdominal:     General: Bowel sounds are normal.     Palpations: Abdomen is soft.      Tenderness: There is no abdominal tenderness.  Musculoskeletal:     Cervical back: Normal range of motion and neck supple.     Right lower leg: Edema present.     Left lower leg: Edema present.     Comments: Trace edema BLE  Skin:    General: Skin is warm and dry.  Neurological:     General: No focal deficit present.     Mental Status: She is alert and oriented to person, place, and time. Mental status is at baseline.     Motor: No weakness.     Coordination: Coordination normal.     Gait: Gait abnormal.  Psychiatric:        Mood  and Affect: Mood normal.        Behavior: Behavior normal.        Thought Content: Thought content normal.        Judgment: Judgment normal.     Labs reviewed: Basic Metabolic Panel: Recent Labs    03/26/19 1056 03/26/19 1510 03/27/19 0422 11/05/19 1434  NA 139  --  139 141  K 3.5  --  3.8 3.8  CL 105  --  106 103  CO2 26  --  23 28  GLUCOSE 96  --  101* 120*  BUN 17  --  14 22  CREATININE 0.99  --  0.87 0.97  CALCIUM 9.4  --  9.2 9.4  TSH  --  1.601  --   --    Liver Function Tests: Recent Labs    03/26/19 1056  AST 20  ALT 25  ALKPHOS 113  BILITOT 0.4  PROT 6.9  ALBUMIN 3.5   No results for input(s): LIPASE, AMYLASE in the last 8760 hours. No results for input(s): AMMONIA in the last 8760 hours. CBC: Recent Labs    03/26/19 1056 03/27/19 0422 11/05/19 1434  WBC 17.4* 14.1* 12.2*  NEUTROABS  --   --  9.9*  HGB 13.2 12.5 12.8  HCT 42.6 39.8 42.0  MCV 91.8 90.0 90.5  PLT 272 263 255   Lipid Panel: No results for input(s): CHOL, HDL, LDLCALC, TRIG, CHOLHDL, LDLDIRECT in the last 8760 hours. Lab Results  Component Value Date   HGBA1C 6.2 04/16/2016    Procedures since last visit: No results found.  Assessment/Plan  Seizure disorder ([place]) Stable, pending Neurology f/u, off meds by self  Essential hypertension Blood pressure is controlled.   Hyperlipidemia Off,  Atorvastatin, LDL 135 02/25/20, dietitian consult.     Obese Elevated LDL, weight, early am around 3 am weakness, hungry, returns to sleep after snacks, sometime drunk like symptoms, diaphoretic, chest tightness last a few minutes at times, denied chest pian/SOB/plapitation, ? metabolic syndrome, will obtain EKG, check Hgb a1c.  EKG SR, vent rate 83bpm, no significant ST-T abnormality.    Labs/tests ordered:  Hgb a1c, EKG  Next appt:  3 months with Dr. Lyndel Safe

## 2020-02-27 NOTE — Assessment & Plan Note (Signed)
Blood pressure is controlled

## 2020-02-27 NOTE — Assessment & Plan Note (Addendum)
Stable, pending Neurology f/u, off meds by self

## 2020-02-27 NOTE — Assessment & Plan Note (Addendum)
Off,  Atorvastatin, LDL 135 02/25/20, dietitian consult.

## 2020-02-27 NOTE — Assessment & Plan Note (Addendum)
Elevated LDL, weight, early am around 3 am weakness, hungry, returns to sleep after snacks, sometime drunk like symptoms, diaphoretic, chest tightness last a few minutes at times, denied chest pian/SOB/plapitation, ? metabolic syndrome, will obtain EKG, check Hgb a1c.  EKG SR, vent rate 83bpm, no significant ST-T abnormality.

## 2020-03-03 ENCOUNTER — Other Ambulatory Visit: Payer: Self-pay

## 2020-03-03 DIAGNOSIS — E785 Hyperlipidemia, unspecified: Secondary | ICD-10-CM

## 2020-03-18 ENCOUNTER — Other Ambulatory Visit: Payer: Self-pay | Admitting: Nurse Practitioner

## 2020-03-18 DIAGNOSIS — Z6836 Body mass index (BMI) 36.0-36.9, adult: Secondary | ICD-10-CM

## 2020-03-18 DIAGNOSIS — R531 Weakness: Secondary | ICD-10-CM

## 2020-03-18 DIAGNOSIS — Z79899 Other long term (current) drug therapy: Secondary | ICD-10-CM

## 2020-03-18 DIAGNOSIS — E785 Hyperlipidemia, unspecified: Secondary | ICD-10-CM

## 2020-03-19 ENCOUNTER — Other Ambulatory Visit: Payer: Self-pay

## 2020-03-19 ENCOUNTER — Other Ambulatory Visit: Payer: Self-pay | Admitting: Nurse Practitioner

## 2020-03-19 DIAGNOSIS — E669 Obesity, unspecified: Secondary | ICD-10-CM | POA: Diagnosis not present

## 2020-03-19 DIAGNOSIS — Z6836 Body mass index (BMI) 36.0-36.9, adult: Secondary | ICD-10-CM | POA: Diagnosis not present

## 2020-03-20 LAB — HEMOGLOBIN A1C
Hgb A1c MFr Bld: 5.9 % of total Hgb — ABNORMAL HIGH (ref <5.7)
Mean Plasma Glucose: 123 (calc)
eAG (mmol/L): 6.8 (calc)

## 2020-04-28 DIAGNOSIS — L821 Other seborrheic keratosis: Secondary | ICD-10-CM | POA: Diagnosis not present

## 2020-04-28 DIAGNOSIS — Z85828 Personal history of other malignant neoplasm of skin: Secondary | ICD-10-CM | POA: Diagnosis not present

## 2020-04-28 DIAGNOSIS — L814 Other melanin hyperpigmentation: Secondary | ICD-10-CM | POA: Diagnosis not present

## 2020-04-28 DIAGNOSIS — D1801 Hemangioma of skin and subcutaneous tissue: Secondary | ICD-10-CM | POA: Diagnosis not present

## 2020-04-28 DIAGNOSIS — L570 Actinic keratosis: Secondary | ICD-10-CM | POA: Diagnosis not present

## 2020-04-28 DIAGNOSIS — L218 Other seborrheic dermatitis: Secondary | ICD-10-CM | POA: Diagnosis not present

## 2020-05-19 ENCOUNTER — Encounter (INDEPENDENT_AMBULATORY_CARE_PROVIDER_SITE_OTHER): Payer: Medicare Other | Admitting: Ophthalmology

## 2020-06-04 ENCOUNTER — Encounter (INDEPENDENT_AMBULATORY_CARE_PROVIDER_SITE_OTHER): Payer: Medicare Other | Admitting: Ophthalmology

## 2020-06-11 ENCOUNTER — Other Ambulatory Visit: Payer: Self-pay

## 2020-06-11 ENCOUNTER — Encounter (INDEPENDENT_AMBULATORY_CARE_PROVIDER_SITE_OTHER): Payer: Self-pay | Admitting: Ophthalmology

## 2020-06-11 ENCOUNTER — Ambulatory Visit (INDEPENDENT_AMBULATORY_CARE_PROVIDER_SITE_OTHER): Payer: Medicare Other | Admitting: Ophthalmology

## 2020-06-11 DIAGNOSIS — H353132 Nonexudative age-related macular degeneration, bilateral, intermediate dry stage: Secondary | ICD-10-CM

## 2020-06-11 NOTE — Patient Instructions (Signed)
Patient asked to report if new onset visual acuity decline or distortion ever develops in either eye.  She in fact would possibly notice that in the near vision initially.

## 2020-06-11 NOTE — Assessment & Plan Note (Signed)
OU with subfoveal pigment epithelial detachments, pigmentary migration under the retina.

## 2020-06-11 NOTE — Progress Notes (Signed)
06/11/2020     CHIEF COMPLAINT Patient presents for Retina Follow Up   HISTORY OF PRESENT ILLNESS: Katherine Obrien is a 79 y.o. female who presents to the clinic today for:   HPI    Retina Follow Up    Patient presents with  Dry AMD.  In both eyes.  Severity is moderate.  Duration of 4 months.  Since onset it is stable.  I, the attending physician,  performed the HPI with the patient and updated documentation appropriately.          Comments    4 Month Dry AMD f\u OU. OCT  Pt c/o a decrease in overall vision. Pt states she has trouble reading lighter print on the TV. Pt states when she looks at the [place] there are "wobbly things in it". Pt has trouble reading fine print as well.       Last edited by Tilda Franco on 06/11/2020  2:11 PM. (History)      Referring physician: Mast, Man X, NP [address],  [place] [address]  HISTORICAL INFORMATION:   Selected notes from the MEDICAL RECORD NUMBER    Lab Results  Component Value Date   HGBA1C 5.9 (H) 03/19/2020     CURRENT MEDICATIONS: Current Outpatient Medications (Ophthalmic Drugs)  Medication Sig  . Polyethyl Glycol-Propyl Glycol (SYSTANE) 0.4-0.3 % SOLN Place 1-2 drops into both eyes 3 (three) times daily as needed (for dryness).    No current facility-administered medications for this visit. (Ophthalmic Drugs)   Current Outpatient Medications (Other)  Medication Sig  . Cholecalciferol (VITAMIN D-3) 25 MCG (1000 UT) CAPS TAKE 1 CAPSULE DAILY WITH BREAKFAST.  Marland Kitchen ketoconazole (NIZORAL) 2 % shampoo Apply 1 application topically once a week.   . Multiple Vitamins-Minerals (ONE-A-DAY WOMENS 50 PLUS) TABS Take 1 tablet by mouth daily with breakfast.  . Multiple Vitamins-Minerals (PRESERVISION/LUTEIN) CAPS Take 1 capsule by mouth daily with breakfast.  . Sodium Fluoride (PREVIDENT 5000 BOOSTER PLUS) 1.1 % PSTE Place 1 application onto teeth every evening.  (Patient not taking: Reported on 02/27/2020)     No current facility-administered medications for this visit. (Other)      REVIEW OF SYSTEMS:    ALLERGIES Allergies  Allergen Reactions  . Barbiturates Other (See Comments)    A small amount "overdosed" the patient- "affected my stability and balance" (might have been given at the same as another med?)  . Cefdinir Other (See Comments)    A small amount "overdosed" the patient- "affected my stability and balance" (might have been given at the same as another med?)  . Phenobarbital Other (See Comments)    A small amount "overdosed" the patient- "affected my stability and balance" (might have been given at the same as another med?)  . Tegretol [Carbamazepine] Other (See Comments)    A small amount "overdosed" the patient- "affected my stability and balance" (might have been given at the same as another med?)  . Zonisamide Other (See Comments)    Negative reaction    PAST MEDICAL HISTORY Past Medical History:  Diagnosis Date  . Arthritis   . Cancer ([place])    skin  . Cataract 2010   Dr. Alexander Mt, DrJerlyn Ly @ [facility]  . Epilepsy ([place]) 08/11/2015  . Hyperlipidemia 12/08/2015  . Hypertension   . Polyp of colon 08/11/2015   Past Surgical History:  Procedure Laterality Date  . ABDOMINAL HYSTERECTOMY  1970  . APPENDECTOMY  1958  . BREAST SURGERY  [facility]  09/12/2006  . FEMUR SURGERY  2018   Broken  . TONSILLECTOMY  09/12/1944    FAMILY HISTORY Family History  Problem Relation Age of Onset  . Osteoporosis Mother   . Dementia Mother   . Arthritis Mother   . Stroke Father 10       mini  . Dementia Father   . CVA Father   . Cancer Sister        breast  . Dementia Sister        severe  . Alzheimer's disease Sister   . Diabetes Sister   . Hypertension Sister   . Stroke Paternal Grandfather        1970    SOCIAL HISTORY Social History   Tobacco Use  . Smoking status: Never Smoker  . Smokeless tobacco: Never Used  Vaping Use  . Vaping Use: Never used   Substance Use Topics  . Alcohol use: Never  . Drug use: Never         OPHTHALMIC EXAM:  Base Eye Exam    Visual Acuity (Snellen - Linear)      Right Left   Dist cc 20/60 + 20/50 -2   Dist ph cc NI NI   Correction: Glasses       Tonometry (Tonopen, 2:18 PM)      Right Left   Pressure 6 5       Pupils      Pupils Dark Light Shape React APD   Right PERRL 5 4 Round Brisk None   Left PERRL 5 4 Round Brisk None       Visual Fields (Counting fingers)      Left Right    Full Full       Neuro/Psych    Oriented x3: Yes   Mood/Affect: Normal       Dilation    Both eyes: 1.0% Mydriacyl, 2.5% Phenylephrine @ 2:18 PM        Slit Lamp and Fundus Exam    External Exam      Right Left   External Normal Normal       Slit Lamp Exam      Right Left   Lids/Lashes Normal Normal   Conjunctiva/Sclera White and quiet White and quiet   Cornea Clear Clear   Anterior Chamber Deep and quiet Deep and quiet   Iris Round and reactive Round and reactive   Lens Posterior chamber intraocular lens Posterior chamber intraocular lens   Anterior Vitreous Normal Normal       Fundus Exam      Right Left   Posterior Vitreous Posterior vitreous detachment Posterior vitreous detachment   Disc Normal Normal   C/D Ratio 0.55 0.6   Macula Retinal pigment epithelial mottling, Hard drusen, Retinal pigment epithelial atrophy, Early age related macular degeneration large drusenoid subfoveal PED., no hemorrhage, no exudates Retinal pigment epithelial mottling, Hard drusen, Retinal pigment epithelial atrophy, Early age related macular degeneration large drusenoid subfoveal PED., no hemorrhage, no exudates   Vessels Normal Normal   Periphery Normal Normal          IMAGING AND PROCEDURES  Imaging and Procedures for 06/11/20  OCT, Retina - OU - Both Eyes       Right Eye Quality was good. Scan locations included subfoveal. Central Foveal Thickness: 329. Progression has been stable.  Findings include abnormal foveal contour.   Left Eye Quality was good. Scan locations included subfoveal. Central Foveal Thickness: 304.  Progression has been stable. Findings include retinal drusen , abnormal foveal contour.   Notes No active CNVM OU, large subfoveal drusenoid soft drusen, with pigmentary migration into the retina, no signs of CNVM OU.                ASSESSMENT/PLAN:  Intermediate stage nonexudative age-related macular degeneration of both eyes OU with subfoveal pigment epithelial detachments, pigmentary migration under the retina.      ICD-10-CM   1. Intermediate stage nonexudative age-related macular degeneration of both eyes  H35.3132 OCT, Retina - OU - Both Eyes    1.  No signs of wet ARMD, no CNVM OU. 2.  3.  Ophthalmic Meds Ordered this visit:  No orders of the defined types were placed in this encounter.      Return in about 6 months (around 12/09/2020) for DILATE OU, OCT.  Patient Instructions  Patient asked to report if new onset visual acuity decline or distortion ever develops in either eye.  She in fact would possibly notice that in the near vision initially.    Explained the diagnoses, plan, and follow up with the patient and they expressed understanding.  Patient expressed understanding of the importance of proper follow up care.   Clent Demark Brayden Brodhead M.D. Diseases & Surgery of the Retina and Vitreous Retina & Diabetic [place] 06/11/20     Abbreviations: M myopia (nearsighted); A astigmatism; H hyperopia (farsighted); P presbyopia; Mrx spectacle prescription;  CTL contact lenses; OD right eye; OS left eye; OU both eyes  XT exotropia; ET esotropia; PEK punctate epithelial keratitis; PEE punctate epithelial erosions; DES dry eye syndrome; MGD meibomian gland dysfunction; ATs artificial tears; PFAT's preservative free artificial tears; [place] nuclear sclerotic cataract; PSC posterior subcapsular cataract; ERM epi-retinal membrane; PVD  posterior vitreous detachment; RD retinal detachment; DM diabetes mellitus; DR diabetic retinopathy; NPDR non-proliferative diabetic retinopathy; PDR proliferative diabetic retinopathy; CSME clinically significant macular edema; DME diabetic macular edema; dbh dot blot hemorrhages; CWS cotton wool spot; POAG primary open angle glaucoma; C/D cup-to-disc ratio; HVF humphrey visual field; GVF goldmann visual field; OCT optical coherence tomography; IOP intraocular pressure; BRVO Branch retinal vein occlusion; CRVO central retinal vein occlusion; CRAO central retinal artery occlusion; BRAO branch retinal artery occlusion; RT retinal tear; SB scleral buckle; PPV pars plana vitrectomy; VH Vitreous hemorrhage; PRP panretinal laser photocoagulation; IVK intravitreal kenalog; VMT vitreomacular traction; MH Macular hole;  NVD neovascularization of the disc; NVE neovascularization elsewhere; AREDS age related eye disease study; ARMD age related macular degeneration; POAG primary open angle glaucoma; EBMD epithelial/anterior basement membrane dystrophy; ACIOL anterior chamber intraocular lens; IOL intraocular lens; PCIOL posterior chamber intraocular lens; Phaco/IOL phacoemulsification with intraocular lens placement; [place] photorefractive keratectomy; LASIK laser assisted in situ keratomileusis; HTN hypertension; DM diabetes mellitus; COPD chronic obstructive pulmonary disease

## 2020-06-30 DIAGNOSIS — H353132 Nonexudative age-related macular degeneration, bilateral, intermediate dry stage: Secondary | ICD-10-CM | POA: Diagnosis not present

## 2020-06-30 DIAGNOSIS — H43811 Vitreous degeneration, right eye: Secondary | ICD-10-CM | POA: Diagnosis not present

## 2020-06-30 DIAGNOSIS — Z961 Presence of intraocular lens: Secondary | ICD-10-CM | POA: Diagnosis not present

## 2020-06-30 DIAGNOSIS — H04123 Dry eye syndrome of bilateral lacrimal glands: Secondary | ICD-10-CM | POA: Diagnosis not present

## 2020-07-03 ENCOUNTER — Encounter: Payer: Self-pay | Admitting: Adult Health

## 2020-07-03 ENCOUNTER — Ambulatory Visit (INDEPENDENT_AMBULATORY_CARE_PROVIDER_SITE_OTHER): Payer: Medicare Other | Admitting: Adult Health

## 2020-07-03 ENCOUNTER — Other Ambulatory Visit: Payer: Self-pay

## 2020-07-03 VITALS — BP 180/80 | HR 86 | Temp 98.1°F | Resp 20 | Ht 63.0 in

## 2020-07-03 DIAGNOSIS — J029 Acute pharyngitis, unspecified: Secondary | ICD-10-CM | POA: Diagnosis not present

## 2020-07-03 DIAGNOSIS — I10 Essential (primary) hypertension: Secondary | ICD-10-CM

## 2020-07-03 DIAGNOSIS — Z23 Encounter for immunization: Secondary | ICD-10-CM

## 2020-07-03 DIAGNOSIS — R490 Dysphonia: Secondary | ICD-10-CM | POA: Diagnosis not present

## 2020-07-03 LAB — POCT INFLUENZA A/B
Influenza A, POC: NEGATIVE
Influenza B, POC: NEGATIVE

## 2020-07-03 LAB — POCT RAPID STREP A (OFFICE): Rapid Strep A Screen: NEGATIVE

## 2020-07-03 NOTE — Patient Instructions (Addendum)

## 2020-07-03 NOTE — Progress Notes (Signed)
[facility] clinic  Provider:   Durenda Age DNP  Code Status:  DNR  Goals of Care:  Advanced Directives 07/03/2020  Does Patient Have a Medical Advance Directive? Yes  Type of Advance Directive Out of facility DNR (pink MOST or yellow form)  Does patient want to make changes to medical advance directive? No - Patient declined  Copy of [place] in Chart? -  Pre-existing out of facility DNR order (yellow form or pink MOST form) Yellow form placed in chart (order not valid for inpatient use);Pink MOST form placed in chart (order not valid for inpatient use)     Chief Complaint  Patient presents with  . Acute Visit    Sore Throat    HPI: Patient is a 79 y.o. female seen today for an acute visit for voice hoarseness and mild sore throat. She denies having chills, cough, nor fever. She attended a funeral 2 days ago and the family of the deceased did not have COVID-19 vaccine. She stayed in a motel and turned the heat on in the low 70s F. When she woke up the next morning, she had sore/dry throat and horse voice. She feels like her hoarseness is getting worse. She is fully vaccinated with Moderna COVID-19 vaccine, 2/2. She is waiting on her COVID-19 booster. She wanted to get flu vaccine today if she tests negative for rapid strep throat and Influenza A/B.  BP today was 180/80. She does not take any medications for hypertension. She denies having headache. BP re-test 120/78.  Past Medical History:  Diagnosis Date  . Arthritis   . Cancer ([place])    skin  . Cataract 2010   Dr. Alexander Mt, DrJerlyn Ly @ [place]  . Epilepsy ([place]) 08/11/2015  . Hyperlipidemia 12/08/2015  . Hypertension   . Polyp of colon 08/11/2015    Past Surgical History:  Procedure Laterality Date  . ABDOMINAL HYSTERECTOMY  1970  . APPENDECTOMY  1958  . BREAST SURGERY  1984  . COLON SURGERY  09/12/2006  . FEMUR SURGERY  2018   Broken  . TONSILLECTOMY  09/12/1944    Allergies  Allergen Reactions    . Barbiturates Other (See Comments)    A small amount "overdosed" the patient- "affected my stability and balance" (might have been given at the same as another med?)  . Cefdinir Other (See Comments)    A small amount "overdosed" the patient- "affected my stability and balance" (might have been given at the same as another med?)  . Phenobarbital Other (See Comments)    A small amount "overdosed" the patient- "affected my stability and balance" (might have been given at the same as another med?)  . Tegretol [Carbamazepine] Other (See Comments)    A small amount "overdosed" the patient- "affected my stability and balance" (might have been given at the same as another med?)  . Zonisamide Other (See Comments)    Negative reaction    Outpatient Encounter Medications as of 07/03/2020  Medication Sig  . Cholecalciferol (VITAMIN D-3) 25 MCG (1000 UT) CAPS TAKE 1 CAPSULE DAILY WITH BREAKFAST.  Marland Kitchen ketoconazole (NIZORAL) 2 % shampoo Apply 1 application topically once a week.   . Multiple Vitamins-Minerals (ONE-A-DAY WOMENS 50 PLUS) TABS Take 1 tablet by mouth daily with breakfast.  . Multiple Vitamins-Minerals (PRESERVISION/LUTEIN) CAPS Take 1 capsule by mouth daily with breakfast.  . Polyethyl Glycol-Propyl Glycol (SYSTANE) 0.4-0.3 % SOLN Place 1-2 drops into both eyes 3 (three) times daily as needed (for dryness).   Marland Kitchen  Sodium Fluoride (PREVIDENT 5000 BOOSTER PLUS) 1.1 % PSTE Place 1 application onto teeth daily as needed.    No facility-administered encounter medications on file as of 07/03/2020.    Review of Systems:  Review of Systems  Constitutional: Negative for activity change, appetite change, chills and fever.  HENT: Negative for ear pain and hearing loss.        Dry throat with mild soreness  Eyes: Negative for discharge and itching.  Respiratory: Negative for cough and shortness of breath.   Cardiovascular: Negative for chest pain.  Gastrointestinal: Negative for abdominal distention  and abdominal pain.  Endocrine: Negative.   Genitourinary: Negative for difficulty urinating and urgency.  Skin: Negative for color change and rash.  Neurological: Negative for dizziness and weakness.  Psychiatric/Behavioral: Negative.     Health Maintenance  Topic Date Due  . TETANUS/TDAP  12/21/2025  . INFLUENZA VACCINE  Completed  . DEXA SCAN  Completed  . COVID-19 Vaccine  Completed  . PNA vac Low Risk Adult  Completed    Physical Exam: Vitals:   07/03/20 1509  BP: (!) 180/80  Pulse: 86  Resp: 20  Temp: 98.1 F (36.7 C)  TempSrc: Temporal  SpO2: 95%  Height: 5\' 3"  (1.6 m)   Body mass index is 34.83 kg/m. Physical Exam Constitutional:      Appearance: Normal appearance.  HENT:     Head: Normocephalic.     Nose: Nose normal.     Mouth/Throat:     Mouth: Mucous membranes are moist.     Pharynx: Oropharynx is clear.  Cardiovascular:     Rate and Rhythm: Normal rate and regular rhythm.     Pulses: Normal pulses.     Heart sounds: Normal heart sounds.  Pulmonary:     Effort: Pulmonary effort is normal.     Breath sounds: Normal breath sounds.  Abdominal:     General: Bowel sounds are normal.  Musculoskeletal:     Cervical back: Normal range of motion.     Right lower leg: 2+ Edema present.     Left lower leg: 2+ Edema present.  Neurological:     Mental Status: She is alert.     Labs reviewed: Basic Metabolic Panel: Recent Labs    11/05/19 1434  NA 141  K 3.8  CL 103  CO2 28  GLUCOSE 120*  BUN 22  CREATININE 0.97  CALCIUM 9.4   CBC: Recent Labs    11/05/19 1434  WBC 12.2*  NEUTROABS 9.9*  HGB 12.8  HCT 42.0  MCV 90.5  PLT 255    Lab Results  Component Value Date   HGBA1C 5.9 (H) 03/19/2020    Procedures since last visit: OCT, Retina - OU - Both Eyes  Result Date: 06/11/2020 Right Eye Quality was good. Scan locations included subfoveal. Central Foveal Thickness: 329. Progression has been stable. Findings include abnormal foveal  contour. Left Eye Quality was good. Scan locations included subfoveal. Central Foveal Thickness: 304. Progression has been stable. Findings include retinal drusen , abnormal foveal contour. Notes No active CNVM OU, large subfoveal drusenoid soft drusen, with pigmentary migration into the retina, no signs of CNVM OU.   Assessment/Plan  1. Voice hoarseness/Sore throat -  Drink more water, at least 8 cups of water a day -  Gargle with warm salty water 3-4 X/day - Rest -  Tested negative for Rapid strep and Influenza A/B -  Will notify for lab results done today  2. Flu vaccine need -  will give flu vaccine today  3.  Essential hypertension -  1st BP check was 180/80 -  Not on any medications - BP re-check 120/78 -  Instructed to monitor and log BP at home    Labs/tests ordered:  Rapid Strep A, Infuenza A/B, COVID-19  Next appt:  Follow up if symptoms does not resolve

## 2020-07-04 LAB — SARS-COV-2 RNA,(COVID-19) QUALITATIVE NAAT: SARS CoV2 RNA: NOT DETECTED

## 2020-07-21 DIAGNOSIS — Z23 Encounter for immunization: Secondary | ICD-10-CM | POA: Diagnosis not present

## 2020-08-25 DIAGNOSIS — L814 Other melanin hyperpigmentation: Secondary | ICD-10-CM | POA: Diagnosis not present

## 2020-08-25 DIAGNOSIS — D1801 Hemangioma of skin and subcutaneous tissue: Secondary | ICD-10-CM | POA: Diagnosis not present

## 2020-08-25 DIAGNOSIS — L821 Other seborrheic keratosis: Secondary | ICD-10-CM | POA: Diagnosis not present

## 2020-08-25 DIAGNOSIS — L570 Actinic keratosis: Secondary | ICD-10-CM | POA: Diagnosis not present

## 2020-08-25 DIAGNOSIS — Z85828 Personal history of other malignant neoplasm of skin: Secondary | ICD-10-CM | POA: Diagnosis not present

## 2020-10-05 ENCOUNTER — Telehealth: Payer: Self-pay | Admitting: Nurse Practitioner

## 2020-10-05 NOTE — Telephone Encounter (Signed)
Called to schedule AWV. Patient not interested, hung up while I was speaking to her.

## 2020-12-10 ENCOUNTER — Encounter (INDEPENDENT_AMBULATORY_CARE_PROVIDER_SITE_OTHER): Payer: Self-pay | Admitting: Ophthalmology

## 2020-12-10 ENCOUNTER — Ambulatory Visit (INDEPENDENT_AMBULATORY_CARE_PROVIDER_SITE_OTHER): Payer: Medicare Other | Admitting: Ophthalmology

## 2020-12-10 ENCOUNTER — Other Ambulatory Visit: Payer: Self-pay

## 2020-12-10 DIAGNOSIS — H353132 Nonexudative age-related macular degeneration, bilateral, intermediate dry stage: Secondary | ICD-10-CM | POA: Diagnosis not present

## 2020-12-10 NOTE — Assessment & Plan Note (Signed)
Persistent drusenoid findings with overlying pigmentary changes no change over the last 64-month interval and no signs of CNVM clinically or on OCT examination today

## 2020-12-10 NOTE — Progress Notes (Signed)
12/10/2020     CHIEF COMPLAINT Patient presents for Retina Follow Up (6 Mo F/U OU//Pt c/o difficulty reading the newspaper x 2 weeks, OU, OS>OD. Pt sts she dropped her glasses recently, and feels maybe they might be loose. Pt denies ocular pain. Pt reports waviness off and on OU. )   HISTORY OF PRESENT ILLNESS: Katherine Obrien is a 80 y.o. female who presents to the clinic today for:   HPI    Retina Follow Up    Patient presents with  Dry AMD.  In both eyes.  This started 6 months ago.  Severity is mild.  Duration of 6 months.  Since onset it is stable. Additional comments: 6 Mo F/U OU  Pt c/o difficulty reading the newspaper x 2 weeks, OU, OS>OD. Pt sts she dropped her glasses recently, and feels maybe they might be loose. Pt denies ocular pain. Pt reports waviness off and on OU.        Last edited by Rockie Neighbours, Emanuel on 12/10/2020  1:12 PM. (History)      Referring physician: Mast, Man X, NP [address],  [place] [address]  HISTORICAL INFORMATION:   Selected notes from the MEDICAL RECORD NUMBER    Lab Results  Component Value Date   HGBA1C 5.9 (H) 03/19/2020     CURRENT MEDICATIONS: Current Outpatient Medications (Ophthalmic Drugs)  Medication Sig  . Polyethyl Glycol-Propyl Glycol (SYSTANE) 0.4-0.3 % SOLN Place 1-2 drops into both eyes 3 (three) times daily as needed (for dryness).    No current facility-administered medications for this visit. (Ophthalmic Drugs)   Current Outpatient Medications (Other)  Medication Sig  . Cholecalciferol (VITAMIN D-3) 25 MCG (1000 UT) CAPS TAKE 1 CAPSULE DAILY WITH BREAKFAST.  Marland Kitchen ketoconazole (NIZORAL) 2 % shampoo Apply 1 application topically once a week.   . Multiple Vitamins-Minerals (ONE-A-DAY WOMENS 50 PLUS) TABS Take 1 tablet by mouth daily with breakfast.  . Multiple Vitamins-Minerals (PRESERVISION/LUTEIN) CAPS Take 1 capsule by mouth daily with breakfast.  . Sodium Fluoride (PREVIDENT 5000 BOOSTER PLUS) 1.1 %  PSTE Place 1 application onto teeth daily as needed.    No current facility-administered medications for this visit. (Other)      REVIEW OF SYSTEMS:    ALLERGIES Allergies  Allergen Reactions  . Barbiturates Other (See Comments)    A small amount "overdosed" the patient- "affected my stability and balance" (might have been given at the same as another med?)  . Cefdinir Other (See Comments)    A small amount "overdosed" the patient- "affected my stability and balance" (might have been given at the same as another med?)  . Phenobarbital Other (See Comments)    A small amount "overdosed" the patient- "affected my stability and balance" (might have been given at the same as another med?)  . Tegretol [Carbamazepine] Other (See Comments)    A small amount "overdosed" the patient- "affected my stability and balance" (might have been given at the same as another med?)  . Zonisamide Other (See Comments)    Negative reaction    PAST MEDICAL HISTORY Past Medical History:  Diagnosis Date  . Arthritis   . Cancer ([place])    skin  . Cataract 2010   Dr. Alexander Mt, DrJerlyn Ly @ [place]  . Epilepsy ([place]) 08/11/2015  . Hyperlipidemia 12/08/2015  . Hypertension   . Polyp of colon 08/11/2015   Past Surgical History:  Procedure Laterality Date  . ABDOMINAL HYSTERECTOMY  1970  . APPENDECTOMY  [place]  . COLON SURGERY  09/12/2006  . FEMUR SURGERY  2018   Broken  . TONSILLECTOMY  09/12/1944    FAMILY HISTORY Family History  Problem Relation Age of Onset  . Osteoporosis Mother   . Dementia Mother   . Arthritis Mother   . Stroke Father 72       mini  . Dementia Father   . CVA Father   . Cancer Sister        breast  . Dementia Sister        severe  . Alzheimer's disease Sister   . Diabetes Sister   . Hypertension Sister   . Stroke Paternal Grandfather        1970    SOCIAL HISTORY Social History   Tobacco Use  . Smoking status: Never Smoker  . Smokeless  tobacco: Never Used  Vaping Use  . Vaping Use: Never used  Substance Use Topics  . Alcohol use: Never  . Drug use: Never         OPHTHALMIC EXAM: Base Eye Exam    Visual Acuity (ETDRS)      Right Left   Dist cc 20/40 +2 20/60ecc +1   Dist ph cc NI NI   Correction: Glasses       Tonometry (Tonopen, 1:12 PM)      Right Left   Pressure 07 09       Pupils      Pupils Dark Light Shape React APD   Right PERRL 5.5 4.5 Round Brisk None   Left PERRL 5.5 4.5 Round Brisk None       Visual Fields (Counting fingers)      Left Right    Full Full       Extraocular Movement      Right Left    Full Full       Neuro/Psych    Oriented x3: Yes   Mood/Affect: Normal       Dilation    Both eyes: 1.0% Mydriacyl, 2.5% Phenylephrine @ 1:16 PM        Slit Lamp and Fundus Exam    External Exam      Right Left   External Normal Normal       Slit Lamp Exam      Right Left   Lids/Lashes Normal Normal   Conjunctiva/Sclera White and quiet White and quiet   Cornea Clear Clear   Anterior Chamber Deep and quiet Deep and quiet   Iris Round and reactive Round and reactive   Lens Posterior chamber intraocular lens Posterior chamber intraocular lens   Anterior Vitreous Normal Normal       Fundus Exam      Right Left   Posterior Vitreous Posterior vitreous detachment Posterior vitreous detachment   Disc Normal Normal   C/D Ratio 0.55 0.6   Macula Retinal pigment epithelial mottling, Retinal pigment epithelial atrophy, Early age related macular degeneration large drusenoid subfoveal PED., no hemorrhage, no exudates, Soft drusen Retinal pigment epithelial mottling, Retinal pigment epithelial atrophy, Early age related macular degeneration large drusenoid subfoveal PED., no hemorrhage, no exudates, Soft drusen   Vessels Normal Normal   Periphery Normal Normal          IMAGING AND PROCEDURES  Imaging and Procedures for 12/10/20  OCT, Retina - OU - Both Eyes       Right  Eye Quality was good. Scan locations included subfoveal. Central Foveal Thickness: 368. Progression  has been stable. Findings include abnormal foveal contour.   Left Eye Quality was good. Scan locations included subfoveal. Central Foveal Thickness: 273. Progression has been stable. Findings include retinal drusen , abnormal foveal contour.   Notes No active CNVM OU, large subfoveal drusenoid soft drusen, with pigmentary migration into the retina, no signs of CNVM OU.                ASSESSMENT/PLAN:  Intermediate stage nonexudative age-related macular degeneration of both eyes Persistent drusenoid findings with overlying pigmentary changes no change over the last 31-month interval and no signs of CNVM clinically or on OCT examination today      ICD-10-CM   1. Intermediate stage nonexudative age-related macular degeneration of both eyes  H35.3132 OCT, Retina - OU - Both Eyes    1.  Stable over time, no interval change over the last 6 months.  Acuity is limited by the subfoveal large drusenoid pigment epithelial detachments, but no signs of CNVM will continue to monitor as currently with stable acuity  2.  3.  Ophthalmic Meds Ordered this visit:  No orders of the defined types were placed in this encounter.      Return in about 6 months (around 06/11/2021) for DILATE OU, OCT.  There are no Patient Instructions on file for this visit.   Explained the diagnoses, plan, and follow up with the patient and they expressed understanding.  Patient expressed understanding of the importance of proper follow up care.   Clent Demark Glennette Galster M.D. Diseases & Surgery of the Retina and Vitreous [facility]ina [facility] Di[facility] [place] 12/10/20     Abbreviations: M myopia (nearsighted); A astigmatism; H hyperopia (farsighted); P presbyopia; Mrx spectacle prescription;  CTL contact lenses; OD right eye; OS left eye; OU both eyes  XT exotropia; ET esotropia; PEK punctate epithelial keratitis; PEE  punctate epithelial erosions; DES dry eye syndrome; MGD meibomian gland dysfunction; ATs artificial tears; PFAT's preservative free artificial tears; [place] nuclear sclerotic cataract; PSC posterior subcapsular cataract; ERM epi-retinal membrane; PVD posterior vitreous detachment; RD retinal detachment; DM diabetes mellitus; DR diabetic retinopathy; NPDR non-proliferative diabetic retinopathy; PDR proliferative diabetic retinopathy; CSME clinically significant macular edema; DME diabetic macular edema; dbh dot blot hemorrhages; CWS cotton wool spot; POAG primary open angle glaucoma; C/D cup-to-disc ratio; HVF humphrey visual field; GVF goldmann visual field; OCT optical coherence tomography; IOP intraocular pressure; BRVO Branch retinal vein occlusion; CRVO central retinal vein occlusion; CRAO central retinal artery occlusion; BRAO branch retinal artery occlusion; RT retinal tear; SB scleral buckle; PPV pars plana vitrectomy; VH Vitreous hemorrhage; PRP panretinal laser photocoagulation; IVK intravitreal kenalog; VMT vitreomacular traction; MH Macular hole;  NVD neovascularization of the disc; NVE neovascularization elsewhere; AREDS age related eye disease study; ARMD age related macular degeneration; POAG primary open angle glaucoma; EBMD epithelial/anterior basement membrane dystrophy; ACIOL anterior chamber intraocular lens; IOL intraocular lens; PCIOL posterior chamber intraocular lens; Phaco/IOL phacoemulsification with intraocular lens placement; [place] photorefractive keratectomy; LASIK laser assisted in situ keratomileusis; HTN hypertension; DM diabetes mellitus; COPD chronic obstructive pulmonary disease

## 2020-12-22 DIAGNOSIS — L814 Other melanin hyperpigmentation: Secondary | ICD-10-CM | POA: Diagnosis not present

## 2020-12-22 DIAGNOSIS — L218 Other seborrheic dermatitis: Secondary | ICD-10-CM | POA: Diagnosis not present

## 2020-12-22 DIAGNOSIS — L821 Other seborrheic keratosis: Secondary | ICD-10-CM | POA: Diagnosis not present

## 2020-12-22 DIAGNOSIS — L570 Actinic keratosis: Secondary | ICD-10-CM | POA: Diagnosis not present

## 2020-12-22 DIAGNOSIS — Z85828 Personal history of other malignant neoplasm of skin: Secondary | ICD-10-CM | POA: Diagnosis not present

## 2020-12-22 DIAGNOSIS — D229 Melanocytic nevi, unspecified: Secondary | ICD-10-CM | POA: Diagnosis not present

## 2020-12-27 DIAGNOSIS — R29810 Facial weakness: Secondary | ICD-10-CM | POA: Diagnosis not present

## 2020-12-27 DIAGNOSIS — I10 Essential (primary) hypertension: Secondary | ICD-10-CM | POA: Diagnosis not present

## 2020-12-27 DIAGNOSIS — R202 Paresthesia of skin: Secondary | ICD-10-CM | POA: Diagnosis not present

## 2020-12-27 DIAGNOSIS — R531 Weakness: Secondary | ICD-10-CM | POA: Diagnosis not present

## 2021-01-21 DIAGNOSIS — Z23 Encounter for immunization: Secondary | ICD-10-CM | POA: Diagnosis not present

## 2021-02-02 DIAGNOSIS — H43811 Vitreous degeneration, right eye: Secondary | ICD-10-CM | POA: Diagnosis not present

## 2021-02-02 DIAGNOSIS — H04123 Dry eye syndrome of bilateral lacrimal glands: Secondary | ICD-10-CM | POA: Diagnosis not present

## 2021-02-02 DIAGNOSIS — H353132 Nonexudative age-related macular degeneration, bilateral, intermediate dry stage: Secondary | ICD-10-CM | POA: Diagnosis not present

## 2021-02-02 DIAGNOSIS — Z961 Presence of intraocular lens: Secondary | ICD-10-CM | POA: Diagnosis not present

## 2021-02-18 DIAGNOSIS — L821 Other seborrheic keratosis: Secondary | ICD-10-CM | POA: Diagnosis not present

## 2021-02-18 DIAGNOSIS — L814 Other melanin hyperpigmentation: Secondary | ICD-10-CM | POA: Diagnosis not present

## 2021-02-18 DIAGNOSIS — L570 Actinic keratosis: Secondary | ICD-10-CM | POA: Diagnosis not present

## 2021-02-18 DIAGNOSIS — Z85828 Personal history of other malignant neoplasm of skin: Secondary | ICD-10-CM | POA: Diagnosis not present

## 2021-03-23 DIAGNOSIS — D229 Melanocytic nevi, unspecified: Secondary | ICD-10-CM | POA: Diagnosis not present

## 2021-03-23 DIAGNOSIS — L570 Actinic keratosis: Secondary | ICD-10-CM | POA: Diagnosis not present

## 2021-03-23 DIAGNOSIS — L821 Other seborrheic keratosis: Secondary | ICD-10-CM | POA: Diagnosis not present

## 2021-03-23 DIAGNOSIS — Z85828 Personal history of other malignant neoplasm of skin: Secondary | ICD-10-CM | POA: Diagnosis not present

## 2021-03-23 DIAGNOSIS — L853 Xerosis cutis: Secondary | ICD-10-CM | POA: Diagnosis not present

## 2021-05-19 ENCOUNTER — Emergency Department (HOSPITAL_COMMUNITY)
Admission: EM | Admit: 2021-05-19 | Discharge: 2021-05-20 | Disposition: A | Discharge status: Home / Self Care | Payer: Medicare Other | Attending: Emergency Medicine | Admitting: Emergency Medicine

## 2021-05-19 ENCOUNTER — Other Ambulatory Visit: Payer: Self-pay

## 2021-05-19 ENCOUNTER — Emergency Department (HOSPITAL_COMMUNITY): Payer: Medicare Other

## 2021-05-19 DIAGNOSIS — R791 Abnormal coagulation profile: Secondary | ICD-10-CM | POA: Insufficient documentation

## 2021-05-19 DIAGNOSIS — R569 Unspecified convulsions: Secondary | ICD-10-CM | POA: Diagnosis not present

## 2021-05-19 DIAGNOSIS — G40909 Epilepsy, unspecified, not intractable, without status epilepticus: Secondary | ICD-10-CM | POA: Diagnosis not present

## 2021-05-19 DIAGNOSIS — Z85828 Personal history of other malignant neoplasm of skin: Secondary | ICD-10-CM | POA: Diagnosis not present

## 2021-05-19 DIAGNOSIS — R000 Tachycardia, unspecified: Secondary | ICD-10-CM | POA: Diagnosis not present

## 2021-05-19 DIAGNOSIS — I6782 Cerebral ischemia: Secondary | ICD-10-CM | POA: Diagnosis not present

## 2021-05-19 DIAGNOSIS — G238 Other specified degenerative diseases of basal ganglia: Secondary | ICD-10-CM | POA: Diagnosis not present

## 2021-05-19 DIAGNOSIS — R4020 Unspecified coma: Secondary | ICD-10-CM | POA: Diagnosis not present

## 2021-05-19 DIAGNOSIS — I10 Essential (primary) hypertension: Secondary | ICD-10-CM | POA: Diagnosis not present

## 2021-05-19 DIAGNOSIS — G9389 Other specified disorders of brain: Secondary | ICD-10-CM | POA: Diagnosis not present

## 2021-05-19 DIAGNOSIS — R404 Transient alteration of awareness: Secondary | ICD-10-CM | POA: Diagnosis not present

## 2021-05-19 DIAGNOSIS — G319 Degenerative disease of nervous system, unspecified: Secondary | ICD-10-CM | POA: Diagnosis not present

## 2021-05-19 LAB — CBG MONITORING, ED: Glucose-Capillary: 123 mg/dL — ABNORMAL HIGH (ref 70–99)

## 2021-05-19 LAB — COMPREHENSIVE METABOLIC PANEL
ALT: 18 U/L (ref 0–44)
AST: 21 U/L (ref 15–41)
Albumin: 3.7 g/dL (ref 3.5–5.0)
Alkaline Phosphatase: 83 U/L (ref 38–126)
Anion gap: 11 (ref 5–15)
BUN: 19 mg/dL (ref 8–23)
CO2: 25 mmol/L (ref 22–32)
Calcium: 8.9 mg/dL (ref 8.9–10.3)
Chloride: 102 mmol/L (ref 98–111)
Creatinine, Ser: 0.95 mg/dL (ref 0.44–1.00)
GFR, Estimated: >60 mL/min (ref >60)
Glucose, Bld: 112 mg/dL — ABNORMAL HIGH (ref 70–99)
Potassium: 3.6 mmol/L (ref 3.5–5.1)
Sodium: 138 mmol/L (ref 135–145)
Total Bilirubin: 0.4 mg/dL (ref 0.3–1.2)
Total Protein: 7.2 g/dL (ref 6.5–8.1)

## 2021-05-19 LAB — CBC WITH DIFFERENTIAL/PLATELET
Abs Immature Granulocytes: 0.08 10*3/uL — ABNORMAL HIGH (ref 0.00–0.07)
Basophils Absolute: 0.1 10*3/uL (ref 0.0–0.1)
Basophils Relative: 0 %
Eosinophils Absolute: 0.3 10*3/uL (ref 0.0–0.5)
Eosinophils Relative: 3 %
HCT: 42.1 % (ref 36.0–46.0)
Hemoglobin: 13.1 g/dL (ref 12.0–15.0)
Immature Granulocytes: 1 %
Lymphocytes Relative: 27 %
Lymphs Abs: 3.3 10*3/uL (ref 0.7–4.0)
MCH: 28.4 pg (ref 26.0–34.0)
MCHC: 31.1 g/dL (ref 30.0–36.0)
MCV: 91.3 fL (ref 80.0–100.0)
Monocytes Absolute: 0.9 10*3/uL (ref 0.1–1.0)
Monocytes Relative: 7 %
Neutro Abs: 7.4 10*3/uL (ref 1.7–7.7)
Neutrophils Relative %: 62 %
Platelets: 272 10*3/uL (ref 150–400)
RBC: 4.61 MIL/uL (ref 3.87–5.11)
RDW: 14.8 % (ref 11.5–15.5)
WBC: 12.1 10*3/uL — ABNORMAL HIGH (ref 4.0–10.5)
nRBC: 0 % (ref 0.0–0.2)

## 2021-05-19 LAB — PROTIME-INR
INR: 1 (ref 0.8–1.2)
Prothrombin Time: 12.8 seconds (ref 11.4–15.2)

## 2021-05-19 LAB — URINALYSIS, ROUTINE W REFLEX MICROSCOPIC
Bilirubin Urine: NEGATIVE
Glucose, UA: NEGATIVE mg/dL
Hgb urine dipstick: NEGATIVE
Ketones, ur: NEGATIVE mg/dL
Leukocytes,Ua: NEGATIVE
Nitrite: NEGATIVE
Protein, ur: NEGATIVE mg/dL
Specific Gravity, Urine: 1.009 (ref 1.005–1.030)
pH: 7 (ref 5.0–8.0)

## 2021-05-19 MED ORDER — SODIUM CHLORIDE 0.9 % IV SOLN: INTRAVENOUS | Status: DC

## 2021-05-19 NOTE — ED Notes (Signed)
[facility] contacted, transport arranged for patient back home.

## 2021-05-19 NOTE — ED Provider Notes (Signed)
[facility] DEPT Provider Note   CSN: [id number] Arrival date & time: 05/19/21  1804     History Chief Complaint  Patient presents with   Seizures     Katherine Obrien is a 80 y.o. female.  Pt presents to the ED today with a seizure.  Pt has a hx of seizure d/o, but is off all seizure meds.  Pt last had a seizure in June of 2021.  She was on Depakote, but before that, she was on Zonegran.    She was supposed to see a neurologist in [place], but has not done so yet.  She last saw a neurologist in [place] a few years ago.  Today, pt had a witnessed seizure.  She was in the cafeteria and had a seizure for a few minutes.  She was post ictal when EMS arrived, but she is back to normal now.  She did not have an injury today with her seizure as she was assisted to the ground.      Past Medical History:  Diagnosis Date   Arthritis    Cancer [facility])    skin   Cataract 2010   Dr. Alexander Mt, Dr.. Jerlyn Ly @ [facility]   Epilepsy [facility]) 08/11/2015   Hyperlipidemia 12/08/2015   Hypertension    Polyp of colon 08/11/2015    Patient Active Problem List   Diagnosis Date Noted   Intermediate stage nonexudative age-related macular degeneration of both eyes 01/13/2020   Serous detachment of retinal pigment epithelium of right eye 01/13/2020   Serous detachment of retinal pigment epithelium of left eye 01/13/2020   Pseudophakia, both eyes 01/13/2020   Loss of consciousness ([place]) 03/26/2019   Essential hypertension 03/26/2019   Actinic keratoses 12/20/2018   Elevated systolic blood pressure reading without diagnosis of hypertension 11/16/2018   UTI (urinary tract infection) 10/08/2018   Adynamic ileus ([place]) 10/08/2018   Diarrhea 10/05/2018   GERD (gastroesophageal reflux disease) 10/05/2018   Urinary hesitancy 10/05/2018   Advanced care planning/counseling discussion 10/05/2018   Hyperlipidemia 09/19/2018   Gait abnormality 09/17/2018   Macular degeneration 09/17/2018    Dry eyes 09/17/2018   History of diverticulitis 09/17/2018   Seizure disorder ([place]) 09/17/2018   Hx of vertebral fracture repair 09/17/2018   Obese 09/17/2018    Past Surgical History:  Procedure Laterality Date   [place]   COLON SURGERY  09/12/2006   FEMUR SURGERY  2018   Broken   TONSILLECTOMY  09/12/1944     OB History   No obstetric history on file.     Family History  Problem Relation Age of Onset   Osteoporosis Mother    Dementia Mother    Arthritis Mother    Stroke Father 29       mini   Dementia Father    CVA Father    Cancer Sister        breast   Dementia Sister        severe   Alzheimer's disease Sister    Diabetes Sister    Hypertension Sister    Stroke Paternal Grandfather        1970    Social History   Tobacco Use   Smoking status: Never   Smokeless tobacco: Never  Vaping Use   Vaping Use: Never used  Substance Use Topics   Alcohol use: Never   Drug use: Never    Home Medications  Prior to Admission medications   Medication Sig Start Date End Date Taking? Authorizing Provider  Cholecalciferol (VITAMIN D-3) 25 MCG (1000 UT) CAPS TAKE 1 CAPSULE DAILY WITH BREAKFAST. 09/24/19   Mast, Man X, NP  ketoconazole (NIZORAL) 2 % shampoo Apply 1 application topically once a week.  04/23/19   [provider]  Multiple Vitamins-Minerals (ONE-A-DAY WOMENS 50 PLUS) TABS Take 1 tablet by mouth daily with breakfast.    [provider]  Multiple Vitamins-Minerals (PRESERVISION/LUTEIN) CAPS Take 1 capsule by mouth daily with breakfast.    [provider]  Polyethyl Glycol-Propyl Glycol (SYSTANE) 0.4-0.3 % SOLN Place 1-2 drops into both eyes 3 (three) times daily as needed (for dryness).     [provider]  Sodium Fluoride (PREVIDENT 5000 BOOSTER PLUS) 1.1 % PSTE Place 1 application onto teeth daily as needed.     [provider]    Allergies     Barbiturates, Cefdinir, Phenobarbital, Tegretol [carbamazepine], and Zonisamide  Review of Systems   Review of Systems  Neurological:  Positive for seizures.  All other systems reviewed and are negative.  Physical Exam Updated Vital Signs BP (!) 169/79   Pulse 89   Temp 98 F (36.7 C) (Oral)   Resp 15   Ht '5\' 3"'$  (1.6 m)   Wt 83.9 kg   SpO2 95%   BMI 32.77 kg/m   Physical Exam Vitals and nursing note reviewed.  Constitutional:      Appearance: Normal appearance.  HENT:     Head: Normocephalic and atraumatic.     Right Ear: External ear normal.     Left Ear: External ear normal.     Nose: Nose normal.     Mouth/Throat:     Mouth: Mucous membranes are moist.     Pharynx: Oropharynx is clear.  Eyes:     Extraocular Movements: Extraocular movements intact.     Conjunctiva/sclera: Conjunctivae normal.     Pupils: Pupils are equal, round, and reactive to light.  Cardiovascular:     Rate and Rhythm: Normal rate and regular rhythm.     Pulses: Normal pulses.     Heart sounds: Normal heart sounds.  Pulmonary:     Effort: Pulmonary effort is normal.     Breath sounds: Normal breath sounds.  Abdominal:     General: Abdomen is flat. Bowel sounds are normal.     Palpations: Abdomen is soft.  Musculoskeletal:        General: Normal range of motion.     Cervical back: Normal range of motion and neck supple.  Skin:    General: Skin is warm.     Capillary Refill: Capillary refill takes less than 2 seconds.  Neurological:     General: No focal deficit present.     Mental Status: She is alert and oriented to person, place, and time.  Psychiatric:        Mood and Affect: Mood normal.        Behavior: Behavior normal.        Thought Content: Thought content normal.        Judgment: Judgment normal.    ED Results / Procedures / Treatments   Labs (all labs ordered are listed, but only abnormal results are displayed) Labs Reviewed  COMPREHENSIVE METABOLIC PANEL - Abnormal;  Notable for the following components:      Result Value   Glucose, Bld 112 (*)    All other components within normal limits  CBC WITH DIFFERENTIAL/PLATELET -  Abnormal; Notable for the following components:   WBC 12.1 (*)    Abs Immature Granulocytes 0.08 (*)    All other components within normal limits  URINALYSIS, ROUTINE W REFLEX MICROSCOPIC - Abnormal; Notable for the following components:   Color, Urine STRAW (*)    All other components within normal limits  CBG MONITORING, ED - Abnormal; Notable for the following components:   Glucose-Capillary 123 (*)    All other components within normal limits  PROTIME-INR    EKG None  Radiology CT HEAD WO CONTRAST  Result Date: 05/19/2021 CLINICAL DATA:  80 year old female with history of seizure. EXAM: CT HEAD WITHOUT CONTRAST TECHNIQUE: Contiguous axial images were obtained from the base of the skull through the vertex without intravenous contrast. COMPARISON:  Head CT 11/05/2019. FINDINGS: Brain: Mild cerebral atrophy. Patchy and confluent areas of decreased attenuation are noted throughout the deep and periventricular white matter of the cerebral hemispheres bilaterally, compatible with chronic microvascular ischemic disease. Well-defined areas of low attenuation in the basal ganglia bilaterally, compatible with old lacunar infarcts. Physiologic calcifications in the basal ganglia bilaterally incidentally noted. No evidence of acute infarction, hemorrhage, hydrocephalus, extra-axial collection or mass lesion/mass effect. Vascular: No hyperdense vessel or unexpected calcification. Skull: Normal. Negative for fracture or focal lesion. Sinuses/Orbits: No acute finding. Other: None. IMPRESSION: 1. No acute intracranial abnormalities. 2. Mild cerebral atrophy with chronic microvascular ischemic changes in the cerebral white matter and old bilateral basal ganglia lacunar infarcts, similar to the prior study, as above. Electronically Signed   By: Vinnie Langton M.D.   On: 05/19/2021 19:11    Procedures Procedures   Medications Ordered in ED Medications  0.9 %  sodium chloride infusion ( Intravenous New Bag/Given 05/19/21 1843)    ED Course  I have reviewed the triage vital signs and the nursing notes.  Pertinent labs & imaging results that were available during my care of the patient were reviewed by me and considered in my medical decision making (see chart for details).    MDM Rules/Calculators/A&P                           Pt does not want to go back on seizure meds.  She said they all just make her super tired.  She said she thinks she had the seizure today because she waited too long to eat.  She is also not interested in seeing a neurologist because she feels they push the drugs too much.  She has not had a seizure in over a year and does not drive, so this is reasonable.  She will f/u with her pcp.  Final Clinical Impression(s) / ED Diagnoses Final diagnoses:  Seizure [facility])    Rx / [place] Orders ED Discharge Orders     None        Isla Pence, MD 05/19/21 2114

## 2021-05-19 NOTE — ED Notes (Signed)
Pt provided ham sand which and cranberry juice.

## 2021-05-19 NOTE — ED Triage Notes (Signed)
Pt arrives via [facility] from [place] for witnessed seizure. Pt has hx of same, last sz approximately one year prior. Not on anti-epileptic drugs. Pt A+Ox4 on arrival to ED

## 2021-05-20 DIAGNOSIS — R569 Unspecified convulsions: Secondary | ICD-10-CM | POA: Diagnosis not present

## 2021-05-20 DIAGNOSIS — I10 Essential (primary) hypertension: Secondary | ICD-10-CM | POA: Diagnosis not present

## 2021-05-20 DIAGNOSIS — Z7401 Bed confinement status: Secondary | ICD-10-CM | POA: Diagnosis not present

## 2021-05-20 DIAGNOSIS — R0902 Hypoxemia: Secondary | ICD-10-CM | POA: Diagnosis not present

## 2021-05-25 DIAGNOSIS — L570 Actinic keratosis: Secondary | ICD-10-CM | POA: Diagnosis not present

## 2021-05-25 DIAGNOSIS — L821 Other seborrheic keratosis: Secondary | ICD-10-CM | POA: Diagnosis not present

## 2021-05-25 DIAGNOSIS — Z85828 Personal history of other malignant neoplasm of skin: Secondary | ICD-10-CM | POA: Diagnosis not present

## 2021-05-25 DIAGNOSIS — L814 Other melanin hyperpigmentation: Secondary | ICD-10-CM | POA: Diagnosis not present

## 2021-05-25 DIAGNOSIS — D2239 Melanocytic nevi of other parts of face: Secondary | ICD-10-CM | POA: Diagnosis not present

## 2021-06-01 DIAGNOSIS — Z23 Encounter for immunization: Secondary | ICD-10-CM | POA: Diagnosis not present

## 2021-06-17 ENCOUNTER — Encounter (INDEPENDENT_AMBULATORY_CARE_PROVIDER_SITE_OTHER): Payer: Medicare Other | Admitting: Ophthalmology

## 2021-06-22 DIAGNOSIS — Z85828 Personal history of other malignant neoplasm of skin: Secondary | ICD-10-CM | POA: Diagnosis not present

## 2021-06-22 DIAGNOSIS — L570 Actinic keratosis: Secondary | ICD-10-CM | POA: Diagnosis not present

## 2021-06-22 DIAGNOSIS — D2239 Melanocytic nevi of other parts of face: Secondary | ICD-10-CM | POA: Diagnosis not present

## 2021-06-30 ENCOUNTER — Other Ambulatory Visit: Payer: Self-pay

## 2021-06-30 ENCOUNTER — Encounter (INDEPENDENT_AMBULATORY_CARE_PROVIDER_SITE_OTHER): Payer: Self-pay | Admitting: Ophthalmology

## 2021-06-30 ENCOUNTER — Ambulatory Visit (INDEPENDENT_AMBULATORY_CARE_PROVIDER_SITE_OTHER): Payer: Medicare Other | Admitting: Ophthalmology

## 2021-06-30 DIAGNOSIS — H353132 Nonexudative age-related macular degeneration, bilateral, intermediate dry stage: Secondary | ICD-10-CM | POA: Diagnosis not present

## 2021-06-30 DIAGNOSIS — H35722 Serous detachment of retinal pigment epithelium, left eye: Secondary | ICD-10-CM

## 2021-06-30 DIAGNOSIS — H353124 Nonexudative age-related macular degeneration, left eye, advanced atrophic with subfoveal involvement: Secondary | ICD-10-CM | POA: Insufficient documentation

## 2021-06-30 DIAGNOSIS — H35721 Serous detachment of retinal pigment epithelium, right eye: Secondary | ICD-10-CM | POA: Diagnosis not present

## 2021-06-30 DIAGNOSIS — H353112 Nonexudative age-related macular degeneration, right eye, intermediate dry stage: Secondary | ICD-10-CM

## 2021-06-30 NOTE — Assessment & Plan Note (Signed)
No signs of CNVM

## 2021-06-30 NOTE — Progress Notes (Signed)
06/30/2021     CHIEF COMPLAINT Patient presents for  Chief Complaint  Patient presents with   Retina Follow Up    6 Mo F/U OU  Pt c/o difficulty reading the newspaper x 2 weeks, OU, OS>OD. Pt sts she dropped her glasses recently, and feels maybe they might be loose. Pt denies ocular pain. Pt reports waviness off and on OU.       HISTORY OF PRESENT ILLNESS: Katherine Obrien is a 80 y.o. female who presents to the clinic today for:   HPI     Retina Follow Up   Patient presents with  Dry AMD.  In both eyes.  This started 6 months ago.  Severity is mild.  Duration of 6 months.  Since onset it is stable. Additional comments: 6 Mo F/U OU  Pt c/o difficulty reading the newspaper x 2 weeks, OU, OS>OD. Pt sts she dropped her glasses recently, and feels maybe they might be loose. Pt denies ocular pain. Pt reports waviness off and on OU.         Comments   6 mos fu OU OCT. Patient states vision is stable and unchanged since last visit. Denies any new floaters or FOL. Pt states "On Sept 7th I was at the dinner table and passed out. I think I had low blow sugar because I had not eaten. I have had seizures starting at 76, the pace has changed. Not having seizures anymore, my last seizure I had I am not sure when.They took me to [facility] emergency room at [facility]."      Last edited by Laurin Coder on 06/30/2021  1:37 PM.      Referring physician: Mast, Man X, NP [address],  [place] [address]  HISTORICAL INFORMATION:   Selected notes from the MEDICAL RECORD NUMBER    Lab Results  Component Value Date   HGBA1C 5.9 (H) 03/19/2020     CURRENT MEDICATIONS: Current Outpatient Medications (Ophthalmic Drugs)  Medication Sig   Polyethyl Glycol-Propyl Glycol (SYSTANE) 0.4-0.3 % SOLN Place 1-2 drops into both eyes 3 (three) times daily as needed (for dryness).    No current facility-administered medications for this visit. (Ophthalmic Drugs)   Current Outpatient  Medications (Other)  Medication Sig   Cholecalciferol (VITAMIN D-3) 25 MCG (1000 UT) CAPS TAKE 1 CAPSULE DAILY WITH BREAKFAST.   ketoconazole (NIZORAL) 2 % shampoo Apply 1 application topically once a week.    Multiple Vitamins-Minerals (ONE-A-DAY WOMENS 50 PLUS) TABS Take 1 tablet by mouth daily with breakfast.   Multiple Vitamins-Minerals (PRESERVISION/LUTEIN) CAPS Take 1 capsule by mouth daily with breakfast.   Sodium Fluoride (PREVIDENT 5000 BOOSTER PLUS) 1.1 % PSTE Place 1 application onto teeth daily as needed.    No current facility-administered medications for this visit. (Other)      REVIEW OF SYSTEMS:    ALLERGIES Allergies  Allergen Reactions   Barbiturates Other (See Comments)    A small amount "overdosed" the patient- "affected my stability and balance" (might have been given at the same as another med?)   Cefdinir Other (See Comments)    A small amount "overdosed" the patient- "affected my stability and balance" (might have been given at the same as another med?)   Phenobarbital Other (See Comments)    A small amount "overdosed" the patient- "affected my stability and balance" (might have been given at the same as another med?)   Tegretol [Carbamazepine] Other (See Comments)  A small amount "overdosed" the patient- "affected my stability and balance" (might have been given at the same as another med?)   Zonisamide Other (See Comments)    Negative reaction    PAST MEDICAL HISTORY Past Medical History:  Diagnosis Date   Arthritis    Cancer [facility])    skin   Cataract 2010   Dr. Alexander Mt, DrJerlyn Ly @ [facility]   Epilepsy ([place]) 08/11/2015   Hyperlipidemia 12/08/2015   Hypertension    Polyp of colon 08/11/2015   Past Surgical History:  Procedure Laterality Date   [place]   COLON SURGERY  09/12/2006   FEMUR SURGERY  2018   Broken   TONSILLECTOMY  09/12/1944    FAMILY HISTORY Family History   Problem Relation Age of Onset   Osteoporosis Mother    Dementia Mother    Arthritis Mother    Stroke Father 58       mini   Dementia Father    CVA Father    Cancer Sister        breast   Dementia Sister        severe   Alzheimer's disease Sister    Diabetes Sister    Hypertension Sister    Stroke Paternal Grandfather        1970    SOCIAL HISTORY Social History   Tobacco Use   Smoking status: Never   Smokeless tobacco: Never  Vaping Use   Vaping Use: Never used  Substance Use Topics   Alcohol use: Never   Drug use: Never         OPHTHALMIC EXAM:  Base Eye Exam     Visual Acuity (ETDRS)       Right Left   Dist cc 20/40 -2 CF at 5'   Dist ph cc NI 20/400    Correction: Glasses         Tonometry (Tonopen, 1:32 PM)       Right Left   Pressure 7 8         Pupils       Pupils Dark Light Shape React APD   Right PERRL 5 3 Round Brisk None   Left PERRL 5 3 Round Brisk None         Visual Fields (Counting fingers)       Left Right     Full   Restrictions Central scotoma          Extraocular Movement       Right Left    Full Full         Neuro/Psych     Oriented x3: Yes   Mood/Affect: Normal         Dilation     Both eyes: 1.0% Mydriacyl, 2.5% Phenylephrine @ 1:32 PM           Slit Lamp and Fundus Exam     External Exam       Right Left   External Normal Normal         Slit Lamp Exam       Right Left   Lids/Lashes Normal Normal   Conjunctiva/Sclera White and quiet White and quiet   Cornea Clear Clear   Anterior Chamber Deep and quiet Deep and quiet   Iris Round and reactive Round and reactive   Lens Posterior chamber intraocular lens Posterior chamber intraocular lens   Anterior Vitreous Normal Normal  Fundus Exam       Right Left   Posterior Vitreous Posterior vitreous detachment Posterior vitreous detachment   Disc Normal Normal   C/D Ratio 0.55 0.6   Macula Retinal pigment epithelial  mottling, Retinal pigment epithelial atrophy, Early age related macular degeneration large drusenoid subfoveal PED., no hemorrhage, no exudates, Soft drusen Retinal pigment epithelial mottling, Retinal pigment epithelial atrophy, Early age related macular degeneration large drusenoid subfoveal PED., no hemorrhage, no exudates, Soft drusen   Vessels Normal Normal   Periphery Normal Normal            IMAGING AND PROCEDURES  Imaging and Procedures for 06/30/21  OCT, Retina - OU - Both Eyes       Right Eye Quality was good. Scan locations included subfoveal. Central Foveal Thickness: 368. Progression has been stable. Findings include abnormal foveal contour.   Left Eye Quality was good. Scan locations included subfoveal. Central Foveal Thickness: 273. Progression has been stable. Findings include abnormal foveal contour, outer retinal atrophy, retinal drusen .   Notes No active CNVM OU, large subfoveal drusenoid soft drusen, OD, large subfoveal, still with good vision OD  OS large drusenoid PED detachment subfoveal noted 7 months prior, have now resolved completely subfoveal into outer retinal atrophy due to photoreceptor layer disruption but no CNVM             ASSESSMENT/PLAN:  Intermediate stage nonexudative age-related macular degeneration of right eye No signs of CNVM  Advanced nonexudative age-related macular degeneration of left eye with subfoveal involvement Atrophy of the outer retina, no sign of CNVM observe  Serous detachment of retinal pigment epithelium of left eye These large drusenoid subfoveal deposits resolved into atrophy  Serous detachment of retinal pigment epithelium of right eye Large subfoveal drusenoid deposit remains OD as of this date     ICD-10-CM   1. Intermediate stage nonexudative age-related macular degeneration of both eyes  H35.3132 OCT, Retina - OU - Both Eyes    2. Intermediate stage nonexudative age-related macular degeneration of  right eye  H35.3112     3. Advanced nonexudative age-related macular degeneration of left eye with subfoveal involvement  H35.3124     4. Serous detachment of retinal pigment epithelium of left eye  H35.722     5. Serous detachment of retinal pigment epithelium of right eye  H35.721       1.  Dry ARMD OU, no specific therapy can be offered beyond the use of AREDS 2 vitamin  2.  Intra visual acuity loss OS secondary to RPE atrophy and drusenoid deposit resolution with atrophy remnants  3.  Follow-up with Dr. Nathanial Rancher for maximum visual potential assessment with refraction as scheduled  Ophthalmic Meds Ordered this visit:  No orders of the defined types were placed in this encounter.      Return in about 6 months (around 12/29/2021) for DILATE OU, COLOR FP, OCT.  There are no Patient Instructions on file for this visit.   Explained the diagnoses, plan, and follow up with the patient and they expressed understanding.  Patient expressed understanding of the importance of proper follow up care.   Clent Demark Aina Rossbach M.D. Diseases & Surgery of the Retina and Vitreous Retina & Diabetic [place] 06/30/21     Abbreviations: M myopia (nearsighted); A astigmatism; H hyperopia (farsighted); P presbyopia; Mrx spectacle prescription;  CTL contact lenses; OD right eye; OS left eye; OU both eyes  XT exotropia; ET esotropia; PEK punctate epithelial keratitis; PEE  punctate epithelial erosions; DES dry eye syndrome; MGD meibomian gland dysfunction; ATs artificial tears; PFAT's preservative free artificial tears; [place] nuclear sclerotic cataract; PSC posterior subcapsular cataract; ERM epi-retinal membrane; PVD posterior vitreous detachment; RD retinal detachment; DM diabetes mellitus; DR diabetic retinopathy; NPDR non-proliferative diabetic retinopathy; PDR proliferative diabetic retinopathy; CSME clinically significant macular edema; DME diabetic macular edema; dbh dot blot hemorrhages; CWS cotton  wool spot; POAG primary open angle glaucoma; C/D cup-to-disc ratio; HVF humphrey visual field; GVF goldmann visual field; OCT optical coherence tomography; IOP intraocular pressure; BRVO Branch retinal vein occlusion; CRVO central retinal vein occlusion; CRAO central retinal artery occlusion; BRAO branch retinal artery occlusion; RT retinal tear; SB scleral buckle; PPV pars plana vitrectomy; VH Vitreous hemorrhage; PRP panretinal laser photocoagulation; IVK intravitreal kenalog; VMT vitreomacular traction; MH Macular hole;  NVD neovascularization of the disc; NVE neovascularization elsewhere; AREDS age related eye disease study; ARMD age related macular degeneration; POAG primary open angle glaucoma; EBMD epithelial/anterior basement membrane dystrophy; ACIOL anterior chamber intraocular lens; IOL intraocular lens; PCIOL posterior chamber intraocular lens; Phaco/IOL phacoemulsification with intraocular lens placement; [place] photorefractive keratectomy; LASIK laser assisted in situ keratomileusis; HTN hypertension; DM diabetes mellitus; COPD chronic obstructive pulmonary disease

## 2021-06-30 NOTE — Assessment & Plan Note (Signed)
Atrophy of the outer retina, no sign of CNVM observe

## 2021-06-30 NOTE — Assessment & Plan Note (Signed)
Large subfoveal drusenoid deposit remains OD as of this date

## 2021-06-30 NOTE — Assessment & Plan Note (Signed)
These large drusenoid subfoveal deposits resolved into atrophy

## 2021-07-02 DIAGNOSIS — H353132 Nonexudative age-related macular degeneration, bilateral, intermediate dry stage: Secondary | ICD-10-CM | POA: Diagnosis not present

## 2021-07-02 DIAGNOSIS — H04123 Dry eye syndrome of bilateral lacrimal glands: Secondary | ICD-10-CM | POA: Diagnosis not present

## 2021-07-02 DIAGNOSIS — Z961 Presence of intraocular lens: Secondary | ICD-10-CM | POA: Diagnosis not present

## 2021-07-02 DIAGNOSIS — H43811 Vitreous degeneration, right eye: Secondary | ICD-10-CM | POA: Diagnosis not present

## 2021-08-17 ENCOUNTER — Encounter: Payer: Self-pay | Admitting: Internal Medicine

## 2021-08-17 NOTE — Progress Notes (Signed)
A user error has taken place: encounter opened in error, closed for administrative reasons.

## 2021-08-24 DIAGNOSIS — L853 Xerosis cutis: Secondary | ICD-10-CM | POA: Diagnosis not present

## 2021-08-24 DIAGNOSIS — L821 Other seborrheic keratosis: Secondary | ICD-10-CM | POA: Diagnosis not present

## 2021-08-24 DIAGNOSIS — L218 Other seborrheic dermatitis: Secondary | ICD-10-CM | POA: Diagnosis not present

## 2021-08-24 DIAGNOSIS — L570 Actinic keratosis: Secondary | ICD-10-CM | POA: Diagnosis not present

## 2021-08-24 DIAGNOSIS — D229 Melanocytic nevi, unspecified: Secondary | ICD-10-CM | POA: Diagnosis not present

## 2021-09-21 DIAGNOSIS — L218 Other seborrheic dermatitis: Secondary | ICD-10-CM | POA: Diagnosis not present

## 2021-09-21 DIAGNOSIS — L814 Other melanin hyperpigmentation: Secondary | ICD-10-CM | POA: Diagnosis not present

## 2021-09-21 DIAGNOSIS — L853 Xerosis cutis: Secondary | ICD-10-CM | POA: Diagnosis not present

## 2021-09-21 DIAGNOSIS — Z85828 Personal history of other malignant neoplasm of skin: Secondary | ICD-10-CM | POA: Diagnosis not present

## 2021-09-21 DIAGNOSIS — L570 Actinic keratosis: Secondary | ICD-10-CM | POA: Diagnosis not present

## 2021-09-21 DIAGNOSIS — D2239 Melanocytic nevi of other parts of face: Secondary | ICD-10-CM | POA: Diagnosis not present

## 2021-09-21 DIAGNOSIS — L821 Other seborrheic keratosis: Secondary | ICD-10-CM | POA: Diagnosis not present

## 2021-10-26 DIAGNOSIS — L821 Other seborrheic keratosis: Secondary | ICD-10-CM | POA: Diagnosis not present

## 2021-10-26 DIAGNOSIS — L570 Actinic keratosis: Secondary | ICD-10-CM | POA: Diagnosis not present

## 2021-10-26 DIAGNOSIS — L853 Xerosis cutis: Secondary | ICD-10-CM | POA: Diagnosis not present

## 2021-10-26 DIAGNOSIS — L820 Inflamed seborrheic keratosis: Secondary | ICD-10-CM | POA: Diagnosis not present

## 2021-12-29 ENCOUNTER — Encounter (INDEPENDENT_AMBULATORY_CARE_PROVIDER_SITE_OTHER): Payer: Medicare Other | Admitting: Ophthalmology

## 2022-01-05 ENCOUNTER — Ambulatory Visit (INDEPENDENT_AMBULATORY_CARE_PROVIDER_SITE_OTHER): Payer: Medicare Other | Admitting: Ophthalmology

## 2022-01-05 ENCOUNTER — Encounter (INDEPENDENT_AMBULATORY_CARE_PROVIDER_SITE_OTHER): Payer: Self-pay | Admitting: Ophthalmology

## 2022-01-05 DIAGNOSIS — H35721 Serous detachment of retinal pigment epithelium, right eye: Secondary | ICD-10-CM | POA: Diagnosis not present

## 2022-01-05 DIAGNOSIS — H353113 Nonexudative age-related macular degeneration, right eye, advanced atrophic without subfoveal involvement: Secondary | ICD-10-CM

## 2022-01-05 DIAGNOSIS — H353112 Nonexudative age-related macular degeneration, right eye, intermediate dry stage: Secondary | ICD-10-CM

## 2022-01-05 DIAGNOSIS — H353132 Nonexudative age-related macular degeneration, bilateral, intermediate dry stage: Secondary | ICD-10-CM | POA: Diagnosis not present

## 2022-01-05 DIAGNOSIS — H353124 Nonexudative age-related macular degeneration, left eye, advanced atrophic with subfoveal involvement: Secondary | ICD-10-CM | POA: Diagnosis not present

## 2022-01-05 NOTE — Progress Notes (Signed)
? ? ?01/05/2022 ? ?  ? ?CHIEF COMPLAINT ?Patient presents for  ?Chief Complaint  ?Patient presents with  ? Macular Degeneration  ? ? ? ? ?HISTORY OF PRESENT ILLNESS: ?Katherine Obrien is a 81 y.o. female who presents to the clinic today for:  ? ?HPI   ?6 mos fu OU oct fp. ?Patient states "My vision is probably a little weaker. I have a little trouble seeing people at a distance. Seeing street signs on the other side side of the street. I stopped driving. I got something from the [facility], although my license is issued until 2025, you had to fill out a 6 page medical report to continue driving just because you had been on record at one point as taking an anti epilepsy medication." ?Patient also expresses complaints of near vision blurriness when playing cards, having trouble distinguishing the difference in cards and other things at near. ? ?Last edited by Laurin Coder on 01/05/2022 11:07 AM.  ?  ? ? ?Referring physician: ?Warden Fillers, MD ?[address] ?[address] ?[place],   [id number] ? ?HISTORICAL INFORMATION:  ? ?Selected notes from the [facility] ?  ? ?Lab Results  ?Component Value Date  ? HGBA1C 5.9 (H) 03/19/2020  ?  ? ?CURRENT MEDICATIONS: ?Current Outpatient Medications (Ophthalmic Drugs)  ?Medication Sig  ? Polyethyl Glycol-Propyl Glycol (SYSTANE) 0.4-0.3 % SOLN Place 1-2 drops into both eyes 3 (three) times daily as needed (for dryness).   ? ?No current facility-administered medications for this visit. (Ophthalmic Drugs)  ? ?Current Outpatient Medications (Other)  ?Medication Sig  ? Cholecalciferol (VITAMIN D-3) 25 MCG (1000 UT) CAPS TAKE 1 CAPSULE DAILY WITH BREAKFAST.  ? ketoconazole (NIZORAL) 2 % shampoo Apply 1 application topically once a week.   ? Multiple Vitamins-Minerals (ONE-A-DAY WOMENS 50 PLUS) TABS Take 1 tablet by mouth daily with breakfast.  ? Multiple Vitamins-Minerals (PRESERVISION/LUTEIN) CAPS Take 1 capsule by mouth daily with breakfast.  ? Sodium Fluoride (PREVIDENT 5000  BOOSTER PLUS) 1.1 % PSTE Place 1 application onto teeth daily as needed.   ? ?No current facility-administered medications for this visit. (Other)  ? ? ? ? ?REVIEW OF SYSTEMS: ?ROS   ?Negative for: Constitutional, Gastrointestinal, Neurological, Skin, Genitourinary, Musculoskeletal, HENT, Endocrine, Cardiovascular, Eyes, Respiratory, Psychiatric, Allergic/Imm, Heme/Lymph ?Last edited by Hurman Horn, MD on 01/05/2022 11:19 AM.  ?  ? ? ? ?ALLERGIES ?Allergies  ?Allergen Reactions  ? Barbiturates Other (See Comments)  ?  A small amount "overdosed" the patient- "affected my stability and balance" (might have been given at the same as another med?)  ? Cefdinir Other (See Comments)  ?  A small amount "overdosed" the patient- "affected my stability and balance" (might have been given at the same as another med?)  ? Phenobarbital Other (See Comments)  ?  A small amount "overdosed" the patient- "affected my stability and balance" (might have been given at the same as another med?)  ? Tegretol [Carbamazepine] Other (See Comments)  ?  A small amount "overdosed" the patient- "affected my stability and balance" (might have been given at the same as another med?)  ? Zonisamide Other (See Comments)  ?  Negative reaction  ? ? ?PAST MEDICAL HISTORY ?Past Medical History:  ?Diagnosis Date  ? Arthritis   ? Cancer [facility])   ? skin  ? Cataract 2010  ? Dr. Alexander Mt, DrJerlyn Ly @ [facility]  ? Epilepsy ([place]) 08/11/2015  ? Hyperlipidemia 12/08/2015  ? Hypertension   ? Polyp of colon  08/11/2015  ? ?Past Surgical History:  ?Procedure Laterality Date  ? ABDOMINAL HYSTERECTOMY  1970  ? APPENDECTOMY  1958  ? BREAST SURGERY  1984  ? COLON SURGERY  09/12/2006  ? FEMUR SURGERY  2018  ? Broken  ? TONSILLECTOMY  09/12/1944  ? ? ?FAMILY HISTORY ?Family History  ?Problem Relation Age of Onset  ? Osteoporosis Mother   ? Dementia Mother   ? Arthritis Mother   ? Stroke Father 37  ?     mini  ? Dementia Father   ? CVA Father   ? Cancer Sister   ?     breast  ?  Dementia Sister   ?     severe  ? Alzheimer's disease Sister   ? Diabetes Sister   ? Hypertension Sister   ? Stroke Paternal Grandfather   ?     1970  ? ? ?SOCIAL HISTORY ?Social History  ? ?Tobacco Use  ? Smoking status: Never  ? Smokeless tobacco: Never  ?Vaping Use  ? Vaping Use: Never used  ?Substance Use Topics  ? Alcohol use: Never  ? Drug use: Never  ? ?  ? ?  ? ?OPHTHALMIC EXAM: ? ?Base Eye Exam   ? ? Visual Acuity (ETDRS)   ? ?   Right Left  ? Dist cc 20/30 -2 20/160  ? Dist ph cc  NI  ? ? Correction: Glasses  ?OS: lights on helps  ? ?  ?  ? ? Tonometry (Tonopen, 11:04 AM)   ? ?   Right Left  ? Pressure 8 6  ? ?  ?  ? ? Pupils   ? ?   Pupils Dark Light APD  ? Right PERRL 5 3 None  ? Left PERRL 5 3 None  ? ?  ?  ? ? Visual Fields   ? ? Counting fingers  ? ?  ?  ? ? Extraocular Movement   ? ?   Right Left  ?  Full Full  ? ?  ?  ? ? Neuro/Psych   ? ? Oriented x3: Yes  ? Mood/Affect: Normal  ? ?  ?  ? ? Dilation   ? ? Both eyes: 1.0% Mydriacyl, 2.5% Phenylephrine @ 11:03 AM  ? ?  ?  ? ?  ? ?Slit Lamp and Fundus Exam   ? ? External Exam   ? ?   Right Left  ? External Normal Normal  ? ?  ?  ? ? Slit Lamp Exam   ? ?   Right Left  ? Lids/Lashes Normal Normal  ? Conjunctiva/Sclera White and quiet White and quiet  ? Cornea Clear Clear  ? Anterior Chamber Deep and quiet Deep and quiet  ? Iris Round and reactive Round and reactive  ? Lens Posterior chamber intraocular lens Posterior chamber intraocular lens  ? Anterior Vitreous Normal Normal  ? ?  ?  ? ? Fundus Exam   ? ?   Right Left  ? Posterior Vitreous Posterior vitreous detachment Posterior vitreous detachment  ? Disc Normal Normal  ? C/D Ratio 0.55 0.6  ? Macula Retinal pigment epithelial mottling, Retinal pigment epithelial atrophy, Early age related macular degeneration large drusenoid subfoveal PED., no hemorrhage, no exudates, Soft drusen Retinal pigment epithelial mottling, Retinal pigment epithelial atrophy, Early age related macular degeneration large  drusenoid subfoveal PED., no hemorrhage, no exudates, Soft drusen  ? Vessels Normal Normal  ? Periphery Normal Normal  ? ?  ?  ? ?  ? ? ?  IMAGING AND PROCEDURES  ?Imaging and Procedures for 01/05/22 ? ?OCT, Retina - OU - Both Eyes   ? ?   ?Right Eye ?Quality was good. Scan locations included subfoveal. Central Foveal Thickness: 270. Progression has been stable. Findings include abnormal foveal contour.  ? ?Left Eye ?Quality was good. Scan locations included subfoveal. Central Foveal Thickness: 231. Progression has been stable. Findings include abnormal foveal contour, outer retinal atrophy, retinal drusen .  ? ?Notes ?No active CNVM OU, much smaller large subfoveal drusenoid deposit OD as compared to 6 months previous OD,still with good vision OD ? ?OS large drusenoid PED detachment subfoveal noted 7 months prior, have now resolved completely subfoveal into outer retinal atrophy due to photoreceptor layer disruption but no CNVM ? ?  ? ?Color Fundus Photography Optos - OU - Both Eyes   ? ?   ?Right Eye ?Disc findings include normal observations. Macula : drusen, geographic atrophy. Vessels : normal observations. Periphery : normal observations.  ? ?Left Eye ?Progression has been stable. Disc findings include normal observations. Macula : geographic atrophy, drusen. Vessels : normal observations. Periphery : normal observations.  ? ?  ? ? ?  ?  ? ?  ?ASSESSMENT/PLAN: ? ?Serous detachment of retinal pigment epithelium of right eye ?Epithelial RPE detachment noted initially September 2021 and increasing in size until 06-30-2021, spontaneous resolution now with decreased height of this region and subfoveal location and preservation of acuity has occurred ? ? ? ?Advanced nonexudative age-related macular degeneration of left eye with subfoveal involvement ?RV in 3 months for consideration of treatment of dry AMD to prevent progression ? ?Advanced nonexudative age-related macular degeneration of right eye without subfoveal  involvement ?RV in 3 months for consideration of treatment of dry AMD to prevent progression  ? ?  ICD-10-CM   ?1. Intermediate stage nonexudative age-related macular degeneration of both eyes  H35.3132 OCT, Re

## 2022-01-05 NOTE — Assessment & Plan Note (Signed)
RV in 3 months for consideration of treatment of dry AMD to prevent progression ?

## 2022-01-05 NOTE — Assessment & Plan Note (Signed)
Epithelial RPE detachment noted initially September 2021 and increasing in size until 06-30-2021, spontaneous resolution now with decreased height of this region and subfoveal location and preservation of acuity has occurred ? ? ?

## 2022-01-28 DIAGNOSIS — Z23 Encounter for immunization: Secondary | ICD-10-CM | POA: Diagnosis not present

## 2022-02-22 DIAGNOSIS — L570 Actinic keratosis: Secondary | ICD-10-CM | POA: Diagnosis not present

## 2022-02-22 DIAGNOSIS — Z85828 Personal history of other malignant neoplasm of skin: Secondary | ICD-10-CM | POA: Diagnosis not present

## 2022-02-22 DIAGNOSIS — D229 Melanocytic nevi, unspecified: Secondary | ICD-10-CM | POA: Diagnosis not present

## 2022-03-24 ENCOUNTER — Emergency Department (HOSPITAL_COMMUNITY): Payer: Medicare Other

## 2022-03-24 ENCOUNTER — Emergency Department (HOSPITAL_COMMUNITY)
Admission: EM | Admit: 2022-03-24 | Discharge: 2022-03-25 | Disposition: A | Discharge status: Skilled Nursing Facility | Payer: Medicare Other | Attending: Emergency Medicine | Admitting: Emergency Medicine

## 2022-03-24 DIAGNOSIS — R55 Syncope and collapse: Secondary | ICD-10-CM | POA: Insufficient documentation

## 2022-03-24 DIAGNOSIS — R569 Unspecified convulsions: Secondary | ICD-10-CM | POA: Diagnosis not present

## 2022-03-24 DIAGNOSIS — R110 Nausea: Secondary | ICD-10-CM | POA: Insufficient documentation

## 2022-03-24 DIAGNOSIS — R4020 Unspecified coma: Secondary | ICD-10-CM | POA: Diagnosis not present

## 2022-03-24 DIAGNOSIS — Z20822 Contact with and (suspected) exposure to covid-19: Secondary | ICD-10-CM | POA: Diagnosis not present

## 2022-03-24 DIAGNOSIS — R4182 Altered mental status, unspecified: Secondary | ICD-10-CM | POA: Diagnosis not present

## 2022-03-24 DIAGNOSIS — R600 Localized edema: Secondary | ICD-10-CM | POA: Insufficient documentation

## 2022-03-24 DIAGNOSIS — D72829 Elevated white blood cell count, unspecified: Secondary | ICD-10-CM | POA: Diagnosis not present

## 2022-03-24 DIAGNOSIS — R000 Tachycardia, unspecified: Secondary | ICD-10-CM | POA: Diagnosis not present

## 2022-03-24 DIAGNOSIS — R197 Diarrhea, unspecified: Secondary | ICD-10-CM | POA: Diagnosis not present

## 2022-03-24 DIAGNOSIS — I10 Essential (primary) hypertension: Secondary | ICD-10-CM | POA: Diagnosis not present

## 2022-03-24 DIAGNOSIS — R404 Transient alteration of awareness: Secondary | ICD-10-CM | POA: Diagnosis not present

## 2022-03-24 LAB — URINALYSIS, ROUTINE W REFLEX MICROSCOPIC
Bilirubin Urine: NEGATIVE
Glucose, UA: NEGATIVE mg/dL
Hgb urine dipstick: NEGATIVE
Ketones, ur: NEGATIVE mg/dL
Nitrite: NEGATIVE
Protein, ur: NEGATIVE mg/dL
Specific Gravity, Urine: 1.009 (ref 1.005–1.030)
pH: 7 (ref 5.0–8.0)

## 2022-03-24 LAB — COMPREHENSIVE METABOLIC PANEL
ALT: 21 U/L (ref 0–44)
AST: 33 U/L (ref 15–41)
Albumin: 3.5 g/dL (ref 3.5–5.0)
Alkaline Phosphatase: 82 U/L (ref 38–126)
Anion gap: 15 (ref 5–15)
BUN: 20 mg/dL (ref 8–23)
CO2: 24 mmol/L (ref 22–32)
Calcium: 9.5 mg/dL (ref 8.9–10.3)
Chloride: 102 mmol/L (ref 98–111)
Creatinine, Ser: 1 mg/dL (ref 0.44–1.00)
GFR, Estimated: 56 mL/min — ABNORMAL LOW (ref >60)
Glucose, Bld: 134 mg/dL — ABNORMAL HIGH (ref 70–99)
Potassium: 3.7 mmol/L (ref 3.5–5.1)
Sodium: 141 mmol/L (ref 135–145)
Total Bilirubin: 0.4 mg/dL (ref 0.3–1.2)
Total Protein: 6.9 g/dL (ref 6.5–8.1)

## 2022-03-24 LAB — CBC WITH DIFFERENTIAL/PLATELET
Abs Immature Granulocytes: 0.07 10*3/uL (ref 0.00–0.07)
Basophils Absolute: 0.1 10*3/uL (ref 0.0–0.1)
Basophils Relative: 0 %
Eosinophils Absolute: 0 10*3/uL (ref 0.0–0.5)
Eosinophils Relative: 0 %
HCT: 41.9 % (ref 36.0–46.0)
Hemoglobin: 13.3 g/dL (ref 12.0–15.0)
Immature Granulocytes: 1 %
Lymphocytes Relative: 6 %
Lymphs Abs: 0.9 10*3/uL (ref 0.7–4.0)
MCH: 28.3 pg (ref 26.0–34.0)
MCHC: 31.7 g/dL (ref 30.0–36.0)
MCV: 89.1 fL (ref 80.0–100.0)
Monocytes Absolute: 0.9 10*3/uL (ref 0.1–1.0)
Monocytes Relative: 6 %
Neutro Abs: 12.8 10*3/uL — ABNORMAL HIGH (ref 1.7–7.7)
Neutrophils Relative %: 87 %
Platelets: 241 10*3/uL (ref 150–400)
RBC: 4.7 MIL/uL (ref 3.87–5.11)
RDW: 14.8 % (ref 11.5–15.5)
WBC: 14.8 10*3/uL — ABNORMAL HIGH (ref 4.0–10.5)
nRBC: 0 % (ref 0.0–0.2)

## 2022-03-24 LAB — TROPONIN I (HIGH SENSITIVITY)
Troponin I (High Sensitivity): 10 ng/L (ref <18)
Troponin I (High Sensitivity): 12 ng/L (ref <18)

## 2022-03-24 LAB — SARS CORONAVIRUS 2 BY RT PCR: SARS Coronavirus 2 by RT PCR: NEGATIVE

## 2022-03-24 MED ORDER — SODIUM CHLORIDE 0.9 % IV BOLUS
1000.0000 mL | Freq: Once | INTRAVENOUS | Status: AC
  Administered 2022-03-24: 1000 mL via INTRAVENOUS

## 2022-03-24 NOTE — ED Provider Notes (Addendum)
[facility] EMERGENCY DEPARTMENT Provider Note   CSN: [id number] Arrival date & time: 03/24/22  1657     History  Chief Complaint  Patient presents with   Altered Mental Status    Katherine Obrien is a 81 y.o. female.  81 year old female with known seizure disorder presents after seizure versus syncopal episode.  Patient states she felt unwell today, had some diarrhea and nausea but no vomiting.  Has not eaten or drank much today. She went to read the newspaper is unsure what happened next.  She was found unresponsive, EMS was called.  Patient appeared to be in a postictal state. Pt states she is feeling better now and that she was exposed to COVID earlier this week.   Pt has not been on seizure medications for over a year. She doesn't like the way they make her feel. Last known seizure was 05/19/21.    Altered Mental Status Associated symptoms: nausea   Associated symptoms: no abdominal pain, no fever, no light-headedness and no vomiting        Home Medications Prior to Admission medications   Medication Sig Start Date End Date Taking? Authorizing Provider  Cholecalciferol (VITAMIN D-3) 25 MCG (1000 UT) CAPS TAKE 1 CAPSULE DAILY WITH BREAKFAST. 09/24/19   Mast, Man X, NP  ketoconazole (NIZORAL) 2 % shampoo Apply 1 application topically once a week.  04/23/19   [provider]  Multiple Vitamins-Minerals (ONE-A-DAY WOMENS 50 PLUS) TABS Take 1 tablet by mouth daily with breakfast.    [provider]  Multiple Vitamins-Minerals (PRESERVISION/LUTEIN) CAPS Take 1 capsule by mouth daily with breakfast.    [provider]  Polyethyl Glycol-Propyl Glycol (SYSTANE) 0.4-0.3 % SOLN Place 1-2 drops into both eyes 3 (three) times daily as needed (for dryness).     [provider]  Sodium Fluoride (PREVIDENT 5000 BOOSTER PLUS) 1.1 % PSTE Place 1 application onto teeth daily as needed.     [provider]      Allergies     Barbiturates, Cefdinir, Phenobarbital, Tegretol [carbamazepine], and Zonisamide    Review of Systems   Review of Systems  Constitutional:  Negative for appetite change, fatigue and fever.  HENT:  Negative for congestion, postnasal drip and sore throat.   Respiratory:  Negative for cough and shortness of breath.   Cardiovascular:  Negative for chest pain.  Gastrointestinal:  Positive for diarrhea and nausea. Negative for abdominal pain and vomiting.  Genitourinary:  Negative for difficulty urinating and dysuria.  Neurological:  Negative for dizziness and light-headedness.    Physical Exam Updated Vital Signs BP (!) 154/77   Pulse 95   Temp 98.4 F (36.9 C)   Resp 16   SpO2 98%  Physical Exam Constitutional:      General: She is not in acute distress.    Appearance: She is not ill-appearing, toxic-appearing or diaphoretic.  Eyes:     Conjunctiva/sclera: Conjunctivae normal.  Cardiovascular:     Rate and Rhythm: Normal rate and regular rhythm.  Pulmonary:     Effort: Pulmonary effort is normal.     Breath sounds: Normal breath sounds.  Abdominal:     General: Bowel sounds are normal. There is no distension.     Palpations: Abdomen is soft.     Tenderness: There is no abdominal tenderness.  Musculoskeletal:     Right lower leg: Edema present.     Left lower leg: Edema present.  Skin:    General: Skin is warm  and dry.  Neurological:     Mental Status: She is alert and oriented to person, place, and time. Mental status is at baseline.     Cranial Nerves: Cranial nerves 2-12 are intact.     Sensory: Sensation is intact.     Coordination: Finger-Nose-Finger Test normal.  Psychiatric:        Mood and Affect: Mood normal.        Behavior: Behavior normal.     ED Results / Procedures / Treatments   Labs (all labs ordered are listed, but only abnormal results are displayed) Labs Reviewed  CBC WITH DIFFERENTIAL/PLATELET - Abnormal; Notable for the following components:       Result Value   WBC 14.8 (*)    Neutro Abs 12.8 (*)    All other components within normal limits  COMPREHENSIVE METABOLIC PANEL - Abnormal; Notable for the following components:   Glucose, Bld 134 (*)    GFR, Estimated 56 (*)    All other components within normal limits  URINALYSIS, ROUTINE W REFLEX MICROSCOPIC - Abnormal; Notable for the following components:   Color, Urine STRAW (*)    Leukocytes,Ua TRACE (*)    Bacteria, UA RARE (*)    All other components within normal limits  SARS CORONAVIRUS 2 BY RT PCR  TROPONIN I (HIGH SENSITIVITY)  TROPONIN I (HIGH SENSITIVITY)    EKG EKG Interpretation  Date/Time:  Thursday March 24 2022 17:09:28 EDT Ventricular Rate:  105 PR Interval:  200 QRS Duration: 89 QT Interval:  343 QTC Calculation: 454 R Axis:   55 Text Interpretation: Sinus tachycardia Consider right atrial enlargement Low voltage, precordial leads RSR' in V1 or V2, probably normal variant Borderline T abnormalities, anterior leads No significant change since last tracing Confirmed by Wandra Arthurs [id number]) on 03/24/2022 5:17:55 PM  Radiology CT HEAD WO CONTRAST (5MM)  Result Date: 03/24/2022 CLINICAL DATA:  Seizure, new-onset, no history of trauma EXAM: CT HEAD WITHOUT CONTRAST TECHNIQUE: Contiguous axial images were obtained from the base of the skull through the vertex without intravenous contrast. RADIATION DOSE REDUCTION: This exam was performed according to the departmental dose-optimization program which includes automated exposure control, adjustment of the mA and/or kV according to patient size and/or use of iterative reconstruction technique. COMPARISON:  05/19/2021 FINDINGS: Brain: Age related volume loss. No acute intracranial abnormality. Specifically, no hemorrhage, hydrocephalus, mass lesion, acute infarction, or significant intracranial injury. Vascular: No hyperdense vessel or unexpected calcification. Skull: No acute calvarial abnormality. Sinuses/Orbits: No acute  findings Other: None IMPRESSION: No acute intracranial abnormality. Electronically Signed   By: Rolm Baptise M.D.   On: 03/24/2022 18:41   DG Chest Port 1 View  Result Date: 03/24/2022 CLINICAL DATA:  Altered mental status EXAM: PORTABLE CHEST 1 VIEW COMPARISON:  03/26/2019 FINDINGS: Transverse diameter of the heart is in upper limits of normal. There are no signs of pulmonary edema or focal pulmonary consolidation. Small linear densities in the lower lung fields may suggest minimal scarring or subsegmental atelectasis. There is no significant pleural effusion or pneumothorax. Degenerative changes are noted in left shoulder with interval progression. IMPRESSION: There are no signs of pulmonary edema or focal pulmonary consolidation. Electronically Signed   By: Elmer Picker M.D.   On: 03/24/2022 18:04    Procedures Procedures  None  Medications Ordered in ED Medications  sodium chloride 0.9 % bolus 1,000 mL (0 mLs Intravenous Stopped 03/24/22 2215)    ED Course/ Medical Decision Making/ A&P  Medical Decision Making 81 year old female with known seizure disorder, not on seizure medications, presents after LOC and unresponsive episode.  Episode could have been seizure activity as patient was described as being in postictal state per EMS however episode was unwitnessed so it is difficult to say.  It is also a possibility patient lost consciousness in response dehydration due to diarrhea and poor p.o. intake.  She received a 1 L bolus NS in the ED.  Head CT showed no acute intracranial abnormality, CXR normal, labs are significant for leukocytosis to 14.8 with ANC of 12.8.  CMP unremarkable, COVID test was negative, UA showed no signs of UTI, trops were low and EKG showed no signs of arrhythmia or MI. Neuro exam is reassuring, patient is alert and oriented x3 and appears to be at her baseline, VSS.  Patient does not require admission and was discharged with a referral to  outpatient neurology instructions follow-up with her PCP within 2 days of discharge.  Amount and/or Complexity of Data Reviewed Labs: ordered. Radiology: ordered. ECG/medicine tests: ordered.     Final Clinical Impression(s) / ED Diagnoses Final diagnoses:  Loss of consciousness ([place])    Rx / DC Orders ED Discharge Orders          Ordered    Ambulatory referral to Neurology       Comments: An appointment is requested in approximately: 8 weeks Pt with PMHx of seizure disorder, not on seizure medications for over 1 year. Last known seizure in 2022. Had unresponsive episode on 03/24/22, syncope vs seizure.   03/24/22 2218              Precious Gilding, DO 03/24/22 2233    Precious Gilding, DO 03/25/22 0004    Drenda Freeze, MD 03/25/22 [phone]

## 2022-03-24 NOTE — Discharge Instructions (Addendum)
Thank you for allowing [place] to care for you during your stay.  Your head imaging and labs all came back unremarkable.  We are unsure if this episode of loss of consciousness you experienced was due to passing out versus a seizure as it was not witnessed.  We recommend you follow-up with neurology and a referral to outpatient neurology has been placed.  They will call you to schedule an appointment.

## 2022-03-24 NOTE — ED Notes (Signed)
Jeneen Rinks (Son) would like to be called and updated on status of pt. Phone is [phone]

## 2022-03-24 NOTE — ED Triage Notes (Signed)
Pt from independent side of [facility] via [place] for eval of unresponsive episode for approx 58mn. On EMS arrival, pt appeared post-ictal, non-verbal, slow to return to A&O4. Reported sitting at table, went unresponsive. Hx seizures, unmedicated [id number] reports SHOB, nausea, covid exposure recently.   20g L hand  Tachy in 120's to 110's BP 194/64 to 178/76

## 2022-03-25 DIAGNOSIS — Z7401 Bed confinement status: Secondary | ICD-10-CM | POA: Diagnosis not present

## 2022-03-25 DIAGNOSIS — R531 Weakness: Secondary | ICD-10-CM | POA: Diagnosis not present

## 2022-04-06 ENCOUNTER — Encounter (INDEPENDENT_AMBULATORY_CARE_PROVIDER_SITE_OTHER): Payer: Medicare Other | Admitting: Ophthalmology

## 2022-04-06 ENCOUNTER — Encounter: Payer: Self-pay | Admitting: Family

## 2022-04-06 ENCOUNTER — Ambulatory Visit (INDEPENDENT_AMBULATORY_CARE_PROVIDER_SITE_OTHER): Payer: Medicare Other | Admitting: Family

## 2022-04-06 VITALS — BP 100/68 | HR 100 | Temp 97.7°F | Resp 16 | Ht 63.0 in | Wt 207.2 lb

## 2022-04-06 DIAGNOSIS — I10 Essential (primary) hypertension: Secondary | ICD-10-CM | POA: Diagnosis not present

## 2022-04-06 DIAGNOSIS — D72829 Elevated white blood cell count, unspecified: Secondary | ICD-10-CM | POA: Diagnosis not present

## 2022-04-06 NOTE — Progress Notes (Signed)
Provider: Marlowe Sax FNP-C  Mast, Man X, NP  Patient Care Team: Mast, Man X, NP as PCP - General (Internal Medicine) Rosita Fire, MD as Referring Physician (Neurology) Dorothe Pea, Jeri Lager, MD as Referring Physician (Dermatology)  Extended Emergency Contact Information Primary Emergency Contact: Loftin,James Address: [place] [address] [place] Phone: [phone] Relation: Son Secondary Emergency Contact: Anthes,Caroline Address: [place] [address] [place] Phone: [phone] Work Phone: [phone] Mobile Phone: [phone] Relation: Daughter  Code Status: Full Code  Goals of care: Advanced Directive information    04/06/2022    1:38 PM  Advanced Directives  Does Patient Have a Medical Advance Directive? Yes  Type of Paramedic of [place];Living will;Out of facility DNR (pink MOST or yellow form)  Does patient want to make changes to medical advance directive? No - Patient declined  Copy of [place] in Chart? Yes - validated most recent copy scanned in chart (See row information)     Chief Complaint  Patient presents with   Transitions Of Care    [facility] 03/24/2022-03/25/2022 for Loss of consciousness.     HPI:  Pt is a 81 y.o. female seen today for an acute visit for transition of care post Hospitalization from 03/24/2022 - 03/25/2022 for loss of consciousness.  She was seen in the ED and was started had possible seizure activity as she was reported being in postictal state but not witnessed by EMS.  It was also thought possible loss consciousness due to dehydration secondary to diarrhea and poor oral intake.  She was treated with a 1 L bolus of normal saline in the ED.  Had a head CT scan which showed no acute intracranial abnormality.  Checks x-ray was normal.  Labs results indicated leukocytosis 14.8 with ANC of 12.8 CMP  was normal her COVID test done was negative.  Troponins were normal urine analysis showed no signs of urinary tract infection.  EKG was normal her neuro exam was normal and continued to be alert and oriented at baseline/she was not admitted and was discharged with a referral to outpatient neurology and to follow-up with PCP within 2 days of discharge. She denies any acute issues during this visit.  No other seizure activity reported   Past Medical History:  Diagnosis Date   Arthritis    Cancer [facility])    skin   Cataract 2010   Dr. Alexander [place], Dr.. Jerlyn Ly @ [facility]   Epilepsy [facility]) 08/11/2015   Hyperlipidemia 12/08/2015   Hypertension    Polyp of colon 08/11/2015   Past Surgical History:  Procedure Laterality Date   [place]   COLON SURGERY  09/12/2006   FEMUR SURGERY  2018   Broken   TONSILLECTOMY  09/12/1944    Allergies  Allergen Reactions   Barbiturates Other (See Comments)    A small amount "overdosed" the patient- "affected my stability and balance" (might have been given at the same as another med?)   Cefdinir Other (See Comments)    A small amount "overdosed" the patient- "affected my stability and balance" (might have been given at the same as another med?)   Phenobarbital Other (See Comments)    A small amount "overdosed" the patient- "affected my stability and balance" (  might have been given at the same as another med?)   Tegretol [Carbamazepine] Other (See Comments)    A small amount "overdosed" the patient- "affected my stability and balance" (might have been given at the same as another med?)   Zonisamide Other (See Comments)    Negative reaction    Outpatient Encounter Medications as of 04/06/2022  Medication Sig   Cholecalciferol (VITAMIN D-3) 25 MCG (1000 UT) CAPS TAKE 1 CAPSULE DAILY WITH BREAKFAST.   Multiple Vitamins-Minerals (ONE-A-DAY WOMENS 50 PLUS) TABS Take 1 tablet by mouth daily with breakfast.    Multiple Vitamins-Minerals (PRESERVISION/LUTEIN) CAPS Take 1 capsule by mouth daily with breakfast.   Polyethyl Glycol-Propyl Glycol (SYSTANE) 0.4-0.3 % SOLN Place 1-2 drops into both eyes 3 (three) times daily as needed (for dryness).    Sodium Fluoride (PREVIDENT 5000 BOOSTER PLUS) 1.1 % PSTE Place 1 application onto teeth daily as needed.    ketoconazole (NIZORAL) 2 % shampoo Apply 1 application topically once a week.    No facility-administered encounter medications on file as of 04/06/2022.    Review of Systems  Constitutional:  Negative for appetite change, chills, fatigue, fever and unexpected weight change.  HENT:  Negative for congestion, dental problem, ear discharge, ear pain, facial swelling, hearing loss, nosebleeds, postnasal drip, rhinorrhea, sinus pressure, sinus pain, sneezing, sore throat, tinnitus and trouble swallowing.   Eyes:  Positive for visual disturbance. Negative for pain, discharge, redness and itching.       Wears eye glasses   Respiratory:  Negative for cough, chest tightness, shortness of breath and wheezing.   Cardiovascular:  Negative for chest pain, palpitations and leg swelling.  Gastrointestinal:  Negative for abdominal distention, abdominal pain, blood in stool, constipation, diarrhea, nausea and vomiting.  Endocrine: Negative for cold intolerance, heat intolerance, polydipsia, polyphagia and polyuria.  Genitourinary:  Negative for difficulty urinating, dysuria, flank pain, frequency and urgency.  Musculoskeletal:  Positive for gait problem. Negative for arthralgias, back pain, joint swelling, myalgias, neck pain and neck stiffness.  Skin:  Negative for color change, pallor, rash and wound.  Neurological:  Negative for dizziness, syncope, speech difficulty, weakness, light-headedness, numbness and headaches.  Hematological:  Does not bruise/bleed easily.  Psychiatric/Behavioral:  Negative for agitation, behavioral problems, confusion, hallucinations,  self-injury, sleep disturbance and suicidal ideas. The patient is not nervous/anxious.     Immunization History  Administered Date(s) Administered   Fluad Quad(high Dose 65+) 07/03/2020, 06/28/2021   Influenza, High Dose Seasonal PF 06/26/2019   Influenza-Unspecified 06/29/2015, 05/21/2016   Moderna Sars-Covid-2 Vaccination 09/16/2019, 10/14/2019   Pneumococcal Conjugate-13 01/19/2014   Pneumococcal Polysaccharide-23 07/28/2009   Td 07/25/2006   Tdap 12/22/2015   Zoster Recombinat (Shingrix) 05/27/2017, 11/24/2017   Pertinent  Health Maintenance Due  Topic Date Due   INFLUENZA VACCINE  04/12/2022   DEXA SCAN  Completed      11/05/2019   12:19 PM 02/27/2020    1:04 PM 05/19/2021    6:38 PM 05/20/2021    6:55 AM 04/06/2022    1:38 PM  Fall Risk  Falls in the past year?  0   0  Was there an injury with Fall?     0  Fall Risk Category Calculator     0  Fall Risk Category     Low  Patient Fall Risk Level High fall risk  Moderate fall risk Low fall risk Low fall risk  Patient at Risk for Falls Due to     No Fall Risks  Fall risk  Follow up     Falls evaluation completed   Functional Status Survey:    Vitals:   04/06/22 1331  BP: 100/68  Pulse: 100  Resp: 16  Temp: 97.7 F (36.5 C)  SpO2: 95%  Weight: 207 lb 3.2 oz (94 kg)  Height: _0  (1.6 m)   Body mass index is 36.7 kg/m. Physical Exam Vitals reviewed.  Constitutional:      General: She is not in acute distress.    Appearance: Normal appearance. She is normal weight. She is not ill-appearing or diaphoretic.  HENT:     Head: Normocephalic.     Right Ear: Tympanic membrane, ear canal and external ear normal. There is no impacted cerumen.     Left Ear: Tympanic membrane, ear canal and external ear normal. There is no impacted cerumen.     Nose: Nose normal. No congestion or rhinorrhea.     Mouth/Throat:     Mouth: Mucous membranes are moist.     Pharynx: Oropharynx is clear. No oropharyngeal exudate or posterior  oropharyngeal erythema.  Eyes:     General: No scleral icterus.       Right eye: No discharge.        Left eye: No discharge.     Extraocular Movements: Extraocular movements intact.     Conjunctiva/sclera: Conjunctivae normal.     Pupils: Pupils are equal, round, and reactive to light.  Neck:     Vascular: No carotid bruit.  Cardiovascular:     Rate and Rhythm: Normal rate and regular rhythm.     Pulses: Normal pulses.     Heart sounds: Normal heart sounds. No murmur heard.    No friction rub. No gallop.  Pulmonary:     Effort: Pulmonary effort is normal. No respiratory distress.     Breath sounds: Normal breath sounds. No wheezing, rhonchi or rales.  Chest:     Chest wall: No tenderness.  Abdominal:     General: Bowel sounds are normal. There is no distension.     Palpations: Abdomen is soft. There is no mass.     Tenderness: There is no abdominal tenderness. There is no right CVA tenderness, left CVA tenderness, guarding or rebound.  Musculoskeletal:        General: No swelling or tenderness. Normal range of motion.     Cervical back: Normal range of motion. No rigidity or tenderness.     Right lower leg: No edema.     Left lower leg: No edema.  Lymphadenopathy:     Cervical: No cervical adenopathy.  Skin:    General: Skin is warm and dry.     Coloration: Skin is not pale.     Findings: No bruising, erythema, lesion or rash.  Neurological:     Mental Status: She is alert and oriented to person, place, and time.     Cranial Nerves: No cranial nerve deficit.     Sensory: No sensory deficit.     Motor: No weakness.     Coordination: Coordination normal.     Gait: Gait abnormal.  Psychiatric:        Mood and Affect: Mood normal.        Speech: Speech normal.        Behavior: Behavior normal.        Thought Content: Thought content normal.        Judgment: Judgment normal.     Labs reviewed: Recent Labs    05/19/21 1830 03/24/22 1710  NA 138 141  K 3.6 3.7  CL  102 102  CO2 25 24  GLUCOSE 112* 134*  BUN 19 20  CREATININE 0.95 1.00  CALCIUM 8.9 9.5   Recent Labs    05/19/21 1830 03/24/22 1710  AST 21 33  ALT 18 21  ALKPHOS 83 82  BILITOT 0.4 0.4  PROT 7.2 6.9  ALBUMIN 3.7 3.5   Recent Labs    05/19/21 1830 03/24/22 1710  WBC 12.1* 14.8*  NEUTROABS 7.4 12.8*  HGB 13.1 13.3  HCT 42.1 41.9  MCV 91.3 89.1  PLT 272 241   Lab Results  Component Value Date   TSH 1.601 03/26/2019   Lab Results  Component Value Date   HGBA1C 5.9 (H) 03/19/2020   Lab Results  Component Value Date   CHOL 172 12/24/2018   HDL 49 (L) 12/24/2018   LDLCALC 104 (H) 12/24/2018   TRIG 99 12/24/2018   CHOLHDL 3.5 12/24/2018    Significant Diagnostic Results in last 30 days:  CT HEAD WO CONTRAST (5MM)  Result Date: 03/24/2022 CLINICAL DATA:  Seizure, new-onset, no history of trauma EXAM: CT HEAD WITHOUT CONTRAST TECHNIQUE: Contiguous axial images were obtained from the base of the skull through the vertex without intravenous contrast. RADIATION DOSE REDUCTION: This exam was performed according to the departmental dose-optimization program which includes automated exposure control, adjustment of the mA and/or kV according to patient size and/or use of iterative reconstruction technique. COMPARISON:  05/19/2021 FINDINGS: Brain: Age related volume loss. No acute intracranial abnormality. Specifically, no hemorrhage, hydrocephalus, mass lesion, acute infarction, or significant intracranial injury. Vascular: No hyperdense vessel or unexpected calcification. Skull: No acute calvarial abnormality. Sinuses/Orbits: No acute findings Other: None IMPRESSION: No acute intracranial abnormality. Electronically Signed   By: Rolm Baptise M.D.   On: 03/24/2022 18:41   DG Chest Port 1 View  Result Date: 03/24/2022 CLINICAL DATA:  Altered mental status EXAM: PORTABLE CHEST 1 VIEW COMPARISON:  03/26/2019 FINDINGS: Transverse diameter of the heart is in upper limits of normal.  There are no signs of pulmonary edema or focal pulmonary consolidation. Small linear densities in the lower lung fields may suggest minimal scarring or subsegmental atelectasis. There is no significant pleural effusion or pneumothorax. Degenerative changes are noted in left shoulder with interval progression. IMPRESSION: There are no signs of pulmonary edema or focal pulmonary consolidation. Electronically Signed   By: Elmer Picker M.D.   On: 03/24/2022 18:04    Assessment/Plan 1. Leukocytosis, unspecified type Recent WBC 14.8 with ANC 12.8 will recheck today. Afebrile  - CBC with Differential/Platelet; Future  2. Essential hypertension Blood pressure well controlled - BMP with eGFR([facility]); Future  Family/ staff Communication: Reviewed plan of care with patient verbalized understanding  Labs/tests ordered:  - CBC with Differential/Platelet; Future - BMP with eGFR([facility]); Future  Next Appointment: Return in about 1 month (around 05/07/2022) for medical mangement of chronic issues with Dr.Gupta in [facility]. lab work in one week or sooner .   Sandrea Hughs, NP

## 2022-04-12 ENCOUNTER — Other Ambulatory Visit: Payer: Medicare Other

## 2022-04-12 DIAGNOSIS — D72829 Elevated white blood cell count, unspecified: Secondary | ICD-10-CM | POA: Diagnosis not present

## 2022-04-12 DIAGNOSIS — I10 Essential (primary) hypertension: Secondary | ICD-10-CM | POA: Diagnosis not present

## 2022-04-12 LAB — CBC WITH DIFFERENTIAL/PLATELET
Absolute Monocytes: 725 cells/uL (ref 200–950)
Basophils Absolute: 56 cells/uL (ref 0–200)
Basophils Relative: 0.6 %
Eosinophils Absolute: 391 cells/uL (ref 15–500)
Eosinophils Relative: 4.2 %
HCT: 40.7 % (ref 35.0–45.0)
Hemoglobin: 13.2 g/dL (ref 11.7–15.5)
Lymphs Abs: 2716 cells/uL (ref 850–3900)
MCH: 28.1 pg (ref 27.0–33.0)
MCHC: 32.4 g/dL (ref 32.0–36.0)
MCV: 86.8 fL (ref 80.0–100.0)
MPV: 9.8 fL (ref 7.5–12.5)
Monocytes Relative: 7.8 %
Neutro Abs: 5413 cells/uL (ref 1500–7800)
Neutrophils Relative %: 58.2 %
Platelets: 300 10*3/uL (ref 140–400)
RBC: 4.69 10*6/uL (ref 3.80–5.10)
RDW: 13.2 % (ref 11.0–15.0)
Total Lymphocyte: 29.2 %
WBC: 9.3 10*3/uL (ref 3.8–10.8)

## 2022-04-12 LAB — BASIC METABOLIC PANEL WITH GFR
BUN/Creatinine Ratio: 19 (calc) (ref 6–22)
BUN: 19 mg/dL (ref 7–25)
CO2: 26 mmol/L (ref 20–32)
Calcium: 9.4 mg/dL (ref 8.6–10.4)
Chloride: 102 mmol/L (ref 98–110)
Creat: 1.01 mg/dL — ABNORMAL HIGH (ref 0.60–0.95)
Glucose, Bld: 104 mg/dL — ABNORMAL HIGH (ref 65–99)
Potassium: 3.8 mmol/L (ref 3.5–5.3)
Sodium: 139 mmol/L (ref 135–146)
eGFR: 56 mL/min/{1.73_m2} — ABNORMAL LOW (ref >=60)

## 2022-04-20 ENCOUNTER — Encounter (INDEPENDENT_AMBULATORY_CARE_PROVIDER_SITE_OTHER): Payer: Self-pay | Admitting: Ophthalmology

## 2022-04-20 ENCOUNTER — Ambulatory Visit (INDEPENDENT_AMBULATORY_CARE_PROVIDER_SITE_OTHER): Payer: Medicare Other | Admitting: Ophthalmology

## 2022-04-20 DIAGNOSIS — H353113 Nonexudative age-related macular degeneration, right eye, advanced atrophic without subfoveal involvement: Secondary | ICD-10-CM | POA: Diagnosis not present

## 2022-04-20 DIAGNOSIS — H35721 Serous detachment of retinal pigment epithelium, right eye: Secondary | ICD-10-CM | POA: Diagnosis not present

## 2022-04-20 DIAGNOSIS — H353132 Nonexudative age-related macular degeneration, bilateral, intermediate dry stage: Secondary | ICD-10-CM | POA: Diagnosis not present

## 2022-04-20 DIAGNOSIS — H353124 Nonexudative age-related macular degeneration, left eye, advanced atrophic with subfoveal involvement: Secondary | ICD-10-CM

## 2022-04-20 NOTE — Assessment & Plan Note (Signed)
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  No sinus CNVM

## 2022-04-20 NOTE — Assessment & Plan Note (Signed)
Stable overall atrophy essentially limits acuityThe nature of dry age related macular degeneration was discussed with the patient as well as its possible conversion to wet. The results of the AREDS 2 study was discussed with the patient. A diet rich in dark leafy green vegetables was advised and specific recommendations were made regarding supplements with AREDS 2 formulation . Control of hypertension and serum cholesterol may slow the disease. Smoking cessation is mandatory to slow the disease and diminish the risk of progressing to wet age related macular degeneration. The patient was instructed in the use of an [place] and was told to return immediately for any changes in the Grid. Stressed to the patient do not rub eyes  Two forms of dry ARMD exist, one with drusen deposits, yellowish deposits with no obvious atrophy,  ATROPHIC which is the loss of normal blood supply to the choroid (the orange or copper green color layer of the outer retina), which is seen as large areas of atrophy which grow over time to decrease vision.  This atrophic form of dry ARMD is treatable as of Fall 2023.  The treatment consists of injection into the affected eye monthly at first, in attempt to slow the growth of the atrophy. The first approved medication is SYFOVRE (pegcetacoplan).

## 2022-04-20 NOTE — Progress Notes (Signed)
04/20/2022     CHIEF COMPLAINT Patient presents for  Chief Complaint  Patient presents with   Macular Degeneration      HISTORY OF PRESENT ILLNESS: Katherine Obrien is a 81 y.o. female who presents to the clinic today for:   HPI   3 months Dilate ou oct, follow-up for large subfoveal pigment epithelial detachment right eye Pt states her vision has been stable Pt denies any floaters or FOL  Last edited by Hurman Horn, MD on 04/20/2022 10:38 AM.      Referring physician: Mast, Man X, NP [address],  [place] [id number]  HISTORICAL INFORMATION:   Selected notes from the MEDICAL RECORD NUMBER    Lab Results  Component Value Date   HGBA1C 5.9 (H) 03/19/2020     CURRENT MEDICATIONS: Current Outpatient Medications (Ophthalmic Drugs)  Medication Sig   Polyethyl Glycol-Propyl Glycol (SYSTANE) 0.4-0.3 % SOLN Place 1-2 drops into both eyes 3 (three) times daily as needed (for dryness).    No current facility-administered medications for this visit. (Ophthalmic Drugs)   Current Outpatient Medications (Other)  Medication Sig   Cholecalciferol (VITAMIN D-3) 25 MCG (1000 UT) CAPS TAKE 1 CAPSULE DAILY WITH BREAKFAST.   ketoconazole (NIZORAL) 2 % shampoo Apply 1 application topically once a week.    Multiple Vitamins-Minerals (ONE-A-DAY WOMENS 50 PLUS) TABS Take 1 tablet by mouth daily with breakfast.   Multiple Vitamins-Minerals (PRESERVISION/LUTEIN) CAPS Take 1 capsule by mouth daily with breakfast.   Sodium Fluoride (PREVIDENT 5000 BOOSTER PLUS) 1.1 % PSTE Place 1 application onto teeth daily as needed.    No current facility-administered medications for this visit. (Other)      REVIEW OF SYSTEMS: ROS   Negative for: Constitutional, Gastrointestinal, Neurological, Skin, Genitourinary, Musculoskeletal, HENT, Endocrine, Cardiovascular, Eyes, Respiratory, Psychiatric, Allergic/Imm, Heme/Lymph Last edited by Morene Rankins, CMA on 04/20/2022 10:17 AM.        ALLERGIES Allergies  Allergen Reactions   Barbiturates Other (See Comments)    A small amount "overdosed" the patient- "affected my stability and balance" (might have been given at the same as another med?)   Cefdinir Other (See Comments)    A small amount "overdosed" the patient- "affected my stability and balance" (might have been given at the same as another med?)   Phenobarbital Other (See Comments)    A small amount "overdosed" the patient- "affected my stability and balance" (might have been given at the same as another med?)   Tegretol [Carbamazepine] Other (See Comments)    A small amount "overdosed" the patient- "affected my stability and balance" (might have been given at the same as another med?)   Zonisamide Other (See Comments)    Negative reaction    PAST MEDICAL HISTORY Past Medical History:  Diagnosis Date   Arthritis    Cancer [facility])    skin   Cataract 2010   Dr. Alexander Mt, DrJerlyn Ly @ [facility]   Epilepsy [facility]) 08/11/2015   Hyperlipidemia 12/08/2015   Hypertension    Polyp of colon 08/11/2015   Past Surgical History:  Procedure Laterality Date   ABDOMINAL HYSTERECTOMY  [place]  09/12/2006   FEMUR SURGERY  2018   Broken   TONSILLECTOMY  09/12/1944    FAMILY HISTORY Family History  Problem Relation Age of Onset   Osteoporosis Mother    Dementia Mother    Arthritis Mother  Stroke Father 20       mini   Dementia Father    CVA Father    Cancer Sister        breast   Dementia Sister        severe   Alzheimer's disease Sister    Diabetes Sister    Hypertension Sister    Stroke Paternal Grandfather        1970    SOCIAL HISTORY Social History   Tobacco Use   Smoking status: Never   Smokeless tobacco: Never  Vaping Use   Vaping Use: Never used  Substance Use Topics   Alcohol use: Never   Drug use: Never         OPHTHALMIC EXAM:  Base Eye Exam     Visual Acuity (ETDRS)        Right Left   Dist ph Nettie 20/40 20/100    Correction: Glasses         Tonometry (Tonopen, 10:23 AM)       Right Left   Pressure 10 20         Pupils       Pupils Shape   Right PERRL Round   Left PERRL          Extraocular Movement       Right Left    Ortho Ortho    -- -- --  --  --  -- -- --   -- -- --  --  --  -- -- --           Neuro/Psych     Oriented x3: Yes   Mood/Affect: Normal         Dilation     Both eyes: 1.0% Mydriacyl, 2.5% Phenylephrine @ 10:18 AM           Slit Lamp and Fundus Exam     External Exam       Right Left   External Normal Normal         Slit Lamp Exam       Right Left   Lids/Lashes Normal Normal   Conjunctiva/Sclera White and quiet White and quiet   Cornea Clear Clear   Anterior Chamber Deep and quiet Deep and quiet   Iris Round and reactive Round and reactive   Lens Posterior chamber intraocular lens Posterior chamber intraocular lens   Anterior Vitreous Normal Normal         Fundus Exam       Right Left   Posterior Vitreous Posterior vitreous detachment Posterior vitreous detachment   Disc Normal Normal   C/D Ratio 0.55 0.6   Macula Retinal pigment epithelial mottling, Retinal pigment epithelial atrophy, Early age related macular degeneration large drusenoid subfoveal PED.  Noted in the past is now collapsed and is now flatter in the macula, no hemorrhage, no exudates, Soft drusen Retinal pigment epithelial mottling, Retinal pigment epithelial atrophy, Early age related macular degeneration large drusenoid subfoveal PED., no hemorrhage, no exudates, Soft drusen   Vessels Normal Normal   Periphery Normal Normal            IMAGING AND PROCEDURES  Imaging and Procedures for 04/20/22  OCT, Retina - OU - Both Eyes       Right Eye Quality was good. Scan locations included subfoveal. Central Foveal Thickness: 265. Progression has improved. Findings include abnormal foveal contour.   Left  Eye Quality was good. Scan locations included subfoveal. Central Foveal Thickness: 233. Progression has been  stable. Findings include abnormal foveal contour, retinal drusen , outer retinal atrophy.   Notes No active CNVM OU, much smaller large subfoveal drusenoid deposit OD as compared to 9 months previous OD,still with good vision OD and with some areas of RPE and choriocapillaris and atrophy in the fovea.  OS large drusenoid PED detachment subfoveal noted 7 months prior, have now resolved completely subfoveal into outer retinal atrophy due to photoreceptor layer disruption but no CNVM              ASSESSMENT/PLAN:  Advanced nonexudative age-related macular degeneration of left eye with subfoveal involvement Stable overall atrophy essentially limits acuityThe nature of dry age related macular degeneration was discussed with the patient as well as its possible conversion to wet. The results of the AREDS 2 study was discussed with the patient. A diet rich in dark leafy green vegetables was advised and specific recommendations were made regarding supplements with AREDS 2 formulation . Control of hypertension and serum cholesterol may slow the disease. Smoking cessation is mandatory to slow the disease and diminish the risk of progressing to wet age related macular degeneration. The patient was instructed in the use of an [place] and was told to return immediately for any changes in the Grid. Stressed to the patient do not rub eyes  Two forms of dry ARMD exist, one with drusen deposits, yellowish deposits with no obvious atrophy,  ATROPHIC which is the loss of normal blood supply to the choroid (the orange or copper green color layer of the outer retina), which is seen as large areas of atrophy which grow over time to decrease vision.  This atrophic form of dry ARMD is treatable as of Fall 2023.  The treatment consists of injection into the affected eye monthly at first, in attempt to slow  the growth of the atrophy. The first approved medication is SYFOVRE (pegcetacoplan).     Advanced nonexudative age-related macular degeneration of right eye without subfoveal involvement OD large subfoveal serous pigment epithelial detachment component continues to weathering with atrophy.  No sinus CNVM  Serous detachment of retinal pigment epithelium of right eye Spontaneous resolution over the last 9 months of large elevated subfoveal PED.  Continue to observe.  Fortunately, atrophy is not affecting the quality of acuity at this time     ICD-10-CM   1. Intermediate stage nonexudative age-related macular degeneration of both eyes  H35.3132 OCT, Retina - OU - Both Eyes    2. Advanced nonexudative age-related macular degeneration of left eye with subfoveal involvement  H35.3124     3. Advanced nonexudative age-related macular degeneration of right eye without subfoveal involvement  H35.3113     4. Serous detachment of retinal pigment epithelium of right eye  H35.721       1.  OU with intermediate AMD.  OS with atrophy slightly with subfoveal accounting for acuity.  2.  No signs of CNVM formation OU.  3.  Spontaneous resolution of subfoveal PED's OU has developed over the last 2 years.  Ophthalmic Meds Ordered this visit:  No orders of the defined types were placed in this encounter.      Return in about 4 months (around 08/20/2022) for DILATE OU, COLOR FP, OCT.  There are no Patient Instructions on file for this visit.   Explained the diagnoses, plan, and follow up with the patient and they expressed understanding.  Patient expressed understanding of the importance of proper follow up care.   Clent Demark Chevella Pearce M.D.  Diseases & Surgery of the Retina and Vitreous [facility] [place] 04/20/22     Abbreviations: M myopia (nearsighted); A astigmatism; H hyperopia (farsighted); P presbyopia; Mrx spectacle prescription;  CTL contact lenses; OD right eye; OS left eye;  OU both eyes  XT exotropia; ET esotropia; PEK punctate epithelial keratitis; PEE punctate epithelial erosions; DES dry eye syndrome; MGD meibomian gland dysfunction; ATs artificial tears; PFAT's preservative free artificial tears; [place] nuclear sclerotic cataract; PSC posterior subcapsular cataract; ERM epi-retinal membrane; PVD posterior vitreous detachment; RD retinal detachment; DM diabetes mellitus; DR diabetic retinopathy; NPDR non-proliferative diabetic retinopathy; PDR proliferative diabetic retinopathy; CSME clinically significant macular edema; DME diabetic macular edema; dbh dot blot hemorrhages; CWS cotton wool spot; POAG primary open angle glaucoma; C/D cup-to-disc ratio; HVF humphrey visual field; GVF goldmann visual field; OCT optical coherence tomography; IOP intraocular pressure; BRVO Branch retinal vein occlusion; CRVO central retinal vein occlusion; CRAO central retinal artery occlusion; BRAO branch retinal artery occlusion; RT retinal tear; SB scleral buckle; PPV pars plana vitrectomy; VH Vitreous hemorrhage; PRP panretinal laser photocoagulation; IVK intravitreal kenalog; VMT vitreomacular traction; MH Macular hole;  NVD neovascularization of the disc; NVE neovascularization elsewhere; AREDS age related eye disease study; ARMD age related macular degeneration; POAG primary open angle glaucoma; EBMD epithelial/anterior basement membrane dystrophy; ACIOL anterior chamber intraocular lens; IOL intraocular lens; PCIOL posterior chamber intraocular lens; Phaco/IOL phacoemulsification with intraocular lens placement; [place] photorefractive keratectomy; LASIK laser assisted in situ keratomileusis; HTN hypertension; DM diabetes mellitus; COPD chronic obstructive pulmonary disease

## 2022-04-20 NOTE — Assessment & Plan Note (Signed)
Spontaneous resolution over the last 9 months of large elevated subfoveal PED.  Continue to observe.  Fortunately, atrophy is not affecting the quality of acuity at this time

## 2022-05-12 ENCOUNTER — Encounter: Payer: Self-pay | Admitting: Nurse Practitioner

## 2022-05-12 ENCOUNTER — Non-Acute Institutional Stay: Payer: Medicare Other | Admitting: Nurse Practitioner

## 2022-05-12 DIAGNOSIS — E785 Hyperlipidemia, unspecified: Secondary | ICD-10-CM | POA: Diagnosis not present

## 2022-05-12 DIAGNOSIS — R3911 Hesitancy of micturition: Secondary | ICD-10-CM | POA: Diagnosis not present

## 2022-05-12 DIAGNOSIS — N183 Chronic kidney disease, stage 3 unspecified: Secondary | ICD-10-CM | POA: Insufficient documentation

## 2022-05-12 DIAGNOSIS — G40909 Epilepsy, unspecified, not intractable, without status epilepticus: Secondary | ICD-10-CM | POA: Diagnosis not present

## 2022-05-12 DIAGNOSIS — N1831 Chronic kidney disease, stage 3a: Secondary | ICD-10-CM | POA: Insufficient documentation

## 2022-05-12 NOTE — Assessment & Plan Note (Signed)
Bun/creat 19/1.01 81/23

## 2022-05-12 NOTE — Assessment & Plan Note (Signed)
The patient stated she sleeps better, bathroom trip only 1x/night.

## 2022-05-12 NOTE — Progress Notes (Signed)
Location:   clinic [place]   Place of Service:  Clinic (12) Provider: Marlana Latus NP  Code Status: DNR Goals of Care: [place]    05/12/2022    9:38 AM  Advanced Directives  Does Patient Have a Medical Advance Directive? Yes  Type of Paramedic of [place];Living will;Out of facility DNR (pink MOST or yellow form)  Does patient want to make changes to medical advance directive? No - Patient declined  Copy of [place] in Chart? Yes - validated most recent copy scanned in chart (See row information)     Chief Complaint  Patient presents with   Medical Management of Chronic Issues    Patient is here for a 86M F/U after discharge from hospital for chronic conditons    HPI: Patient is a 81 y.o. female seen today for medical management of chronic diseases.      Hx of hyperlipidemia, LDL 135 02/25/20, off Atorvastatin Seizure, stable, off Depakote. Declined neurology f/u+ The patient stated she sleeps better, bathroom trip only 1x/night.  CKD Bun/creat 19/1.01 81/23 Past Medical History:  Diagnosis Date   Arthritis    Cancer [facility])    skin   Cataract 2010   Dr. Alexander Mt, Dr.. Jerlyn Ly @ [place]   Epilepsy [facility]) 08/11/2015   Hyperlipidemia 12/08/2015   Hypertension    Polyp of colon 08/11/2015    Past Surgical History:  Procedure Laterality Date   [place]   COLON SURGERY  09/12/2006   FEMUR SURGERY  2018   Broken   TONSILLECTOMY  09/12/1944    Allergies  Allergen Reactions   Barbiturates Other (See Comments)    A small amount "overdosed" the patient- "affected my stability and balance" (might have been given at the same as another med?)   Cefdinir Other (See Comments)    A small amount "overdosed" the patient- "affected my stability and balance" (might have been given at the same as another med?)   Phenobarbital Other (See Comments)    A small amount "overdosed" the patient-  "affected my stability and balance" (might have been given at the same as another med?)   Tegretol [Carbamazepine] Other (See Comments)    A small amount "overdosed" the patient- "affected my stability and balance" (might have been given at the same as another med?)   Zonisamide Other (See Comments)    Negative reaction    Allergies as of 05/12/2022       Reactions   Barbiturates Other (See Comments)   A small amount "overdosed" the patient- "affected my stability and balance" (might have been given at the same as another med?)   Cefdinir Other (See Comments)   A small amount "overdosed" the patient- "affected my stability and balance" (might have been given at the same as another med?)   Phenobarbital Other (See Comments)   A small amount "overdosed" the patient- "affected my stability and balance" (might have been given at the same as another med?)   Tegretol [carbamazepine] Other (See Comments)   A small amount "overdosed" the patient- "affected my stability and balance" (might have been given at the same as another med?)   Zonisamide Other (See Comments)   Negative reaction        Medication List        Accurate as of May 12, 2022 11:59 PM. If you have any questions, ask your nurse or doctor.  ketoconazole 2 % shampoo Commonly known as: NIZORAL Apply 1 application topically once a week.   One-A-Day Womens 50 Plus Tabs Take 1 tablet by mouth daily with breakfast.   PreserVision/Lutein Caps Take 1 capsule by mouth daily with breakfast.   PreviDent 5000 Booster Plus 1.1 % Pste Generic drug: Sodium Fluoride Place 1 application onto teeth daily as needed.   Systane 0.4-0.3 % Soln Generic drug: Polyethyl Glycol-Propyl Glycol Place 1-2 drops into both eyes 3 (three) times daily as needed (for dryness).   Vitamin D-3 25 MCG (1000 UT) Caps TAKE 1 CAPSULE DAILY WITH BREAKFAST.        Review of Systems:  Review of Systems  Constitutional:  Negative for  fatigue, fever and unexpected weight change.  HENT:  Positive for hearing loss. Negative for congestion and voice change.   Respiratory:  Negative for cough, chest tightness, shortness of breath and wheezing.   Cardiovascular:  Positive for leg swelling. Negative for chest pain and palpitations.  Gastrointestinal:  Negative for abdominal distention, abdominal pain, constipation and diarrhea.  Genitourinary:  Negative for difficulty urinating, dysuria and urgency.  Musculoskeletal:  Positive for arthralgias and gait problem.  Skin:  Negative for color change.  Neurological:  Negative for dizziness, seizures, speech difficulty, weakness and headaches.  Psychiatric/Behavioral:  Negative for behavioral problems and sleep disturbance. The patient is not nervous/anxious.     Health Maintenance  Topic Date Due   COVID-19 Vaccine (3 - Moderna risk series) 11/11/2019   INFLUENZA VACCINE  04/12/2022   TETANUS/TDAP  12/21/2025   Pneumonia Vaccine 12+ Years old  Completed   DEXA SCAN  Completed   Zoster Vaccines- Shingrix  Completed   HPV VACCINES  Aged Out    Physical Exam: Vitals:   05/12/22 1416  BP: (!) 143/80  Pulse: 95  Resp: 18  Temp: 98.2 F (36.8 C)  SpO2: 97%  Weight: 204 lb (92.5 kg)  Height: '5\' 3"'$  (1.6 m)   Body mass index is 36.14 kg/m. Physical Exam Vitals and nursing note reviewed.  Constitutional:      Appearance: Normal appearance. She is obese.  HENT:     Head: Normocephalic and atraumatic.     Nose: Nose normal.     Mouth/Throat:     Mouth: Mucous membranes are moist.  Eyes:     Extraocular Movements: Extraocular movements intact.     Conjunctiva/sclera: Conjunctivae normal.     Pupils: Pupils are equal, round, and reactive to light.  Cardiovascular:     Rate and Rhythm: Normal rate and regular rhythm.     Heart sounds: No murmur heard. Pulmonary:     Effort: Pulmonary effort is normal.     Breath sounds: No wheezing, rhonchi or rales.  Abdominal:      General: Bowel sounds are normal.     Palpations: Abdomen is soft.     Tenderness: There is no abdominal tenderness.  Musculoskeletal:     Cervical back: Normal range of motion and neck supple.     Right lower leg: Edema present.     Left lower leg: Edema present.     Comments: Trace edema BLE  Skin:    General: Skin is warm and dry.  Neurological:     General: No focal deficit present.     Mental Status: She is alert and oriented to person, place, and time. Mental status is at baseline.     Motor: No weakness.     Coordination: Coordination normal.  Gait: Gait abnormal.  Psychiatric:        Mood and Affect: Mood normal.        Behavior: Behavior normal.        Thought Content: Thought content normal.        Judgment: Judgment normal.     Labs reviewed: Basic Metabolic Panel: Recent Labs    05/19/21 1830 03/24/22 1710 04/12/22 0745  NA 138 141 139  K 3.6 3.7 3.8  CL 102 102 102  CO2 '25 24 26  '$ GLUCOSE 112* 134* 104*  BUN '19 20 19  '$ CREATININE 0.95 1.00 1.01*  CALCIUM 8.9 9.5 9.4   Liver Function Tests: Recent Labs    05/19/21 1830 03/24/22 1710  AST 21 33  ALT 18 21  ALKPHOS 83 82  BILITOT 0.4 0.4  PROT 7.2 6.9  ALBUMIN 3.7 3.5   No results for input(s): "LIPASE", "AMYLASE" in the last 8760 hours. No results for input(s): "AMMONIA" in the last 8760 hours. CBC: Recent Labs    05/19/21 1830 03/24/22 1710 04/12/22 0745  WBC 12.1* 14.8* 9.3  NEUTROABS 7.4 12.8* 5,413  HGB 13.1 13.3 13.2  HCT 42.1 41.9 40.7  MCV 91.3 89.1 86.8  PLT 272 241 300   Lipid Panel: No results for input(s): "CHOL", "HDL", "LDLCALC", "TRIG", "CHOLHDL", "LDLDIRECT" in the last 8760 hours. Lab Results  Component Value Date   HGBA1C 5.9 (H) 03/19/2020    Procedures since last visit: OCT, Retina - OU - Both Eyes  Result Date: 04/20/2022 Right Eye Quality was good. Scan locations included subfoveal. Central Foveal Thickness: 265. Progression has improved. Findings include  abnormal foveal contour. Left Eye Quality was good. Scan locations included subfoveal. Central Foveal Thickness: 233. Progression has been stable. Findings include abnormal foveal contour, retinal drusen , outer retinal atrophy. Notes No active CNVM OU, much smaller large subfoveal drusenoid deposit OD as compared to 9 months previous OD,still with good vision OD and with some areas of RPE and choriocapillaris and atrophy in the fovea. OS large drusenoid PED detachment subfoveal noted 7 months prior, have now resolved completely subfoveal into outer retinal atrophy due to photoreceptor layer disruption but no CNVM   Assessment/Plan  Hyperlipidemia LDL 135 02/25/20, off Atorvastatin  Seizure disorder (HCC)  stable, off Depakote. Declined  neurology follow up  Urinary hesitancy The patient stated she sleeps better, bathroom trip only 1x/night.   CKD (chronic kidney disease) stage 3, GFR 30-59 ml/min (HCC) Bun/creat 19/1.01 81/23   Labs/tests ordered:  none Next appt:  6 months.

## 2022-05-12 NOTE — Assessment & Plan Note (Signed)
LDL 135 02/25/20, off Atorvastatin

## 2022-05-12 NOTE — Assessment & Plan Note (Addendum)
stable, off Depakote. Declined  neurology follow up

## 2022-05-13 ENCOUNTER — Encounter: Payer: Self-pay | Admitting: Nurse Practitioner

## 2022-05-19 ENCOUNTER — Ambulatory Visit (INDEPENDENT_AMBULATORY_CARE_PROVIDER_SITE_OTHER): Payer: Medicare Other | Admitting: Neurology

## 2022-05-19 ENCOUNTER — Encounter: Payer: Self-pay | Admitting: Neurology

## 2022-05-19 VITALS — BP 167/79 | HR 93 | Ht 63.0 in | Wt 207.5 lb

## 2022-05-19 DIAGNOSIS — R4182 Altered mental status, unspecified: Secondary | ICD-10-CM

## 2022-05-19 NOTE — Progress Notes (Unsigned)
[facility]  PATIENT: Katherine Obrien DOB: 1941/05/01  REQUESTING CLINICIAN: Drenda Freeze, MD HISTORY FROM: Patient  REASON FOR VISIT: Seizure like activity/Period of unresponsiveness    HISTORICAL  CHIEF COMPLAINT:  Chief Complaint  Patient presents with   New Patient (Initial Visit)    Rm 12. Alone. NP internal referral for Unresponsive episode, PMHx of seizure disorder.    HISTORY OF PRESENT ILLNESS:  This is a 81 year old woman past medical history of seizures in her 61s, hypertension, hyperlipidemia who is presenting after an episode of altered mental status/syncope/unresponsiveness.  Patient reports on July 13, she was heading to the dining room, at that time she noted that she was very hungry, she sat on the table and did blackout.  She states she was unresponsive and when someone was asking her a question, she was aware but was not able to answer.  Due to that reason she was taken to the hospital.  In the ED work-up was negative. She does report a history of seizures that have started in the 30s after starting a birth control pill.  Patient report having seizures until menopause and since then she has not had seizures, her last seizure was this more than 30 years.  She denies any injury from that incident, and since then has not reported any additional history. She does report a history of syncope when she is hungry or at the site of blood, likely vasovagal in the past.     Handedness: Right   Onset: In her 107s   Seizure Type: used to have generalized but stopped after menopause   Current frequency: Last reported seizure after menopause  Any injuries from seizures: Denies   Seizure risk factors: Patient reports that seizures were due to birth control pills that contains the highest amount of estrogen   Previous ASMs: Phenobarb, barbiturate, Carbamazepine   Currenty ASMs: None   ASMs side effects: N/A   Brain Images: Normal head CT    Previous EEGs:    OTHER MEDICAL CONDITIONS: History of seizures, Hypertension, hyperlipidemia,   REVIEW OF SYSTEMS: Full 14 system review of systems performed and negative with exception of: As noted in the HPI   ALLERGIES: Allergies  Allergen Reactions   Barbiturates Other (See Comments)    A small amount "overdosed" the patient- "affected my stability and balance" (might have been given at the same as another med?)   Cefdinir Other (See Comments)    A small amount "overdosed" the patient- "affected my stability and balance" (might have been given at the same as another med?)   Phenobarbital Other (See Comments)    A small amount "overdosed" the patient- "affected my stability and balance" (might have been given at the same as another med?)   Tegretol [Carbamazepine] Other (See Comments)    A small amount "overdosed" the patient- "affected my stability and balance" (might have been given at the same as another med?)   Zonisamide Other (See Comments)    Negative reaction    HOME MEDICATIONS: Outpatient Medications Prior to Visit  Medication Sig Dispense Refill   Cholecalciferol (VITAMIN D-3) 25 MCG (1000 UT) CAPS TAKE 1 CAPSULE DAILY WITH BREAKFAST. 90 capsule 0   ketoconazole (NIZORAL) 2 % shampoo Apply 1 application topically once a week.      Multiple Vitamins-Minerals (ONE-A-DAY WOMENS 50 PLUS) TABS Take 1 tablet by mouth daily with breakfast.     Multiple Vitamins-Minerals (PRESERVISION/LUTEIN) CAPS Take 2 capsules by mouth daily with breakfast.  Polyethyl Glycol-Propyl Glycol (SYSTANE) 0.4-0.3 % SOLN Place 1-2 drops into both eyes 3 (three) times daily as needed (for dryness).      Sodium Fluoride (PREVIDENT 5000 BOOSTER PLUS) 1.1 % PSTE Place 1 application onto teeth daily as needed.  (Patient not taking: Reported on 05/19/2022)     No facility-administered medications prior to visit.    PAST MEDICAL HISTORY: Past Medical History:  Diagnosis Date   Arthritis     Cancer [facility])    skin   Cataract 2010   Dr. Alexander [place], Dr.. Jerlyn Ly @ [facility]   Epilepsy [facility]) 08/11/2015   Hyperlipidemia 12/08/2015   Hypertension    Polyp of colon 08/11/2015    PAST SURGICAL HISTORY: Past Surgical History:  Procedure Laterality Date   [place]   COLON SURGERY  09/12/2006   FEMUR SURGERY  2018   Broken   TONSILLECTOMY  09/12/1944    FAMILY HISTORY: Family History  Problem Relation Age of Onset   Osteoporosis Mother    Dementia Mother    Arthritis Mother    Stroke Father 68       mini   Dementia Father    CVA Father    Cancer Sister        breast   Dementia Sister        severe   Alzheimer's disease Sister    Diabetes Sister    Hypertension Sister    Stroke Paternal Grandfather        1970    SOCIAL HISTORY: Social History   Socioeconomic History   Marital status: Widowed    Spouse name: Not on file   Number of children: Not on file   Years of education: Not on file   Highest education level: Not on file  Occupational History   Occupation: Electrical engineer    Comment: retired  Tobacco Use   Smoking status: Never   Smokeless tobacco: Never  Vaping Use   Vaping Use: Never used  Substance and Sexual Activity   Alcohol use: Never   Drug use: Never   Sexual activity: Not Currently  Other Topics Concern   Not on file  Social History Narrative   Social History     Socioeconomic History       Marital status: Widowed       Number of children:2       Years of education:  AB in Art [phone])       Highest education level: Bachelors in      Occupational History : Engineer, 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       Food insecurity:         Worry: Not on file         Inability: Not on file       Transportation needs:         Medical: Not on file         Non-medical: Not on file     Tobacco Use       Smoking status: Not on file      Substance and Sexual Activity       Alcohol use: Not on file       Drug use: Not on file       Sexual activity: Not on file  Lifestyle       Physical activity:         Days per week: Not on file         Minutes per session: A little       Stress: Not on file     Relationships       Social connections:         Talks on phone: Not on file         Gets together: Not on file         Attends religious service: Not on file         Active member of club or organization: Not on file         Attends meetings of clubs or organizations: Not on file         Relationship status: Not on file       Intimate partner violence:         Fear of current or ex partner: Not on file         Emotionally abused: Not on file         Physically abused: Not on file         Forced sexual activity: Not on file     Other Topics       Concerns:         Not on file     Social History Narrative       Not on file      Social Determinants of Health   Financial Resource Strain: Not on file  Food Insecurity: Not on file  Transportation Needs: Not on file  Physical Activity: Not on file  Stress: Not on file  Social Connections: Not on file  Intimate Partner Violence: Not on file    PHYSICAL EXAM   GENERAL EXAM/CONSTITUTIONAL: Vitals:  Vitals:   05/19/22 1321  BP: (!) 167/79  Pulse: 93  Weight: 207 lb 8 oz (94.1 kg)  Height: '5\' 3"'$  (1.6 m)   Body mass index is 36.76 kg/m. Wt Readings from Last 3 Encounters:  05/19/22 207 lb 8 oz (94.1 kg)  05/12/22 204 lb (92.5 kg)  04/06/22 207 lb 3.2 oz (94 kg)   Patient is in no distress; well developed, nourished and groomed; neck is supple  EYES: Pupils round and reactive to light, Visual fields full to confrontation, Extraocular movements intacts,  No results found.  MUSCULOSKELETAL: Gait, strength, tone, movements noted in Neurologic exam below  NEUROLOGIC: MENTAL STATUS:      No data to display         awake, alert, oriented to person,  place and time recent and remote memory intact normal attention and concentration language fluent, comprehension intact, naming intact fund of knowledge appropriate  CRANIAL NERVE:  2nd, 3rd, 4th, 6th - pupils equal and reactive to light, visual fields full to confrontation, extraocular muscles intact, no nystagmus 5th - facial sensation symmetric 7th - facial strength symmetric 8th - hearing intact 9th - palate elevates symmetrically, uvula midline 11th - shoulder shrug symmetric 12th - tongue protrusion midline  MOTOR:  normal bulk and tone, full strength in the BUE, BLE  SENSORY:  normal and symmetric to light touch  COORDINATION:  finger-nose-finger, fine finger movements normal  REFLEXES:  deep tendon reflexes present and symmetric  GAIT/STATION:  Ambulates with a walker      DIAGNOSTIC DATA (LABS, IMAGING, TESTING) - I reviewed patient records, labs, notes, testing and imaging myself where available.  Lab Results  Component Value Date   WBC 9.3 04/12/2022   HGB 13.2 04/12/2022   HCT 40.7 04/12/2022   MCV 86.8 04/12/2022   PLT 300 04/12/2022      Component Value Date/Time   NA 139 04/12/2022 0745   NA 141 10/19/2018 0000   K 3.8 04/12/2022 0745   CL 102 04/12/2022 0745   CL 106 10/19/2018 0000   CO2 26 04/12/2022 0745   CO2 30 10/19/2018 0000   GLUCOSE 104 (H) 04/12/2022 0745   BUN 19 04/12/2022 0745   BUN 13 10/19/2018 0000   CREATININE 1.01 (H) 04/12/2022 0745   CALCIUM 9.4 04/12/2022 0745   CALCIUM 9.0 10/19/2018 0000   PROT 6.9 03/24/2022 1710   PROT 6.5 10/19/2018 0000   ALBUMIN 3.5 03/24/2022 1710   ALBUMIN 3.7 10/19/2018 0000   AST 33 03/24/2022 1710   ALT 21 03/24/2022 1710   ALKPHOS 82 03/24/2022 1710   BILITOT 0.4 03/24/2022 1710   GFRNONAA 56 (L) 03/24/2022 1710   GFRNONAA 64 10/19/2018 0000   GFRAA >60 11/05/2019 1434   Lab Results  Component Value Date   CHOL 172 12/24/2018   HDL 49 (L) 12/24/2018   LDLCALC 104 (H)  12/24/2018   TRIG 99 12/24/2018   Lab Results  Component Value Date   HGBA1C 5.9 (H) 03/19/2020   No results found for: "VITAMINB12" Lab Results  Component Value Date   TSH 1.601 03/26/2019    Head CT 03/24/22 No acute intracranial abnormality.  I personally reviewed brain Images.   ASSESSMENT AND PLAN  81 y.o. year old female  with history of seizures in her 103s, last seizure more than 30 years ago who is present after 1 episode of altered mental status reported as unresponsiveness.  Patient reported she was very hungry that day, aware of the questions but unable to answer at that time.  She denies any abnormal movement.  Because of her history of seizures, I will proceed by obtaining a routine EEG, if abnormal then I will bring the patient back to discuss starting a medication but if normal she can continue to follow-up with PCP and return as needed.  This was discussed with the patient and she is comfortable with plans.  Return sooner if worse.   1. Altered mental status, unspecified altered mental status type     Patient Instructions  Continue current medications We will obtain routine EEG, I will contact you to go over the results.  If abnormal I will contact you to discuss medications Continue follow-up with your PCP and return as needed.   Per [facility] statutes, patients with seizures are not allowed to drive until they have been seizure-free for six months.  Other recommendations include using caution when using heavy equipment or power tools. Avoid working on ladders or at heights. Take showers instead of baths.  Do not swim alone.  Ensure the water temperature is not too high on the home water heater. Do not go swimming alone. Do not lock yourself in a room alone (i.e. bathroom). When caring for infants or small children, sit down when holding, feeding, or changing them to minimize risk of injury to the child in the event you have a seizure. Maintain good sleep  hygiene. Avoid alcohol.  Also recommend adequate sleep, hydration, good diet and minimize stress.   During the Seizure  - First, ensure adequate ventilation and place patients on the floor on their left side  Loosen clothing around  the neck and ensure the airway is patent. If the patient is clenching the teeth, do not force the mouth open with any object as this can cause severe damage - Remove all items from the surrounding that can be hazardous. The patient may be oblivious to what's happening and may not even know what he or she is doing. If the patient is confused and wandering, either gently guide him/her away and block access to outside areas - Reassure the individual and be comforting - Call 911. In most cases, the seizure ends before EMS arrives. However, there are cases when seizures may last over 3 to 5 minutes. Or the individual may have developed breathing difficulties or severe injuries. If a pregnant patient or a person with diabetes develops a seizure, it is prudent to call an ambulance. - Finally, if the patient does not regain full consciousness, then call EMS. Most patients will remain confused for about 45 to 90 minutes after a seizure, so you must use judgment in calling for help. - Avoid restraints but make sure the patient is in a bed with padded side rails - Place the individual in a lateral position with the neck slightly flexed; this will help the saliva drain from the mouth and prevent the tongue from falling backward - Remove all nearby furniture and other hazards from the area - Provide verbal assurance as the individual is regaining consciousness - Provide the patient with privacy if possible - Call for help and start treatment as ordered by the caregiver   After the Seizure (Postictal Stage)  After a seizure, most patients experience confusion, fatigue, muscle pain and/or a headache. Thus, one should permit the individual to sleep. For the next few days, reassurance  is essential. Being calm and helping reorient the person is also of importance.  Most seizures are painless and end spontaneously. Seizures are not harmful to others but can lead to complications such as stress on the lungs, brain and the heart. Individuals with prior lung problems may develop labored breathing and respiratory distress.     Orders Placed This Encounter  Procedures   EEG adult    No orders of the defined types were placed in this encounter.   Return if symptoms worsen or fail to improve.    Alric Ran, MD 05/22/2022, 6:06 PM  [facility] [address], [place], [place] [address] [phone]

## 2022-05-22 NOTE — Patient Instructions (Signed)
Continue current medications We will obtain routine EEG, I will contact you to go over the results.  If abnormal I will contact you to discuss medications Continue follow-up with your PCP and return as needed.

## 2022-05-24 DIAGNOSIS — L218 Other seborrheic dermatitis: Secondary | ICD-10-CM | POA: Diagnosis not present

## 2022-05-24 DIAGNOSIS — L570 Actinic keratosis: Secondary | ICD-10-CM | POA: Diagnosis not present

## 2022-05-24 DIAGNOSIS — Z85828 Personal history of other malignant neoplasm of skin: Secondary | ICD-10-CM | POA: Diagnosis not present

## 2022-05-24 DIAGNOSIS — D229 Melanocytic nevi, unspecified: Secondary | ICD-10-CM | POA: Diagnosis not present

## 2022-06-21 DIAGNOSIS — L219 Seborrheic dermatitis, unspecified: Secondary | ICD-10-CM | POA: Diagnosis not present

## 2022-06-21 DIAGNOSIS — D229 Melanocytic nevi, unspecified: Secondary | ICD-10-CM | POA: Diagnosis not present

## 2022-06-21 DIAGNOSIS — Z85828 Personal history of other malignant neoplasm of skin: Secondary | ICD-10-CM | POA: Diagnosis not present

## 2022-06-21 DIAGNOSIS — L570 Actinic keratosis: Secondary | ICD-10-CM | POA: Diagnosis not present

## 2022-06-28 DIAGNOSIS — Z23 Encounter for immunization: Secondary | ICD-10-CM | POA: Diagnosis not present

## 2022-07-06 DIAGNOSIS — Z961 Presence of intraocular lens: Secondary | ICD-10-CM | POA: Diagnosis not present

## 2022-07-06 DIAGNOSIS — H43811 Vitreous degeneration, right eye: Secondary | ICD-10-CM | POA: Diagnosis not present

## 2022-07-06 DIAGNOSIS — H353132 Nonexudative age-related macular degeneration, bilateral, intermediate dry stage: Secondary | ICD-10-CM | POA: Diagnosis not present

## 2022-07-06 DIAGNOSIS — H04123 Dry eye syndrome of bilateral lacrimal glands: Secondary | ICD-10-CM | POA: Diagnosis not present

## 2022-07-06 DIAGNOSIS — H40013 Open angle with borderline findings, low risk, bilateral: Secondary | ICD-10-CM | POA: Diagnosis not present

## 2022-07-14 ENCOUNTER — Encounter: Payer: Medicare Other | Admitting: Nurse Practitioner

## 2022-07-14 DIAGNOSIS — Z23 Encounter for immunization: Secondary | ICD-10-CM | POA: Diagnosis not present

## 2022-08-03 ENCOUNTER — Encounter: Payer: Medicare Other | Admitting: Family Medicine

## 2022-08-19 ENCOUNTER — Telehealth: Payer: Medicare Other

## 2022-08-19 NOTE — Telephone Encounter (Signed)
Mast, Man X, NP  You15 minutes ago (11:56 AM)    I recommend it for her.  Thanks.    Patient is aware of response and verbalized understanding

## 2022-08-19 NOTE — Telephone Encounter (Signed)
Patient called to question if she is a candidate for the RSV vaccine   Please advise, patient states she would like a prompt response

## 2022-08-22 ENCOUNTER — Encounter (INDEPENDENT_AMBULATORY_CARE_PROVIDER_SITE_OTHER): Payer: Medicare Other | Admitting: Ophthalmology

## 2022-08-22 DIAGNOSIS — H353124 Nonexudative age-related macular degeneration, left eye, advanced atrophic with subfoveal involvement: Secondary | ICD-10-CM | POA: Diagnosis not present

## 2022-08-22 DIAGNOSIS — H35723 Serous detachment of retinal pigment epithelium, bilateral: Secondary | ICD-10-CM | POA: Diagnosis not present

## 2022-08-22 DIAGNOSIS — H353113 Nonexudative age-related macular degeneration, right eye, advanced atrophic without subfoveal involvement: Secondary | ICD-10-CM | POA: Diagnosis not present

## 2022-08-23 DIAGNOSIS — D2239 Melanocytic nevi of other parts of face: Secondary | ICD-10-CM | POA: Diagnosis not present

## 2022-08-23 DIAGNOSIS — L570 Actinic keratosis: Secondary | ICD-10-CM | POA: Diagnosis not present

## 2022-08-23 DIAGNOSIS — L853 Xerosis cutis: Secondary | ICD-10-CM | POA: Diagnosis not present

## 2022-08-23 DIAGNOSIS — Z85828 Personal history of other malignant neoplasm of skin: Secondary | ICD-10-CM | POA: Diagnosis not present

## 2022-08-23 DIAGNOSIS — S70362A Insect bite (nonvenomous), left thigh, initial encounter: Secondary | ICD-10-CM | POA: Diagnosis not present

## 2022-09-10 ENCOUNTER — Encounter (HOSPITAL_COMMUNITY): Payer: Self-pay

## 2022-09-10 ENCOUNTER — Emergency Department (HOSPITAL_COMMUNITY)
Admission: EM | Admit: 2022-09-10 | Discharge: 2022-09-10 | Disposition: A | Discharge status: Home / Self Care | Payer: Medicare Other | Attending: Emergency Medicine | Admitting: Emergency Medicine

## 2022-09-10 ENCOUNTER — Other Ambulatory Visit: Payer: Self-pay

## 2022-09-10 DIAGNOSIS — R7309 Other abnormal glucose: Secondary | ICD-10-CM | POA: Diagnosis not present

## 2022-09-10 DIAGNOSIS — R55 Syncope and collapse: Secondary | ICD-10-CM | POA: Diagnosis not present

## 2022-09-10 DIAGNOSIS — R410 Disorientation, unspecified: Secondary | ICD-10-CM | POA: Diagnosis not present

## 2022-09-10 DIAGNOSIS — G40909 Epilepsy, unspecified, not intractable, without status epilepticus: Secondary | ICD-10-CM | POA: Insufficient documentation

## 2022-09-10 DIAGNOSIS — R569 Unspecified convulsions: Secondary | ICD-10-CM

## 2022-09-10 DIAGNOSIS — I10 Essential (primary) hypertension: Secondary | ICD-10-CM | POA: Diagnosis not present

## 2022-09-10 LAB — CBG MONITORING, ED: Glucose-Capillary: 132 mg/dL — ABNORMAL HIGH (ref 70–99)

## 2022-09-10 NOTE — ED Notes (Signed)
Pt ambulated with walker without any issues.

## 2022-09-10 NOTE — Discharge Instructions (Addendum)
Department of Motor Vehicle [facility]) of [place] regulations for seizures - It is the patient's responsibility to report the incidence of the seizure in the state of [place]. [place] has no statutory provision requiring physicians to report patients diagnosed with epilepsy or seizures to a central state agency.  The recommended DMV regulation requirement for a driver in [place] for an individual with a seizure is that they be seizure-free for 6-12 months. However, the DMV may consider the following exceptions to this general rule where: (1) a physician-directed change in medication causes a seizure and the individual immediately resumes the previous therapy which controlled seizures; (2) there is a history of nocturnal seizures or seizures which do not involve loss of consciousness, loss of control of motor function, or loss of appropriate sensation and information process; and (3) an individual has a seizure disorder preceded by an aura (warning) lasting 2-3 minutes. While the [facility] may also give consideration to other unusual circumstances which may affect the general requirement that drivers be seizure-free for 6-12 months, interpretation of these circumstances and assignment of restrictions is at the discretion of the Medical Advisor. The DMV also considers compliance with medical therapy essential for safe driving. [facility] Physician's Guide to [employer] (June, 1995 ed.)] The Department learns of an individual's condition by inquiring on the application form or renewal form, a physician's report to the [facility], an accident report or from correspondence from the individual. The person may be required to submit a Medical Report Form either annually or semi-annually.

## 2022-09-10 NOTE — ED Provider Notes (Signed)
[facility] DEPT Provider Note   CSN: [id number] Arrival date & time: 09/10/22  1238     History  Chief Complaint  Patient presents with   Seizures    Katherine Obrien is a 81 y.o. female.   Seizures    81 year old female with medical history significant for HTN, HLD, epilepsy not on AEDs, follows outpatient with neurology who presents to the emergency department after a seizure.  She presents from her independent living facility where staff found the patient unresponsive for about 10 to 15 minutes in a chair.  The patient was postictal upon EMS arrival with a CBG of 188.  She has since come back to her baseline mental status.  She last saw a neurologist on 05/19/2022 and is not currently on AEDs. She did bite her tongue today.   Home Medications Prior to Admission medications   Medication Sig Start Date End Date Taking? Authorizing Provider  Cholecalciferol (VITAMIN D-3) 25 MCG (1000 UT) CAPS TAKE 1 CAPSULE DAILY WITH BREAKFAST. 09/24/19   Mast, Man X, NP  ketoconazole (NIZORAL) 2 % shampoo Apply 1 application topically once a week.  04/23/19   [provider]  Multiple Vitamins-Minerals (ONE-A-DAY WOMENS 50 PLUS) TABS Take 1 tablet by mouth daily with breakfast.    [provider]  Multiple Vitamins-Minerals (PRESERVISION/LUTEIN) CAPS Take 2 capsules by mouth daily with breakfast.    [provider]  Polyethyl Glycol-Propyl Glycol (SYSTANE) 0.4-0.3 % SOLN Place 1-2 drops into both eyes 3 (three) times daily as needed (for dryness).     [provider]  Sodium Fluoride (PREVIDENT 5000 BOOSTER PLUS) 1.1 % PSTE Place 1 application onto teeth daily as needed.  Patient not taking: Reported on 05/19/2022    [provider]      Allergies    Barbiturates, Cefdinir, Phenobarbital, Tegretol [carbamazepine], and Zonisamide    Review of Systems   Review of Systems  Neurological:  Positive for seizures.  All other  systems reviewed and are negative.   Physical Exam Updated Vital Signs BP (!) 160/79   Pulse 95   Temp 97.8 F (36.6 C) (Oral)   Resp 19   SpO2 96%  Physical Exam Vitals and nursing note reviewed.  Constitutional:      General: She is not in acute distress. HENT:     Head: Normocephalic and atraumatic.     Mouth/Throat:     Comments: Lesion along left lateral tongue, hemostatic Eyes:     Conjunctiva/sclera: Conjunctivae normal.     Pupils: Pupils are equal, round, and reactive to light.  Cardiovascular:     Rate and Rhythm: Normal rate and regular rhythm.  Pulmonary:     Effort: Pulmonary effort is normal. No respiratory distress.  Abdominal:     General: There is no distension.     Tenderness: There is no guarding.  Musculoskeletal:        General: No deformity or signs of injury.     Cervical back: Neck supple.  Skin:    Findings: No lesion or rash.  Neurological:     General: No focal deficit present.     Mental Status: She is alert. Mental status is at baseline.     Comments: MENTAL STATUS EXAM:    Orientation: Alert and oriented to person, place and time.  Memory: Cooperative, follows commands well.  Language: Speech is clear and language is normal.   CRANIAL NERVES:    CN 2 (Optic): Visual fields intact  to confrontation.  CN 3,4,6 (EOM): Pupils equal and reactive to light. Full extraocular eye movement without nystagmus.  CN 5 (Trigeminal): Facial sensation is normal, no weakness of masticatory muscles.  CN 7 (Facial): No facial weakness or asymmetry.  CN 8 (Auditory): Auditory acuity grossly normal.  CN 9,10 (Glossophar): The uvula is midline, the palate elevates symmetrically.  CN 11 (spinal access): Normal sternocleidomastoid and trapezius strength.  CN 12 (Hypoglossal): The tongue is midline. No atrophy or fasciculations.Marland Kitchen   MOTOR:  Muscle Strength: 5/5RUE, 5/5LUE, 5/5RLE, 5/5LLE.   COORDINATION:   Intact finger-to-nose, no tremor.   SENSATION:    Intact to light touch all four extremities.        ED Results / Procedures / Treatments   Labs (all labs ordered are listed, but only abnormal results are displayed) Labs Reviewed  CBG MONITORING, ED - Abnormal; Notable for the following components:      Result Value   Glucose-Capillary 132 (*)    All other components within normal limits    EKG None  Radiology No results found.  Procedures Procedures    Medications Ordered in ED Medications - No data to display  ED Course/ Medical Decision Making/ A&P                           Medical Decision Making   81 year old female with medical history significant for HTN, HLD, epilepsy not on AEDs, follows outpatient with neurology who presents to the emergency department after a seizure.  She presents from her independent living facility where staff found the patient unresponsive for about 10 to 15 minutes in a chair.  The patient was postictal upon EMS arrival with a CBG of 188.  She has since come back to her baseline mental status. She denies any falls or head trauma.  She last saw a neurologist on 05/19/2022 and is not currently on AEDs. She did bite her tongue today.   On arrival, the patient was vitally stable. Sinus rhythm noted on cardiac telemetry.  Normal neurologic exam.  CBG normal.  Presenting with likely breakthrough seizure with a postictal period, now back to neurologic baseline. No infectious symptoms or signs of trauma.  Patient does not want to go back on AEDs as she has previously stated in the past to make her tired.  Has followed outpatient with neurology.  She does not drive.  Currently at her neurologic baseline without complaint.  Ambulatory in the ED.  Stable for discharge with neurology and PCP follow-up.   Final Clinical Impression(s) / ED Diagnoses Final diagnoses:  Seizure [facility])    Rx / [place] Orders ED Discharge Orders     None         Regan Lemming, MD 09/10/22 1357

## 2022-09-10 NOTE — ED Triage Notes (Signed)
[facility] reports pt coming from [facility]. Staff states they heard pt fall but did not witness seizure, states she was unresponsive for about 10-15 minutes. Postical upon EMS arrival. CBG 188

## 2022-09-29 ENCOUNTER — Non-Acute Institutional Stay: Payer: Medicare Other | Admitting: Nurse Practitioner

## 2022-09-29 ENCOUNTER — Encounter: Payer: Self-pay | Admitting: Nurse Practitioner

## 2022-09-29 VITALS — BP 160/84 | HR 96 | Temp 97.7°F | Resp 18 | Ht 63.0 in | Wt 206.0 lb

## 2022-09-29 DIAGNOSIS — E669 Obesity, unspecified: Secondary | ICD-10-CM | POA: Diagnosis not present

## 2022-09-29 DIAGNOSIS — R7303 Prediabetes: Secondary | ICD-10-CM | POA: Diagnosis not present

## 2022-09-29 DIAGNOSIS — N1831 Chronic kidney disease, stage 3a: Secondary | ICD-10-CM

## 2022-09-29 DIAGNOSIS — I10 Essential (primary) hypertension: Secondary | ICD-10-CM

## 2022-09-29 DIAGNOSIS — R4020 Unspecified coma: Secondary | ICD-10-CM

## 2022-09-29 DIAGNOSIS — G40909 Epilepsy, unspecified, not intractable, without status epilepticus: Secondary | ICD-10-CM

## 2022-09-29 DIAGNOSIS — Z6836 Body mass index (BMI) 36.0-36.9, adult: Secondary | ICD-10-CM | POA: Diagnosis not present

## 2022-09-29 DIAGNOSIS — E785 Hyperlipidemia, unspecified: Secondary | ICD-10-CM

## 2022-09-29 NOTE — Assessment & Plan Note (Signed)
Bun/creat [birth year] 8/23

## 2022-09-29 NOTE — Assessment & Plan Note (Signed)
Hx of hyperlipidemia, LDL 135 02/25/20, off Atorvastatin

## 2022-09-29 NOTE — Assessment & Plan Note (Addendum)
ED eval 09/11/23 for unresponsive 10-15 minutes, bit her tongue, off Depakote, saw neurology 05/19/22. The patient stated she passes out when she is hungry, will obtain Hgb A1c

## 2022-09-29 NOTE — Assessment & Plan Note (Signed)
Sometime passing out when she is hungry, update Hgb a1c

## 2022-09-29 NOTE — Assessment & Plan Note (Addendum)
Elevated Sbp, asymptomatic, the patient denied antihypertensive meds, desires PT, will monitor.

## 2022-09-29 NOTE — Assessment & Plan Note (Signed)
No further occurrence, obtain Hgb A1c.

## 2022-09-29 NOTE — Assessment & Plan Note (Signed)
03/19/20 Hgb a1c 5.9, update Hgb a1c

## 2022-09-29 NOTE — Progress Notes (Signed)
Location:   [facility] Room Number: [place] 3762-G Place of Service:  Clinic (12) Provider: Marlana Latus NP  Code Status: DNR Goals of Care:     09/29/2022    1:27 PM  Advanced Directives  Does Patient Have a Medical Advance Directive? No  Would patient like information on creating a medical advance directive? No - Patient declined     Chief Complaint  Patient presents with   Acute Visit    Patient is here for follow up after hospital stay     HPI: Patient is a 82 y.o. female seen today for medical management of chronic diseases.     Hx of hyperlipidemia, LDL 135 02/25/20, off Atorvastatin Seizure, ED eval 09/11/23 for unresponsive 10-15 minutes, bit her tongue, off Depakote, saw neurology 05/19/22 The patient stated she sleeps better, bathroom trip only 1x/night.  CKD Bun/creat 19/1.01 8/23  Past Medical History:  Diagnosis Date   Arthritis    Cancer [facility])    skin   Cataract 2010   Dr. Alexander Mt, Dr.. Jerlyn Ly @ [place]   Epilepsy [facility]) 08/11/2015   Hyperlipidemia 12/08/2015   Hypertension    Polyp of colon 08/11/2015    Past Surgical History:  Procedure Laterality Date   [place]   COLON SURGERY  09/12/2006   FEMUR SURGERY  2018   Broken   TONSILLECTOMY  09/12/1944    Allergies  Allergen Reactions   Barbiturates Other (See Comments)    A small amount "overdosed" the patient- "affected my stability and balance" (might have been given at the same as another med?)   Cefdinir Other (See Comments)    A small amount "overdosed" the patient- "affected my stability and balance" (might have been given at the same as another med?)   Phenobarbital Other (See Comments)    A small amount "overdosed" the patient- "affected my stability and balance" (might have been given at the same as another med?)   Tegretol [Carbamazepine] Other (See Comments)    A small amount "overdosed" the patient- "affected my  stability and balance" (might have been given at the same as another med?)   Zonisamide Other (See Comments)    Negative reaction    Allergies as of 09/29/2022       Reactions   Barbiturates Other (See Comments)   A small amount "overdosed" the patient- "affected my stability and balance" (might have been given at the same as another med?)   Cefdinir Other (See Comments)   A small amount "overdosed" the patient- "affected my stability and balance" (might have been given at the same as another med?)   Phenobarbital Other (See Comments)   A small amount "overdosed" the patient- "affected my stability and balance" (might have been given at the same as another med?)   Tegretol [carbamazepine] Other (See Comments)   A small amount "overdosed" the patient- "affected my stability and balance" (might have been given at the same as another med?)   Zonisamide Other (See Comments)   Negative reaction        Medication List        Accurate as of September 29, 2022  4:07 PM. If you have any questions, ask your nurse or doctor.          STOP taking these medications    PreviDent 5000 Booster Plus 1.1 % Pste Generic drug: Sodium Fluoride Stopped by: Lakeith Careaga X Fritz Cauthon, NP  TAKE these medications    ketoconazole 2 % shampoo Commonly known as: NIZORAL Apply 1 application topically once a week.   One-A-Day Womens 50 Plus Tabs Take 1 tablet by mouth daily with breakfast.   PreserVision/Lutein Caps Take 2 capsules by mouth daily with breakfast.   Systane 0.4-0.3 % Soln Generic drug: Polyethyl Glycol-Propyl Glycol Place 1-2 drops into both eyes 3 (three) times daily as needed (for dryness).   Vitamin D-3 25 MCG (1000 UT) Caps TAKE 1 CAPSULE DAILY WITH BREAKFAST.        Review of Systems:  Review of Systems  Constitutional:  Negative for fatigue, fever and unexpected weight change.  HENT:  Positive for hearing loss. Negative for congestion and voice change.   Respiratory:   Negative for cough, chest tightness, shortness of breath and wheezing.   Cardiovascular:  Positive for leg swelling. Negative for chest pain and palpitations.  Gastrointestinal:  Negative for abdominal distention, abdominal pain, constipation and diarrhea.  Genitourinary:  Negative for difficulty urinating, dysuria and urgency.  Musculoskeletal:  Positive for arthralgias and gait problem.  Skin:  Negative for color change.  Neurological:  Negative for dizziness, seizures, speech difficulty, weakness and headaches.  Psychiatric/Behavioral:  Negative for behavioral problems and sleep disturbance. The patient is not nervous/anxious.     Health Maintenance  Topic Date Due   Medicare Annual Wellness (AWV)  Never done   COVID-19 Vaccine (4 - 2023-24 season) 09/08/2022   DTaP/Tdap/Td (3 - Td or Tdap) 12/21/2025   Pneumonia Vaccine 61+ Years old  Completed   INFLUENZA VACCINE  Completed   DEXA SCAN  Completed   Zoster Vaccines- Shingrix  Completed   HPV VACCINES  Aged Out    Physical Exam: Vitals:   09/29/22 1326 09/29/22 1345  BP: (!) 160/72 (!) 160/84  Pulse: 96   Resp: 18   Temp: 97.7 F (36.5 C)   SpO2: 98%   Weight: 206 lb (93.4 kg)   Height: '5\' 3"'$  (1.6 m)    Body mass index is 36.49 kg/m. Physical Exam Vitals and nursing note reviewed.  Constitutional:      Appearance: Normal appearance. She is obese.  HENT:     Head: Normocephalic and atraumatic.     Nose: Nose normal.     Mouth/Throat:     Mouth: Mucous membranes are moist.  Eyes:     Extraocular Movements: Extraocular movements intact.     Conjunctiva/sclera: Conjunctivae normal.     Pupils: Pupils are equal, round, and reactive to light.  Cardiovascular:     Rate and Rhythm: Normal rate and regular rhythm.     Heart sounds: No murmur heard. Pulmonary:     Effort: Pulmonary effort is normal.     Breath sounds: No wheezing, rhonchi or rales.  Abdominal:     General: Bowel sounds are normal.     Palpations:  Abdomen is soft.     Tenderness: There is no abdominal tenderness.  Musculoskeletal:     Cervical back: Normal range of motion and neck supple.     Right lower leg: Edema present.     Left lower leg: Edema present.     Comments: Trace edema BLE  Skin:    General: Skin is warm and dry.  Neurological:     General: No focal deficit present.     Mental Status: She is alert and oriented to person, place, and time. Mental status is at baseline.     Motor: No weakness.  Coordination: Coordination normal.     Gait: Gait abnormal.  Psychiatric:        Mood and Affect: Mood normal.        Behavior: Behavior normal.        Thought Content: Thought content normal.        Judgment: Judgment normal.     Labs reviewed: Basic Metabolic Panel: Recent Labs    03/24/22 1710 04/12/22 0745  NA 141 139  K 3.7 3.8  CL 102 102  CO2 24 26  GLUCOSE 134* 104*  BUN 20 19  CREATININE 1.00 1.01*  CALCIUM 9.5 9.4   Liver Function Tests: Recent Labs    03/24/22 1710  AST 33  ALT 21  ALKPHOS 82  BILITOT 0.4  PROT 6.9  ALBUMIN 3.5   No results for input(s): "LIPASE", "AMYLASE" in the last 8760 hours. No results for input(s): "AMMONIA" in the last 8760 hours. CBC: Recent Labs    03/24/22 1710 04/12/22 0745  WBC 14.8* 9.3  NEUTROABS 12.8* 5,413  HGB 13.3 13.2  HCT 41.9 40.7  MCV 89.1 86.8  PLT 241 300   Lipid Panel: No results for input(s): "CHOL", "HDL", "LDLCALC", "TRIG", "CHOLHDL", "LDLDIRECT" in the last 8760 hours. Lab Results  Component Value Date   HGBA1C 5.9 (H) 03/19/2020    Procedures since last visit: No results found.  Assessment/Plan  Essential hypertension Elevated Sbp, asymptomatic, the patient denied antihypertensive meds, desires PT, will monitor.   Hyperlipidemia Hx of hyperlipidemia, LDL 135 02/25/20, off Atorvastatin  Seizure disorder ([place])  ED eval 09/11/23 for unresponsive 10-15 minutes, bit her tongue, off Depakote, saw neurology 05/19/22. The  patient stated she passes out when she is hungry, will obtain Hgb A1c  CKD (chronic kidney disease) stage 3, GFR 30-59 ml/min (HCC) Bun/creat 19/1.01 8/23  Obese Sometime passing out when she is hungry, update Hgb a1c  Loss of consciousness (HCC) No further occurrence, obtain Hgb A1c.   Prediabetes 03/19/20 Hgb a1c 5.9, update Hgb a1c   Labs/tests ordered:  CBC/diff, CMP/eGFR, lipid panel, Hgb A1c, Vit B12  Next appt:  10/27/2022

## 2022-10-04 DIAGNOSIS — M6281 Muscle weakness (generalized): Secondary | ICD-10-CM | POA: Diagnosis not present

## 2022-10-04 DIAGNOSIS — N1831 Chronic kidney disease, stage 3a: Secondary | ICD-10-CM | POA: Diagnosis not present

## 2022-10-04 DIAGNOSIS — M2042 Other hammer toe(s) (acquired), left foot: Secondary | ICD-10-CM | POA: Diagnosis not present

## 2022-10-04 DIAGNOSIS — G40909 Epilepsy, unspecified, not intractable, without status epilepticus: Secondary | ICD-10-CM

## 2022-10-04 DIAGNOSIS — M2041 Other hammer toe(s) (acquired), right foot: Secondary | ICD-10-CM | POA: Diagnosis not present

## 2022-10-04 DIAGNOSIS — E785 Hyperlipidemia, unspecified: Secondary | ICD-10-CM

## 2022-10-04 DIAGNOSIS — R7303 Prediabetes: Secondary | ICD-10-CM

## 2022-10-04 DIAGNOSIS — R262 Difficulty in walking, not elsewhere classified: Secondary | ICD-10-CM | POA: Diagnosis not present

## 2022-10-04 DIAGNOSIS — L602 Onychogryphosis: Secondary | ICD-10-CM | POA: Diagnosis not present

## 2022-10-05 LAB — COMPLETE METABOLIC PANEL WITH GFR
AG Ratio: 1.2 (calc) (ref 1.0–2.5)
ALT: 20 U/L (ref 6–29)
AST: 17 U/L (ref 10–35)
Albumin: 3.9 g/dL (ref 3.6–5.1)
Alkaline phosphatase (APISO): 88 U/L (ref 37–153)
BUN: 20 mg/dL (ref 7–25)
CO2: 28 mmol/L (ref 20–32)
Calcium: 9.4 mg/dL (ref 8.6–10.4)
Chloride: 100 mmol/L (ref 98–110)
Creat: 0.93 mg/dL (ref 0.60–0.95)
Globulin: 3.3 g/dL (calc) (ref 1.9–3.7)
Glucose, Bld: 101 mg/dL — ABNORMAL HIGH (ref 65–99)
Potassium: 4.3 mmol/L (ref 3.5–5.3)
Sodium: 138 mmol/L (ref 135–146)
Total Bilirubin: 0.5 mg/dL (ref 0.2–1.2)
Total Protein: 7.2 g/dL (ref 6.1–8.1)
eGFR: 61 mL/min/{1.73_m2} (ref >=60)

## 2022-10-05 LAB — LIPID PANEL
Cholesterol: 202 mg/dL — ABNORMAL HIGH (ref <200)
HDL: 52 mg/dL (ref >=50)
LDL Cholesterol (Calc): 125 mg/dL (calc) — ABNORMAL HIGH
Non-HDL Cholesterol (Calc): 150 mg/dL (calc) — ABNORMAL HIGH (ref <130)
Total CHOL/HDL Ratio: 3.9 (calc) (ref <5.0)
Triglycerides: 136 mg/dL (ref <150)

## 2022-10-05 LAB — HEMOGLOBIN A1C
Hgb A1c MFr Bld: 6.4 % of total Hgb — ABNORMAL HIGH (ref <5.7)
Mean Plasma Glucose: 137 mg/dL
eAG (mmol/L): 7.6 mmol/L

## 2022-10-05 LAB — VITAMIN B12: Vitamin B-12: 769 pg/mL (ref 200–1100)

## 2022-10-06 DIAGNOSIS — R262 Difficulty in walking, not elsewhere classified: Secondary | ICD-10-CM | POA: Diagnosis not present

## 2022-10-06 DIAGNOSIS — M6281 Muscle weakness (generalized): Secondary | ICD-10-CM | POA: Diagnosis not present

## 2022-10-10 DIAGNOSIS — R262 Difficulty in walking, not elsewhere classified: Secondary | ICD-10-CM | POA: Diagnosis not present

## 2022-10-10 DIAGNOSIS — M6281 Muscle weakness (generalized): Secondary | ICD-10-CM | POA: Diagnosis not present

## 2022-10-11 DIAGNOSIS — R262 Difficulty in walking, not elsewhere classified: Secondary | ICD-10-CM | POA: Diagnosis not present

## 2022-10-11 DIAGNOSIS — M6281 Muscle weakness (generalized): Secondary | ICD-10-CM | POA: Diagnosis not present

## 2022-10-12 ENCOUNTER — Ambulatory Visit: Payer: Medicare Other | Admitting: Neurology

## 2022-10-13 DIAGNOSIS — R262 Difficulty in walking, not elsewhere classified: Secondary | ICD-10-CM | POA: Diagnosis not present

## 2022-10-13 DIAGNOSIS — M6281 Muscle weakness (generalized): Secondary | ICD-10-CM | POA: Diagnosis not present

## 2022-10-17 DIAGNOSIS — M6281 Muscle weakness (generalized): Secondary | ICD-10-CM | POA: Diagnosis not present

## 2022-10-17 DIAGNOSIS — R262 Difficulty in walking, not elsewhere classified: Secondary | ICD-10-CM | POA: Diagnosis not present

## 2022-10-20 DIAGNOSIS — M6281 Muscle weakness (generalized): Secondary | ICD-10-CM | POA: Diagnosis not present

## 2022-10-20 DIAGNOSIS — R262 Difficulty in walking, not elsewhere classified: Secondary | ICD-10-CM | POA: Diagnosis not present

## 2022-10-21 DIAGNOSIS — R262 Difficulty in walking, not elsewhere classified: Secondary | ICD-10-CM | POA: Diagnosis not present

## 2022-10-21 DIAGNOSIS — M6281 Muscle weakness (generalized): Secondary | ICD-10-CM | POA: Diagnosis not present

## 2022-10-24 DIAGNOSIS — R262 Difficulty in walking, not elsewhere classified: Secondary | ICD-10-CM | POA: Diagnosis not present

## 2022-10-24 DIAGNOSIS — M6281 Muscle weakness (generalized): Secondary | ICD-10-CM | POA: Diagnosis not present

## 2022-10-26 DIAGNOSIS — R262 Difficulty in walking, not elsewhere classified: Secondary | ICD-10-CM | POA: Diagnosis not present

## 2022-10-26 DIAGNOSIS — M6281 Muscle weakness (generalized): Secondary | ICD-10-CM | POA: Diagnosis not present

## 2022-10-27 ENCOUNTER — Encounter: Payer: Self-pay | Admitting: Nurse Practitioner

## 2022-10-27 ENCOUNTER — Encounter: Payer: Medicare Other | Admitting: Nurse Practitioner

## 2022-10-27 ENCOUNTER — Non-Acute Institutional Stay: Payer: Medicare Other | Admitting: Nurse Practitioner

## 2022-10-27 VITALS — BP 150/90 | HR 84 | Temp 97.7°F | Resp 18 | Ht 63.0 in | Wt 209.0 lb

## 2022-10-27 DIAGNOSIS — N1831 Chronic kidney disease, stage 3a: Secondary | ICD-10-CM

## 2022-10-27 DIAGNOSIS — E785 Hyperlipidemia, unspecified: Secondary | ICD-10-CM | POA: Diagnosis not present

## 2022-10-27 DIAGNOSIS — G40909 Epilepsy, unspecified, not intractable, without status epilepticus: Secondary | ICD-10-CM | POA: Diagnosis not present

## 2022-10-27 DIAGNOSIS — R7303 Prediabetes: Secondary | ICD-10-CM

## 2022-10-27 DIAGNOSIS — R262 Difficulty in walking, not elsewhere classified: Secondary | ICD-10-CM | POA: Diagnosis not present

## 2022-10-27 DIAGNOSIS — I10 Essential (primary) hypertension: Secondary | ICD-10-CM

## 2022-10-27 DIAGNOSIS — M6281 Muscle weakness (generalized): Secondary | ICD-10-CM | POA: Diagnosis not present

## 2022-10-27 MED ORDER — LISINOPRIL 20 MG PO TABS: 10.0000 mg | ORAL_TABLET | Freq: Every day | ORAL | 0 refills | Status: DC

## 2022-10-27 NOTE — Progress Notes (Signed)
Location:   [facility] Room Number: [id number] Place of Service:  Clinic (12) Provider: Marlana Latus NP  Code Status: DNR Goals of Care:     10/27/2022    9:34 AM  Advanced Directives  Does Patient Have a Medical Advance Directive? Yes  Type of Paramedic of [place];Living will;Out of facility DNR (pink MOST or yellow form)  Does patient want to make changes to medical advance directive? No - Patient declined  Copy of [place] in Chart? Yes - validated most recent copy scanned in chart (See row information)     Chief Complaint  Patient presents with   Medical Management of Chronic Issues    Patient is here for a follow up for chronic conditions, and lab review     Quality Metric Gaps    Patient is due for AWV scheduled for 11/17/2022 at 3pm    HPI: Patient is a 82 y.o. female seen today for medical management of chronic diseases.    HTN, elevated Bp, asymptomatic  Prediabetes Hgb A1c 6.4 10/04/22 Hx of hyperlipidemia, LDL 125 10/04/22,  off Atorvastatin Seizure, ED eval 09/11/23 for unresponsive 10-15 minutes, bit her tongue, off Depakote, saw neurology 05/19/22 The patient stated she sleeps better, bathroom trip only 1x/night.  CKD Bun/creat 20/0.93 10/04/22 Past Medical History:  Diagnosis Date   Arthritis    Cancer [facility])    skin   Cataract 2010   Dr. Alexander Mt, Dr.. Jerlyn Ly @ [place]   Epilepsy [facility]) 08/11/2015   Hyperlipidemia 12/08/2015   Hypertension    Polyp of colon 08/11/2015    Past Surgical History:  Procedure Laterality Date   [place]   COLON SURGERY  09/12/2006   FEMUR SURGERY  2018   Broken   TONSILLECTOMY  09/12/1944    Allergies  Allergen Reactions   Barbiturates Other (See Comments)    A small amount "overdosed" the patient- "affected my stability and balance" (might have been given at the same as another med?)   Cefdinir Other (See  Comments)    A small amount "overdosed" the patient- "affected my stability and balance" (might have been given at the same as another med?)   Phenobarbital Other (See Comments)    A small amount "overdosed" the patient- "affected my stability and balance" (might have been given at the same as another med?)   Tegretol [Carbamazepine] Other (See Comments)    A small amount "overdosed" the patient- "affected my stability and balance" (might have been given at the same as another med?)   Zonisamide Other (See Comments)    Negative reaction    Allergies as of 10/27/2022       Reactions   Barbiturates Other (See Comments)   A small amount "overdosed" the patient- "affected my stability and balance" (might have been given at the same as another med?)   Cefdinir Other (See Comments)   A small amount "overdosed" the patient- "affected my stability and balance" (might have been given at the same as another med?)   Phenobarbital Other (See Comments)   A small amount "overdosed" the patient- "affected my stability and balance" (might have been given at the same as another med?)   Tegretol [carbamazepine] Other (See Comments)   A small amount "overdosed" the patient- "affected my stability and balance" (might have been given at the same as another med?)   Zonisamide Other (See  Comments)   Negative reaction        Medication List        Accurate as of October 27, 2022 11:59 PM. If you have any questions, ask your nurse or doctor.          ketoconazole 2 % shampoo Commonly known as: NIZORAL Apply 1 application topically once a week.   lisinopril 20 MG tablet Commonly known as: ZESTRIL Take 0.5 tablets (10 mg total) by mouth daily. Started by: Kees Idrovo X Lyndel Dancel, NP   One-A-Day Womens 50 Plus Tabs Take 1 tablet by mouth daily with breakfast.   PreserVision/Lutein Caps Take 2 capsules by mouth daily with breakfast.   Systane 0.4-0.3 % Soln Generic drug: Polyethyl Glycol-Propyl  Glycol Place 1-2 drops into both eyes 3 (three) times daily as needed (for dryness).   Vitamin D-3 25 MCG (1000 UT) Caps TAKE 1 CAPSULE DAILY WITH BREAKFAST.        Review of Systems:  Review of Systems  Constitutional:  Negative for fatigue, fever and unexpected weight change.  HENT:  Positive for hearing loss. Negative for congestion and voice change.   Respiratory:  Negative for cough, chest tightness, shortness of breath and wheezing.   Cardiovascular:  Positive for leg swelling. Negative for chest pain and palpitations.  Gastrointestinal:  Negative for abdominal distention, abdominal pain, constipation and diarrhea.  Genitourinary:  Negative for difficulty urinating, dysuria and urgency.  Musculoskeletal:  Positive for arthralgias and gait problem.  Skin:  Negative for color change.  Neurological:  Negative for dizziness, seizures, speech difficulty, weakness and headaches.  Psychiatric/Behavioral:  Negative for behavioral problems and sleep disturbance. The patient is not nervous/anxious.     Health Maintenance  Topic Date Due   Medicare Annual Wellness (AWV)  Never done   COVID-19 Vaccine (4 - 2023-24 season) 12/12/2022 (Originally 09/08/2022)   DTaP/Tdap/Td (3 - Td or Tdap) 12/21/2025   Pneumonia Vaccine 42+ Years old  Completed   INFLUENZA VACCINE  Completed   DEXA SCAN  Completed   Zoster Vaccines- Shingrix  Completed   HPV VACCINES  Aged Out    Physical Exam: Vitals:   10/27/22 1510  BP: (!) 150/90  Pulse: 84  Resp: 18  Temp: 97.7 F (36.5 C)  SpO2: 97%  Weight: 209 lb (94.8 kg)  Height: 5' 3"$  (1.6 m)   Body mass index is 37.02 kg/m. Physical Exam Vitals and nursing note reviewed.  Constitutional:      Appearance: Normal appearance. She is obese.  HENT:     Head: Normocephalic and atraumatic.     Nose: Nose normal.     Mouth/Throat:     Mouth: Mucous membranes are moist.  Eyes:     Extraocular Movements: Extraocular movements intact.      Conjunctiva/sclera: Conjunctivae normal.     Pupils: Pupils are equal, round, and reactive to light.  Cardiovascular:     Rate and Rhythm: Normal rate and regular rhythm.     Heart sounds: No murmur heard. Pulmonary:     Effort: Pulmonary effort is normal.     Breath sounds: No wheezing, rhonchi or rales.  Abdominal:     General: Bowel sounds are normal.     Palpations: Abdomen is soft.     Tenderness: There is no abdominal tenderness.  Musculoskeletal:     Cervical back: Normal range of motion and neck supple.     Right lower leg: Edema present.     Left lower leg: Edema present.  Comments: Trace edema BLE  Skin:    General: Skin is warm and dry.  Neurological:     General: No focal deficit present.     Mental Status: She is alert and oriented to person, place, and time. Mental status is at baseline.     Motor: No weakness.     Coordination: Coordination normal.     Gait: Gait abnormal.  Psychiatric:        Mood and Affect: Mood normal.        Behavior: Behavior normal.        Thought Content: Thought content normal.        Judgment: Judgment normal.     Labs reviewed: Basic Metabolic Panel: Recent Labs    03/24/22 1710 04/12/22 0745 10/04/22 0734  NA 141 139 138  K 3.7 3.8 4.3  CL 102 102 100  CO2 24 26 28  $ GLUCOSE 134* 104* 101*  BUN 20 19 20  $ CREATININE 1.00 1.01* 0.93  CALCIUM 9.5 9.4 9.4   Liver Function Tests: Recent Labs    03/24/22 1710 10/04/22 0734  AST 33 17  ALT 21 20  ALKPHOS 82  --   BILITOT 0.4 0.5  PROT 6.9 7.2  ALBUMIN 3.5  --    No results for input(s): "LIPASE", "AMYLASE" in the last 8760 hours. No results for input(s): "AMMONIA" in the last 8760 hours. CBC: Recent Labs    03/24/22 1710 04/12/22 0745  WBC 14.8* 9.3  NEUTROABS 12.8* 5,413  HGB 13.3 13.2  HCT 41.9 40.7  MCV 89.1 86.8  PLT 241 300   Lipid Panel: Recent Labs    10/04/22 0734  CHOL 202*  HDL 52  LDLCALC 125*  TRIG 136  CHOLHDL 3.9   Lab Results   Component Value Date   HGBA1C 6.4 (H) 10/04/2022    Procedures since last visit: No results found.  Assessment/Plan  Hyperlipidemia Hx of hyperlipidemia, off Atorvastatin, LDL 125 10/04/22  Seizure disorder ([place]) Seizure, ED eval 09/11/23 for unresponsive 10-15 minutes, bit her tongue, off Depakote, saw neurology 05/19/22  CKD (chronic kidney disease) stage 3, GFR 30-59 ml/min (HCC) Bun/creat 20/0.93 10/04/22  Prediabetes Hgb A1c 6.4 10/04/22  Essential hypertension  elevated Bp, asymptomatic. May Try Lisinopril 51m qd.    Labs/tests ordered:  none  Next appt:  11/17/2022

## 2022-10-27 NOTE — Assessment & Plan Note (Signed)
Bun/creat 20/0.93 10/04/22

## 2022-10-27 NOTE — Assessment & Plan Note (Addendum)
elevated Bp, asymptomatic. May Try Lisinopril 9m qd.

## 2022-10-27 NOTE — Assessment & Plan Note (Signed)
Hgb A1c 6.4 10/04/22

## 2022-10-27 NOTE — Assessment & Plan Note (Signed)
Seizure, ED eval 09/11/23 for unresponsive 10-15 minutes, bit her tongue, off Depakote, saw neurology 05/19/22

## 2022-10-27 NOTE — Assessment & Plan Note (Addendum)
Hx of hyperlipidemia, off Atorvastatin, LDL 125 10/04/22

## 2022-10-28 ENCOUNTER — Encounter: Payer: Self-pay | Admitting: Nurse Practitioner

## 2022-10-31 DIAGNOSIS — M6281 Muscle weakness (generalized): Secondary | ICD-10-CM | POA: Diagnosis not present

## 2022-10-31 DIAGNOSIS — R262 Difficulty in walking, not elsewhere classified: Secondary | ICD-10-CM | POA: Diagnosis not present

## 2022-11-02 DIAGNOSIS — R262 Difficulty in walking, not elsewhere classified: Secondary | ICD-10-CM | POA: Diagnosis not present

## 2022-11-02 DIAGNOSIS — M6281 Muscle weakness (generalized): Secondary | ICD-10-CM | POA: Diagnosis not present

## 2022-11-03 DIAGNOSIS — M6281 Muscle weakness (generalized): Secondary | ICD-10-CM | POA: Diagnosis not present

## 2022-11-03 DIAGNOSIS — R262 Difficulty in walking, not elsewhere classified: Secondary | ICD-10-CM | POA: Diagnosis not present

## 2022-11-08 DIAGNOSIS — R262 Difficulty in walking, not elsewhere classified: Secondary | ICD-10-CM | POA: Diagnosis not present

## 2022-11-08 DIAGNOSIS — M6281 Muscle weakness (generalized): Secondary | ICD-10-CM | POA: Diagnosis not present

## 2022-11-09 DIAGNOSIS — R262 Difficulty in walking, not elsewhere classified: Secondary | ICD-10-CM | POA: Diagnosis not present

## 2022-11-09 DIAGNOSIS — M6281 Muscle weakness (generalized): Secondary | ICD-10-CM | POA: Diagnosis not present

## 2022-11-11 DIAGNOSIS — M6281 Muscle weakness (generalized): Secondary | ICD-10-CM | POA: Diagnosis not present

## 2022-11-11 DIAGNOSIS — R262 Difficulty in walking, not elsewhere classified: Secondary | ICD-10-CM | POA: Diagnosis not present

## 2022-11-16 DIAGNOSIS — R262 Difficulty in walking, not elsewhere classified: Secondary | ICD-10-CM | POA: Diagnosis not present

## 2022-11-16 DIAGNOSIS — M6281 Muscle weakness (generalized): Secondary | ICD-10-CM | POA: Diagnosis not present

## 2022-11-17 ENCOUNTER — Ambulatory Visit (INDEPENDENT_AMBULATORY_CARE_PROVIDER_SITE_OTHER): Payer: Medicare Other | Admitting: Nurse Practitioner

## 2022-11-17 ENCOUNTER — Encounter: Payer: Self-pay | Admitting: Nurse Practitioner

## 2022-11-17 VITALS — BP 130/80 | HR 77 | Temp 97.3°F | Resp 18 | Ht 63.0 in | Wt 208.1 lb

## 2022-11-17 DIAGNOSIS — I10 Essential (primary) hypertension: Secondary | ICD-10-CM | POA: Diagnosis not present

## 2022-11-17 DIAGNOSIS — Z0000 Encounter for general adult medical examination without abnormal findings: Secondary | ICD-10-CM | POA: Diagnosis not present

## 2022-11-17 DIAGNOSIS — R4189 Other symptoms and signs involving cognitive functions and awareness: Secondary | ICD-10-CM | POA: Insufficient documentation

## 2022-11-17 DIAGNOSIS — R413 Other amnesia: Secondary | ICD-10-CM

## 2022-11-17 DIAGNOSIS — Z1231 Encounter for screening mammogram for malignant neoplasm of breast: Secondary | ICD-10-CM

## 2022-11-17 NOTE — Progress Notes (Signed)
Subjective:   Katherine Obrien is a 82 y.o. female who presents for Medicare Annual (Subsequent) preventive examination in the clinic @ [place].  Cardiac Risk Factors include: advanced age (>81mn, >>2women);dyslipidemia;obesity (BMI >30kg/m2)     Objective:    Today's Vitals   11/17/22 1506 11/17/22 1552  BP: 130/80   Pulse: 77   Resp: 18   Temp: (!) 97.3 F (36.3 C)   SpO2: 96%   Weight: 208 lb 2 oz (94.4 kg)   Height: '5\' 3"'$  (1.6 m)   PainSc:  2    Body mass index is 36.87 kg/m.     11/17/2022    3:04 PM 10/27/2022    9:34 AM 09/29/2022    1:27 PM 05/12/2022    9:38 AM 04/06/2022    1:38 PM 05/19/2021    6:38 PM 07/03/2020    3:15 PM  Advanced Directives  Does Patient Have a Medical Advance Directive? Yes Yes No Yes Yes No Yes  Type of [place]amed[place]of [place] will;Out of facility DNR (pink MOST or yellow form) [place]Living will;Out of facility DNR (pink MOST or yellow form)  [place]alake[place]uaLiving will;Out of facility DNR (pink MOST or yellow form) [place]Living will;Out of facility DNR (pink MOST or yellow form)  Out of facility DNR (pink MOST or yellow form)  Does patient want to make changes to medical advance directive? No - Patient declined No - Patient declined  No - Patient declined No - Patient declined  No - Patient declined  Copy of H[place]oin Chart? Yes - validated most recent copy scanned in chart (See row information) Yes - validated most recent copy scanned in chart (See row information)  Yes - validated most recent copy scanned in chart (See row information) Yes - validated most recent copy scanned in chart (See row information)    Would patient like information on creating a medical advance directive?   No - Patient declined      Pre-existing out of facility DNR order (yellow form or pink MOST form)       Yellow form placed in chart (order  not valid for inpatient use);Pink MOST form placed in chart (order not valid for inpatient use)    Current Medications (verified) Outpatient Encounter Medications as of 11/17/2022  Medication Sig   Cholecalciferol (VITAMIN D-3) 25 MCG (1000 UT) CAPS TAKE 1 CAPSULE DAILY WITH BREAKFAST.   ketoconazole (NIZORAL) 2 % shampoo Apply 1 application topically once a week.    Multiple Vitamins-Minerals (ONE-A-DAY WOMENS 50 PLUS) TABS Take 1 tablet by mouth daily with breakfast.   Multiple Vitamins-Minerals (PRESERVISION/LUTEIN) CAPS Take 2 capsules by mouth daily with breakfast.   Polyethyl Glycol-Propyl Glycol (SYSTANE) 0.4-0.3 % SOLN Place 1-2 drops into both eyes 3 (three) times daily as needed (for dryness).    [DISCONTINUED] lisinopril (ZESTRIL) 20 MG tablet Take 0.5 tablets (10 mg total) by mouth daily.   No facility-administered encounter medications on file as of 11/17/2022.    Allergies (verified) Barbiturates, Cefdinir, Phenobarbital, Tegretol [carbamazepine], and Zonisamide   History: Past Medical History:  Diagnosis Date   Arthritis    Cancer ([facility]    skin   Cataract 2010   Dr. MAlexander Mt Dr.Jerlyn Ly@ [facility]   Epilepsy ([facility] 08/11/2015   Hyperlipidemia 12/08/2015   Hypertension    Polyp of colon 08/11/2015   Past Surgical History:  Procedure Laterality Date  [place]   COLON SURGERY  09/12/2006   FEMUR SURGERY  2018   Broken   TONSILLECTOMY  09/12/1944   Family History  Problem Relation Age of Onset   Osteoporosis Mother    Dementia Mother    Arthritis Mother    Stroke Father 33       mini   Dementia Father    CVA Father    Cancer Sister        breast   Dementia Sister        severe   Alzheimer's disease Sister    Diabetes Sister    Hypertension Sister    Stroke Paternal Grandfather        1970   Social History   Socioeconomic History   Marital status: Widowed    Spouse name: Not on file   Number  of children: Not on file   Years of education: Not on file   Highest education level: Not on file  Occupational History   Occupation: Electrical engineer    Comment: retired  Tobacco Use   Smoking status: Never   Smokeless tobacco: Never  Vaping Use   Vaping Use: Never used  Substance and Sexual Activity   Alcohol use: Never   Drug use: Never   Sexual activity: Not Currently  Other Topics Concern   Not on file  Social History Narrative   Social History     Socioeconomic History       Marital status: Widowed       Number of children:2       Years of education:  AB in Art [phone])       Highest education level: Bachelors in      Occupational History : Engineer, 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       Food insecurity:         Worry: Not on file         Inability: Not on file       Transportation needs:         Medical: Not on file         Non-medical: Not on file     Tobacco Use       Smoking status: Not on file     Substance and Sexual Activity       Alcohol use: Not on file       Drug use: Not on file       Sexual activity: Not on file     Lifestyle       Physical activity:         Days per week: Not on file         Minutes per session: A little       Stress: Not on file     Relationships       Social connections:         Talks on phone: Not on file         Gets together: Not on file         Attends religious service: Not on file         Active member of club or organization: Not on file         Attends meetings of clubs or organizations: Not on file  Relationship status: Not on file       Intimate partner violence:         Fear of current or ex partner: Not on file         Emotionally abused: Not on file         Physically abused: Not on file         Forced sexual activity: Not on file     Other Topics       Concerns:         Not on file     Social History Narrative       Not on file      Social  Determinants of Health   Financial Resource Strain: Not on file  Food Insecurity: Not on file  Transportation Needs: Not on file  Physical Activity: Not on file  Stress: Not on file  Social Connections: Not on file    Tobacco Counseling Counseling given: Not Answered   Clinical Intake:  Pre-visit preparation completed: Yes  Pain : 0-10 Pain Score: 2  Pain Location: Arm Pain Orientation: Left, Upper Pain Descriptors / Indicators: Aching Pain Onset: More than a month ago Pain Frequency: Occasional Pain Relieving Factors: inj, keep on going. Effect of Pain on Daily Activities: unable to lift  Pain Relieving Factors: inj, keep on going.  BMI - recorded: 36.87 Nutritional Status: BMI > 30  Obese Nutritional Risks: None Diabetes: No  How often do you need to have someone help you when you read instructions, pamphlets, or other written materials from your doctor or pharmacy?: 1 - Never What is the last grade level you completed in school?: college  Diabetic?no  Interpreter Needed?: No  Information entered by :: Catheleen Langhorne Bretta Bang NP   Activities of Daily Living    11/17/2022    4:00 PM  In your present state of health, do you have any difficulty performing the following activities:  Hearing? 0  Vision? 0  Comment glasses  Difficulty concentrating or making decisions? 0  Walking or climbing stairs? 1  Dressing or bathing? 0  Doing errands, shopping? 0  Preparing Food and eating ? N  Using the Toilet? N  In the past six months, have you accidently leaked urine? Y  Comment pads  Do you have problems with loss of bowel control? N  Managing your Medications? N  Managing your Finances? N    Patient Care Team: Catelyn Friel X, NP as PCP - General (Internal Medicine) Rosita Fire, MD as Referring Physician (Neurology) Dorothe Pea, Jeri Lager, MD as Referring Physician (Dermatology)  Indicate any recent Medical Services you may have received from other than [facility] providers  in the past year (date may be approximate).     Assessment:   This is a routine wellness examination for Katherine Obrien.  Hearing/Vision screen Hearing Screening - Comments:: No problem with her hearing Vision Screening - Comments:: No Problem with her vision and does wear glasses  Dietary issues and exercise activities discussed: Current Exercise Habits: The patient does not participate in regular exercise at present, Exercise limited by: orthopedic condition(s)   Goals Addressed             This Visit's Progress    Weight (lb) < 200 lb (90.7 kg)   208 lb 2 oz (94.4 kg)    Life style modification, reduce soda to one a week.        Depression Screen    11/17/2022    3:51 PM  11/17/2022    3:38 PM  PHQ 2/9 Scores  PHQ - 2 Score 0 0  PHQ- 9 Score  0    Fall Risk    11/17/2022    3:51 PM 11/17/2022    3:04 PM 10/27/2022    9:35 AM 05/12/2022    9:38 AM 04/06/2022    1:38 PM  [place] in the past year? 0 0 0 0 0  Number falls in past yr:  0 0 0 0  Injury with Fall?  0 0 0 0  Risk for fall due to :  No Fall Risks No Fall Risks;Other (Comment) Other (Comment) No Fall Risks  Follow up  Falls evaluation completed Falls evaluation completed Falls evaluation completed Falls evaluation completed    [place]:  Any stairs in or around the home? Yes  If so, are there any without handrails? No  Home free of loose throw rugs in walkways, pet beds, electrical cords, etc? Yes  Adequate lighting in your home to reduce risk of falls? Yes   ASSISTIVE DEVICES UTILIZED TO PREVENT FALLS:  Life alert? No  Use of a cane, walker or w/c? Yes  Grab bars in the bathroom? Yes  Shower chair or bench in shower? Yes  Elevated toilet seat or a handicapped toilet? Yes   TIMED UP AND GO:  Was the test performed? Yes .  Length of time to ambulate 10 feet: 8 sec.   Gait steady and fast with assistive device  Cognitive Function:    11/17/2022    3:39 PM   MMSE - Mini Mental State Exam  Orientation to time 5  Orientation to Place 5  Registration 3  Attention/ Calculation 5  Recall 0  Language- name 2 objects 2  Language- repeat 1  Language- follow 3 step command 3  Language- read & follow direction 1  Write a sentence 1  Copy design 1  Total score 27        Immunizations Immunization History  Administered Date(s) Administered   Fluad Quad(high Dose 65+) 07/03/2020, 06/28/2021, 06/28/2022   Influenza, High Dose Seasonal PF 06/26/2019   Influenza-Unspecified 06/29/2015, 05/21/2016   Moderna Covid-19 Vaccine Bivalent Booster 64yr & up 07/14/2022   Moderna Sars-Covid-2 Vaccination 09/16/2019, 10/14/2019   Pneumococcal Conjugate-13 01/19/2014   Pneumococcal Polysaccharide-23 07/28/2009   Respiratory Syncytial Virus Vaccine,Recomb Aduvanted(Arexvy) 09/09/2022   Td 07/25/2006   Tdap 12/22/2015   Zoster Recombinat (Shingrix) 05/27/2017, 11/24/2017    TDAP status: Up to date  Flu Vaccine status: Up to date  Pneumococcal vaccine status: Up to date  Covid-19 vaccine status: Completed vaccines  Qualifies for Shingles Vaccine? Yes   Zostavax completed Yes   Shingrix Completed?: Yes  Screening Tests Health Maintenance  Topic Date Due   COVID-19 Vaccine (4 - 2023-24 season) 12/12/2022 (Originally 09/08/2022)   Medicare Annual Wellness (AWV)  11/17/2023   DTaP/Tdap/Td (3 - Td or Tdap) 12/21/2025   Pneumonia Vaccine 82 Years old  Completed   INFLUENZA VACCINE  Completed   DEXA SCAN  Completed   Zoster Vaccines- Shingrix  Completed   HPV VACCINES  Aged Out    Health Maintenance  There are no preventive care reminders to display for this patient. Colonoscopy aged out.   Mammogram, will do.   DEXA scan declined.   Lung Cancer Screening: (Low Dose CT Chest recommended if Age 84-80years, 30 pack-year currently smoking OR have quit w/in 15years.) does not qualify.  Additional Screening: none  Hepatitis C  Screening: does not qualify;   Vision Screening: Recommended annual ophthalmology exams for early detection of glaucoma and other disorders of the eye. Is the patient up to date with their annual eye exam?  Yes  Who is the provider or what is the name of the office in which the patient attends annual eye exams? [facility] If pt is not established with a provider, would they like to be referred to a provider to establish care? No .   Dental Screening: Recommended annual dental exams for proper oral hygiene  Community Resource Referral / Chronic Care Management: CRR required this visit?  No   CCM required this visit?  No      Plan:     I have personally reviewed and noted the following in the patient's chart:   Medical and social history Use of alcohol, tobacco or illicit drugs  Current medications and supplements including opioid prescriptions. Patient is not currently taking opioid prescriptions. Functional ability and status Nutritional status Physical activity Advanced directives List of other physicians Hospitalizations, surgeries, and ER visits in previous 12 months Vitals Screenings to include cognitive, depression, and falls Referrals and appointments  In addition, I have reviewed and discussed with patient certain preventive protocols, quality metrics, and best practice recommendations. A written personalized care plan for preventive services as well as general preventive health recommendations were provided to patient.     Pyper Olexa X Doctor Sheahan, NP   11/17/2022

## 2022-11-18 DIAGNOSIS — R262 Difficulty in walking, not elsewhere classified: Secondary | ICD-10-CM | POA: Diagnosis not present

## 2022-11-18 DIAGNOSIS — M6281 Muscle weakness (generalized): Secondary | ICD-10-CM | POA: Diagnosis not present

## 2022-11-21 DIAGNOSIS — R262 Difficulty in walking, not elsewhere classified: Secondary | ICD-10-CM | POA: Diagnosis not present

## 2022-11-21 DIAGNOSIS — M6281 Muscle weakness (generalized): Secondary | ICD-10-CM | POA: Diagnosis not present

## 2022-11-22 ENCOUNTER — Ambulatory Visit
Admission: RE | Admit: 2022-11-22 | Discharge: 2022-11-22 | Disposition: A | Discharge status: Home / Self Care | Payer: Medicare Other | Source: Ambulatory Visit | Attending: Nurse Practitioner | Admitting: Nurse Practitioner

## 2022-11-22 DIAGNOSIS — Z0000 Encounter for general adult medical examination without abnormal findings: Secondary | ICD-10-CM

## 2022-11-22 DIAGNOSIS — Z1231 Encounter for screening mammogram for malignant neoplasm of breast: Secondary | ICD-10-CM

## 2022-11-23 DIAGNOSIS — R262 Difficulty in walking, not elsewhere classified: Secondary | ICD-10-CM | POA: Diagnosis not present

## 2022-11-23 DIAGNOSIS — M6281 Muscle weakness (generalized): Secondary | ICD-10-CM | POA: Diagnosis not present

## 2022-11-25 ENCOUNTER — Other Ambulatory Visit: Payer: Self-pay | Admitting: Nurse Practitioner

## 2022-11-25 DIAGNOSIS — R928 Other abnormal and inconclusive findings on diagnostic imaging of breast: Secondary | ICD-10-CM

## 2022-11-28 DIAGNOSIS — M6281 Muscle weakness (generalized): Secondary | ICD-10-CM | POA: Diagnosis not present

## 2022-11-28 DIAGNOSIS — R262 Difficulty in walking, not elsewhere classified: Secondary | ICD-10-CM | POA: Diagnosis not present

## 2022-11-30 DIAGNOSIS — M6281 Muscle weakness (generalized): Secondary | ICD-10-CM | POA: Diagnosis not present

## 2022-11-30 DIAGNOSIS — R262 Difficulty in walking, not elsewhere classified: Secondary | ICD-10-CM | POA: Diagnosis not present

## 2022-12-02 DIAGNOSIS — M6281 Muscle weakness (generalized): Secondary | ICD-10-CM | POA: Diagnosis not present

## 2022-12-02 DIAGNOSIS — R262 Difficulty in walking, not elsewhere classified: Secondary | ICD-10-CM | POA: Diagnosis not present

## 2022-12-05 DIAGNOSIS — M6281 Muscle weakness (generalized): Secondary | ICD-10-CM | POA: Diagnosis not present

## 2022-12-05 DIAGNOSIS — R262 Difficulty in walking, not elsewhere classified: Secondary | ICD-10-CM | POA: Diagnosis not present

## 2022-12-07 DIAGNOSIS — R262 Difficulty in walking, not elsewhere classified: Secondary | ICD-10-CM | POA: Diagnosis not present

## 2022-12-07 DIAGNOSIS — M6281 Muscle weakness (generalized): Secondary | ICD-10-CM | POA: Diagnosis not present

## 2022-12-09 DIAGNOSIS — R262 Difficulty in walking, not elsewhere classified: Secondary | ICD-10-CM | POA: Diagnosis not present

## 2022-12-09 DIAGNOSIS — M6281 Muscle weakness (generalized): Secondary | ICD-10-CM | POA: Diagnosis not present

## 2022-12-12 DIAGNOSIS — M6281 Muscle weakness (generalized): Secondary | ICD-10-CM | POA: Diagnosis not present

## 2022-12-12 DIAGNOSIS — R262 Difficulty in walking, not elsewhere classified: Secondary | ICD-10-CM | POA: Diagnosis not present

## 2022-12-14 DIAGNOSIS — M6281 Muscle weakness (generalized): Secondary | ICD-10-CM | POA: Diagnosis not present

## 2022-12-14 DIAGNOSIS — R262 Difficulty in walking, not elsewhere classified: Secondary | ICD-10-CM | POA: Diagnosis not present

## 2022-12-16 DIAGNOSIS — M6281 Muscle weakness (generalized): Secondary | ICD-10-CM | POA: Diagnosis not present

## 2022-12-16 DIAGNOSIS — R262 Difficulty in walking, not elsewhere classified: Secondary | ICD-10-CM | POA: Diagnosis not present

## 2022-12-21 DIAGNOSIS — R262 Difficulty in walking, not elsewhere classified: Secondary | ICD-10-CM | POA: Diagnosis not present

## 2022-12-21 DIAGNOSIS — M6281 Muscle weakness (generalized): Secondary | ICD-10-CM | POA: Diagnosis not present

## 2022-12-23 DIAGNOSIS — M6281 Muscle weakness (generalized): Secondary | ICD-10-CM | POA: Diagnosis not present

## 2022-12-23 DIAGNOSIS — R262 Difficulty in walking, not elsewhere classified: Secondary | ICD-10-CM | POA: Diagnosis not present

## 2022-12-26 DIAGNOSIS — R262 Difficulty in walking, not elsewhere classified: Secondary | ICD-10-CM | POA: Diagnosis not present

## 2022-12-26 DIAGNOSIS — M6281 Muscle weakness (generalized): Secondary | ICD-10-CM | POA: Diagnosis not present

## 2022-12-28 DIAGNOSIS — R262 Difficulty in walking, not elsewhere classified: Secondary | ICD-10-CM | POA: Diagnosis not present

## 2022-12-28 DIAGNOSIS — M6281 Muscle weakness (generalized): Secondary | ICD-10-CM | POA: Diagnosis not present

## 2022-12-30 DIAGNOSIS — M6281 Muscle weakness (generalized): Secondary | ICD-10-CM | POA: Diagnosis not present

## 2022-12-30 DIAGNOSIS — R262 Difficulty in walking, not elsewhere classified: Secondary | ICD-10-CM | POA: Diagnosis not present

## 2023-01-02 DIAGNOSIS — M6281 Muscle weakness (generalized): Secondary | ICD-10-CM | POA: Diagnosis not present

## 2023-01-02 DIAGNOSIS — R262 Difficulty in walking, not elsewhere classified: Secondary | ICD-10-CM | POA: Diagnosis not present

## 2023-01-04 DIAGNOSIS — R262 Difficulty in walking, not elsewhere classified: Secondary | ICD-10-CM | POA: Diagnosis not present

## 2023-01-04 DIAGNOSIS — M6281 Muscle weakness (generalized): Secondary | ICD-10-CM | POA: Diagnosis not present

## 2023-01-06 DIAGNOSIS — R262 Difficulty in walking, not elsewhere classified: Secondary | ICD-10-CM | POA: Diagnosis not present

## 2023-01-06 DIAGNOSIS — M6281 Muscle weakness (generalized): Secondary | ICD-10-CM | POA: Diagnosis not present

## 2023-01-09 DIAGNOSIS — M6281 Muscle weakness (generalized): Secondary | ICD-10-CM | POA: Diagnosis not present

## 2023-01-09 DIAGNOSIS — R262 Difficulty in walking, not elsewhere classified: Secondary | ICD-10-CM | POA: Diagnosis not present

## 2023-01-11 DIAGNOSIS — R278 Other lack of coordination: Secondary | ICD-10-CM | POA: Diagnosis not present

## 2023-01-11 DIAGNOSIS — M6281 Muscle weakness (generalized): Secondary | ICD-10-CM | POA: Diagnosis not present

## 2023-01-11 DIAGNOSIS — R262 Difficulty in walking, not elsewhere classified: Secondary | ICD-10-CM | POA: Diagnosis not present

## 2023-01-13 DIAGNOSIS — M6281 Muscle weakness (generalized): Secondary | ICD-10-CM | POA: Diagnosis not present

## 2023-01-13 DIAGNOSIS — R262 Difficulty in walking, not elsewhere classified: Secondary | ICD-10-CM | POA: Diagnosis not present

## 2023-01-13 DIAGNOSIS — R278 Other lack of coordination: Secondary | ICD-10-CM | POA: Diagnosis not present

## 2023-01-16 DIAGNOSIS — R278 Other lack of coordination: Secondary | ICD-10-CM | POA: Diagnosis not present

## 2023-01-16 DIAGNOSIS — R262 Difficulty in walking, not elsewhere classified: Secondary | ICD-10-CM | POA: Diagnosis not present

## 2023-01-16 DIAGNOSIS — M6281 Muscle weakness (generalized): Secondary | ICD-10-CM | POA: Diagnosis not present

## 2023-01-20 DIAGNOSIS — R278 Other lack of coordination: Secondary | ICD-10-CM | POA: Diagnosis not present

## 2023-01-20 DIAGNOSIS — M6281 Muscle weakness (generalized): Secondary | ICD-10-CM | POA: Diagnosis not present

## 2023-01-20 DIAGNOSIS — R262 Difficulty in walking, not elsewhere classified: Secondary | ICD-10-CM | POA: Diagnosis not present

## 2023-01-23 DIAGNOSIS — M6281 Muscle weakness (generalized): Secondary | ICD-10-CM | POA: Diagnosis not present

## 2023-01-23 DIAGNOSIS — R262 Difficulty in walking, not elsewhere classified: Secondary | ICD-10-CM | POA: Diagnosis not present

## 2023-01-23 DIAGNOSIS — R278 Other lack of coordination: Secondary | ICD-10-CM | POA: Diagnosis not present

## 2023-01-25 DIAGNOSIS — R278 Other lack of coordination: Secondary | ICD-10-CM | POA: Diagnosis not present

## 2023-01-25 DIAGNOSIS — M6281 Muscle weakness (generalized): Secondary | ICD-10-CM | POA: Diagnosis not present

## 2023-01-25 DIAGNOSIS — R262 Difficulty in walking, not elsewhere classified: Secondary | ICD-10-CM | POA: Diagnosis not present

## 2023-01-27 ENCOUNTER — Emergency Department (HOSPITAL_COMMUNITY): Payer: Medicare Other

## 2023-01-27 ENCOUNTER — Inpatient Hospital Stay (HOSPITAL_COMMUNITY)
Admission: EM | Admit: 2023-01-27 | Discharge: 2023-01-31 | DRG: 312 | Disposition: A | Discharge status: Home Health | Payer: Medicare Other | Attending: Internal Medicine | Admitting: Internal Medicine

## 2023-01-27 ENCOUNTER — Other Ambulatory Visit: Payer: Self-pay

## 2023-01-27 ENCOUNTER — Encounter (HOSPITAL_COMMUNITY): Payer: Self-pay

## 2023-01-27 DIAGNOSIS — R946 Abnormal results of thyroid function studies: Secondary | ICD-10-CM | POA: Diagnosis present

## 2023-01-27 DIAGNOSIS — I10 Essential (primary) hypertension: Secondary | ICD-10-CM

## 2023-01-27 DIAGNOSIS — G40909 Epilepsy, unspecified, not intractable, without status epilepticus: Secondary | ICD-10-CM

## 2023-01-27 DIAGNOSIS — Z820 Family history of epilepsy and other diseases of the nervous system: Secondary | ICD-10-CM

## 2023-01-27 DIAGNOSIS — K769 Liver disease, unspecified: Secondary | ICD-10-CM | POA: Diagnosis present

## 2023-01-27 DIAGNOSIS — Z85828 Personal history of other malignant neoplasm of skin: Secondary | ICD-10-CM | POA: Diagnosis not present

## 2023-01-27 DIAGNOSIS — R9389 Abnormal findings on diagnostic imaging of other specified body structures: Secondary | ICD-10-CM

## 2023-01-27 DIAGNOSIS — R7989 Other specified abnormal findings of blood chemistry: Secondary | ICD-10-CM

## 2023-01-27 DIAGNOSIS — R059 Cough, unspecified: Secondary | ICD-10-CM | POA: Diagnosis not present

## 2023-01-27 DIAGNOSIS — J9601 Acute respiratory failure with hypoxia: Secondary | ICD-10-CM

## 2023-01-27 DIAGNOSIS — R413 Other amnesia: Secondary | ICD-10-CM | POA: Diagnosis present

## 2023-01-27 DIAGNOSIS — Z8262 Family history of osteoporosis: Secondary | ICD-10-CM | POA: Diagnosis not present

## 2023-01-27 DIAGNOSIS — R5381 Other malaise: Secondary | ICD-10-CM | POA: Diagnosis present

## 2023-01-27 DIAGNOSIS — Z885 Allergy status to narcotic agent status: Secondary | ICD-10-CM

## 2023-01-27 DIAGNOSIS — Z8249 Family history of ischemic heart disease and other diseases of the circulatory system: Secondary | ICD-10-CM

## 2023-01-27 DIAGNOSIS — Z823 Family history of stroke: Secondary | ICD-10-CM | POA: Diagnosis not present

## 2023-01-27 DIAGNOSIS — N183 Chronic kidney disease, stage 3 unspecified: Secondary | ICD-10-CM | POA: Diagnosis present

## 2023-01-27 DIAGNOSIS — R55 Syncope and collapse: Secondary | ICD-10-CM | POA: Diagnosis not present

## 2023-01-27 DIAGNOSIS — Z90710 Acquired absence of both cervix and uterus: Secondary | ICD-10-CM

## 2023-01-27 DIAGNOSIS — J189 Pneumonia, unspecified organism: Secondary | ICD-10-CM | POA: Diagnosis not present

## 2023-01-27 DIAGNOSIS — I700 Atherosclerosis of aorta: Secondary | ICD-10-CM | POA: Diagnosis not present

## 2023-01-27 DIAGNOSIS — R4020 Unspecified coma: Secondary | ICD-10-CM | POA: Diagnosis not present

## 2023-01-27 DIAGNOSIS — Z888 Allergy status to other drugs, medicaments and biological substances status: Secondary | ICD-10-CM

## 2023-01-27 DIAGNOSIS — Z66 Do not resuscitate: Secondary | ICD-10-CM | POA: Diagnosis not present

## 2023-01-27 DIAGNOSIS — R6520 Severe sepsis without septic shock: Secondary | ICD-10-CM | POA: Diagnosis present

## 2023-01-27 DIAGNOSIS — Z8261 Family history of arthritis: Secondary | ICD-10-CM | POA: Diagnosis not present

## 2023-01-27 DIAGNOSIS — I129 Hypertensive chronic kidney disease with stage 1 through stage 4 chronic kidney disease, or unspecified chronic kidney disease: Secondary | ICD-10-CM | POA: Diagnosis present

## 2023-01-27 DIAGNOSIS — E785 Hyperlipidemia, unspecified: Secondary | ICD-10-CM | POA: Diagnosis present

## 2023-01-27 DIAGNOSIS — Z79899 Other long term (current) drug therapy: Secondary | ICD-10-CM

## 2023-01-27 DIAGNOSIS — R0902 Hypoxemia: Secondary | ICD-10-CM

## 2023-01-27 DIAGNOSIS — R569 Unspecified convulsions: Secondary | ICD-10-CM | POA: Diagnosis not present

## 2023-01-27 DIAGNOSIS — E876 Hypokalemia: Secondary | ICD-10-CM | POA: Diagnosis present

## 2023-01-27 DIAGNOSIS — A419 Sepsis, unspecified organism: Secondary | ICD-10-CM | POA: Diagnosis present

## 2023-01-27 DIAGNOSIS — Z86010 Personal history of colonic polyps: Secondary | ICD-10-CM

## 2023-01-27 DIAGNOSIS — N179 Acute kidney failure, unspecified: Secondary | ICD-10-CM | POA: Diagnosis not present

## 2023-01-27 DIAGNOSIS — J690 Pneumonitis due to inhalation of food and vomit: Secondary | ICD-10-CM

## 2023-01-27 DIAGNOSIS — Z803 Family history of malignant neoplasm of breast: Secondary | ICD-10-CM | POA: Diagnosis not present

## 2023-01-27 DIAGNOSIS — R6889 Other general symptoms and signs: Secondary | ICD-10-CM | POA: Diagnosis not present

## 2023-01-27 DIAGNOSIS — Z833 Family history of diabetes mellitus: Secondary | ICD-10-CM

## 2023-01-27 DIAGNOSIS — Z602 Problems related to living alone: Secondary | ICD-10-CM | POA: Diagnosis present

## 2023-01-27 LAB — BASIC METABOLIC PANEL
Anion gap: 12 (ref 5–15)
BUN: 22 mg/dL (ref 8–23)
CO2: 27 mmol/L (ref 22–32)
Calcium: 9.1 mg/dL (ref 8.9–10.3)
Chloride: 101 mmol/L (ref 98–111)
Creatinine, Ser: 1.15 mg/dL — ABNORMAL HIGH (ref 0.44–1.00)
GFR, Estimated: 47 mL/min — ABNORMAL LOW (ref >60)
Glucose, Bld: 149 mg/dL — ABNORMAL HIGH (ref 70–99)
Potassium: 3.5 mmol/L (ref 3.5–5.1)
Sodium: 140 mmol/L (ref 135–145)

## 2023-01-27 LAB — HEPATIC FUNCTION PANEL
ALT: 20 U/L (ref 0–44)
AST: 22 U/L (ref 15–41)
Albumin: 3.5 g/dL (ref 3.5–5.0)
Alkaline Phosphatase: 70 U/L (ref 38–126)
Bilirubin, Direct: <0.1 mg/dL (ref 0.0–0.2)
Total Bilirubin: 0.2 mg/dL — ABNORMAL LOW (ref 0.3–1.2)
Total Protein: 7 g/dL (ref 6.5–8.1)

## 2023-01-27 LAB — CBC
HCT: 42.8 % (ref 36.0–46.0)
Hemoglobin: 13.4 g/dL (ref 12.0–15.0)
MCH: 28.2 pg (ref 26.0–34.0)
MCHC: 31.3 g/dL (ref 30.0–36.0)
MCV: 90.1 fL (ref 80.0–100.0)
Platelets: 271 10*3/uL (ref 150–400)
RBC: 4.75 MIL/uL (ref 3.87–5.11)
RDW: 15.2 % (ref 11.5–15.5)
WBC: 13.5 10*3/uL — ABNORMAL HIGH (ref 4.0–10.5)
nRBC: 0 % (ref 0.0–0.2)

## 2023-01-27 LAB — LIPASE, BLOOD: Lipase: 57 U/L — ABNORMAL HIGH (ref 11–51)

## 2023-01-27 LAB — CBG MONITORING, ED: Glucose-Capillary: 142 mg/dL — ABNORMAL HIGH (ref 70–99)

## 2023-01-27 LAB — CARBAMAZEPINE LEVEL, TOTAL: Carbamazepine Lvl: <2 ug/mL — ABNORMAL LOW (ref 4.0–12.0)

## 2023-01-27 LAB — PHENYTOIN LEVEL, TOTAL: Phenytoin Lvl: <2.5 ug/mL — ABNORMAL LOW (ref 10.0–20.0)

## 2023-01-27 LAB — D-DIMER, QUANTITATIVE: D-Dimer, Quant: 2.39 ug/mL-FEU — ABNORMAL HIGH (ref 0.00–0.50)

## 2023-01-27 LAB — VALPROIC ACID LEVEL: Valproic Acid Lvl: <10 ug/mL — ABNORMAL LOW (ref 50.0–100.0)

## 2023-01-27 LAB — TROPONIN I (HIGH SENSITIVITY)
Troponin I (High Sensitivity): 5 ng/L (ref <18)
Troponin I (High Sensitivity): 7 ng/L (ref <18)

## 2023-01-27 LAB — PHENOBARBITAL LEVEL: Phenobarbital: <5 ug/mL — ABNORMAL LOW (ref 15.0–40.0)

## 2023-01-27 LAB — BRAIN NATRIURETIC PEPTIDE: B Natriuretic Peptide: 32.5 pg/mL (ref 0.0–100.0)

## 2023-01-27 LAB — TSH: TSH: 4.566 u[IU]/mL — ABNORMAL HIGH (ref 0.350–4.500)

## 2023-01-27 MED ORDER — LABETALOL HCL 5 MG/ML IV SOLN: 5.0000 mg | INTRAVENOUS | Status: DC | PRN

## 2023-01-27 MED ORDER — ONDANSETRON HCL 4 MG/2ML IJ SOLN
4.0000 mg | Freq: Four times a day (QID) | INTRAMUSCULAR | Status: DC | PRN
  Filled 2023-01-27: qty 2

## 2023-01-27 MED ORDER — ACETAMINOPHEN 325 MG PO TABS: 650.0000 mg | ORAL_TABLET | Freq: Four times a day (QID) | ORAL | Status: DC | PRN

## 2023-01-27 MED ORDER — SODIUM CHLORIDE 0.9 % IV BOLUS
1000.0000 mL | Freq: Once | INTRAVENOUS | Status: AC
  Administered 2023-01-27: 1000 mL via INTRAVENOUS

## 2023-01-27 MED ORDER — IOHEXOL 350 MG/ML SOLN
75.0000 mL | Freq: Once | INTRAVENOUS | Status: AC | PRN
  Administered 2023-01-27: 75 mL via INTRAVENOUS

## 2023-01-27 MED ORDER — SODIUM CHLORIDE 0.9 % IV SOLN
3.0000 g | Freq: Four times a day (QID) | INTRAVENOUS | Status: DC
  Administered 2023-01-27 – 2023-01-31 (×14): 3 g via INTRAVENOUS
  Filled 2023-01-27 (×14): qty 8

## 2023-01-27 MED ORDER — ENOXAPARIN SODIUM 40 MG/0.4ML IJ SOSY
40.0000 mg | PREFILLED_SYRINGE | INTRAMUSCULAR | Status: DC
  Administered 2023-01-27 – 2023-01-30 (×4): 40 mg via SUBCUTANEOUS
  Filled 2023-01-27 (×4): qty 0.4

## 2023-01-27 MED ORDER — ONDANSETRON HCL 4 MG/2ML IJ SOLN
4.0000 mg | Freq: Once | INTRAMUSCULAR | Status: AC
  Administered 2023-01-27: 4 mg via INTRAVENOUS
  Filled 2023-01-27: qty 2

## 2023-01-27 MED ORDER — SODIUM CHLORIDE 0.9 % IV SOLN
3.0000 g | Freq: Once | INTRAVENOUS | Status: AC
  Administered 2023-01-27: 3 g via INTRAVENOUS
  Filled 2023-01-27: qty 8

## 2023-01-27 NOTE — Progress Notes (Signed)
Pharmacy Antibiotic Note  Katherine Obrien is a 82 y.o. female admitted on 01/27/2023 with aspiration pneumonia.  Pharmacy has been consulted for Unasyn dosing.  Plan: Unasyn 3g IV q6h Follow up renal function, culture results, and clinical course.   Height: 5\' 3"  (160 cm) Weight: 90.7 kg (200 lb) IBW/kg (Calculated) : 52.4  Temp (24hrs), Avg:99.2 F (37.3 C), Min:98.1 F (36.7 C), Max:100.3 F (37.9 C)  Recent Labs  Lab 01/27/23 1353  WBC 13.5*  CREATININE 1.15*    Estimated Creatinine Clearance: 39.6 mL/min (A) (by C-G formula based on SCr of 1.15 mg/dL (H)).    Allergies  Allergen Reactions   Barbiturates Other (See Comments)    A small amount "overdosed" the patient- "affected my stability and balance" (might have been given at the same as another med?)   Cefdinir Other (See Comments)    A small amount "overdosed" the patient- "affected my stability and balance" (might have been given at the same as another med?)   Phenobarbital Other (See Comments)    A small amount "overdosed" the patient- "affected my stability and balance" (might have been given at the same as another med?)   Tegretol [Carbamazepine] Other (See Comments)    A small amount "overdosed" the patient- "affected my stability and balance" (might have been given at the same as another med?)   Zonisamide Other (See Comments)    Negative reaction    Antimicrobials this admission: 5/17 Unasyn >>   Dose adjustments this admission: n/a  Microbiology results: none  Thank you for allowing pharmacy to be a part of this patient's care.  Lynann Beaver PharmD, BCPS [facility] main pharmacy [phone] 01/27/2023 8:52 PM

## 2023-01-27 NOTE — ED Notes (Signed)
Pt ambulated to the restroom via wheelchair. - tolerated well.

## 2023-01-27 NOTE — ED Provider Notes (Signed)
[place] EMERGENCY DEPARTMENT AT [facility] Provider Note   CSN: [id number] Arrival date & time: 01/27/23  1319     History  Chief Complaint  Patient presents with   Seizures    Katherine Obrien is a 82 y.o. female.  The history is provided by the patient and medical records. No language interpreter was used.  Seizures Seizure activity on arrival: no   Shortness of Breath Severity:  Moderate Onset quality:  Sudden Timing:  Constant Chronicity:  New Relieved by:  Nothing Worsened by:  Nothing Ineffective treatments:  None tried Associated symptoms: cough, sputum production, syncope (vs seizure) and vomiting   Associated symptoms: no abdominal pain, no chest pain, no fever, no headaches, no neck pain, no rash and no wheezing   Risk factors: no hx of PE/DVT   Syncope versus seizure, likely     Home Medications Prior to Admission medications   Medication Sig Start Date End Date Taking? Authorizing Provider  Cholecalciferol (VITAMIN D-3) 25 MCG (1000 UT) CAPS TAKE 1 CAPSULE DAILY WITH BREAKFAST. 09/24/19   Mast, Man X, NP  ketoconazole (NIZORAL) 2 % shampoo Apply 1 application topically once a week.  04/23/19   [provider]  Multiple Vitamins-Minerals (ONE-A-DAY WOMENS 50 PLUS) TABS Take 1 tablet by mouth daily with breakfast.    [provider]  Multiple Vitamins-Minerals (PRESERVISION/LUTEIN) CAPS Take 2 capsules by mouth daily with breakfast.    [provider]  Polyethyl Glycol-Propyl Glycol (SYSTANE) 0.4-0.3 % SOLN Place 1-2 drops into both eyes 3 (three) times daily as needed (for dryness).     [provider]      Allergies    Barbiturates, Cefdinir, Phenobarbital, Tegretol [carbamazepine], and Zonisamide    Review of Systems   Review of Systems  Constitutional:  Positive for fatigue. Negative for fever.  HENT:  Negative for congestion.   Eyes:  Negative for visual disturbance.  Respiratory:  Positive for  cough, sputum production and shortness of breath. Negative for chest tightness, wheezing and stridor.   Cardiovascular:  Positive for leg swelling (chronic) and syncope (vs seizure). Negative for chest pain and palpitations.  Gastrointestinal:  Positive for nausea and vomiting. Negative for abdominal pain, constipation and diarrhea.  Genitourinary:  Negative for dysuria, flank pain and frequency.  Musculoskeletal:  Negative for back pain, neck pain and neck stiffness.  Skin:  Negative for rash and wound.  Neurological:  Positive for seizures and syncope. Negative for dizziness, weakness, light-headedness and headaches.  Psychiatric/Behavioral:  Negative for agitation and confusion.   All other systems reviewed and are negative.   Physical Exam Updated Vital Signs BP (!) 198/93 (BP Location: Left Arm)   Pulse (!) 111   Temp 98.1 F (36.7 C) (Oral)   Resp 20   Ht 5\' 3"  (1.6 m)   Wt 90.7 kg   SpO2 90%   BMI 35.43 kg/m  Physical Exam Vitals and nursing note reviewed.  Constitutional:      General: She is not in acute distress.    Appearance: She is well-developed. She is ill-appearing. She is not toxic-appearing or diaphoretic.  HENT:     Head: Normocephalic and atraumatic.     Nose: No congestion or rhinorrhea.     Mouth/Throat:     Pharynx: No oropharyngeal exudate or posterior oropharyngeal erythema.  Eyes:     Extraocular Movements: Extraocular movements intact.     Conjunctiva/sclera: Conjunctivae normal.     Pupils: Pupils are equal, round, and  reactive to light.  Cardiovascular:     Rate and Rhythm: Regular rhythm. Tachycardia present.     Heart sounds: No murmur heard. Pulmonary:     Effort: No respiratory distress.     Breath sounds: No stridor. Rhonchi present. No wheezing or rales.  Chest:     Chest wall: No tenderness.  Abdominal:     General: Abdomen is flat.     Palpations: Abdomen is soft.     Tenderness: There is no abdominal tenderness.  Musculoskeletal:         General: No swelling or tenderness.     Cervical back: Neck supple. No tenderness.  Skin:    General: Skin is warm and dry.     Capillary Refill: Capillary refill takes less than 2 seconds.     Findings: No erythema.  Neurological:     General: No focal deficit present.     Mental Status: She is alert.     Sensory: No sensory deficit.     Motor: No weakness.  Psychiatric:        Mood and Affect: Mood normal.     ED Results / Procedures / Treatments   Labs (all labs ordered are listed, but only abnormal results are displayed) Labs Reviewed  BASIC METABOLIC PANEL - Abnormal; Notable for the following components:      Result Value   Glucose, Bld 149 (*)    Creatinine, Ser 1.15 (*)    GFR, Estimated 47 (*)    All other components within normal limits  CARBAMAZEPINE LEVEL, TOTAL - Abnormal; Notable for the following components:   Carbamazepine Lvl <2.0 (*)    All other components within normal limits  PHENOBARBITAL LEVEL - Abnormal; Notable for the following components:   Phenobarbital <5.0 (*)    All other components within normal limits  VALPROIC ACID LEVEL - Abnormal; Notable for the following components:   Valproic Acid Lvl <10 (*)    All other components within normal limits  CBC - Abnormal; Notable for the following components:   WBC 13.5 (*)    All other components within normal limits  PHENYTOIN LEVEL, TOTAL - Abnormal; Notable for the following components:   Phenytoin Lvl <2.5 (*)    All other components within normal limits  TSH - Abnormal; Notable for the following components:   TSH 4.566 (*)    All other components within normal limits  HEPATIC FUNCTION PANEL - Abnormal; Notable for the following components:   Total Bilirubin 0.2 (*)    All other components within normal limits  LIPASE, BLOOD - Abnormal; Notable for the following components:   Lipase 57 (*)    All other components within normal limits  D-DIMER, QUANTITATIVE - Abnormal; Notable for the  following components:   D-Dimer, Quant 2.39 (*)    All other components within normal limits  CBG MONITORING, ED - Abnormal; Notable for the following components:   Glucose-Capillary 142 (*)    All other components within normal limits  BRAIN NATRIURETIC PEPTIDE  TROPONIN I (HIGH SENSITIVITY)  TROPONIN I (HIGH SENSITIVITY)    EKG EKG Interpretation  Date/Time:  Friday Jan 27 2023 13:28:49 EDT Ventricular Rate:  113 PR Interval:  177 QRS Duration: 104 QT Interval:  342 QTC Calculation: 469 R Axis:   69 Text Interpretation: Sinus tachycardia Biatrial enlargement RSR' in V1 or V2, right VCD or RVH when compared to prior, overall similar appearance. No STEMI Confirmed by Theda Belfast ([id number]) on 01/27/2023 3:25:55 PM  Radiology DG Chest Portable 1 View  Result Date: 01/27/2023 CLINICAL DATA:  hypoxia, seizure vs syncope with emesis and cough now EXAM: PORTABLE CHEST 1 VIEW COMPARISON:  CXR 03/24/22 FINDINGS: No pleural effusion. No pneumothorax. There is hazy opacity at the left lung base could represent atelectasis, infection, or aspiration. Normal cardiac and mediastinal contours. No radiographically apparent displaced rib fractures. Visualized upper abdomen is unremarkable. Degenerative changes of the left glenohumeral and AC joint. IMPRESSION: Hazy opacity at the left lung base could represent atelectasis, infection, or aspiration. Electronically Signed   By: Lorenza Cambridge M.D.   On: 01/27/2023 15:04    Procedures Procedures    CRITICAL CARE Performed by: Canary Brim Izan Miron Total critical care time: 35 minutes Critical care time was exclusive of separately billable procedures and treating other patients. Critical care was necessary to treat or prevent imminent or life-threatening deterioration. Critical care was time spent personally by me on the following activities: development of treatment plan with patient and/or surrogate as well as nursing, discussions with consultants,  evaluation of patient's response to treatment, examination of patient, obtaining history from patient or surrogate, ordering and performing treatments and interventions, ordering and review of laboratory studies, ordering and review of radiographic studies, pulse oximetry and re-evaluation of patient's condition.   Medications Ordered in ED Medications  Ampicillin-Sulbactam (UNASYN) 3 g in sodium chloride 0.9 % 100 mL IVPB (3 g Intravenous New Bag/Given 01/27/23 1637)  ondansetron (ZOFRAN) injection 4 mg (4 mg Intravenous Given 01/27/23 1621)    ED Course/ Medical Decision Making/ A&P                             Medical Decision Making Amount and/or Complexity of Data Reviewed Labs: ordered. Radiology: ordered.  Risk Prescription drug management.    Katherine Obrien is a 82 y.o. female with a past medical history significant for hypertension, diverticulitis, previous seizure disorder, GERD, CKD, and previous skin cancer who presents for loss of consciousness and now hypoxia.  According to EMS report and patient, she had no preceding symptoms and was eating her lunch including soup sandwich and some cake when she suddenly lost consciousness.  It was not witnessed and she reportedly did not fall and hit her head.  She said that she had no preceding symptoms such as palpitations, chest pain, or shortness of breath.  She had no cough before the episode.  She reportedly was found unconscious and was found to have oxygen saturations that were low.  Patient is on 2 L to keep her oxygen saturations in the 90s.  She is having a loud and productive cough and is having shortness of breath.  She is denying chest pain or palpitations.  She denies any nausea, vomiting, constipation, diarrhea, or urinary symptoms initially although she was reportedly covered in emesis.  She is unsure if she had a seizure or just had a syncopal episode but denied any preceding symptoms.  On exam, lungs have a significant  Mehta coarseness bilaterally.  No wheezing or rales.  Chest and abdomen nontender.  Bowel sounds are appreciated.  No focal neurologic deficits initially and patient is resting.  She is however tachycardic tachypneic and warm to the touch.  Oral temperature was normal.  Oxygen saturations are in the 90s now that she is on oxygen.  EKG did not show STEMI.  She reports some chronic knee pains that are unchanged from baseline.  Clinically I am somewhat  concerned about seizure versus syncope.  I spoke to Dr. Amada Jupiter with neurology and given his lack of any preceding symptoms and this unresponsive episode today with history of seizures not on medications, he recommended not loading her with any seizure medicine but did feel that during her admission for hypoxia she needs EEG.  This was ordered.  He would have his team see her when she is admitted to [facility].  We will get chest x-ray, CT head, and a D-dimer was also ordered due to the tachycardia tachypnea sudden onset and shortness of breath with hypoxia.  D-dimer was elevated so a CT PE study will be ordered.  X-ray was obtained before this and showed evidence of pneumonia.  I spoke to pharmacy who recommended and placed antibiotics for her given her allergies and intolerances.  Patient had some more nausea and was given nausea medicine.  She will need admission for further management of presumed aspiration pneumonia in the setting of either seizure versus syncope.  She will need admission to [facility] as they do not have the capability to get EEG over the weekend which neurology reported she needs to obtain.  Care transferred to oncoming team to wait for results of CT head and CT PE study prior to her admission for hypoxia and aspiration pneumonia.  Care transferred in stable condition.         Final Clinical Impression(s) / ED Diagnoses Final diagnoses:  Aspiration pneumonia, unspecified aspiration pneumonia type, unspecified  laterality, unspecified part of lung (HCC)  Hypoxia  Loss of consciousness (HCC)       Clinical Impression: 1. Aspiration pneumonia, unspecified aspiration pneumonia type, unspecified laterality, unspecified part of lung (HCC)   2. Hypoxia   3. Loss of consciousness (HCC)     Disposition: Admit  This note was prepared with assistance of Dragon voice recognition software. Occasional wrong-word or sound-a-like substitutions may have occurred due to the inherent limitations of voice recognition software.     Jousha Schwandt, Canary Brim, MD 01/27/23 [phone]

## 2023-01-27 NOTE — ED Triage Notes (Addendum)
Patient found at lunch unresponsive with by IL staff. Per EMS patient patient post-ictal on arrival O2 sat of 87 on RA. Patient had vomit noted on clothing, possible aspiration. Seizure was not witnessed. Patient has hx of seizure not currently on any medications for seizures. Patient is A&O x 4 during triage. Patient reports she just remembers blacking out after ordering soup. Patient states she has not had any seizure in a long time. Patient O2 87% on RA during triage. Patient placed on 2LPM via [place] O2 improved to 94% patient denies any respiratory complaints

## 2023-01-27 NOTE — ED Notes (Signed)
ED TO INPATIENT HANDOFF REPORT  Name/Age/Gender Katherine Obrien 82 y.o. female  Code Status    Code Status Orders  (From admission, onward)           Start     Ordered   01/27/23 2041  Do not attempt resuscitation (DNR)  Continuous       Question Answer Comment  If patient has no pulse and is not breathing Do Not Attempt Resuscitation   If patient has a pulse and/or is breathing: Medical Treatment Goals LIMITED ADDITIONAL INTERVENTIONS: Use medication/IV fluids and cardiac monitoring as indicated; Do not use intubation or mechanical ventilation (DNI), also provide comfort medications.  Transfer to Progressive/Stepdown as indicated, avoid Intensive Care.   Consent: Discussion documented in EHR or advanced directives reviewed      01/27/23 2041           Code Status History     Date Active Date Inactive Code Status Order ID Comments User Context   03/26/2019 1820 03/27/2019 2029 Full Code [id number]  Jonah Blue, MD Inpatient   03/26/2019 1514 03/26/2019 1819 DNR [id number]  Jonah Blue, MD ED      Advance Directive Documentation    Flowsheet Row Most Recent Value  Type of Advance Directive Healthcare Power of Attorney  Pre-existing out of facility DNR order (yellow form or pink MOST form) --  "MOST" Form in Place? --       Home/SNF/Other Home  Chief Complaint Loss of consciousness (HCC) [R40.20]  Level of Care/Admitting Diagnosis ED Disposition     ED Disposition  Admit   Condition  --   Comment  Hospital Area: [facility], [place] [[id number]]  Level of Care: Telemetry Medical [104]  May place patient in observation at [facility] or [facility] if equivalent level of care is available:: No  Covid Evaluation: Asymptomatic - no recent exposure (last 10 days) testing not required  Diagnosis: Loss of consciousness [facility]) [[id number]]  Admitting Physician: Anselm Jungling [[id number]]  Attending Physician: Anselm Jungling [[id number]]          Medical  History Past Medical History:  Diagnosis Date   Arthritis    Cancer (HCC)    skin   Cataract 2010   Dr. Luretha Rued, Dr.. Romie Minus @ [facility]   Epilepsy [facility]) 08/11/2015   Hyperlipidemia 12/08/2015   Hypertension    Polyp of colon 08/11/2015    Allergies Allergies  Allergen Reactions   Barbiturates Other (See Comments)    A small amount "overdosed" the patient- "affected my stability and balance" (might have been given at the same as another med?)   Cefdinir Other (See Comments)    A small amount "overdosed" the patient- "affected my stability and balance" (might have been given at the same as another med?)   Phenobarbital Other (See Comments)    A small amount "overdosed" the patient- "affected my stability and balance" (might have been given at the same as another med?)   Tegretol [Carbamazepine] Other (See Comments)    A small amount "overdosed" the patient- "affected my stability and balance" (might have been given at the same as another med?)   Zonisamide Other (See Comments)    Negative reaction    IV Location/Drains/Wounds Patient Lines/Drains/Airways Status     Active Line/Drains/Airways     Name Placement date Placement time Site Days   Peripheral IV 01/27/23 20 G 1.16" Left Antecubital 01/27/23  1353  Antecubital  less than 1  Labs/Imaging Results for orders placed or performed during the hospital encounter of 01/27/23 (from the past 48 hour(s))  CBG monitoring, ED     Status: Abnormal   Collection Time: 01/27/23  1:43 PM  Result Value Ref Range   Glucose-Capillary 142 (H) 70 - 99 mg/dL    Comment: Glucose reference range applies only to samples taken after fasting for at least 8 hours.  Basic metabolic panel - if new onset seizures     Status: Abnormal   Collection Time: 01/27/23  1:53 PM  Result Value Ref Range   Sodium 140 135 - 145 mmol/L   Potassium 3.5 3.5 - 5.1 mmol/L   Chloride 101 98 - 111 mmol/L   CO2 27 22 - 32 mmol/L   Glucose, Bld 149 (H) 70  - 99 mg/dL    Comment: Glucose reference range applies only to samples taken after fasting for at least 8 hours.   BUN 22 8 - 23 mg/dL   Creatinine, Ser 4.09 (H) 0.44 - 1.00 mg/dL   Calcium 9.1 8.9 - 81.1 mg/dL   GFR, Estimated 47 (L) >60 mL/min    Comment: (NOTE) Calculated using the CKD-EPI Creatinine Equation (2021)    Anion gap 12 5 - 15    Comment: Performed at [facility], [address] [place], [place] [address]  Carbamazepine (Tegretol) Level (if patient is taking this medication)     Status: Abnormal   Collection Time: 01/27/23  1:53 PM  Result Value Ref Range   Carbamazepine Lvl <2.0 (L) 4.0 - 12.0 ug/mL    Comment: Performed at [facility], [address], [place], [place] [address]  Phenobarbital Level (if patient is taking this medication)     Status: Abnormal   Collection Time: 01/27/23  1:53 PM  Result Value Ref Range   Phenobarbital <5.0 (L) 15.0 - 40.0 ug/mL    Comment: Performed at [facility], [address] [place], [place] [address]  Valproic Acid (depakote) Level (if patient is taking this medication)     Status: Abnormal   Collection Time: 01/27/23  1:53 PM  Result Value Ref Range   Valproic Acid Lvl <10 (L) 50.0 - 100.0 ug/mL    Comment: RESULT CONFIRMED BY MANUAL DILUTION Performed at [facility], [address] [place], [place] [address]   CBC - if new onset seizures     Status: Abnormal   Collection Time: 01/27/23  1:53 PM  Result Value Ref Range   WBC 13.5 (H) 4.0 - 10.5 K/uL   RBC 4.75 3.87 - 5.11 MIL/uL   Hemoglobin 13.4 12.0 - 15.0 g/dL   HCT 96.2 95.2 - 84.1 %   MCV 90.1 80.0 - 100.0 fL   MCH 28.2 26.0 - 34.0 pg   MCHC 31.3 30.0 - 36.0 g/dL   RDW 32.4 40.1 - 02.7 %   Platelets 271 150 - 400 K/uL   nRBC 0.0 0.0 - 0.2 %    Comment: Performed at [facility], [address] [place], [place] [address]  Phenytoin level, total     Status: Abnormal   Collection Time:  01/27/23  1:53 PM  Result Value Ref Range   Phenytoin Lvl <2.5 (L) 10.0 - 20.0 ug/mL    Comment: Performed at [facility], [address] [place], [place] [address]  TSH     Status: Abnormal   Collection Time: 01/27/23  3:10 PM  Result Value Ref  Range   TSH 4.566 (H) 0.350 - 4.500 uIU/mL    Comment: Performed by a 3rd Generation assay with a functional sensitivity of <=0.01 uIU/mL. Performed at [facility], [address], [place], [place] [address]   Hepatic function panel     Status: Abnormal   Collection Time: 01/27/23  3:10 PM  Result Value Ref Range   Total Protein 7.0 6.5 - 8.1 g/dL   Albumin 3.5 3.5 - 5.0 g/dL   AST 22 15 - 41 U/L   ALT 20 0 - 44 U/L   Alkaline Phosphatase 70 38 - 126 U/L   Total Bilirubin 0.2 (L) 0.3 - 1.2 mg/dL   Bilirubin, Direct <6.0 0.0 - 0.2 mg/dL   Indirect Bilirubin NOT CALCULATED 0.3 - 0.9 mg/dL    Comment: Performed at [facility], [address] [place], [place] [address]  Lipase, blood     Status: Abnormal   Collection Time: 01/27/23  3:10 PM  Result Value Ref Range   Lipase 57 (H) 11 - 51 U/L    Comment: Performed at [facility], [address] [place], [place] [address]  Troponin I (High Sensitivity)     Status: None   Collection Time: 01/27/23  3:10 PM  Result Value Ref Range   Troponin I (High Sensitivity) 5 <18 ng/L    Comment: (NOTE) Elevated high sensitivity troponin I (hsTnI) values and significant  changes across serial measurements may suggest ACS but many other  chronic and acute conditions are known to elevate hsTnI results.  Refer to the "Links" section for chest pain algorithms and additional  guidance. Performed at [facility], [address] [place], [place] [address]   D-dimer, quantitative     Status: Abnormal   Collection Time: 01/27/23  3:10 PM  Result Value Ref Range   D-Dimer, Quant 2.39 (H) 0.00 - 0.50 ug/mL-FEU     Comment: (NOTE) At the manufacturer cut-off value of 0.5 g/mL FEU, this assay has a negative predictive value of 95-100%.This assay is intended for use in conjunction with a clinical pretest probability (PTP) assessment model to exclude pulmonary embolism (PE) and deep venous thrombosis (DVT) in outpatients suspected of PE or DVT. Results should be correlated with clinical presentation. Performed at [facility], [address] [place], [place] [address]   Brain natriuretic peptide     Status: None   Collection Time: 01/27/23  3:11 PM  Result Value Ref Range   B Natriuretic Peptide 32.5 0.0 - 100.0 pg/mL    Comment: Performed at [facility], [address], [place], [place] [address]  Troponin I (High Sensitivity)     Status: None   Collection Time: 01/27/23  4:20 PM  Result Value Ref Range   Troponin I (High Sensitivity) 7 <18 ng/L    Comment: (NOTE) Elevated high sensitivity troponin I (hsTnI) values and significant  changes across serial measurements may suggest ACS but many other  chronic and acute conditions are known to elevate hsTnI results.  Refer to the "Links" section for chest pain algorithms and additional  guidance. Performed at [facility], [address] [place], [place] [address]    CT Angio Chest PE W and/or Wo Contrast  Result Date: 01/27/2023 CLINICAL DATA:  Concern for pulmonary embolism. EXAM: CT ANGIOGRAPHY CHEST WITH CONTRAST TECHNIQUE: Multidetector CT imaging of the chest was performed using the standard protocol during bolus administration of intravenous contrast. Multiplanar  CT image reconstructions and MIPs were obtained to evaluate the vascular anatomy. RADIATION DOSE REDUCTION: This exam was performed according to the departmental dose-optimization program which includes automated exposure control, adjustment of the mA and/or kV according to patient size and/or use of iterative  reconstruction technique. CONTRAST:  75mL OMNIPAQUE IOHEXOL 350 MG/ML SOLN COMPARISON:  Chest radiograph dated 01/27/2023. FINDINGS: Evaluation of this exam is limited due to respiratory motion artifact as well as due to streak artifact caused by patient's arms. Cardiovascular: There is no cardiomegaly or pericardial effusion. Coronary vascular calcification predominantly involving the LAD. Mild atherosclerotic calcification of the thoracic aorta. No aneurysmal dilatation or dissection. The origins of the great vessels of the aortic arch appear patent. Evaluation of the pulmonary arteries is limited due to factors above. No definite large or central pulmonary artery embolus identified. Mediastinum/Nodes: Top-normal mediastinal lymph nodes measure up to 1 cm in short axis in the prevascular space. The esophagus is grossly unremarkable. No mediastinal fluid collection. Lungs/Pleura: Bilateral reticulonodular pulmonary opacities, left greater right may represent edema, pneumonia, or combination. Clinical correlation is recommended. No pleural effusion or pneumothorax. The central airways are patent. Upper Abdomen: Indeterminate 4 cm hypodense lesion in the left lobe of the liver. Additional exophytic lesion arising from the left kidney. Further evaluation of the liver and renal lesions with ultrasound on a nonemergent/outpatient basis recommended. Gallstones. Musculoskeletal: Osteopenia with degenerative changes of the spine. No acute osseous pathology. Review of the MIP images confirms the above findings. IMPRESSION: 1. No CT evidence of central pulmonary artery embolus. 2. Bilateral reticulonodular pulmonary opacities, left greater right may represent edema, pneumonia, or combination. 3.  Aortic Atherosclerosis (ICD10-I70.0). Electronically Signed   By: Elgie Collard M.D.   On: 01/27/2023 19:39   CT HEAD WO CONTRAST ( )  Result Date: 01/27/2023 CLINICAL DATA:  Syncope, hypoxia, unresponsive EXAM: CT HEAD  WITHOUT CONTRAST TECHNIQUE: Contiguous axial images were obtained from the base of the skull through the vertex without intravenous contrast. RADIATION DOSE REDUCTION: This exam was performed according to the departmental dose-optimization program which includes automated exposure control, adjustment of the mA and/or kV according to patient size and/or use of iterative reconstruction technique. COMPARISON:  03/24/2022 FINDINGS: Brain: No acute infarct or hemorrhage. Chronic hypodensity right basal ganglia consistent with dilated perivascular space versus previous lacunar infarct, stable. Lateral ventricles and remaining midline structures are unremarkable. No acute extra-axial fluid collections. No mass effect. Vascular: No hyperdense vessel or unexpected calcification. Skull: Normal. Negative for fracture or focal lesion. Sinuses/Orbits: No acute finding. Other: None. IMPRESSION: 1. No acute intracranial process. Electronically Signed   By: Sharlet Salina M.D.   On: 01/27/2023 17:54   DG Chest Portable 1 View  Result Date: 01/27/2023 CLINICAL DATA:  hypoxia, seizure vs syncope with emesis and cough now EXAM: PORTABLE CHEST 1 VIEW COMPARISON:  CXR 03/24/22 FINDINGS: No pleural effusion. No pneumothorax. There is hazy opacity at the left lung base could represent atelectasis, infection, or aspiration. Normal cardiac and mediastinal contours. No radiographically apparent displaced rib fractures. Visualized upper abdomen is unremarkable. Degenerative changes of the left glenohumeral and AC joint. IMPRESSION: Hazy opacity at the left lung base could represent atelectasis, infection, or aspiration. Electronically Signed   By: Lorenza Cambridge M.D.   On: 01/27/2023 15:04    Pending Labs Unresulted Labs (From admission, onward)     Start     Ordered   01/28/23 0500  CBC  Tomorrow morning,   R  01/27/23 2041   01/28/23 0500  Basic metabolic panel  Tomorrow morning,   R        01/27/23 2041             Vitals/Pain Today's Vitals   01/27/23 1753 01/27/23 1851 01/27/23 1945 01/27/23 2000  BP:  (!) 168/75 (!) 167/79 (!) 166/74  Pulse:  (!) 110 (!) 109 (!) 110  Resp:  (!) 30 (!) 23 18  Temp: 100.3 F (37.9 C)     TempSrc: Oral     SpO2:  95% 97% 96%  Weight:      Height:      PainSc:        Isolation Precautions No active isolations  Medications Medications  enoxaparin (LOVENOX) injection 40 mg (has no administration in time range)  acetaminophen (TYLENOL) tablet 650 mg (has no administration in time range)  ondansetron (ZOFRAN) injection 4 mg (has no administration in time range)  labetalol (NORMODYNE) injection 5 mg (has no administration in time range)  Ampicillin-Sulbactam (UNASYN) 3 g in sodium chloride 0.9 % 100 mL IVPB (has no administration in time range)  Ampicillin-Sulbactam (UNASYN) 3 g in sodium chloride 0.9 % 100 mL IVPB (0 g Intravenous Stopped 01/27/23 1834)  ondansetron (ZOFRAN) injection 4 mg (4 mg Intravenous Given 01/27/23 1621)  iohexol (OMNIPAQUE) 350 MG/ML injection 75 mL (75 mLs Intravenous Contrast Given 01/27/23 1819)  sodium chloride 0.9 % bolus 1,000 mL (1,000 mLs Intravenous New Bag/Given 01/27/23 2040)    Mobility walks

## 2023-01-27 NOTE — Assessment & Plan Note (Signed)
-  Indeterminate 4 cm hypodense lesion in the left lobe of the liver. Additional exophytic lesion arising from the left kidney. Further evaluation of the liver and renal lesions with ultrasound on a nonemergent/outpatient basis recommended.

## 2023-01-27 NOTE — Assessment & Plan Note (Signed)
-   Elevated.  Patient not on antihypertensives at home. -PRN labetalol with parameters

## 2023-01-27 NOTE — ED Provider Notes (Signed)
CT negative for acute findings and CTA negative for PE.  On reevaluation patient is still on nasal cannula oxygen but appears to be breathing comfortably.  Discussed the findings with her and need for admission.   Gwyneth Sprout, MD 01/27/23 [phone]

## 2023-01-27 NOTE — H&P (Signed)
History and Physical    Patient: Katherine Obrien ZOX:[id number] DOB: 02/21/1941 DOA: 01/27/2023 DOS: the patient was seen and examined on 01/27/2023 PCP: Mast, Man X, NP  Patient coming from: ALF/ILF  Chief Complaint:  Chief Complaint  Patient presents with   Seizures   HPI: Katherine Obrien is a 82 y.o. female with medical history significant of seizure disorder not on antiepileptics, HTN, CKD3 who [facility] from [facility] after an unwitness event where she was found to be unconscious.   Pt only remembers eating soup and cake and then woke up in the Ambulance. No witnessed seizure. Per EMS, pt appeared post-ictal and had emesis on her clothing. She reports being otherwise in her normal state of health.  No dizziness, lightheadedness, chest tightness shortness of breath.  No loss of bowel or bladder control.  She had nausea and vomiting only after she arrived here in the ED.   She has hx of seizure and appears to have trialed multiple antiepileptic in the past including Keppra, phenobarbital, carbamazepine, Topamax and Zonegran but all appears to have caused increase drowsiness. Last seizure activity on records appears to be around 08/2022.   In the ED, she was noted to have hypoxia down to 87% on room air with frequent coughing.  Later also developed a temperature of 100.3 Fahrenheit.  Tachycardic with heart rate of 110.  BP up to 198/93.  Leukocytosis of 13.5. Chest x-ray had hazy opacity at the left lung base concerning for aspiration pneumonia.  D-dimer was elevated and CTA chest was obtained which is negative for pulmonary embolism but again confirms possible aspiration pneumonia.  CT head negative.  ED physician discussed with neurologist Dr. Amada Jupiter who does not recommend antiepileptic loading at this time but would like her to be transferred to [facility] for EEG testing as [facility] does not have this capability over the weekend.  She  was started on IV Unasyn for aspiration pneumonia.  Hospitalist consulted for further management.  Review of Systems: As mentioned in the history of present illness. All other systems reviewed and are negative. Past Medical History:  Diagnosis Date   Arthritis    Cancer [facility])    skin   Cataract 2010   Dr. Luretha Rued, Dr.. Romie Minus @ [facility]   Epilepsy [facility]) 08/11/2015   Hyperlipidemia 12/08/2015   Hypertension    Polyp of colon 08/11/2015   Past Surgical History:  Procedure Laterality Date   ABDOMINAL HYSTERECTOMY  1970   APPENDECTOMY  1958   BREAST SURGERY  1984   COLON SURGERY  09/12/2006   FEMUR SURGERY  2018   Broken   TONSILLECTOMY  09/12/1944   Social History:  reports that she has never smoked. She has never used smokeless tobacco. She reports that she does not drink alcohol and does not use drugs.  Allergies  Allergen Reactions   Barbiturates Other (See Comments)    A small amount "overdosed" the patient- "affected my stability and balance" (might have been given at the same as another med?)   Cefdinir Other (See Comments)    A small amount "overdosed" the patient- "affected my stability and balance" (might have been given at the same as another med?)   Phenobarbital Other (See Comments)    A small amount "overdosed" the patient- "affected my stability and balance" (might have been given at the same as another med?)   Tegretol [Carbamazepine] Other (See Comments)    A small amount "overdosed" the patient- "affected  my stability and balance" (might have been given at the same as another med?)   Zonisamide Other (See Comments)    Negative reaction    Family History  Problem Relation Age of Onset   Osteoporosis Mother    Dementia Mother    Arthritis Mother    Stroke Father 8       mini   Dementia Father    CVA Father    Breast cancer Sister    Cancer Sister        breast   Dementia Sister        severe   Alzheimer's disease Sister    Diabetes Sister    Hypertension  Sister    Stroke Paternal Grandfather        1970    Prior to Admission medications   Medication Sig Start Date End Date Taking? Authorizing Provider  Cholecalciferol (VITAMIN D-3) 25 MCG (1000 UT) CAPS TAKE 1 CAPSULE DAILY WITH BREAKFAST. 09/24/19   Mast, Man X, NP  ketoconazole (NIZORAL) 2 % shampoo Apply 1 application topically once a week.  04/23/19   [provider]  Multiple Vitamins-Minerals (ONE-A-DAY WOMENS 50 PLUS) TABS Take 1 tablet by mouth daily with breakfast.    [provider]  Multiple Vitamins-Minerals (PRESERVISION/LUTEIN) CAPS Take 2 capsules by mouth daily with breakfast.    [provider]  Polyethyl Glycol-Propyl Glycol (SYSTANE) 0.4-0.3 % SOLN Place 1-2 drops into both eyes 3 (three) times daily as needed (for dryness).     [provider]    Physical Exam: Vitals:   01/27/23 1753 01/27/23 1851 01/27/23 1945 01/27/23 2000  BP:  (!) 168/75 (!) 167/79 (!) 166/74  Pulse:  (!) 110 (!) 109 (!) 110  Resp:  (!) 30 (!) 23 18  Temp: 100.3 F (37.9 C)     TempSrc: Oral     SpO2:  95% 97% 96%  Weight:      Height:       Constitutional: NAD, calm, comfortable, elderly female appearing and stated age laying flat in bed Eyes: lids and conjunctivae normal ENMT: Mucous membranes are moist.  Neck: normal, supple Respiratory: clear to auscultation bilaterally, no wheezing, no crackles. Normal respiratory effort. No accessory muscle use.  On 2 L via nasal cannula Cardiovascular: Regular rate and rhythm, no murmurs / rubs / gallops. No extremity edema.  Abdomen: Soft, nontender nondistended.   Musculoskeletal: no clubbing / cyanosis. No joint deformity upper and lower extremities. Good ROM, no contractures. Normal muscle tone.  Skin: no rashes, lesions, ulcers. No induration Neurologic: CN 2-12 grossly intact. 4/5 strength of lower extremity-ambulates with walker at baseline.  No focal neurological deficits. Psychiatric: Normal judgment and  insight. Alert and oriented x 3. Normal mood. Data Reviewed:  See HPI  Assessment and Plan: * Loss of consciousness (HCC) -without any prodrome -has hx of seizures not on antiepileptics with this episode concerning for seizures.  Neurology has been consulted and recommends transfer to [facility] for EEG as [facility] does not have this capability over the weekend.  Patient is reluctant to be transferred or undergo testing but has agreed.  She believes that she would not agree to being placed on any antiepileptics as they cause sedation in the past.Keppra, phenobarbital, carbamazepine, Topamax and Zonegran appears to have been trialed in the past based on documentation. -placed on seizure precaution -Keep on continuous telemetry  HTN (hypertension) - Elevated.  Patient not on antihypertensives at home. -PRN labetalol with parameters  Acute hypoxic respiratory failure (HCC) -Secondary to aspiration pneumonia likely following her episode of loss of consciousness.  Opacity seen on both chest x-ray and CTA chest imaging. -Continue IV Unasyn -Requiring 2 L via nasal cannula on presentation.  Continue to wean with goal of oxygen saturation greater than 92%.  Abnormal TSH -Mildly elevated TSH at 4.566. Recommend follow up outpatient  AKI (acute kidney injury) (HCC) -creatinine elevated at 1.15 from prior of 0.93.  -give bolus fluid and follow trend tomorrow  Abnormal finding on CT scan -Indeterminate 4 cm hypodense lesion in the left lobe of the liver. Additional exophytic lesion arising from the left kidney. Further evaluation of the liver and renal lesions with ultrasound on a nonemergent/outpatient basis recommended.       Advance Care Planning:   Code Status: DNR   Consults: neurology  Family Communication: none at bedside  Severity of Illness: The appropriate patient status for this patient is OBSERVATION. Observation status is judged to be reasonable and necessary in order to  provide the required intensity of service to ensure the patient's safety. The patient's presenting symptoms, physical exam findings, and initial radiographic and laboratory data in the context of their medical condition is felt to place them at decreased risk for further clinical deterioration. Furthermore, it is anticipated that the patient will be medically stable for discharge from the hospital within 2 midnights of admission.   Author: Anselm Jungling, DO 01/27/2023 8:50 PM  For on call review [email or web address].

## 2023-01-27 NOTE — Assessment & Plan Note (Addendum)
-  without any prodrome -has hx of seizures not on antiepileptics with this episode concerning for seizures.  Neurology has been consulted and recommends transfer to [facility] for EEG as [facility] not have this capability over the weekend.  Patient is reluctant to be transferred or undergo testing but has agreed.  She believes that she would not agree to being placed on any antiepileptics as they cause sedation in the past.Keppra, phenobarbital, carbamazepine, Topamax and Zonegran appears to have been trialed in the past based on documentation. -placed on seizure precaution -Keep on continuous telemetry

## 2023-01-27 NOTE — ED Notes (Signed)
Patient's daughter, Rayfield Citizen, updated.

## 2023-01-27 NOTE — Assessment & Plan Note (Signed)
-  Mildly elevated TSH at 4.566. Recommend follow up outpatient

## 2023-01-27 NOTE — Assessment & Plan Note (Addendum)
-  creatinine elevated at 1.15 from prior of 0.93.  -give bolus fluid and follow trend tomorrow

## 2023-01-27 NOTE — Assessment & Plan Note (Addendum)
-  Secondary to aspiration pneumonia likely following her episode of loss of consciousness.  Opacity seen on both chest x-ray and CTA chest imaging. -Continue IV Unasyn -Requiring 2 L via nasal cannula on presentation.  Continue to wean with goal of oxygen saturation greater than 92%.

## 2023-01-28 ENCOUNTER — Observation Stay (HOSPITAL_COMMUNITY): Payer: Medicare Other

## 2023-01-28 DIAGNOSIS — Z823 Family history of stroke: Secondary | ICD-10-CM | POA: Diagnosis not present

## 2023-01-28 DIAGNOSIS — K769 Liver disease, unspecified: Secondary | ICD-10-CM | POA: Diagnosis present

## 2023-01-28 DIAGNOSIS — R0902 Hypoxemia: Secondary | ICD-10-CM | POA: Diagnosis not present

## 2023-01-28 DIAGNOSIS — R569 Unspecified convulsions: Secondary | ICD-10-CM

## 2023-01-28 DIAGNOSIS — J690 Pneumonitis due to inhalation of food and vomit: Secondary | ICD-10-CM | POA: Diagnosis present

## 2023-01-28 DIAGNOSIS — Z86010 Personal history of colonic polyps: Secondary | ICD-10-CM | POA: Diagnosis not present

## 2023-01-28 DIAGNOSIS — R413 Other amnesia: Secondary | ICD-10-CM | POA: Diagnosis present

## 2023-01-28 DIAGNOSIS — J9601 Acute respiratory failure with hypoxia: Secondary | ICD-10-CM | POA: Diagnosis present

## 2023-01-28 DIAGNOSIS — R7989 Other specified abnormal findings of blood chemistry: Secondary | ICD-10-CM | POA: Diagnosis not present

## 2023-01-28 DIAGNOSIS — Z8249 Family history of ischemic heart disease and other diseases of the circulatory system: Secondary | ICD-10-CM | POA: Diagnosis not present

## 2023-01-28 DIAGNOSIS — Z820 Family history of epilepsy and other diseases of the nervous system: Secondary | ICD-10-CM | POA: Diagnosis not present

## 2023-01-28 DIAGNOSIS — R6520 Severe sepsis without septic shock: Secondary | ICD-10-CM | POA: Diagnosis present

## 2023-01-28 DIAGNOSIS — G40909 Epilepsy, unspecified, not intractable, without status epilepticus: Secondary | ICD-10-CM | POA: Diagnosis present

## 2023-01-28 DIAGNOSIS — Z8262 Family history of osteoporosis: Secondary | ICD-10-CM | POA: Diagnosis not present

## 2023-01-28 DIAGNOSIS — R4020 Unspecified coma: Secondary | ICD-10-CM | POA: Diagnosis not present

## 2023-01-28 DIAGNOSIS — I10 Essential (primary) hypertension: Secondary | ICD-10-CM | POA: Diagnosis not present

## 2023-01-28 DIAGNOSIS — R9389 Abnormal findings on diagnostic imaging of other specified body structures: Secondary | ICD-10-CM | POA: Diagnosis not present

## 2023-01-28 DIAGNOSIS — E785 Hyperlipidemia, unspecified: Secondary | ICD-10-CM | POA: Diagnosis present

## 2023-01-28 DIAGNOSIS — Z803 Family history of malignant neoplasm of breast: Secondary | ICD-10-CM | POA: Diagnosis not present

## 2023-01-28 DIAGNOSIS — E876 Hypokalemia: Secondary | ICD-10-CM | POA: Diagnosis present

## 2023-01-28 DIAGNOSIS — N179 Acute kidney failure, unspecified: Secondary | ICD-10-CM | POA: Diagnosis present

## 2023-01-28 DIAGNOSIS — I129 Hypertensive chronic kidney disease with stage 1 through stage 4 chronic kidney disease, or unspecified chronic kidney disease: Secondary | ICD-10-CM | POA: Diagnosis present

## 2023-01-28 DIAGNOSIS — A419 Sepsis, unspecified organism: Secondary | ICD-10-CM | POA: Diagnosis present

## 2023-01-28 DIAGNOSIS — Z8261 Family history of arthritis: Secondary | ICD-10-CM | POA: Diagnosis not present

## 2023-01-28 DIAGNOSIS — Z79899 Other long term (current) drug therapy: Secondary | ICD-10-CM | POA: Diagnosis not present

## 2023-01-28 DIAGNOSIS — Z85828 Personal history of other malignant neoplasm of skin: Secondary | ICD-10-CM | POA: Diagnosis not present

## 2023-01-28 DIAGNOSIS — N183 Chronic kidney disease, stage 3 unspecified: Secondary | ICD-10-CM | POA: Diagnosis present

## 2023-01-28 DIAGNOSIS — R55 Syncope and collapse: Secondary | ICD-10-CM | POA: Diagnosis present

## 2023-01-28 DIAGNOSIS — Z833 Family history of diabetes mellitus: Secondary | ICD-10-CM | POA: Diagnosis not present

## 2023-01-28 DIAGNOSIS — Z66 Do not resuscitate: Secondary | ICD-10-CM | POA: Diagnosis present

## 2023-01-28 LAB — BASIC METABOLIC PANEL
Anion gap: 11 (ref 5–15)
BUN: 19 mg/dL (ref 8–23)
CO2: 25 mmol/L (ref 22–32)
Calcium: 8.4 mg/dL — ABNORMAL LOW (ref 8.9–10.3)
Chloride: 102 mmol/L (ref 98–111)
Creatinine, Ser: 1.18 mg/dL — ABNORMAL HIGH (ref 0.44–1.00)
GFR, Estimated: 46 mL/min — ABNORMAL LOW (ref >60)
Glucose, Bld: 118 mg/dL — ABNORMAL HIGH (ref 70–99)
Potassium: 4.1 mmol/L (ref 3.5–5.1)
Sodium: 138 mmol/L (ref 135–145)

## 2023-01-28 LAB — CBC
HCT: 39.8 % (ref 36.0–46.0)
Hemoglobin: 12.7 g/dL (ref 12.0–15.0)
MCH: 28.2 pg (ref 26.0–34.0)
MCHC: 31.9 g/dL (ref 30.0–36.0)
MCV: 88.4 fL (ref 80.0–100.0)
Platelets: 236 10*3/uL (ref 150–400)
RBC: 4.5 MIL/uL (ref 3.87–5.11)
RDW: 15.4 % (ref 11.5–15.5)
WBC: 31.2 10*3/uL — ABNORMAL HIGH (ref 4.0–10.5)
nRBC: 0 % (ref 0.0–0.2)

## 2023-01-28 MED ORDER — SODIUM CHLORIDE 0.9 % IV SOLN: INTRAVENOUS | Status: DC | PRN

## 2023-01-28 NOTE — Progress Notes (Signed)
EEG complete - results pending 

## 2023-01-28 NOTE — Progress Notes (Addendum)
PROGRESS NOTE    Katherine Obrien  YNW:[id number] DOB: 25-Oct-1940 DOA: 01/27/2023 PCP: Mast, Man X, NP    Brief Narrative:  Katherine Obrien is a 82 y.o. female with past medical history of seizure disorder not on medications, hypertension, CKD stage III [facility] after on witnessed event when she was found to be unconscious.  Patient does not recall the event.  No mention of witnessed seizure but appeared postictal with vomiting.  Patient does have history of seizure and had taken Keppra, phenobarbital, carbamazepine, Topamax and Zonegran this had caused her drowsiness.  Reported last seizure activity was 08/2022.  In the ED, patient was noted to be hypoxic with pulse ox of 87% on room air and was febrile at 100.3 F with tachycardia but blood pressure was elevated.  Had leukocytosis and chest x-ray showed left basilar hazy opacity concerning for aspiration pneumonia.  D-dimer was elevated but CTA of the chest was negative for PE but pneumonia was present.  CT head was negative.  Neurology Dr. Amada Jupiter was notified and patient was admitted hospital for EEG and further evaluation by neurology.  Patient has been started on Unasyn for aspiration pneumonia as well.    Assessment and Plan:  * Loss of consciousness (HCC) Could be postictal situation but no witnessed activity of seizure.  History of seizures not on antiepileptics.  Neurology wanting to monitor with EEG.  Patient with an impression about not wanting to be on antiepileptics due to sedation in the past.  As per the patient Keppra, phenobarbital, carbamazepine, Topamax and Zonegran appears to have been trialed in the past.  Continue seizure precautions. Placed on seizure precaution.  EEG was done with without any signs of seizure-like activity but encephalopathic findings.  Neurology has seen the patient at this time and concern for syncope rather than seizures.  Neurology  recommends assessing for possible arrhythmia possible Zio patch on discharge.  No antiepileptics recommended.  Continue telemetry monitor.  Sepsis secondary to aspiration pneumonia.  Patient had signs of sepsis including chest x-ray/ CT scan with infiltrate, hypoxia with fever and tachycardia leukocytosis on presentation.  Initial CBC showed WBC at 13.5.  WBC has trended up to 31.2 today.  Continue with Unasyn.  Closely trend CBC.  HTN (hypertension) Elevated blood pressure on presentation..  Not on antihypertensives at home.  Continue as needed labetalol.  Acute hypoxic respiratory failure (HCC) Likely aspiration pneumonia.  Opacities seen on CT and chest x-ray.  Requiring 2 L of oxygen by nasal cannula at this time.  Continue IV Unasyn.  Wean oxygen as able.  Abnormal TSH -Mildly elevated TSH at 4.566. Recommend follow up outpatient   AKI (acute kidney injury) -creatinine elevated at 1.15 from prior of 0.93.  Received IV fluids.  Creatinine today at 1.1.  Will continue to monitor BMP.   Abnormal finding on CT scan -Indeterminate 4 cm hypodense lesion in the left lobe of the liver. Additional exophytic lesion arising from the left kidney. Further evaluation of the liver and renal lesions with ultrasound on a nonemergent/outpatient basis recommended.        DVT prophylaxis: enoxaparin (LOVENOX) injection 40 mg Start: 01/27/23 2200   Code Status:     Code Status: DNR  Disposition: Home likely in 1 to 2 days  Status is: Observation  The patient will require care spanning > 2 midnights and should be moved to inpatient because: Aspiration pneumonia with sepsis, possible syncope,, IV antibiotic, concern  for seizure   Family Communication: None at present  Consultants:  Neurology  Procedures:  EEG  Antimicrobials:  Unasyn IV  Anti-infectives (From admission, onward)    Start     Dose/Rate Route Frequency Ordered Stop   02-03-2023 0000  Ampicillin-Sulbactam (UNASYN) 3 g in sodium  chloride 0.9 % 100 mL IVPB        3 g 200 mL/hr over 30 Minutes Intravenous Every 6 hours 01/27/23 2053     01/27/23 1615  Ampicillin-Sulbactam (UNASYN) 3 g in sodium chloride 0.9 % 100 mL IVPB        3 g 200 mL/hr over 30 Minutes Intravenous  Once 01/27/23 1609 01/27/23 1834      Subjective: Today, patient was seen and examined at bedside.  Patient complains of cough.  Has been eating okay.  Denies overt pain.  Denies any fever chills or rigors today.  No mention of further seizure-like activity.  Noted to have elevated white blood cell count.  Objective: Vitals:   01/27/23 2230 01/27/23 2315 Feb 03, 2023 0402 02-03-2023 0813  BP: (!) 120/90 (!) 121/54 133/61 130/61  Pulse: 90 (!) 104 97 89  Resp: 16 18 17 15   Temp:  99.7 F (37.6 C) 98.5 F (36.9 C) 98.3 F (36.8 C)  TempSrc:   Oral Oral  SpO2: 94% 95% 96% 96%  Weight:      Height:        Intake/Output Summary (Last 24 hours) at 2023-02-03 1017 Last data filed at 01/27/2023 2236 Gross per 24 hour  Intake 1000 ml  Output --  Net 1000 ml   Filed Weights   01/27/23 1337  Weight: 90.7 kg    Physical Examination: Body mass index is 35.43 kg/m.   General: Obese built, not in obvious distress, on nasal cannula oxygen, elderly female, HENT:   No scleral pallor or icterus noted. Oral mucosa is moist.  Chest:  Clear breath sounds.  Coarse breath sounds noted. CVS: S1 &S2 heard. No murmur.  Regular rate and rhythm. Abdomen: Soft, nontender, nondistended.  Bowel sounds are heard.   Extremities: No cyanosis, clubbing or edema.  Peripheral pulses are palpable. Psych: Alert, awake and oriented, normal mood CNS:  No cranial nerve deficits.  Power equal in all extremities.   Skin: Warm and dry.  No rashes noted.  Data Reviewed:   CBC: Recent Labs  Lab 01/27/23 1353 2023-02-03 0043  WBC 13.5* 31.2*  HGB 13.4 12.7  HCT 42.8 39.8  MCV 90.1 88.4  PLT 271 236    Basic Metabolic Panel: Recent Labs  Lab 01/27/23 1353  2023/02/03 0043  NA 140 138  K 3.5 4.1  CL 101 102  CO2 27 25  GLUCOSE 149* 118*  BUN 22 19  CREATININE 1.15* 1.18*  CALCIUM 9.1 8.4*    Liver Function Tests: Recent Labs  Lab 01/27/23 1510  AST 22  ALT 20  ALKPHOS 70  BILITOT 0.2*  PROT 7.0  ALBUMIN 3.5     Radiology Studies: EEG adult  Result Date: 2023-02-03 Charlsie Quest, MD     02/03/2023  7:21 AM Patient Name: Katherine Obrien MRN: [id number] Epilepsy Attending: Charlsie Quest Referring Physician/Provider: Tegeler, Canary Brim, MD Date: Feb 03, 2023 Duration: 23.30 mins Patient history:  82 y.o. female with medical history significant of seizure disorder not on antiepileptics, HTN, CKD3 who [facility] after an unwitness event where she was found to be unconscious. EEG to evaluate for seizure. Level  of alertness: Awake, asleep AEDs during EEG study: None Technical aspects: This EEG study was done with scalp electrodes positioned according to the 10-20 International system of electrode placement. Electrical activity was reviewed with band pass filter of 1-70Hz , sensitivity of 7 uV/mm, display speed of 31mm/sec with a 60Hz  notched filter applied as appropriate. EEG data were recorded continuously and digitally stored.  Video monitoring was available and reviewed as appropriate. Description: The posterior dominant rhythm consists of 8-9 Hz activity of moderate voltage (25-35 uV) seen predominantly in posterior head regions, symmetric and reactive to eye opening and eye closing. Sleep was characterized by vertex waves, sleep spindles (12 to 14 Hz), maximal frontocentral region. EEG showed intermittent generalized 3 to 6 Hz theta-delta slowing. Physiologic photic driving was not seen during photic stimulation. Hyperventilation was not performed.   ABNORMALITY - Intermittent slow, generalized IMPRESSION: This study is suggestive of mild diffuse encephalopathy, nonspecific etiology.  No  seizures or epileptiform discharges were seen throughout the recording. Charlsie Quest   CT Angio Chest PE W and/or Wo Contrast  Result Date: 01/27/2023 CLINICAL DATA:  Concern for pulmonary embolism. EXAM: CT ANGIOGRAPHY CHEST WITH CONTRAST TECHNIQUE: Multidetector CT imaging of the chest was performed using the standard protocol during bolus administration of intravenous contrast. Multiplanar CT image reconstructions and MIPs were obtained to evaluate the vascular anatomy. RADIATION DOSE REDUCTION: This exam was performed according to the departmental dose-optimization program which includes automated exposure control, adjustment of the mA and/or kV according to patient size and/or use of iterative reconstruction technique. CONTRAST:  75mL OMNIPAQUE IOHEXOL 350 MG/ML SOLN COMPARISON:  Chest radiograph dated 01/27/2023. FINDINGS: Evaluation of this exam is limited due to respiratory motion artifact as well as due to streak artifact caused by patient's arms. Cardiovascular: There is no cardiomegaly or pericardial effusion. Coronary vascular calcification predominantly involving the LAD. Mild atherosclerotic calcification of the thoracic aorta. No aneurysmal dilatation or dissection. The origins of the great vessels of the aortic arch appear patent. Evaluation of the pulmonary arteries is limited due to factors above. No definite large or central pulmonary artery embolus identified. Mediastinum/Nodes: Top-normal mediastinal lymph nodes measure up to 1 cm in short axis in the prevascular space. The esophagus is grossly unremarkable. No mediastinal fluid collection. Lungs/Pleura: Bilateral reticulonodular pulmonary opacities, left greater right may represent edema, pneumonia, or combination. Clinical correlation is recommended. No pleural effusion or pneumothorax. The central airways are patent. Upper Abdomen: Indeterminate 4 cm hypodense lesion in the left lobe of the liver. Additional exophytic lesion arising  from the left kidney. Further evaluation of the liver and renal lesions with ultrasound on a nonemergent/outpatient basis recommended. Gallstones. Musculoskeletal: Osteopenia with degenerative changes of the spine. No acute osseous pathology. Review of the MIP images confirms the above findings. IMPRESSION: 1. No CT evidence of central pulmonary artery embolus. 2. Bilateral reticulonodular pulmonary opacities, left greater right may represent edema, pneumonia, or combination. 3.  Aortic Atherosclerosis (ICD10-I70.0). Electronically Signed   By: Elgie Collard M.D.   On: 01/27/2023 19:39   CT HEAD WO CONTRAST ( )  Result Date: 01/27/2023 CLINICAL DATA:  Syncope, hypoxia, unresponsive EXAM: CT HEAD WITHOUT CONTRAST TECHNIQUE: Contiguous axial images were obtained from the base of the skull through the vertex without intravenous contrast. RADIATION DOSE REDUCTION: This exam was performed according to the departmental dose-optimization program which includes automated exposure control, adjustment of the mA and/or kV according to patient size and/or use of iterative reconstruction technique. COMPARISON:  03/24/2022 FINDINGS: Brain: No acute  infarct or hemorrhage. Chronic hypodensity right basal ganglia consistent with dilated perivascular space versus previous lacunar infarct, stable. Lateral ventricles and remaining midline structures are unremarkable. No acute extra-axial fluid collections. No mass effect. Vascular: No hyperdense vessel or unexpected calcification. Skull: Normal. Negative for fracture or focal lesion. Sinuses/Orbits: No acute finding. Other: None. IMPRESSION: 1. No acute intracranial process. Electronically Signed   By: Sharlet Salina M.D.   On: 01/27/2023 17:54   DG Chest Portable 1 View  Result Date: 01/27/2023 CLINICAL DATA:  hypoxia, seizure vs syncope with emesis and cough now EXAM: PORTABLE CHEST 1 VIEW COMPARISON:  CXR 03/24/22 FINDINGS: No pleural effusion. No pneumothorax. There is  hazy opacity at the left lung base could represent atelectasis, infection, or aspiration. Normal cardiac and mediastinal contours. No radiographically apparent displaced rib fractures. Visualized upper abdomen is unremarkable. Degenerative changes of the left glenohumeral and AC joint. IMPRESSION: Hazy opacity at the left lung base could represent atelectasis, infection, or aspiration. Electronically Signed   By: Lorenza Cambridge M.D.   On: 01/27/2023 15:04      LOS: 0 days    Joycelyn Das, MD [facility] Available via Epic secure chat 7am-7pm After these hours, please refer to coverage provider listed on [email or web address] 01/28/2023, 10:17 AM

## 2023-01-28 NOTE — Consult Note (Addendum)
NEURO HOSPITALIST CONSULT NOTE   Requestig physician: Dr. Tyson Babinski  Reason for Consult: Seizure versus syncope  History obtained from:  Patient and Chart     HPI:                                                                                                                                          Katherine Obrien is an 82 y.o. female with a PMHx of skin cancer, CKD, remote history of epilepsy without recurrence since menopause many years ago (not on any seizure medications), HLD, HTN and colon polyp who presented to the hospital yesterday after being found unresponsive at lunch at her independent living facility. EMS felt that the patient was postictal. There was vomit on her clothing with possible aspiration and her O2 sat on RA was 87. No seizure activity had been witnessed. She was alert and oriented x 4 on arrival to Triage. She stated that she just remembers blacking out after ordering soup. There were no prodromal symptoms, including no SOB, palpitations, cough or CP. There is no documentation of any urinary or bowel incontinence.   Past Medical History:  Diagnosis Date   Arthritis    Cancer [facility])    skin   Cataract 2010   Dr. Luretha Rued, Dr.. Romie Minus @ [facility]   Epilepsy [facility]) 08/11/2015   Hyperlipidemia 12/08/2015   Hypertension    Polyp of colon 08/11/2015    Past Surgical History:  Procedure Laterality Date   ABDOMINAL HYSTERECTOMY  1970   APPENDECTOMY  1958   BREAST SURGERY  1984   COLON SURGERY  09/12/2006   FEMUR SURGERY  2018   Broken   TONSILLECTOMY  09/12/1944    Family History  Problem Relation Age of Onset   Osteoporosis Mother    Dementia Mother    Arthritis Mother    Stroke Father 70       mini   Dementia Father    CVA Father    Breast cancer Sister    Cancer Sister        breast   Dementia Sister        severe   Alzheimer's disease Sister    Diabetes Sister    Hypertension Sister    Stroke Paternal Grandfather        1970              Social History:  reports that she has never smoked. She has never used smokeless tobacco. She reports that she does not drink alcohol and does not use drugs.  Allergies  Allergen Reactions   Barbiturates Other (See Comments)    A small amount "overdosed" the patient- "affected my stability and balance" (might have been given at the same as another med?)   Cefdinir Other (See  Comments)    A small amount "overdosed" the patient- "affected my stability and balance" (might have been given at the same as another med?)   Phenobarbital Other (See Comments)    A small amount "overdosed" the patient- "affected my stability and balance" (might have been given at the same as another med?)   Tegretol [Carbamazepine] Other (See Comments)    A small amount "overdosed" the patient- "affected my stability and balance" (might have been given at the same as another med?)   Zonisamide Other (See Comments)    Negative reaction    MEDICATIONS:                                                                                                                     Prior to Admission:  Medications Prior to Admission  Medication Sig Dispense Refill Last Dose   Cholecalciferol (VITAMIN D-3) 25 MCG (1000 UT) CAPS TAKE 1 CAPSULE DAILY WITH BREAKFAST. 90 capsule 0    ketoconazole (NIZORAL) 2 % shampoo Apply 1 application topically once a week.       Multiple Vitamins-Minerals (ONE-A-DAY WOMENS 50 PLUS) TABS Take 1 tablet by mouth daily with breakfast.      Multiple Vitamins-Minerals (PRESERVISION/LUTEIN) CAPS Take 2 capsules by mouth daily with breakfast.      Polyethyl Glycol-Propyl Glycol (SYSTANE) 0.4-0.3 % SOLN Place 1-2 drops into both eyes 3 (three) times daily as needed (for dryness).       Scheduled:  enoxaparin (LOVENOX) injection  40 mg Subcutaneous Q24H   Continuous:  ampicillin-sulbactam (UNASYN) IV 3 g (01/28/23 0651)     ROS:                                                                                                                                        As per HPI.    Blood pressure 130/61, pulse 89, temperature 98.3 F (36.8 C), temperature source Oral, resp. rate 15, height 5\' 3"  (1.6 m), weight 90.7 kg, SpO2 96 %.   General Examination:  Physical Exam  HEENT-  [place]/AT    Lungs- Respirations unlabored Extremities- No edema  Neurological Examination Mental Status: Alert, oriented x 5, thought content appropriate.  Speech fluent without evidence of aphasia.  Able to follow all commands without difficulty. Cranial Nerves: II: Temporal visual fields intact with no extinction to DSS. Pupils are equal.   III,IV, VI: No ptosis. EOMI.  V: Temp sensation equal bilaterally  VII: Smile symmetric VIII: Hearing intact to voice IX,X: No hoarseness XI: Symmetric shoulder shrug XII: Midline tongue extension Motor: BUE 5/5 proximally and distally BLE 4+/5 proximally and distally  No pronator drift.  Sensory: Temp and light touch intact throughout, bilaterally. No extinction to DSS.  Deep Tendon Reflexes: 1+ and symmetric throughout Cerebellar: No ataxia with FNF bilaterally  Gait: Deferred   Lab Results: Basic Metabolic Panel: Recent Labs  Lab 01/27/23 1353 01/28/23 0043  NA 140 138  K 3.5 4.1  CL 101 102  CO2 27 25  GLUCOSE 149* 118*  BUN 22 19  CREATININE 1.15* 1.18*  CALCIUM 9.1 8.4*    CBC: Recent Labs  Lab 01/27/23 1353 01/28/23 0043  WBC 13.5* 31.2*  HGB 13.4 12.7  HCT 42.8 39.8  MCV 90.1 88.4  PLT 271 236    Cardiac Enzymes: No results for input(s): "CKTOTAL", "CKMB", "CKMBINDEX", "TROPONINI" in the last 168 hours.  Lipid Panel: No results for input(s): "CHOL", "TRIG", "HDL", "CHOLHDL", "VLDL", "LDLCALC" in the last 168 hours.  Imaging: EEG adult  Result Date: 01/28/2023 Charlsie Quest, MD     01/28/2023  7:21 AM Patient Name: Katherine Obrien MRN: [id number] Epilepsy Attending: Charlsie Quest Referring Physician/Provider: Tegeler, Canary Brim, MD Date: 01/28/2023 Duration: 23.30 mins Patient history:  82 y.o. female with medical history significant of seizure disorder not on antiepileptics, HTN, CKD3 who presents from [facility] after an unwitness event where she was found to be unconscious. EEG to evaluate for seizure. Level of alertness: Awake, asleep AEDs during EEG study: None Technical aspects: This EEG study was done with scalp electrodes positioned according to the 10-20 International system of electrode placement. Electrical activity was reviewed with band pass filter of 1-70Hz , sensitivity of 7 uV/mm, display speed of 56mm/sec with a 60Hz  notched filter applied as appropriate. EEG data were recorded continuously and digitally stored.  Video monitoring was available and reviewed as appropriate. Description: The posterior dominant rhythm consists of 8-9 Hz activity of moderate voltage (25-35 uV) seen predominantly in posterior head regions, symmetric and reactive to eye opening and eye closing. Sleep was characterized by vertex waves, sleep spindles (12 to 14 Hz), maximal frontocentral region. EEG showed intermittent generalized 3 to 6 Hz theta-delta slowing. Physiologic photic driving was not seen during photic stimulation. Hyperventilation was not performed.   ABNORMALITY - Intermittent slow, generalized IMPRESSION: This study is suggestive of mild diffuse encephalopathy, nonspecific etiology.  No seizures or epileptiform discharges were seen throughout the recording. Charlsie Quest   CT Angio Chest PE W and/or Wo Contrast  Result Date: 01/27/2023 CLINICAL DATA:  Concern for pulmonary embolism. EXAM: CT ANGIOGRAPHY CHEST WITH CONTRAST TECHNIQUE: Multidetector CT imaging of the chest was performed using the standard protocol during bolus administration of intravenous contrast. Multiplanar CT image  reconstructions and MIPs were obtained to evaluate the vascular anatomy. RADIATION DOSE REDUCTION: This exam was performed according to the departmental dose-optimization program which includes automated exposure control, adjustment of the mA and/or kV according to patient size and/or use of  iterative reconstruction technique. CONTRAST:  75mL OMNIPAQUE IOHEXOL 350 MG/ML SOLN COMPARISON:  Chest radiograph dated 01/27/2023. FINDINGS: Evaluation of this exam is limited due to respiratory motion artifact as well as due to streak artifact caused by patient's arms. Cardiovascular: There is no cardiomegaly or pericardial effusion. Coronary vascular calcification predominantly involving the LAD. Mild atherosclerotic calcification of the thoracic aorta. No aneurysmal dilatation or dissection. The origins of the great vessels of the aortic arch appear patent. Evaluation of the pulmonary arteries is limited due to factors above. No definite large or central pulmonary artery embolus identified. Mediastinum/Nodes: Top-normal mediastinal lymph nodes measure up to 1 cm in short axis in the prevascular space. The esophagus is grossly unremarkable. No mediastinal fluid collection. Lungs/Pleura: Bilateral reticulonodular pulmonary opacities, left greater right may represent edema, pneumonia, or combination. Clinical correlation is recommended. No pleural effusion or pneumothorax. The central airways are patent. Upper Abdomen: Indeterminate 4 cm hypodense lesion in the left lobe of the liver. Additional exophytic lesion arising from the left kidney. Further evaluation of the liver and renal lesions with ultrasound on a nonemergent/outpatient basis recommended. Gallstones. Musculoskeletal: Osteopenia with degenerative changes of the spine. No acute osseous pathology. Review of the MIP images confirms the above findings. IMPRESSION: 1. No CT evidence of central pulmonary artery embolus. 2. Bilateral reticulonodular pulmonary opacities,  left greater right may represent edema, pneumonia, or combination. 3.  Aortic Atherosclerosis (ICD10-I70.0). Electronically Signed   By: Elgie Collard M.D.   On: 01/27/2023 19:39   CT HEAD WO CONTRAST ( )  Result Date: 01/27/2023 CLINICAL DATA:  Syncope, hypoxia, unresponsive EXAM: CT HEAD WITHOUT CONTRAST TECHNIQUE: Contiguous axial images were obtained from the base of the skull through the vertex without intravenous contrast. RADIATION DOSE REDUCTION: This exam was performed according to the departmental dose-optimization program which includes automated exposure control, adjustment of the mA and/or kV according to patient size and/or use of iterative reconstruction technique. COMPARISON:  03/24/2022 FINDINGS: Brain: No acute infarct or hemorrhage. Chronic hypodensity right basal ganglia consistent with dilated perivascular space versus previous lacunar infarct, stable. Lateral ventricles and remaining midline structures are unremarkable. No acute extra-axial fluid collections. No mass effect. Vascular: No hyperdense vessel or unexpected calcification. Skull: Normal. Negative for fracture or focal lesion. Sinuses/Orbits: No acute finding. Other: None. IMPRESSION: 1. No acute intracranial process. Electronically Signed   By: Sharlet Salina M.D.   On: 01/27/2023 17:54   DG Chest Portable 1 View  Result Date: 01/27/2023 CLINICAL DATA:  hypoxia, seizure vs syncope with emesis and cough now EXAM: PORTABLE CHEST 1 VIEW COMPARISON:  CXR 03/24/22 FINDINGS: No pleural effusion. No pneumothorax. There is hazy opacity at the left lung base could represent atelectasis, infection, or aspiration. Normal cardiac and mediastinal contours. No radiographically apparent displaced rib fractures. Visualized upper abdomen is unremarkable. Degenerative changes of the left glenohumeral and AC joint. IMPRESSION: Hazy opacity at the left lung base could represent atelectasis, infection, or aspiration. Electronically Signed    By: Lorenza Cambridge M.D.   On: 01/27/2023 15:04    Assessment: 82 year old female presenting with seizure versus syncope - Exam is nonfocal - EEG:  Intermittent slow, generalized. This study is suggestive of mild diffuse encephalopathy, nonspecific etiology.  No seizures or epileptiform discharges were seen throughout the recording.  - CT head: No acute intracranial process.  - Overall presentation is most consistent with syncope. The patient does endorse a remote history of seizures that stopped after menopause, without any recurrences over the years since  then. She states that the spell did not feel like a seizure to her. She states that it did feel more like syncope. She feels as though she has not been drinking enough water lately and that she has had similar episodes in the past that she recalls as having been due to syncope.  - WBC elevated at 31.2  Recommendations: - Assess for possible arrhythmia. May need Zio patch at discharge. - Orthostatics.  - Assess for possible infection given elevated WBC.  - May be secondary to aspiration. Currently on Unasyn. - CXR with hazy opacity at the left lung base that could represent atelectasis, infection, or aspiration. - No anticonvulsant is indicated at this time.  - Neurohospitalist service will sign off. Please call if there are additional questions.    Electronically signed: Dr. Caryl Pina 01/28/2023, 9:20 AM

## 2023-01-28 NOTE — Progress Notes (Signed)
  SaO2 on room air at rest = 90% SaO2 on room air while ambulating = 87% SaO2 on 1 liters of O2 while ambulating = 92%

## 2023-01-28 NOTE — Evaluation (Signed)
Physical Therapy Evaluation Patient Details Name: Katherine Obrien MRN: [id number] DOB: 02-Sep-1941 Today's Date: 01/28/2023  History of Present Illness  82 y.o. female presents to [facility] hospital on 01/27/2023 after being found unconscious at [facility]. Chest x-ray concerning for aspiration PNA. EEG negative. PMH includes seizure disorder, HTN, CKDIII.  Clinical Impression  Pt presents to PT with deficits in activity tolerance, cardiopulmonary function, strength, power. Pt is able to transfer and ambulate with use of a RW at this time. Pt does appear to fatigue quickly and currently requires supplemental oxygen to maintain oxygen saturation. Pt will benefit from frequent mobilization in an effort to improve strength and endurance. PT recommends discharge home with HHPT, pt has already been receiving PT services at [facility] prior to this admission.     Recommendations for follow up therapy are one component of a multi-disciplinary discharge planning process, led by the attending physician.  Recommendations may be updated based on patient status, additional functional criteria and insurance authorization.  Follow Up Recommendations       Assistance Recommended at Discharge PRN  Patient can return home with the following  A little help with bathing/dressing/bathroom;Assistance with cooking/housework;Assist for transportation;Help with stairs or ramp for entrance    Equipment Recommendations None recommended by PT  Recommendations for Other Services       Functional Status Assessment Patient has had a recent decline in their functional status and demonstrates the ability to make significant improvements in function in a reasonable and predictable amount of time.     Precautions / Restrictions Precautions Precautions: Fall Precaution Comments: SpO2 Restrictions Weight Bearing Restrictions: No      Mobility  Bed Mobility Overal bed mobility: Needs Assistance Bed Mobility: Supine to Sit      Supine to sit: Supervision, HOB elevated     General bed mobility comments: increased time    Transfers Overall transfer level: Needs assistance Equipment used: Rollator (4 wheels) Transfers: Sit to/from Stand Sit to Stand: Min guard, Supervision           General transfer comment: increased time    Ambulation/Gait Ambulation/Gait assistance: Supervision Gait Distance (Feet): 100 Feet Assistive device: Rollator (4 wheels) Gait Pattern/deviations: Step-through pattern Gait velocity: reduced Gait velocity interpretation: <1.8 ft/sec, indicate of risk for recurrent falls   General Gait Details: slowed step-through gait, 2 brief standing rest breaks  Stairs            Wheelchair Mobility    Modified Rankin (Stroke Patients Only)       Balance Overall balance assessment: Needs assistance Sitting-balance support: No upper extremity supported, Feet supported Sitting balance-Leahy Scale: Good     Standing balance support: Single extremity supported, Reliant on assistive device for balance Standing balance-Leahy Scale: Poor                               Pertinent Vitals/Pain Pain Assessment Pain Assessment: No/denies pain    Home Living Family/patient expects to be discharged to:: Other (Comment)                   Additional Comments: Independent Living at [facility]    Prior Function Prior Level of Function : Needs assist             Mobility Comments: ambulatory with rollator ADLs Comments: receives meals from dining hall, assist for transportation     Hand Dominance  Extremity/Trunk Assessment   Upper Extremity Assessment Upper Extremity Assessment: Generalized weakness    Lower Extremity Assessment Lower Extremity Assessment: Generalized weakness    Cervical / Trunk Assessment Cervical / Trunk Assessment: Kyphotic  Communication   Communication: No difficulties  Cognition Arousal/Alertness:  Awake/alert Behavior During Therapy: WFL for tasks assessed/performed Overall Cognitive Status: Impaired/Different from baseline Area of Impairment: Memory                     Memory: Decreased short-term memory                  General Comments General comments (skin integrity, edema, etc.): pt on 1L [place] upon arrival, desats when ambulating on room air to 89%. PT places pt back on 1L [place] at end of session    Exercises     Assessment/Plan    PT Assessment Patient needs continued PT services  PT Problem List Decreased strength;Decreased activity tolerance;Decreased balance;Decreased mobility;Cardiopulmonary status limiting activity       PT Treatment Interventions DME instruction;Gait training;Functional mobility training;Therapeutic activities;Therapeutic exercise;Balance training;Neuromuscular re-education;Patient/family education    PT Goals (Current goals can be found in the Care Plan section)  Acute Rehab PT Goals Patient Stated Goal: to return to prior level of function PT Goal Formulation: With patient Time For Goal Achievement: 02/11/23 Potential to Achieve Goals: Good    Frequency Min 3X/week     Co-evaluation               AM-PAC PT "6 Clicks" Mobility  Outcome Measure Help needed turning from your back to your side while in a flat bed without using bedrails?: A Little Help needed moving from lying on your back to sitting on the side of a flat bed without using bedrails?: A Little Help needed moving to and from a bed to a chair (including a wheelchair)?: A Little Help needed standing up from a chair using your arms (e.g., wheelchair or bedside chair)?: A Little Help needed to walk in hospital room?: A Little Help needed climbing 3-5 steps with a railing? : A Lot 6 Click Score: 17    End of Session Equipment Utilized During Treatment: Oxygen Activity Tolerance: Patient tolerated treatment well Patient left: in chair;with chair alarm set;with  call bell/phone within reach Nurse Communication: Mobility status PT Visit Diagnosis: Other abnormalities of gait and mobility (R26.89);Muscle weakness (generalized) (M62.81)    Time: 1610-9604 PT Time Calculation (min) (ACUTE ONLY): 39 min   Charges:   PT Evaluation $PT Eval Low Complexity: 1 Low          Arlyss Gandy, PT, DPT Acute Rehabilitation Office [phone]   Arlyss Gandy 01/28/2023, 5:28 PM

## 2023-01-28 NOTE — Procedures (Signed)
Patient Name: Katherine Obrien  MRN: [id number]  Epilepsy Attending: Charlsie Quest  Referring Physician/Provider: Tegeler, Canary Brim, MD  Date: 01/28/2023 Duration: 23.30 mins  Patient history:  82 y.o. female with medical history significant of seizure disorder not on antiepileptics, HTN, CKD3 who presents from [facility] facility after an unwitness event where she was found to be unconscious. EEG to evaluate for seizure.  Level of alertness: Awake, asleep  AEDs during EEG study: None  Technical aspects: This EEG study was done with scalp electrodes positioned according to the 10-20 International system of electrode placement. Electrical activity was reviewed with band pass filter of 1-70Hz , sensitivity of 7 uV/mm, display speed of 18mm/sec with a 60Hz  notched filter applied as appropriate. EEG data were recorded continuously and digitally stored.  Video monitoring was available and reviewed as appropriate.  Description: The posterior dominant rhythm consists of 8-9 Hz activity of moderate voltage (25-35 uV) seen predominantly in posterior head regions, symmetric and reactive to eye opening and eye closing. Sleep was characterized by vertex waves, sleep spindles (12 to 14 Hz), maximal frontocentral region. EEG showed intermittent generalized 3 to 6 Hz theta-delta slowing. Physiologic photic driving was not seen during photic stimulation. Hyperventilation was not performed.     ABNORMALITY - Intermittent slow, generalized  IMPRESSION: This study is suggestive of mild diffuse encephalopathy, nonspecific etiology.  No seizures or epileptiform discharges were seen throughout the recording.  Varshini Arrants Annabelle Harman

## 2023-01-29 DIAGNOSIS — R4020 Unspecified coma: Secondary | ICD-10-CM | POA: Diagnosis not present

## 2023-01-29 DIAGNOSIS — R9389 Abnormal findings on diagnostic imaging of other specified body structures: Secondary | ICD-10-CM | POA: Diagnosis not present

## 2023-01-29 DIAGNOSIS — R7989 Other specified abnormal findings of blood chemistry: Secondary | ICD-10-CM | POA: Diagnosis not present

## 2023-01-29 DIAGNOSIS — J690 Pneumonitis due to inhalation of food and vomit: Secondary | ICD-10-CM | POA: Diagnosis not present

## 2023-01-29 LAB — BASIC METABOLIC PANEL
Anion gap: 10 (ref 5–15)
BUN: 18 mg/dL (ref 8–23)
CO2: 26 mmol/L (ref 22–32)
Calcium: 8.2 mg/dL — ABNORMAL LOW (ref 8.9–10.3)
Chloride: 102 mmol/L (ref 98–111)
Creatinine, Ser: 1.2 mg/dL — ABNORMAL HIGH (ref 0.44–1.00)
GFR, Estimated: 45 mL/min — ABNORMAL LOW (ref >60)
Glucose, Bld: 133 mg/dL — ABNORMAL HIGH (ref 70–99)
Potassium: 3.6 mmol/L (ref 3.5–5.1)
Sodium: 138 mmol/L (ref 135–145)

## 2023-01-29 LAB — MAGNESIUM: Magnesium: 1.9 mg/dL (ref 1.7–2.4)

## 2023-01-29 LAB — CBC
HCT: 37.6 % (ref 36.0–46.0)
Hemoglobin: 11.9 g/dL — ABNORMAL LOW (ref 12.0–15.0)
MCH: 28.4 pg (ref 26.0–34.0)
MCHC: 31.6 g/dL (ref 30.0–36.0)
MCV: 89.7 fL (ref 80.0–100.0)
Platelets: 221 10*3/uL (ref 150–400)
RBC: 4.19 MIL/uL (ref 3.87–5.11)
RDW: 15.5 % (ref 11.5–15.5)
WBC: 23 10*3/uL — ABNORMAL HIGH (ref 4.0–10.5)
nRBC: 0 % (ref 0.0–0.2)

## 2023-01-29 MED ORDER — ONE-A-DAY WOMENS 50 PLUS PO TABS: 1.0000 | ORAL_TABLET | Freq: Every day | ORAL | Status: DC

## 2023-01-29 MED ORDER — POLYETHYL GLYCOL-PROPYL GLYCOL 0.4-0.3 % OP SOLN: 1.0000 [drp] | Freq: Three times a day (TID) | OPHTHALMIC | Status: DC | PRN

## 2023-01-29 MED ORDER — POLYVINYL ALCOHOL 1.4 % OP SOLN: 1.0000 [drp] | Freq: Three times a day (TID) | OPHTHALMIC | Status: DC | PRN

## 2023-01-29 MED ORDER — ADULT MULTIVITAMIN W/MINERALS CH
1.0000 | ORAL_TABLET | Freq: Every day | ORAL | Status: DC
  Administered 2023-01-30 – 2023-01-31 (×2): 1 via ORAL
  Filled 2023-01-29 (×2): qty 1

## 2023-01-29 NOTE — Progress Notes (Signed)
Daughter Rayfield Citizen called to get a update. Daughter asked if Neuro would contact her to explain the results of the patient EEG test. Neuro has been informed of families concerns and was given daughters number to contact.

## 2023-01-29 NOTE — Plan of Care (Signed)
Repeat EEG has been ordered to assess for possible resolution of the slowing seen on the initial EEG.   Approximately 15 minute telephone discussion with patient's daughter this evening Rayfield Citizen, [phone]) describing overall impression and results of the EEG performed yesterday. All questions answered.   Electronically signed: Dr. Caryl Pina

## 2023-01-29 NOTE — Progress Notes (Addendum)
PROGRESS NOTE    Katherine Obrien  ZOX:[id number] DOB: [birth year] DOA: 01/27/2023 PCP: Mast, Man X, NP    Brief Narrative:  Katherine Obrien is a 82 y.o. female with past medical history of seizure disorder not on medications, hypertension, CKD stage III [facility] after on witnessed event when she was found to be unconscious.  Patient does not recall the event.  No mention of witnessed seizure but appeared postictal with vomiting.  Patient does have history of seizure and had taken Keppra, phenobarbital, carbamazepine, Topamax and Zonegran this had caused her drowsiness.  Reported last seizure activity was 08/2022.  In the ED, patient was noted to be hypoxic with pulse ox of 87% on room air and was febrile at 100.3 F with tachycardia but blood pressure was elevated.  Had leukocytosis and chest x-ray showed left basilar hazy opacity concerning for aspiration pneumonia.  D-dimer was elevated but CTA of the chest was negative for PE but pneumonia was present.  CT head was negative.  Neurology Dr. Amada Jupiter was notified and patient was admitted hospital for EEG and further evaluation by neurology.  Patient has been started on Unasyn for aspiration pneumonia as well.    Assessment and Plan:  * Loss of consciousness (HCC) Could be postictal situation but no witnessed activity of seizure.  History of seizures not on antiepileptics.  Neurology wanting to monitor with EEG.  Patient with an impression about not wanting to be on antiepileptics due to sedation in the past.  As per the patient Keppra, phenobarbital, carbamazepine, Topamax and Zonegran appears to have been trialed in the past.  Continue seizure precautions. Placed on seizure precaution.  EEG was done with without any signs of seizure-like activity but encephalopathic findings.  Neurology has seen the patient at this time and concern for syncope rather than seizures.  Neurology  recommends assessing for possible arrhythmia and Zio patch on discharge.  No antiepileptics recommended.  Continue telemetry monitor.  Sepsis secondary to aspiration pneumonia.   Patient had signs of sepsis including chest x-ray/ CT scan with infiltrate, hypoxia with fever and tachycardia leukocytosis on presentation.  Initial CBC showed WBC at 13.5.  WBC has trended up to 31.2 on 01/28/2023 and has slightly trended down to 23 K today.  Continue with Unasyn.  Closely trend CBC.  HTN (hypertension) Elevated blood pressure on presentation. Not on antihypertensives at home.  Continue as needed labetalol.  Latest blood pressure of 124/67.  Acute hypoxic respiratory failure (HCC) Likely aspiration pneumonia.  Opacities seen on CT and chest x-ray.  Requiring 2 L of oxygen by nasal cannula at this time.  Continue IV Unasyn.  Wean oxygen as able.  Abnormal TSH -Mildly elevated TSH at 4.566. Recommend follow up outpatient   AKI (acute kidney injury) -creatinine elevated at 1.15 from prior of 0.93.  Received IV fluids.  Creatinine today at 1.2.  Will continue to monitor BMP.   Abnormal finding on CT scan -Indeterminate 4 cm hypodense lesion in the left lobe of the liver. Additional exophytic lesion arising from the left kidney. Further evaluation of the liver and renal lesions with ultrasound on a nonemergent/outpatient basis recommended.   Deconditioning, debility, weakness.  Patient lives with herself at home.  Physical therapy has recommended home health PT on discharge.       DVT prophylaxis: enoxaparin (LOVENOX) injection 40 mg Start: 01/27/23 2200   Code Status:     Code Status: DNR  Disposition:  Home likely on 01/30/2023 if continues to improve with improved blood counts.  Status is: Inpatient  The patient is  inpatient because: Aspiration pneumonia with sepsis, possible syncope IV antibiotic, concern for seizure significant leukocytosis   Family Communication: None at present.  I tried  to reach the patient's son and daughter on the phone today but was unable to reach them.  Consultants:  Neurology  Procedures:  EEG  Antimicrobials:  Unasyn IV  Anti-infectives (From admission, onward)    Start     Dose/Rate Route Frequency Ordered Stop   February 16, 2023 0000  Ampicillin-Sulbactam (UNASYN) 3 g in sodium chloride 0.9 % 100 mL IVPB        3 g 200 mL/hr over 30 Minutes Intravenous Every 6 hours 01/27/23 2053     01/27/23 1615  Ampicillin-Sulbactam (UNASYN) 3 g in sodium chloride 0.9 % 100 mL IVPB        3 g 200 mL/hr over 30 Minutes Intravenous  Once 01/27/23 1609 01/27/23 1834      Subjective: Today, patient was seen and examined at bedside.  Feels a little better.  Has been having some cough with greenish productive sputum production.  Denies any chest pain or fever.    Objective: Vitals:   02-16-2023 1608 02/16/23 1943 01/29/23 0247 01/29/23 0845  BP: (!) 116/46 138/66 135/73 124/67  Pulse: 87 (!) 102 99 86  Resp: 16 16 19 16   Temp: 99.3 F (37.4 C) 99.1 F (37.3 C) 100.1 F (37.8 C) 98.2 F (36.8 C)  TempSrc: Oral Oral Oral Oral  SpO2: 97% 91% 100% 93%  Weight:      Height:        Intake/Output Summary (Last 24 hours) at 01/29/2023 0935 Last data filed at 01/29/2023 0402 Gross per 24 hour  Intake 882.56 ml  Output --  Net 882.56 ml    Filed Weights   01/27/23 1337  Weight: 90.7 kg    Physical Examination: Body mass index is 35.43 kg/m.   General: Alert awake and Communicative, obese built, on nasal cannula oxygen  HENT:   No scleral pallor or icterus noted. Oral mucosa is moist.  Chest:  .  Coarse breath sounds noted.  Crackles noted over the bases. CVS: S1 &S2 heard. No murmur.  Regular rate and rhythm. Abdomen: Soft, nontender, nondistended.  Bowel sounds are heard.   Extremities: No cyanosis, clubbing or edema.  Peripheral pulses are palpable. Psych: Alert, awake and oriented, normal mood CNS:  No cranial nerve deficits.  Power equal in all  extremities.   Skin: Warm and dry.  No rashes noted.  Data Reviewed:   CBC: Recent Labs  Lab 01/27/23 1353 2023/02/16 0043 01/29/23 0108  WBC 13.5* 31.2* 23.0*  HGB 13.4 12.7 11.9*  HCT 42.8 39.8 37.6  MCV 90.1 88.4 89.7  PLT 271 236 221     Basic Metabolic Panel: Recent Labs  Lab 01/27/23 1353 Feb 16, 2023 0043 01/29/23 0108  NA 140 138 138  K 3.5 4.1 3.6  CL 101 102 102  CO2 27 25 26   GLUCOSE 149* 118* 133*  BUN 22 19 18   CREATININE 1.15* 1.18* 1.20*  CALCIUM 9.1 8.4* 8.2*  MG  --   --  1.9     Liver Function Tests: Recent Labs  Lab 01/27/23 1510  AST 22  ALT 20  ALKPHOS 70  BILITOT 0.2*  PROT 7.0  ALBUMIN 3.5      Radiology Studies: EEG adult  Result Date: 02/16/2023 Lindie Spruce  Val Eagle, MD     01/28/2023  7:21 AM Patient Name: Katherine Obrien Patient MRN: [id number] Epilepsy Attending: Charlsie Quest Referring Physician/Provider: Tegeler, Canary Brim, MD Date: 01/28/2023 Duration: 23.30 mins Patient history:  82 y.o. female with medical history significant of seizure disorder not on antiepileptics, HTN, CKD3 who [facility] after an unwitness event where she was found to be unconscious. EEG to evaluate for seizure. Level of alertness: Awake, asleep AEDs during EEG study: None Technical aspects: This EEG study was done with scalp electrodes positioned according to the 10-20 International system of electrode placement. Electrical activity was reviewed with band pass filter of 1-70Hz , sensitivity of 7 uV/mm, display speed of 44mm/sec with a 60Hz  notched filter applied as appropriate. EEG data were recorded continuously and digitally stored.  Video monitoring was available and reviewed as appropriate. Description: The posterior dominant rhythm consists of 8-9 Hz activity of moderate voltage (25-35 uV) seen predominantly in posterior head regions, symmetric and reactive to eye opening and eye closing. Sleep was characterized by  vertex waves, sleep spindles (12 to 14 Hz), maximal frontocentral region. EEG showed intermittent generalized 3 to 6 Hz theta-delta slowing. Physiologic photic driving was not seen during photic stimulation. Hyperventilation was not performed.   ABNORMALITY - Intermittent slow, generalized IMPRESSION: This study is suggestive of mild diffuse encephalopathy, nonspecific etiology.  No seizures or epileptiform discharges were seen throughout the recording. Charlsie Quest   CT Angio Chest PE W and/or Wo Contrast  Result Date: 01/27/2023 CLINICAL DATA:  Concern for pulmonary embolism. EXAM: CT ANGIOGRAPHY CHEST WITH CONTRAST TECHNIQUE: Multidetector CT imaging of the chest was performed using the standard protocol during bolus administration of intravenous contrast. Multiplanar CT image reconstructions and MIPs were obtained to evaluate the vascular anatomy. RADIATION DOSE REDUCTION: This exam was performed according to the departmental dose-optimization program which includes automated exposure control, adjustment of the mA and/or kV according to patient size and/or use of iterative reconstruction technique. CONTRAST:  75mL OMNIPAQUE IOHEXOL 350 MG/ML SOLN COMPARISON:  Chest radiograph dated 01/27/2023. FINDINGS: Evaluation of this exam is limited due to respiratory motion artifact as well as due to streak artifact caused by patient's arms. Cardiovascular: There is no cardiomegaly or pericardial effusion. Coronary vascular calcification predominantly involving the LAD. Mild atherosclerotic calcification of the thoracic aorta. No aneurysmal dilatation or dissection. The origins of the great vessels of the aortic arch appear patent. Evaluation of the pulmonary arteries is limited due to factors above. No definite large or central pulmonary artery embolus identified. Mediastinum/Nodes: Top-normal mediastinal lymph nodes measure up to 1 cm in short axis in the prevascular space. The esophagus is grossly unremarkable.  No mediastinal fluid collection. Lungs/Pleura: Bilateral reticulonodular pulmonary opacities, left greater right may represent edema, pneumonia, or combination. Clinical correlation is recommended. No pleural effusion or pneumothorax. The central airways are patent. Upper Abdomen: Indeterminate 4 cm hypodense lesion in the left lobe of the liver. Additional exophytic lesion arising from the left kidney. Further evaluation of the liver and renal lesions with ultrasound on a nonemergent/outpatient basis recommended. Gallstones. Musculoskeletal: Osteopenia with degenerative changes of the spine. No acute osseous pathology. Review of the MIP images confirms the above findings. IMPRESSION: 1. No CT evidence of central pulmonary artery embolus. 2. Bilateral reticulonodular pulmonary opacities, left greater right may represent edema, pneumonia, or combination. 3.  Aortic Atherosclerosis (ICD10-I70.0). Electronically Signed   By: Elgie Collard M.D.   On: 01/27/2023 19:39   CT HEAD WO  CONTRAST ( )  Result Date: 01/27/2023 CLINICAL DATA:  Syncope, hypoxia, unresponsive EXAM: CT HEAD WITHOUT CONTRAST TECHNIQUE: Contiguous axial images were obtained from the base of the skull through the vertex without intravenous contrast. RADIATION DOSE REDUCTION: This exam was performed according to the departmental dose-optimization program which includes automated exposure control, adjustment of the mA and/or kV according to patient size and/or use of iterative reconstruction technique. COMPARISON:  03/24/2022 FINDINGS: Brain: No acute infarct or hemorrhage. Chronic hypodensity right basal ganglia consistent with dilated perivascular space versus previous lacunar infarct, stable. Lateral ventricles and remaining midline structures are unremarkable. No acute extra-axial fluid collections. No mass effect. Vascular: No hyperdense vessel or unexpected calcification. Skull: Normal. Negative for fracture or focal lesion. Sinuses/Orbits:  No acute finding. Other: None. IMPRESSION: 1. No acute intracranial process. Electronically Signed   By: Sharlet Salina M.D.   On: 01/27/2023 17:54   DG Chest Portable 1 View  Result Date: 01/27/2023 CLINICAL DATA:  hypoxia, seizure vs syncope with emesis and cough now EXAM: PORTABLE CHEST 1 VIEW COMPARISON:  CXR 03/24/22 FINDINGS: No pleural effusion. No pneumothorax. There is hazy opacity at the left lung base could represent atelectasis, infection, or aspiration. Normal cardiac and mediastinal contours. No radiographically apparent displaced rib fractures. Visualized upper abdomen is unremarkable. Degenerative changes of the left glenohumeral and AC joint. IMPRESSION: Hazy opacity at the left lung base could represent atelectasis, infection, or aspiration. Electronically Signed   By: Lorenza Cambridge M.D.   On: 01/27/2023 15:04      LOS: 1 day    Joycelyn Das, MD [facility] Available via Epic secure chat 7am-7pm After these hours, please refer to coverage provider listed on [email or web address] 01/29/2023, 9:35 AM

## 2023-01-30 ENCOUNTER — Inpatient Hospital Stay (HOSPITAL_COMMUNITY): Payer: Medicare Other

## 2023-01-30 ENCOUNTER — Other Ambulatory Visit: Payer: Self-pay | Admitting: Home Health

## 2023-01-30 DIAGNOSIS — R55 Syncope and collapse: Secondary | ICD-10-CM

## 2023-01-30 DIAGNOSIS — R569 Unspecified convulsions: Secondary | ICD-10-CM | POA: Diagnosis not present

## 2023-01-30 DIAGNOSIS — N179 Acute kidney failure, unspecified: Secondary | ICD-10-CM

## 2023-01-30 DIAGNOSIS — R4020 Unspecified coma: Secondary | ICD-10-CM | POA: Diagnosis not present

## 2023-01-30 DIAGNOSIS — R9389 Abnormal findings on diagnostic imaging of other specified body structures: Secondary | ICD-10-CM | POA: Diagnosis not present

## 2023-01-30 DIAGNOSIS — G40909 Epilepsy, unspecified, not intractable, without status epilepticus: Secondary | ICD-10-CM

## 2023-01-30 DIAGNOSIS — J9601 Acute respiratory failure with hypoxia: Secondary | ICD-10-CM

## 2023-01-30 DIAGNOSIS — R7989 Other specified abnormal findings of blood chemistry: Secondary | ICD-10-CM | POA: Diagnosis not present

## 2023-01-30 DIAGNOSIS — I10 Essential (primary) hypertension: Secondary | ICD-10-CM

## 2023-01-30 DIAGNOSIS — J690 Pneumonitis due to inhalation of food and vomit: Secondary | ICD-10-CM

## 2023-01-30 LAB — BASIC METABOLIC PANEL
Anion gap: 8 (ref 5–15)
BUN: 15 mg/dL (ref 8–23)
CO2: 26 mmol/L (ref 22–32)
Calcium: 8.2 mg/dL — ABNORMAL LOW (ref 8.9–10.3)
Chloride: 100 mmol/L (ref 98–111)
Creatinine, Ser: 1.03 mg/dL — ABNORMAL HIGH (ref 0.44–1.00)
GFR, Estimated: 54 mL/min — ABNORMAL LOW (ref >60)
Glucose, Bld: 101 mg/dL — ABNORMAL HIGH (ref 70–99)
Potassium: 3.7 mmol/L (ref 3.5–5.1)
Sodium: 134 mmol/L — ABNORMAL LOW (ref 135–145)

## 2023-01-30 LAB — CBC
HCT: 35.8 % — ABNORMAL LOW (ref 36.0–46.0)
Hemoglobin: 11.3 g/dL — ABNORMAL LOW (ref 12.0–15.0)
MCH: 28.3 pg (ref 26.0–34.0)
MCHC: 31.6 g/dL (ref 30.0–36.0)
MCV: 89.7 fL (ref 80.0–100.0)
Platelets: 201 10*3/uL (ref 150–400)
RBC: 3.99 MIL/uL (ref 3.87–5.11)
RDW: 15.1 % (ref 11.5–15.5)
WBC: 15.3 10*3/uL — ABNORMAL HIGH (ref 4.0–10.5)
nRBC: 0 % (ref 0.0–0.2)

## 2023-01-30 LAB — MAGNESIUM: Magnesium: 2 mg/dL (ref 1.7–2.4)

## 2023-01-30 NOTE — Progress Notes (Signed)
Physical Therapy Treatment Patient Details Name: Katherine Obrien MRN: [id number] DOB: Jun 01, 1941 Today's Date: 01/30/2023   History of Present Illness 82 y.o. female presents to [facility] on 01/27/2023 after being found unconscious at [facility]. Chest x-ray concerning for aspiration PNA. EEG negative. PMH includes seizure disorder, HTN, CKDIII.    PT Comments    Pt received in supine and agreeable to session. Pt making good progress towards functional mobility goals and is able to perform all mobility tasks with up to supervision this session.  Pt able to tolerate significantly increased gait distance this session demonstrating no unsteadiness or LOB with rollator support. Pt on RA throughout session with intermittent brief drops in SpO2 to 83-87% that improved with breaks and pursed lip breathing. Pt requiring 2 seated rest breaks due to fatigue, however was motivated to increase gait distance. Pt continues to benefit from PT services to progress toward functional mobility goals.     Recommendations for follow up therapy are one component of a multi-disciplinary discharge planning process, led by the attending physician.  Recommendations may be updated based on patient status, additional functional criteria and insurance authorization.     Assistance Recommended at Discharge PRN  Patient can return home with the following A little help with bathing/dressing/bathroom;Assistance with cooking/housework;Assist for transportation;Help with stairs or ramp for entrance   Equipment Recommendations  None recommended by PT    Recommendations for Other Services       Precautions / Restrictions Precautions Precautions: Fall Precaution Comments: SpO2 Restrictions Weight Bearing Restrictions: No     Mobility  Bed Mobility Overal bed mobility: Needs Assistance Bed Mobility: Supine to Sit     Supine to sit: Supervision, HOB elevated     General bed mobility comments: increased time     Transfers Overall transfer level: Needs assistance Equipment used: Rollator (4 wheels) Transfers: Sit to/from Stand Sit to Stand: Supervision           General transfer comment: From low EOB x1 and rollator x2 with slow power up and supervision for safety    Ambulation/Gait Ambulation/Gait assistance: Supervision Gait Distance (Feet): 340 Feet (+75) Assistive device: Rollator (4 wheels) Gait Pattern/deviations: Step-through pattern, Trunk flexed       General Gait Details: Pt demonstrating good pace, but unable to improve upright posture with cues. Pt requiring 2 seated rest breaks due to fatigue.       Balance Overall balance assessment: Needs assistance Sitting-balance support: No upper extremity supported, Feet supported Sitting balance-Leahy Scale: Good Sitting balance - Comments: sitting EOB   Standing balance support: Reliant on assistive device for balance, Bilateral upper extremity supported, During functional activity Standing balance-Leahy Scale: Fair Standing balance comment: with rollator support                            Cognition Arousal/Alertness: Awake/alert Behavior During Therapy: WFL for tasks assessed/performed Overall Cognitive Status: Within Functional Limits for tasks assessed                                          Exercises      General Comments General comments (skin integrity, edema, etc.): Pt on RA throughout session. Pt's SpO2 mostly 89-92% with intermittent drops as low as 83% briefly during ambulation. Pt instructed to take a standing rest break and perform pursed lip breathing with SpO2  improving. SpO2 at 93% after gait trial sitting in the recliner on RA.      Pertinent Vitals/Pain Pain Assessment Pain Assessment: No/denies pain     PT Goals (current goals can now be found in the care plan section) Acute Rehab PT Goals Patient Stated Goal: to return to prior level of function PT Goal  Formulation: With patient Time For Goal Achievement: 02/11/23 Potential to Achieve Goals: Good Progress towards PT goals: Progressing toward goals    Frequency    Min 3X/week      PT Plan Current plan remains appropriate       AM-PAC PT "6 Clicks" Mobility   Outcome Measure  Help needed turning from your back to your side while in a flat bed without using bedrails?: A Little Help needed moving from lying on your back to sitting on the side of a flat bed without using bedrails?: A Little Help needed moving to and from a bed to a chair (including a wheelchair)?: A Little Help needed standing up from a chair using your arms (e.g., wheelchair or bedside chair)?: A Little Help needed to walk in hospital room?: A Little Help needed climbing 3-5 steps with a railing? : A Lot 6 Click Score: 17    End of Session   Activity Tolerance: Patient tolerated treatment well Patient left: in chair;with chair alarm set;with call bell/phone within reach Nurse Communication: Mobility status PT Visit Diagnosis: Other abnormalities of gait and mobility (R26.89);Muscle weakness (generalized) (M62.81)     Time: 4098-1191 PT Time Calculation (min) (ACUTE ONLY): 48 min  Charges:  $Gait Training: 38-52 mins                     Johny Shock, PTA Acute Rehabilitation Services Secure Chat Preferred  [phone]    Johny Shock 01/30/2023, 12:16 PM

## 2023-01-30 NOTE — Procedures (Signed)
Patient Name: Katherine Obrien  MRN: [id number]  Epilepsy Attending: Charlsie Quest  Referring Physician/Provider: Caryl Pina, MD  Date: 01/30/2023 Duration: 23.48 mins  Patient history: 82 y.o. female with medical history significant of seizure disorder not on antiepileptics, HTN, CKD3 who presents from [facility] after an unwitness event where she was found to be unconscious. EEG to evaluate for seizure.   Level of alertness:  Awake, asleep   AEDs during EEG study: None   Technical aspects: This EEG study was done with scalp electrodes positioned according to the 10-20 International system of electrode placement. Electrical activity was reviewed with band pass filter of 1-70Hz , sensitivity of 7 uV/mm, display speed of 64mm/sec with a 60Hz  notched filter applied as appropriate. EEG data were recorded continuously and digitally stored.  Video monitoring was available and reviewed as appropriate.   Description: The posterior dominant rhythm consists of 8-9 Hz activity of moderate voltage (25-35 uV) seen predominantly in posterior head regions, symmetric and reactive to eye opening and eye closing. Sleep was characterized by vertex waves, sleep spindles (12 to 14 Hz), maximal frontocentral region. EEG showed intermittent generalized 3 to 6 Hz theta-delta slowing. Physiologic photic driving was seen during photic stimulation. Hyperventilation was not performed.      ABNORMALITY - Intermittent slow, generalized   IMPRESSION: This study is suggestive of mild diffuse encephalopathy, nonspecific etiology.  No seizures or epileptiform discharges were seen throughout the recording.   Katherine Obrien

## 2023-01-30 NOTE — Progress Notes (Signed)
EEG complete - results pending 

## 2023-01-30 NOTE — Progress Notes (Signed)
30 days event monitor ordered for syncope per neurology team request, office staff will arrange, Dr Izora Ribas to see as new patient outpatient.

## 2023-01-30 NOTE — TOC Initial Note (Addendum)
Transition of Care [facility]) - Initial/Assessment Note    Patient Details  Name: Katherine Obrien MRN: [id number] Date of Birth: 11-19-1940  Transition of Care [facility]) CM/SW Contact:    Kingsley Plan, RN Phone Number: 01/30/2023, 3:26 PM  Clinical Narrative:                  Spoke to patient at bedside.   Patient from independent living at [facility], currently receiving home health through [facility] and would like to continue. PT recommending HHPT. Called [facility] left a message for [place] . Await call back   Will need to fax orders to [facility].   Patient has a rollator , no DME recommendations.   Patient wants to discuss EEG with MD , she dropped her IS on floor and would like a clean one. Secure chatted team.    If home oxygen needed will need ambulation oxygen saturation note and order  Expected Discharge Plan: Home w Home Health Services     Patient Goals and CMS Choice Patient states their goals for this hospitalization and ongoing recovery are:: to return to home CMS Medicare.gov Compare Post Acute Care list provided to:: Patient Choice offered to / list presented to : Patient [place] ownership interest in [facility].provided to:: Patient    Expected Discharge Plan and Services   Discharge Planning Services: CM Consult Post Acute Care Choice: Home Health Living arrangements for the past 2 months: Apartment ([facility])                 DME Arranged: N/A DME Agency: NA       HH Arranged: PT          Prior Living Arrangements/Services Living arrangements for the past 2 months: Apartment ([facility]) Lives with:: Self Patient language and need for interpreter reviewed:: Yes Do you feel safe going back to the place where you live?: Yes          Current home services: DME Criminal Activity/Legal Involvement Pertinent to Current Situation/Hospitalization: No - Comment as  needed  Activities of Daily Living Home Assistive Devices/Equipment: Environmental consultant (specify type) ADL Screening (condition at time of admission) Patient's cognitive ability adequate to safely complete daily activities?: Yes Is the patient deaf or have difficulty hearing?: No Does the patient have difficulty seeing, even when wearing glasses/contacts?: No Does the patient have difficulty concentrating, remembering, or making decisions?: No Patient able to express need for assistance with ADLs?: Yes Does the patient have difficulty dressing or bathing?: No Independently performs ADLs?: Yes (appropriate for developmental age) Does the patient have difficulty walking or climbing stairs?: Yes Weakness of Legs: Both Weakness of Arms/Hands: None  Permission Sought/Granted   Permission granted to share information with : No              Emotional Assessment Appearance:: Appears stated age Attitude/Demeanor/Rapport: Engaged Affect (typically observed): Accepting Orientation: : Oriented to Self, Oriented to Place, Oriented to  Time, Oriented to Situation Alcohol / Substance Use: Not Applicable Psych Involvement: No (comment)  Admission diagnosis:  Loss of consciousness (HCC) [R40.20] Hypoxia [R09.02] Aspiration pneumonia, unspecified aspiration pneumonia type, unspecified laterality, unspecified part of lung (HCC) [J69.0] Patient Active Problem List   Diagnosis Date Noted   Abnormal finding on CT scan 01/27/2023   AKI (acute kidney injury) (HCC) 01/27/2023   Abnormal TSH 01/27/2023   Acute hypoxic respiratory failure (HCC) 01/27/2023   Aspiration pneumonia (HCC) 01/27/2023  HTN (hypertension) 01/27/2023   Memory deficit 11/17/2022   Prediabetes 09/29/2022   CKD (chronic kidney disease) stage 3, GFR 30-59 ml/min (HCC) 05/12/2022   Advanced nonexudative age-related macular degeneration of left eye with subfoveal involvement 06/30/2021   Advanced nonexudative age-related macular  degeneration of right eye without subfoveal involvement 01/13/2020   Serous detachment of retinal pigment epithelium of right eye 01/13/2020   Serous detachment of retinal pigment epithelium of left eye 01/13/2020   Pseudophakia, both eyes 01/13/2020   Loss of consciousness (HCC) 03/26/2019   Essential hypertension 03/26/2019   Actinic keratoses 12/20/2018   Elevated systolic blood pressure reading without diagnosis of hypertension 11/16/2018   UTI (urinary tract infection) 10/08/2018   Adynamic ileus (HCC) 10/08/2018   Diarrhea 10/05/2018   GERD (gastroesophageal reflux disease) 10/05/2018   Urinary hesitancy 10/05/2018   Advanced care planning/counseling discussion 10/05/2018   Hyperlipidemia 09/19/2018   Gait abnormality 09/17/2018   Macular degeneration 09/17/2018   Dry eyes 09/17/2018   History of diverticulitis 09/17/2018   Seizure disorder (HCC) 09/17/2018   Hx of vertebral fracture repair 09/17/2018   Obese 09/17/2018   PCP:  Mast, Man X, NP Pharmacy:   [facility] [place], [place] - [address] [place] [address] Phone: [phone] Fax: [phone]     Social Determinants of Health (SDOH) Social History: SDOH Screenings   Food Insecurity: No Food Insecurity (01/28/2023)  Housing: Low Risk  (01/28/2023)  Transportation Needs: No Transportation Needs (01/28/2023)  Utilities: Not At Risk (01/28/2023)  Depression (PHQ2-9): Low Risk  (11/17/2022)  Tobacco Use: Low Risk  (01/27/2023)   SDOH Interventions:     Readmission Risk Interventions     No data to display

## 2023-01-30 NOTE — Progress Notes (Signed)
Family called and updated 

## 2023-01-30 NOTE — Progress Notes (Signed)
PROGRESS NOTE    Katherine Obrien  WJX:[id number] DOB: 1941/09/03 DOA: 01/27/2023 PCP: Mast, Man X, NP    Brief Narrative:  Katherine Obrien is a 82 y.o. female with past medical history of seizure disorder not on medications, hypertension, CKD stage III [facility] after on witnessed event when she was found to be unconscious.  Patient does not recall the event.  No mention of witnessed seizure but appeared postictal with vomiting.  Patient does have history of seizure and had taken Keppra, phenobarbital, carbamazepine, Topamax and Zonegran this had caused her drowsiness.  Reported last seizure activity was 08/2022.  In the ED, patient was noted to be hypoxic with pulse ox of 87% on room air and was febrile at 100.3 F with tachycardia but blood pressure was elevated.  Had leukocytosis and chest x-ray showed left basilar hazy opacity concerning for aspiration pneumonia.  D-dimer was elevated but CTA of the chest was negative for PE but pneumonia was present.  CT head was negative.  Neurology Dr. Amada Jupiter was notified and patient was admitted hospital for EEG and further evaluation by neurology.  Patient has been started on Unasyn for aspiration pneumonia as well.    Assessment and Plan:  * Loss of consciousness likely syncope. Could be postictal situation but no witnessed activity of seizure.  History of seizures not on antiepileptics.  Neurology wanting to monitor with EEG.  Patient with an impression about not wanting to be on antiepileptics due to sedation in the past.  As per the patient Keppra, phenobarbital, carbamazepine, Topamax and Zonegran appears to have been trialed in the past.  Continue seizure precautions. Placed on seizure precaution.  EEG was done with without any signs of seizure-like activity but encephalopathic findings.  Neurology has seen the patient at this time and concern for syncope rather than seizures.   Neurology recommends assessing for possible arrhythmia and Zio patch on discharge.  No antiepileptics recommended.  Continue telemetry monitor.  Spoke with the patient's son who states that patient does have frequent seizures at baseline.  Sepsis secondary to aspiration pneumonia.   Patient had signs of sepsis including chest x-ray/ CT scan with infiltrate, hypoxia with fever and tachycardia leukocytosis on presentation.   WBC has trended up to 31.2 K on 01/28/2023 and has slightly trended down to 15.3 K today.  Continue with IV Unasyn.  Closely trend CBC.  Still has oxygen requirement we will continue to monitor closely.  HTN (hypertension) Elevated blood pressure on presentation. Not on antihypertensives at home.  Continue as needed labetalol.  Latest blood pressure of 124/67.  Acute hypoxic respiratory failure (HCC) Likely aspiration pneumonia.  Opacities seen on CT and chest x-ray.  Patient has been weaned to room air but was hypoxic on ambulation.  Will continue current level of treatment.  Abnormal TSH -Mildly elevated TSH at 4.566. Recommend follow up outpatient   AKI (acute kidney injury) -creatinine elevated at 1.15 from prior of 0.93.  Received IV fluids.  Creatinine today at 1.0.  Will continue to monitor BMP.   Abnormal finding on CT scan -Indeterminate 4 cm hypodense lesion in the left lobe of the liver. Additional exophytic lesion arising from the left kidney. Further evaluation of the liver and renal lesions with ultrasound on a nonemergent/outpatient basis recommended.   Deconditioning, debility, weakness.  Patient lives with herself at home.  Physical therapy has recommended home health PT on discharge.  DVT prophylaxis: enoxaparin (LOVENOX) injection 40 mg Start: 01/27/23 2200   Code Status:     Code Status: DNR  [facility] on  01/31/2023 if she continues to improve with improved blood counts and oxygenation remained  stable..  Status is: Inpatient  The patient is  inpatient because: Aspiration pneumonia with sepsis, possible syncope IV antibiotic,  significant leukocytosis hypoxia   Family Communication:  Patient's son on the phone and updated him about the clinical condition of the patient and potential disposition on 01/31/2023 if she remains stable.    Consultants:  Neurology  Procedures:  EEG  Antimicrobials:  Unasyn IV  Anti-infectives (From admission, onward)    Start     Dose/Rate Route Frequency Ordered Stop   01/28/23 0000  Ampicillin-Sulbactam (UNASYN) 3 g in sodium chloride 0.9 % 100 mL IVPB        3 g 200 mL/hr over 30 Minutes Intravenous Every 6 hours 01/27/23 2053     01/27/23 1615  Ampicillin-Sulbactam (UNASYN) 3 g in sodium chloride 0.9 % 100 mL IVPB        3 g 200 mL/hr over 30 Minutes Intravenous  Once 01/27/23 1609 01/27/23 1834      Subjective: Today, patient was seen and examined at bedside.  Overall continues to feel better.  Still has some cough with greenish productive sputum.  Was hypoxic on ambulation.    Objective: Vitals:   01/29/23 1551 01/29/23 1932 01/30/23 0345 01/30/23 0734  BP: 138/65 125/64 (!) 162/73 (!) 140/53  Pulse: 74 83 81 80  Resp: 16 17 17 16   Temp: 98.2 F (36.8 C) 98.6 F (37 C) 98.9 F (37.2 C) (!) 97.5 F (36.4 C)  TempSrc: Oral Oral Oral Oral  SpO2: 94% 93% 95% 91%  Weight:      Height:        Intake/Output Summary (Last 24 hours) at 01/30/2023 1340 Last data filed at 01/30/2023 0400 Gross per 24 hour  Intake 660 ml  Output --  Net 660 ml    Filed Weights   01/27/23 1337  Weight: 90.7 kg    Physical Examination: Body mass index is 35.43 kg/m.   General: Alert awake and Communicative, obese build.   HENT:   No scleral pallor or icterus noted. Oral mucosa is moist.  Chest:   Coarse breath sounds noted.   CVS: S1 &S2 heard. No murmur.  Regular rate and rhythm. Abdomen: Soft, nontender, nondistended.  Bowel sounds are  heard.   Extremities: No cyanosis, clubbing or edema.  Peripheral pulses are palpable. Psych: Alert, awake and oriented, normal mood CNS:  No cranial nerve deficits.  Power equal in all extremities.   Skin: Warm and dry.  No rashes noted.  Data Reviewed:   CBC: Recent Labs  Lab 01/27/23 1353 01/28/23 0043 01/29/23 0108 01/30/23 0029  WBC 13.5* 31.2* 23.0* 15.3*  HGB 13.4 12.7 11.9* 11.3*  HCT 42.8 39.8 37.6 35.8*  MCV 90.1 88.4 89.7 89.7  PLT 271 236 221 201     Basic Metabolic Panel: Recent Labs  Lab 01/27/23 1353 01/28/23 0043 01/29/23 0108 01/30/23 0029  NA 140 138 138 134*  K 3.5 4.1 3.6 3.7  CL 101 102 102 100  CO2 27 25 26 26   GLUCOSE 149* 118* 133* 101*  BUN 22 19 18 15   CREATININE 1.15* 1.18* 1.20* 1.03*  CALCIUM 9.1 8.4* 8.2* 8.2*  MG  --   --  1.9 2.0  Liver Function Tests: Recent Labs  Lab 01/27/23 1510  AST 22  ALT 20  ALKPHOS 70  BILITOT 0.2*  PROT 7.0  ALBUMIN 3.5      Radiology Studies: EEG adult  Result Date: February 11, 2023 Katherine Quest, MD     11-Feb-2023 11:45 AM Patient Name: Katherine Obrien MRN: [id number] Epilepsy Attending: Charlsie Obrien Referring Physician/Provider: Caryl Pina, MD Date: Feb 11, 2023 Duration: 23.48 mins Patient history: 82 y.o. female with medical history significant of seizure disorder not on antiepileptics, HTN, CKD3 who presents from [facility] after an unwitness event where she was found to be unconscious. EEG to evaluate for seizure. Level of alertness:  Awake, asleep  AEDs during EEG study: None  Technical aspects: This EEG study was done with scalp electrodes positioned according to the 10-20 International system of electrode placement. Electrical activity was reviewed with band pass filter of 1-70Hz , sensitivity of 7 uV/mm, display speed of 14mm/sec with a 60Hz  notched filter applied as appropriate. EEG data were recorded continuously and digitally stored.  Video monitoring  was available and reviewed as appropriate.  Description: The posterior dominant rhythm consists of 8-9 Hz activity of moderate voltage (25-35 uV) seen predominantly in posterior head regions, symmetric and reactive to eye opening and eye closing. Sleep was characterized by vertex waves, sleep spindles (12 to 14 Hz), maximal frontocentral region. EEG showed intermittent generalized 3 to 6 Hz theta-delta slowing. Physiologic photic driving was seen during photic stimulation. Hyperventilation was not performed.    ABNORMALITY - Intermittent slow, generalized  IMPRESSION: This study is suggestive of mild diffuse encephalopathy, nonspecific etiology.  No seizures or epileptiform discharges were seen throughout the recording.  Katherine Obrien       LOS: 2 days    Joycelyn Das, MD [facility] Available via Epic secure chat 7am-7pm After these hours, please refer to coverage provider listed on [email or web address] 02-11-23, 1:40 PM

## 2023-01-31 ENCOUNTER — Telehealth: Payer: Self-pay

## 2023-01-31 ENCOUNTER — Inpatient Hospital Stay (HOSPITAL_COMMUNITY): Payer: Medicare Other

## 2023-01-31 ENCOUNTER — Encounter: Payer: Self-pay | Admitting: *Deleted

## 2023-01-31 DIAGNOSIS — R4020 Unspecified coma: Secondary | ICD-10-CM | POA: Diagnosis not present

## 2023-01-31 DIAGNOSIS — R55 Syncope and collapse: Secondary | ICD-10-CM

## 2023-01-31 DIAGNOSIS — R7989 Other specified abnormal findings of blood chemistry: Secondary | ICD-10-CM | POA: Diagnosis not present

## 2023-01-31 DIAGNOSIS — R9389 Abnormal findings on diagnostic imaging of other specified body structures: Secondary | ICD-10-CM | POA: Diagnosis not present

## 2023-01-31 DIAGNOSIS — J690 Pneumonitis due to inhalation of food and vomit: Secondary | ICD-10-CM | POA: Diagnosis not present

## 2023-01-31 LAB — ECHOCARDIOGRAM COMPLETE
Area-P 1/2: 2.39 cm2
Calc EF: 63.9 %
Height: 63 in
MV M vel: 3.37 m/s
MV Peak grad: 45.4 mmHg
MV VTI: 2.04 cm2
S' Lateral: 2.1 cm
Single Plane A2C EF: 61.8 %
Single Plane A4C EF: 65.8 %
Weight: 3200 oz

## 2023-01-31 LAB — CBC
HCT: 36 % (ref 36.0–46.0)
Hemoglobin: 11.6 g/dL — ABNORMAL LOW (ref 12.0–15.0)
MCH: 28.1 pg (ref 26.0–34.0)
MCHC: 32.2 g/dL (ref 30.0–36.0)
MCV: 87.2 fL (ref 80.0–100.0)
Platelets: 231 10*3/uL (ref 150–400)
RBC: 4.13 MIL/uL (ref 3.87–5.11)
RDW: 14.8 % (ref 11.5–15.5)
WBC: 12.7 10*3/uL — ABNORMAL HIGH (ref 4.0–10.5)
nRBC: 0 % (ref 0.0–0.2)

## 2023-01-31 LAB — BASIC METABOLIC PANEL
Anion gap: 9 (ref 5–15)
BUN: 15 mg/dL (ref 8–23)
CO2: 26 mmol/L (ref 22–32)
Calcium: 8.3 mg/dL — ABNORMAL LOW (ref 8.9–10.3)
Chloride: 101 mmol/L (ref 98–111)
Creatinine, Ser: 1.12 mg/dL — ABNORMAL HIGH (ref 0.44–1.00)
GFR, Estimated: 49 mL/min — ABNORMAL LOW (ref >60)
Glucose, Bld: 134 mg/dL — ABNORMAL HIGH (ref 70–99)
Potassium: 3.2 mmol/L — ABNORMAL LOW (ref 3.5–5.1)
Sodium: 136 mmol/L (ref 135–145)

## 2023-01-31 LAB — MAGNESIUM: Magnesium: 1.9 mg/dL (ref 1.7–2.4)

## 2023-01-31 MED ORDER — POTASSIUM CHLORIDE CRYS ER 20 MEQ PO TBCR
40.0000 meq | EXTENDED_RELEASE_TABLET | Freq: Once | ORAL | Status: AC
  Administered 2023-01-31: 40 meq via ORAL
  Filled 2023-01-31: qty 2

## 2023-01-31 MED ORDER — GUAIFENESIN ER 600 MG PO TB12: 600.0000 mg | ORAL_TABLET | Freq: Two times a day (BID) | ORAL | 0 refills | Status: AC

## 2023-01-31 MED ORDER — AMOXICILLIN-POT CLAVULANATE 875-125 MG PO TABS: 1.0000 | ORAL_TABLET | Freq: Two times a day (BID) | ORAL | 0 refills | Status: AC

## 2023-01-31 NOTE — Progress Notes (Signed)
Patient is being discharged back to [facility]. Discharge instructions reviewed including new medications. Pt also informed that cardiology will be mailing a heart monitor. Pt verbalized a full understanding of instructions. Pts son is her ride back to [facility].

## 2023-01-31 NOTE — Progress Notes (Signed)
Patient enrolled for [employer]/ [employer] to ship a 30 day cardiac event monitor to her address on file.  Letter with instructions mailed to patient.  Dr. Izora Ribas to read.

## 2023-01-31 NOTE — Care Management Important Message (Signed)
Important Message  Patient Details  Name: Katherine Obrien MRN: [id number] Date of Birth: [birth year]   Medicare Important Message Given:  Yes     Sherilyn Banker 01/31/2023, 12:06 PM

## 2023-01-31 NOTE — Transitions of Care (Post Inpatient/ED Visit) (Signed)
   01/31/2023  Name: Katherine Obrien MRN: [id number] DOB: 06-24-1941  Today's TOC FU Call Status: Today's TOC FU Call Status:: Unsuccessul Call (1st Attempt) Unsuccessful Call (1st Attempt) Date: 01/31/23  Attempted to reach the patient regarding the most recent Inpatient/ED visit.  Follow Up Plan: Additional outreach attempts will be made to reach the patient to complete the Transitions of Care (Post Inpatient/ED visit) call.   Signature : Guss Bunde [facility]

## 2023-01-31 NOTE — Discharge Summary (Signed)
Physician Discharge Summary  Katherine Obrien ZOX:[id number] DOB: [birth year] DOA: 01/27/2023  PCP: Mast, Man X, NP  Admit date: 01/27/2023 Discharge date: 01/31/2023  Admitted From: Home  Discharge disposition: Home with home health   Recommendations for Outpatient Follow-Up:   Follow up with your primary care provider in one week.   Consider follow-up chest x-ray in 4 to 6 weeks. Check CBC, BMP, magnesium in the next visit Cardiology to follow-up for Zio patch as outpatient.  Discharge Diagnosis:   Principal Problem:   Loss of consciousness (HCC) Active Problems:   Seizure disorder (HCC)   Abnormal finding on CT scan   AKI (acute kidney injury) (HCC)   Abnormal TSH   Acute hypoxic respiratory failure (HCC)   Aspiration pneumonia (HCC)   HTN (hypertension)  Discharge Condition: Improved.  Diet recommendation: Low sodium, heart healthy.    Wound care: None.  Code status: DNR   History of Present Illness:   Katherine Obrien is a 82 y.o. female with past medical history of seizure disorder not on medications, hypertension, CKD stage III [facility] after on witnessed event when she was found to be unconscious.  Patient does not recall the event.  No mention of witnessed seizure but appeared postictal with vomiting.  Patient does have history of seizure and had taken Keppra, phenobarbital, carbamazepine, Topamax and Zonegran this had caused her drowsiness.  Reported last seizure activity was 08/2022.  In the ED, patient was noted to be hypoxic with pulse ox of 87% on room air and was febrile at 100.3 F with tachycardia but blood pressure was elevated.  Had leukocytosis and chest x-ray showed left basilar hazy opacity concerning for aspiration pneumonia.  D-dimer was elevated but CTA of the chest was negative for PE but pneumonia was present.  CT head was negative.  Neurology Dr. Amada Jupiter was notified and patient  was admitted hospital for EEG and further evaluation by neurology.  Patient has been started on Unasyn for aspiration pneumonia as well.    Hospital Course:   Following conditions were addressed during hospitalization as listed below,  Loss of consciousness likely syncope. No witnessed activity of seizure.  History of seizures not on antiepileptics.   Patient  not wanting to be on antiepileptics due to sedation in the past.  As per the patient Keppra, phenobarbital, carbamazepine, Topamax and Zonegran appears to have been trialed in the past.    EEG was done with without any signs of seizure-like activity but encephalopathic findings.  Neurology has seen the patient at this time and concern for syncope rather than seizures.  Neurology recommends assessing for possible arrhythmia and allergy was consulted for Zio patch on discharge.  No antiepileptics recommended.   Spoke with the patient's son who states that patient does have frequent seizures at baseline.   Sepsis secondary to aspiration pneumonia.   Patient had signs of sepsis including chest x-ray/ CT scan with infiltrate, hypoxia with fever and tachycardia leukocytosis on presentation.  Leukocytosis has trended down.  Received IV Unasyn during hospitalization and will be changed to Augmentin on discharge to complete the course.  Patient was initially on supplemental oxygen which has been discontinued at this time.   Mild hypokalemia.  Will be given p.o. potassium prior to discharge.  HTN (hypertension) Elevated blood pressure on presentation. Not on antihypertensives at home.  Will need outpatient monitoring of blood pressure.    Acute hypoxic respiratory failure (HCC) Likely aspiration  pneumonia.  Opacities seen on CT and chest x-ray.  As clinically improved.  Currently off oxygen.  Abnormal TSH -Mildly elevated TSH at 4.566. Recommend follow up outpatient   AKI (acute kidney injury) -creatinine elevated at 1.15 from prior of 0.93.   Received IV fluids during hospitalization..  Creatinine today at 1.1.     Abnormal finding on CT scan -Indeterminate 4 cm hypodense lesion in the left lobe of the liver. Additional exophytic lesion arising from the left kidney. Further evaluation of the liver and renal lesions with ultrasound on a nonemergent/outpatient basis recommended.    Deconditioning, debility, weakness.  Patient lives with herself at home.  Physical therapy has recommended home health PT on discharge.  Disposition.  At this time, patient is stable for disposition home with home health with outpatient PCP follow-up.  Medical Consultants:   None.  Procedures:    None Subjective:   Today, patient was seen and examined at bedside.  States that her breathing is improved.  Did not require any supplemental oxygen prior to discharge.  Discharge Exam:   Vitals:   01/31/23 0408 01/31/23 0752  BP: (!) 148/70 (!) 140/62  Pulse: 79 76  Resp:  17  Temp: 98.4 F (36.9 C) 97.8 F (36.6 C)  SpO2: 95% 91%   Vitals:   01/30/23 1556 01/30/23 2233 01/31/23 0408 01/31/23 0752  BP: (!) 149/62 139/70 (!) 148/70 (!) 140/62  Pulse: 73 81 79 76  Resp: 16   17  Temp: 98.6 F (37 C) 97.9 F (36.6 C) 98.4 F (36.9 C) 97.8 F (36.6 C)  TempSrc: Oral Oral Oral Oral  SpO2: 97% 95% 95% 91%  Weight:      Height:       Body mass index is 35.43 kg/m.   General: Alert awake, not in obvious distress, obese built, HENT: pupils equally reacting to light,  No scleral pallor or icterus noted. Oral mucosa is moist.  Chest:Diminished breath sounds bilaterally.  CVS: S1 &S2 heard. No murmur.  Regular rate and rhythm. Abdomen: Soft, nontender, nondistended.  Bowel sounds are heard.   Extremities: No cyanosis, clubbing or edema.  Peripheral pulses are palpable. Psych: Alert, awake and oriented, normal mood CNS:  No cranial nerve deficits.  Power equal in all extremities.   Skin: Warm and dry.  No rashes noted.  The results of  significant diagnostics from this hospitalization (including imaging, microbiology, ancillary and laboratory) are listed below for reference.     Diagnostic Studies:   EEG adult  Result Date: 01/28/2023 Charlsie Quest, MD     01/28/2023  7:21 AM Patient Name: Katherine Obrien MRN: [id number] Epilepsy Attending: Charlsie Quest Referring Physician/Provider: Tegeler, Canary Brim, MD Date: 01/28/2023 Duration: 23.30 mins Patient history:  82 y.o. female with medical history significant of seizure disorder not on antiepileptics, HTN, CKD3 who [facility] after an unwitness event where she was found to be unconscious. EEG to evaluate for seizure. Level of alertness: Awake, asleep AEDs during EEG study: None Technical aspects: This EEG study was done with scalp electrodes positioned according to the 10-20 International system of electrode placement. Electrical activity was reviewed with band pass filter of 1-70Hz , sensitivity of 7 uV/mm, display speed of 58mm/sec with a 60Hz  notched filter applied as appropriate. EEG data were recorded continuously and digitally stored.  Video monitoring was available and reviewed as appropriate. Description: The posterior dominant rhythm consists of 8-9 Hz activity of moderate voltage (25-35  uV) seen predominantly in posterior head regions, symmetric and reactive to eye opening and eye closing. Sleep was characterized by vertex waves, sleep spindles (12 to 14 Hz), maximal frontocentral region. EEG showed intermittent generalized 3 to 6 Hz theta-delta slowing. Physiologic photic driving was not seen during photic stimulation. Hyperventilation was not performed.   ABNORMALITY - Intermittent slow, generalized IMPRESSION: This study is suggestive of mild diffuse encephalopathy, nonspecific etiology.  No seizures or epileptiform discharges were seen throughout the recording. Charlsie Quest   CT Angio Chest PE W and/or Wo  Contrast  Result Date: 01/27/2023 CLINICAL DATA:  Concern for pulmonary embolism. EXAM: CT ANGIOGRAPHY CHEST WITH CONTRAST TECHNIQUE: Multidetector CT imaging of the chest was performed using the standard protocol during bolus administration of intravenous contrast. Multiplanar CT image reconstructions and MIPs were obtained to evaluate the vascular anatomy. RADIATION DOSE REDUCTION: This exam was performed according to the departmental dose-optimization program which includes automated exposure control, adjustment of the mA and/or kV according to patient size and/or use of iterative reconstruction technique. CONTRAST:  75mL OMNIPAQUE IOHEXOL 350 MG/ML SOLN COMPARISON:  Chest radiograph dated 01/27/2023. FINDINGS: Evaluation of this exam is limited due to respiratory motion artifact as well as due to streak artifact caused by patient's arms. Cardiovascular: There is no cardiomegaly or pericardial effusion. Coronary vascular calcification predominantly involving the LAD. Mild atherosclerotic calcification of the thoracic aorta. No aneurysmal dilatation or dissection. The origins of the great vessels of the aortic arch appear patent. Evaluation of the pulmonary arteries is limited due to factors above. No definite large or central pulmonary artery embolus identified. Mediastinum/Nodes: Top-normal mediastinal lymph nodes measure up to 1 cm in short axis in the prevascular space. The esophagus is grossly unremarkable. No mediastinal fluid collection. Lungs/Pleura: Bilateral reticulonodular pulmonary opacities, left greater right may represent edema, pneumonia, or combination. Clinical correlation is recommended. No pleural effusion or pneumothorax. The central airways are patent. Upper Abdomen: Indeterminate 4 cm hypodense lesion in the left lobe of the liver. Additional exophytic lesion arising from the left kidney. Further evaluation of the liver and renal lesions with ultrasound on a nonemergent/outpatient basis  recommended. Gallstones. Musculoskeletal: Osteopenia with degenerative changes of the spine. No acute osseous pathology. Review of the MIP images confirms the above findings. IMPRESSION: 1. No CT evidence of central pulmonary artery embolus. 2. Bilateral reticulonodular pulmonary opacities, left greater right may represent edema, pneumonia, or combination. 3.  Aortic Atherosclerosis (ICD10-I70.0). Electronically Signed   By: Elgie Collard M.D.   On: 01/27/2023 19:39   CT HEAD WO CONTRAST ( )  Result Date: 01/27/2023 CLINICAL DATA:  Syncope, hypoxia, unresponsive EXAM: CT HEAD WITHOUT CONTRAST TECHNIQUE: Contiguous axial images were obtained from the base of the skull through the vertex without intravenous contrast. RADIATION DOSE REDUCTION: This exam was performed according to the departmental dose-optimization program which includes automated exposure control, adjustment of the mA and/or kV according to patient size and/or use of iterative reconstruction technique. COMPARISON:  03/24/2022 FINDINGS: Brain: No acute infarct or hemorrhage. Chronic hypodensity right basal ganglia consistent with dilated perivascular space versus previous lacunar infarct, stable. Lateral ventricles and remaining midline structures are unremarkable. No acute extra-axial fluid collections. No mass effect. Vascular: No hyperdense vessel or unexpected calcification. Skull: Normal. Negative for fracture or focal lesion. Sinuses/Orbits: No acute finding. Other: None. IMPRESSION: 1. No acute intracranial process. Electronically Signed   By: Sharlet Salina M.D.   On: 01/27/2023 17:54   DG Chest Portable 1 View  Result Date: 01/27/2023  CLINICAL DATA:  hypoxia, seizure vs syncope with emesis and cough now EXAM: PORTABLE CHEST 1 VIEW COMPARISON:  CXR 03/24/22 FINDINGS: No pleural effusion. No pneumothorax. There is hazy opacity at the left lung base could represent atelectasis, infection, or aspiration. Normal cardiac and mediastinal  contours. No radiographically apparent displaced rib fractures. Visualized upper abdomen is unremarkable. Degenerative changes of the left glenohumeral and AC joint. IMPRESSION: Hazy opacity at the left lung base could represent atelectasis, infection, or aspiration. Electronically Signed   By: Lorenza Cambridge M.D.   On: 01/27/2023 15:04     Labs:   Basic Metabolic Panel: Recent Labs  Lab 01/27/23 1353 01/28/23 0043 01/29/23 0108 01/30/23 0029 01/31/23 0303  NA 140 138 138 134* 136  K 3.5 4.1 3.6 3.7 3.2*  CL 101 102 102 100 101  CO2 27 25 26 26 26   GLUCOSE 149* 118* 133* 101* 134*  BUN 22 19 18 15 15   CREATININE 1.15* 1.18* 1.20* 1.03* 1.12*  CALCIUM 9.1 8.4* 8.2* 8.2* 8.3*  MG  --   --  1.9 2.0 1.9   GFR Estimated Creatinine Clearance: 40.7 mL/min (A) (by C-G formula based on SCr of 1.12 mg/dL (H)). Liver Function Tests: Recent Labs  Lab 01/27/23 1510  AST 22  ALT 20  ALKPHOS 70  BILITOT 0.2*  PROT 7.0  ALBUMIN 3.5   Recent Labs  Lab 01/27/23 1510  LIPASE 57*   No results for input(s): "AMMONIA" in the last 168 hours. Coagulation profile No results for input(s): "INR", "PROTIME" in the last 168 hours.  CBC: Recent Labs  Lab 01/27/23 1353 01/28/23 0043 01/29/23 0108 01/30/23 0029 01/31/23 0303  WBC 13.5* 31.2* 23.0* 15.3* 12.7*  HGB 13.4 12.7 11.9* 11.3* 11.6*  HCT 42.8 39.8 37.6 35.8* 36.0  MCV 90.1 88.4 89.7 89.7 87.2  PLT 271 236 221 201 231   Cardiac Enzymes: No results for input(s): "CKTOTAL", "CKMB", "CKMBINDEX", "TROPONINI" in the last 168 hours. BNP: Invalid input(s): "POCBNP" CBG: Recent Labs  Lab 01/27/23 1343  GLUCAP 142*   D-Dimer No results for input(s): "DDIMER" in the last 72 hours. Hgb A1c No results for input(s): "HGBA1C" in the last 72 hours. Lipid Profile No results for input(s): "CHOL", "HDL", "LDLCALC", "TRIG", "CHOLHDL", "LDLDIRECT" in the last 72 hours. Thyroid function studies No results for input(s): "TSH",  "T4TOTAL", "T3FREE", "THYROIDAB" in the last 72 hours.  Invalid input(s): "FREET3" Anemia work up No results for input(s): "VITAMINB12", "FOLATE", "FERRITIN", "TIBC", "IRON", "RETICCTPCT" in the last 72 hours. Microbiology No results found for this or any previous visit (from the past 240 hour(s)).   Discharge Instructions:   Discharge Instructions     Call MD for:  difficulty breathing, headache or visual disturbances   Complete by: As directed    Call MD for:  temperature >100.4   Complete by: As directed    Diet - low sodium heart healthy   Complete by: As directed    Discharge instructions   Complete by: As directed    Follow up with your primary care provider in one week. Check blood work at that time. Complete the course of antibiotics. Seek medical attention for worsening symptoms. No overexertion   Increase activity slowly   Complete by: As directed       Allergies as of 01/31/2023       Reactions   Barbiturates Other (See Comments)   A small amount "overdosed" the patient- "affected my stability and balance" (might have been given at  the same as another med?)   Cefdinir Other (See Comments)   A small amount "overdosed" the patient- "affected my stability and balance" (might have been given at the same as another med?)   Codeine Other (See Comments)   "Moderate," per pharmacy   Phenobarbital Other (See Comments)   A small amount "overdosed" the patient- "affected my stability and balance" (might have been given at the same as another med?)   Tegretol [carbamazepine] Other (See Comments)   A small amount "overdosed" the patient- "affected my stability and balance" (might have been given at the same as another med?)   Tramadol Other (See Comments)   "Moderate," per pharmacy   Zonisamide Other (See Comments)   "Negative reaction" (Zonegran)        Medication List     TAKE these medications    ammonium lactate 12 % lotion Commonly known as: LAC-HYDRIN Apply 1  Application topically at bedtime as needed for dry skin (avoid face).   amoxicillin-clavulanate 875-125 MG tablet Commonly known as: AUGMENTIN Take 1 tablet by mouth 2 (two) times daily for 4 days.   guaiFENesin 600 MG 12 hr tablet Commonly known as: Mucinex Take 1 tablet (600 mg total) by mouth 2 (two) times daily for 15 days.   ketoconazole 2 % shampoo Commonly known as: NIZORAL Apply 1 application topically once a week.   lisinopril 20 MG tablet Commonly known as: ZESTRIL Take 10 mg by mouth daily.   One-A-Day Womens 50 Plus Tabs Take 1 tablet by mouth daily with breakfast.   PreserVision/Lutein Caps Take 2 capsules by mouth daily with breakfast.   Systane 0.4-0.3 % Soln Generic drug: Polyethyl Glycol-Propyl Glycol Place 1-2 drops into both eyes 3 (three) times daily as needed (for dryness).   Vitamin D-3 25 MCG (1000 UT) Caps TAKE 1 CAPSULE DAILY WITH BREAKFAST. What changed: See the new instructions.        Follow-up Information     Mast, Man X, NP Follow up in 1 week(s).   Specialty: Internal Medicine Contact information: [address] [place] [address] [phone]                  Time coordinating discharge: 39 minutes  Signed:  Felicha Frayne  [facility] 01/31/2023, 12:52 PM

## 2023-01-31 NOTE — TOC Progression Note (Addendum)
Transition of Care (TOC) - Progression Note   Patient from independent living at [facility], currently receiving home health through [facility] and would like to continue. PT recommending HHPT. Called [facility] yesterday left a message for [place] . Have not received a call back . Called [facility] again today spoke to August again , was transferred to [employer] of Marylene Land , Network engineer . NCM left another message with direct call back phone number    1230 Called [facility] again was transferred to Barnett Applebaum DON, home health orders faxed to her at [phone] Patient Details  Name: Katherine Obrien MRN: [id number] Date of Birth: [birth year]  Transition of Care [facility]) CM/SW Contact  Yulisa Chirico, Adria Devon, RN Phone Number: 01/31/2023, 10:47 AM  Clinical Narrative:       Expected Discharge Plan: Home w Home Health Services    Expected Discharge Plan and Services   Discharge Planning Services: CM Consult Post Acute Care Choice: Home Health Living arrangements for the past 2 months: Apartment ([facility]) Expected Discharge Date: 01/31/23               DME Arranged: N/A DME Agency: NA       HH Arranged: PT           Social Determinants of Health (SDOH) Interventions SDOH Screenings   Food Insecurity: No Food Insecurity (01/28/2023)  Housing: Low Risk  (01/28/2023)  Transportation Needs: No Transportation Needs (01/28/2023)  Utilities: Not At Risk (01/28/2023)  Depression (PHQ2-9): Low Risk  (11/17/2022)  Tobacco Use: Low Risk  (01/27/2023)    Readmission Risk Interventions     No data to display

## 2023-01-31 NOTE — Progress Notes (Signed)
  Echocardiogram 2D Echocardiogram has been performed.  Katherine Obrien 01/31/2023, 9:46 AM

## 2023-02-01 DIAGNOSIS — R262 Difficulty in walking, not elsewhere classified: Secondary | ICD-10-CM | POA: Diagnosis not present

## 2023-02-01 DIAGNOSIS — M6281 Muscle weakness (generalized): Secondary | ICD-10-CM | POA: Diagnosis not present

## 2023-02-01 DIAGNOSIS — R278 Other lack of coordination: Secondary | ICD-10-CM | POA: Diagnosis not present

## 2023-02-02 ENCOUNTER — Ambulatory Visit: Payer: Medicare Other | Admitting: Nurse Practitioner

## 2023-02-02 ENCOUNTER — Other Ambulatory Visit: Payer: Self-pay | Admitting: Nurse Practitioner

## 2023-02-02 ENCOUNTER — Telehealth: Payer: Self-pay

## 2023-02-02 ENCOUNTER — Encounter: Payer: Self-pay | Admitting: Nurse Practitioner

## 2023-02-02 VITALS — BP 150/78 | HR 88 | Temp 98.0°F | Resp 18 | Ht 63.0 in | Wt 207.0 lb

## 2023-02-02 DIAGNOSIS — E785 Hyperlipidemia, unspecified: Secondary | ICD-10-CM | POA: Diagnosis not present

## 2023-02-02 DIAGNOSIS — R4020 Unspecified coma: Secondary | ICD-10-CM

## 2023-02-02 DIAGNOSIS — R7303 Prediabetes: Secondary | ICD-10-CM | POA: Diagnosis not present

## 2023-02-02 DIAGNOSIS — I10 Essential (primary) hypertension: Secondary | ICD-10-CM | POA: Diagnosis not present

## 2023-02-02 DIAGNOSIS — G40909 Epilepsy, unspecified, not intractable, without status epilepticus: Secondary | ICD-10-CM

## 2023-02-02 DIAGNOSIS — J690 Pneumonitis due to inhalation of food and vomit: Secondary | ICD-10-CM | POA: Diagnosis not present

## 2023-02-02 DIAGNOSIS — R9389 Abnormal findings on diagnostic imaging of other specified body structures: Secondary | ICD-10-CM

## 2023-02-02 DIAGNOSIS — N6320 Unspecified lump in the left breast, unspecified quadrant: Secondary | ICD-10-CM

## 2023-02-02 MED ORDER — LISINOPRIL 20 MG PO TABS: 20.0000 mg | ORAL_TABLET | Freq: Every day | ORAL | 3 refills | Status: DC

## 2023-02-02 MED ORDER — LISINOPRIL 20 MG PO TABS: 10.0000 mg | ORAL_TABLET | Freq: Every day | ORAL | 1 refills | Status: DC

## 2023-02-02 NOTE — Assessment & Plan Note (Signed)
EEG w/o seizure like activities, no antiepileptics recommended by Neurology

## 2023-02-02 NOTE — Progress Notes (Signed)
Location:   [facility]   Place of Service:    Provider: Chipper Oman NP  Code Status: DNR Goals of Care:     02/02/2023    8:37 AM  Advanced Directives  Does Patient Have a Medical Advance Directive? Yes  Type of Advance Directive Healthcare Power of Attorney  Does patient want to make changes to medical advance directive? No - Patient declined  Copy of Healthcare Power of Attorney in Chart? No - copy requested     Chief Complaint  Patient presents with   Follow-up    Patient is here for follow up after hospital stay for pneumonia and blood pressure At [facility]     HPI: Patient is a 82 y.o. female seen today for hospital follow-up, in hospital 01/27/23-01/31/23 for seizure,EEG w/o seizure like activities, no antiepileptics recommended by Neurology,  LOC, CT head no acute process, more concerned of syncope etiology, labs one week, aspiration PNA-fully treated,  f/u CXR 6 weeks, TSH 4.566, f/u Cardiology Zio patch, CT abd 4cm hypodense lesion left lobe of the liver, lesion from the left kidney, recommended [place]  Seizure, ED eval 09/11/23 for unresponsive 10-15 minutes, bit her tongue, off Depakote, saw neurology   HTN, elevated Bp, asymptomatic, f/u             Prediabetes Hgb A1c 6.4 10/04/22 Hx of hyperlipidemia, LDL 125 10/04/22,  off Atorvastatin The patient stated she sleeps better, bathroom trip only 1x/night.  CKD Bun/creat 15/1.12 01/31/23 11/22/22 Mammogram: Further evaluation is suggested for a possible mass in the left breast.  Past Medical History:  Diagnosis Date   Arthritis    Cancer [facility])    skin   Cataract 2010   Dr. Luretha Rued, Dr.. Romie Minus @ [facility]   Epilepsy [facility]) 08/11/2015   Hyperlipidemia 12/08/2015   Hypertension    Polyp of colon 08/11/2015    Past Surgical History:  Procedure Laterality Date   ABDOMINAL HYSTERECTOMY  1970   APPENDECTOMY  1958   BREAST SURGERY  1984   COLON SURGERY  09/12/2006   FEMUR SURGERY  2018   Broken   TONSILLECTOMY  09/12/1944     Allergies  Allergen Reactions   Barbiturates Other (See Comments)    A small amount "overdosed" the patient- "affected my stability and balance" (might have been given at the same as another med?)   Cefdinir Other (See Comments)    A small amount "overdosed" the patient- "affected my stability and balance" (might have been given at the same as another med?)   Codeine Other (See Comments)    "Moderate," per pharmacy   Phenobarbital Other (See Comments)    A small amount "overdosed" the patient- "affected my stability and balance" (might have been given at the same as another med?)   Tegretol [Carbamazepine] Other (See Comments)    A small amount "overdosed" the patient- "affected my stability and balance" (might have been given at the same as another med?)   Tramadol Other (See Comments)    "Moderate," per pharmacy   Zonisamide Other (See Comments)    "Negative reaction" (Zonegran)    Allergies as of 02/02/2023       Reactions   Barbiturates Other (See Comments)   A small amount "overdosed" the patient- "affected my stability and balance" (might have been given at the same as another med?)   Cefdinir Other (See Comments)   A small amount "overdosed" the patient- "affected my stability and balance" (might have been given at the  same as another med?)   Codeine Other (See Comments)   "Moderate," per pharmacy   Phenobarbital Other (See Comments)   A small amount "overdosed" the patient- "affected my stability and balance" (might have been given at the same as another med?)   Tegretol [carbamazepine] Other (See Comments)   A small amount "overdosed" the patient- "affected my stability and balance" (might have been given at the same as another med?)   Tramadol Other (See Comments)   "Moderate," per pharmacy   Zonisamide Other (See Comments)   "Negative reaction" (Zonegran)        Medication List        Accurate as of Feb 02, 2023  3:57 PM. If you have any questions, ask your  nurse or doctor.          ammonium lactate 12 % lotion Commonly known as: LAC-HYDRIN Apply 1 Application topically at bedtime as needed for dry skin (avoid face).   amoxicillin-clavulanate 875-125 MG tablet Commonly known as: AUGMENTIN Take 1 tablet by mouth 2 (two) times daily for 4 days.   guaiFENesin 600 MG 12 hr tablet Commonly known as: Mucinex Take 1 tablet (600 mg total) by mouth 2 (two) times daily for 15 days.   ketoconazole 2 % shampoo Commonly known as: NIZORAL Apply 1 application topically once a week.   lisinopril 20 MG tablet Commonly known as: ZESTRIL Take 1 tablet (20 mg total) by mouth daily. What changed: You were already taking a medication with the same name, and this prescription was added. Make sure you understand how and when to take each. Changed by: Kaelen Caughlin X Tayanna Talford, NP   lisinopril 20 MG tablet Commonly known as: ZESTRIL Take 0.5 tablets (10 mg total) by mouth daily. What changed: Another medication with the same name was added. Make sure you understand how and when to take each. Changed by: Annita Ratliff X Kasyn Stouffer, NP   One-A-Day Womens 50 Plus Tabs Take 1 tablet by mouth daily with breakfast.   PreserVision/Lutein Caps Take 2 capsules by mouth daily with breakfast.   Systane 0.4-0.3 % Soln Generic drug: Polyethyl Glycol-Propyl Glycol Place 1-2 drops into both eyes 3 (three) times daily as needed (for dryness).   Vitamin D-3 25 MCG (1000 UT) Caps TAKE 1 CAPSULE DAILY WITH BREAKFAST. What changed: See the new instructions.        Review of Systems:  Review of Systems  Constitutional:  Negative for fatigue, fever and unexpected weight change.  HENT:  Positive for hearing loss. Negative for congestion and voice change.   Respiratory:  Negative for cough, chest tightness, shortness of breath and wheezing.   Cardiovascular:  Positive for leg swelling. Negative for chest pain and palpitations.  Gastrointestinal:  Negative for abdominal distention, abdominal  pain, constipation and diarrhea.  Genitourinary:  Negative for difficulty urinating, dysuria and urgency.  Musculoskeletal:  Positive for arthralgias and gait problem.  Skin:  Negative for color change.  Neurological:  Negative for dizziness, seizures, speech difficulty, weakness and headaches.  Psychiatric/Behavioral:  Negative for behavioral problems and sleep disturbance. The patient is not nervous/anxious.     Health Maintenance  Topic Date Due   COVID-19 Vaccine (4 - 2023-24 season) 09/08/2022   INFLUENZA VACCINE  04/13/2023   Medicare Annual Wellness (AWV)  11/22/2023   DTaP/Tdap/Td (3 - Td or Tdap) 12/21/2025   Pneumonia Vaccine 28+ Years old  Completed   DEXA SCAN  Completed   Zoster Vaccines- Shingrix  Completed   HPV VACCINES  Aged Out    Physical Exam: Vitals:   02/02/23 1343  BP: (!) 150/78  Pulse: 88  Resp: 18  Temp: 98 F (36.7 C)  SpO2: 94%  Weight: 207 lb (93.9 kg)  Height: 5\' 3"  (1.6 m)   Body mass index is 36.67 kg/m. Physical Exam Vitals and nursing note reviewed.  Constitutional:      Appearance: Normal appearance. She is obese.  HENT:     Head: Normocephalic and atraumatic.     Nose: Nose normal.     Mouth/Throat:     Mouth: Mucous membranes are moist.  Eyes:     Extraocular Movements: Extraocular movements intact.     Conjunctiva/sclera: Conjunctivae normal.     Pupils: Pupils are equal, round, and reactive to light.  Cardiovascular:     Rate and Rhythm: Normal rate and regular rhythm.     Heart sounds: No murmur heard. Pulmonary:     Effort: Pulmonary effort is normal.     Breath sounds: No wheezing, rhonchi or rales.  Abdominal:     General: Bowel sounds are normal.     Palpations: Abdomen is soft.     Tenderness: There is no abdominal tenderness.  Musculoskeletal:     Cervical back: Normal range of motion and neck supple.     Right lower leg: Edema present.     Left lower leg: Edema present.     Comments: Trace edema BLE R>L   Skin:    General: Skin is warm and dry.  Neurological:     General: No focal deficit present.     Mental Status: She is alert and oriented to person, place, and time. Mental status is at baseline.     Motor: No weakness.     Coordination: Coordination normal.     Gait: Gait abnormal.  Psychiatric:        Mood and Affect: Mood normal.        Behavior: Behavior normal.        Thought Content: Thought content normal.        Judgment: Judgment normal.     Labs reviewed: Basic Metabolic Panel: Recent Labs    01/27/23 1510 01/28/23 0043 01/29/23 0108 01/30/23 0029 01/31/23 0303  NA  --    < > 138 134* 136  K  --    < > 3.6 3.7 3.2*  CL  --    < > 102 100 101  CO2  --    < > 26 26 26   GLUCOSE  --    < > 133* 101* 134*  BUN  --    < > 18 15 15   CREATININE  --    < > 1.20* 1.03* 1.12*  CALCIUM  --    < > 8.2* 8.2* 8.3*  MG  --   --  1.9 2.0 1.9  TSH 4.566*  --   --   --   --    < > = values in this interval not displayed.   Liver Function Tests: Recent Labs    03/24/22 1710 10/04/22 0734 01/27/23 1510  AST 33 17 22  ALT 21 20 20   ALKPHOS 82  --  70  BILITOT 0.4 0.5 0.2*  PROT 6.9 7.2 7.0  ALBUMIN 3.5  --  3.5   Recent Labs    01/27/23 1510  LIPASE 57*   No results for input(s): "AMMONIA" in the last 8760 hours. CBC: Recent Labs    03/24/22 1710 04/12/22 0745  01/27/23 1353 01/29/23 0108 01/30/23 0029 01/31/23 0303  WBC 14.8* 9.3   < > 23.0* 15.3* 12.7*  NEUTROABS 12.8* 5,413  --   --   --   --   HGB 13.3 13.2   < > 11.9* 11.3* 11.6*  HCT 41.9 40.7   < > 37.6 35.8* 36.0  MCV 89.1 86.8   < > 89.7 89.7 87.2  PLT 241 300   < > 221 201 231   < > = values in this interval not displayed.   Lipid Panel: Recent Labs    10/04/22 0734  CHOL 202*  HDL 52  LDLCALC 125*  TRIG 136  CHOLHDL 3.9   Lab Results  Component Value Date   HGBA1C 6.4 (H) 10/04/2022    Procedures since last visit: EEG adult  Result Date: 01/28/2023 Charlsie Quest, MD      01/28/2023  7:21 AM Patient Name: Katherine Obrien MRN: [id number] Epilepsy Attending: Charlsie Quest Referring Physician/Provider: Tegeler, Canary Brim, MD Date: 01/28/2023 Duration: 23.30 mins Patient history:  82 y.o. female with medical history significant of seizure disorder not on antiepileptics, HTN, CKD3 who presents from [facility] after an unwitness event where she was found to be unconscious. EEG to evaluate for seizure. Level of alertness: Awake, asleep AEDs during EEG study: None Technical aspects: This EEG study was done with scalp electrodes positioned according to the 10-20 International system of electrode placement. Electrical activity was reviewed with band pass filter of 1-70Hz , sensitivity of 7 uV/mm, display speed of 36mm/sec with a 60Hz  notched filter applied as appropriate. EEG data were recorded continuously and digitally stored.  Video monitoring was available and reviewed as appropriate. Description: The posterior dominant rhythm consists of 8-9 Hz activity of moderate voltage (25-35 uV) seen predominantly in posterior head regions, symmetric and reactive to eye opening and eye closing. Sleep was characterized by vertex waves, sleep spindles (12 to 14 Hz), maximal frontocentral region. EEG showed intermittent generalized 3 to 6 Hz theta-delta slowing. Physiologic photic driving was not seen during photic stimulation. Hyperventilation was not performed.   ABNORMALITY - Intermittent slow, generalized IMPRESSION: This study is suggestive of mild diffuse encephalopathy, nonspecific etiology.  No seizures or epileptiform discharges were seen throughout the recording. Charlsie Quest   CT Angio Chest PE W and/or Wo Contrast  Result Date: 01/27/2023 CLINICAL DATA:  Concern for pulmonary embolism. EXAM: CT ANGIOGRAPHY CHEST WITH CONTRAST TECHNIQUE: Multidetector CT imaging of the chest was performed using the standard protocol during bolus administration of  intravenous contrast. Multiplanar CT image reconstructions and MIPs were obtained to evaluate the vascular anatomy. RADIATION DOSE REDUCTION: This exam was performed according to the departmental dose-optimization program which includes automated exposure control, adjustment of the mA and/or kV according to patient size and/or use of iterative reconstruction technique. CONTRAST:  75mL OMNIPAQUE IOHEXOL 350 MG/ML SOLN COMPARISON:  Chest radiograph dated 01/27/2023. FINDINGS: Evaluation of this exam is limited due to respiratory motion artifact as well as due to streak artifact caused by patient's arms. Cardiovascular: There is no cardiomegaly or pericardial effusion. Coronary vascular calcification predominantly involving the LAD. Mild atherosclerotic calcification of the thoracic aorta. No aneurysmal dilatation or dissection. The origins of the great vessels of the aortic arch appear patent. Evaluation of the pulmonary arteries is limited due to factors above. No definite large or central pulmonary artery embolus identified. Mediastinum/Nodes: Top-normal mediastinal lymph nodes measure up to 1 cm in short axis in the prevascular  space. The esophagus is grossly unremarkable. No mediastinal fluid collection. Lungs/Pleura: Bilateral reticulonodular pulmonary opacities, left greater right may represent edema, pneumonia, or combination. Clinical correlation is recommended. No pleural effusion or pneumothorax. The central airways are patent. Upper Abdomen: Indeterminate 4 cm hypodense lesion in the left lobe of the liver. Additional exophytic lesion arising from the left kidney. Further evaluation of the liver and renal lesions with ultrasound on a nonemergent/outpatient basis recommended. Gallstones. Musculoskeletal: Osteopenia with degenerative changes of the spine. No acute osseous pathology. Review of the MIP images confirms the above findings. IMPRESSION: 1. No CT evidence of central pulmonary artery embolus. 2.  Bilateral reticulonodular pulmonary opacities, left greater right may represent edema, pneumonia, or combination. 3.  Aortic Atherosclerosis (ICD10-I70.0). Electronically Signed   By: Elgie Collard M.D.   On: 01/27/2023 19:39   CT HEAD WO CONTRAST ( )  Result Date: 01/27/2023 CLINICAL DATA:  Syncope, hypoxia, unresponsive EXAM: CT HEAD WITHOUT CONTRAST TECHNIQUE: Contiguous axial images were obtained from the base of the skull through the vertex without intravenous contrast. RADIATION DOSE REDUCTION: This exam was performed according to the departmental dose-optimization program which includes automated exposure control, adjustment of the mA and/or kV according to patient size and/or use of iterative reconstruction technique. COMPARISON:  03/24/2022 FINDINGS: Brain: No acute infarct or hemorrhage. Chronic hypodensity right basal ganglia consistent with dilated perivascular space versus previous lacunar infarct, stable. Lateral ventricles and remaining midline structures are unremarkable. No acute extra-axial fluid collections. No mass effect. Vascular: No hyperdense vessel or unexpected calcification. Skull: Normal. Negative for fracture or focal lesion. Sinuses/Orbits: No acute finding. Other: None. IMPRESSION: 1. No acute intracranial process. Electronically Signed   By: Sharlet Salina M.D.   On: 01/27/2023 17:54   DG Chest Portable 1 View  Result Date: 01/27/2023 CLINICAL DATA:  hypoxia, seizure vs syncope with emesis and cough now EXAM: PORTABLE CHEST 1 VIEW COMPARISON:  CXR 03/24/22 FINDINGS: No pleural effusion. No pneumothorax. There is hazy opacity at the left lung base could represent atelectasis, infection, or aspiration. Normal cardiac and mediastinal contours. No radiographically apparent displaced rib fractures. Visualized upper abdomen is unremarkable. Degenerative changes of the left glenohumeral and AC joint. IMPRESSION: Hazy opacity at the left lung base could represent atelectasis,  infection, or aspiration. Electronically Signed   By: Lorenza Cambridge M.D.   On: 01/27/2023 15:04    Assessment/Plan Aspiration pneumonia (HCC) aspiration PNA-fully treated,  f/u CXR 6 weeks  Seizure disorder (HCC) EEG w/o seizure like activities, no antiepileptics recommended by Neurology  Loss of consciousness (HCC)  CT head no acute process, more concerned of syncope etiology, labs one week, f/u Cardiology Zio patch  Abnormal finding on CT scan  CT abd 4cm hypodense lesion left lobe of the liver, lesion from the left kidney, recommended [place]   HTN (hypertension)  elevated Bp, asymptomatic, will start taking Lisinopril 10mg  qd,  f/u cardiology 04/05/23  Prediabetes  Hgb A1c 6.4 10/04/22  Hyperlipidemia  LDL 125 10/04/22,  off Atorvastatin  CKD (chronic kidney disease) stage 3, GFR 30-59 ml/min (HCC) Bun/creat 15/1.12 01/31/23   Left breast mass 11/22/22 Mammogram: Further evaluation is suggested for a possible mass in the left breast. 02/02/23: the patient will [facility] since it was unsuccessful contacting the [facility] from  [facility].     Labs/tests ordered:  CBC/diff, CMP/eGFR, US liver, [place] left kidney, CXR ap/lateral 6 week.   Next appt:  3 months

## 2023-02-02 NOTE — Assessment & Plan Note (Signed)
CT abd 4cm hypodense lesion left lobe of the liver, lesion from the left kidney, recommended [place]

## 2023-02-02 NOTE — Assessment & Plan Note (Addendum)
elevated Bp, asymptomatic, will start taking Lisinopril 10mg  qd,  f/u cardiology 04/05/23

## 2023-02-02 NOTE — Assessment & Plan Note (Signed)
Bun/creat 15/1.12 01/31/23

## 2023-02-02 NOTE — Assessment & Plan Note (Signed)
Hgb A1c 6.4 10/04/22 

## 2023-02-02 NOTE — Assessment & Plan Note (Signed)
aspiration PNA-fully treated,  f/u CXR 6 weeks

## 2023-02-02 NOTE — Assessment & Plan Note (Signed)
11/22/22 Mammogram: Further evaluation is suggested for a possible mass in the left breast. 02/02/23: the patient will contact [facility] since it was unsuccessful contacting the patient from  [facility].

## 2023-02-02 NOTE — Assessment & Plan Note (Signed)
LDL 125 10/04/22,  off Atorvastatin

## 2023-02-02 NOTE — Assessment & Plan Note (Signed)
CT head no acute process, more concerned of syncope etiology, labs one week, f/u Cardiology Zio patch

## 2023-02-02 NOTE — Telephone Encounter (Signed)
Pharmacist called stating they received 2 rx's for lisinopril and need the physician to provide a clarification on which one patient is to take  RX's have 2 different sets of directions

## 2023-02-03 DIAGNOSIS — M6281 Muscle weakness (generalized): Secondary | ICD-10-CM | POA: Diagnosis not present

## 2023-02-03 DIAGNOSIS — R262 Difficulty in walking, not elsewhere classified: Secondary | ICD-10-CM | POA: Diagnosis not present

## 2023-02-03 DIAGNOSIS — R278 Other lack of coordination: Secondary | ICD-10-CM | POA: Diagnosis not present

## 2023-02-03 MED ORDER — LISINOPRIL 10 MG PO TABS: 10.0000 mg | ORAL_TABLET | Freq: Every day | ORAL | 1 refills | Status: DC

## 2023-02-03 NOTE — Telephone Encounter (Signed)
I called the pharmacy and had them cancel both 20 mg rx's that [facility], NP sent over yesterday, with 2 different sets of instructions. I sent over a new rx for the 10 mg tablet, as a reflection of ManXie's response to this message.

## 2023-02-03 NOTE — Telephone Encounter (Signed)
Clarification: Lisinopril 10mg  po daily. Thanks.

## 2023-02-06 DIAGNOSIS — R278 Other lack of coordination: Secondary | ICD-10-CM | POA: Diagnosis not present

## 2023-02-06 DIAGNOSIS — M6281 Muscle weakness (generalized): Secondary | ICD-10-CM | POA: Diagnosis not present

## 2023-02-06 DIAGNOSIS — R262 Difficulty in walking, not elsewhere classified: Secondary | ICD-10-CM | POA: Diagnosis not present

## 2023-02-07 DIAGNOSIS — R9389 Abnormal findings on diagnostic imaging of other specified body structures: Secondary | ICD-10-CM

## 2023-02-07 DIAGNOSIS — J690 Pneumonitis due to inhalation of food and vomit: Secondary | ICD-10-CM

## 2023-02-08 DIAGNOSIS — M6281 Muscle weakness (generalized): Secondary | ICD-10-CM | POA: Diagnosis not present

## 2023-02-08 DIAGNOSIS — R262 Difficulty in walking, not elsewhere classified: Secondary | ICD-10-CM | POA: Diagnosis not present

## 2023-02-08 DIAGNOSIS — R278 Other lack of coordination: Secondary | ICD-10-CM | POA: Diagnosis not present

## 2023-02-09 ENCOUNTER — Encounter: Payer: Medicare Other | Admitting: Nurse Practitioner

## 2023-02-10 ENCOUNTER — Other Ambulatory Visit: Payer: Self-pay

## 2023-02-10 DIAGNOSIS — R262 Difficulty in walking, not elsewhere classified: Secondary | ICD-10-CM | POA: Diagnosis not present

## 2023-02-10 DIAGNOSIS — R55 Syncope and collapse: Secondary | ICD-10-CM

## 2023-02-10 DIAGNOSIS — R928 Other abnormal and inconclusive findings on diagnostic imaging of breast: Secondary | ICD-10-CM

## 2023-02-10 DIAGNOSIS — M6281 Muscle weakness (generalized): Secondary | ICD-10-CM | POA: Diagnosis not present

## 2023-02-10 DIAGNOSIS — R278 Other lack of coordination: Secondary | ICD-10-CM | POA: Diagnosis not present

## 2023-02-13 DIAGNOSIS — R262 Difficulty in walking, not elsewhere classified: Secondary | ICD-10-CM | POA: Diagnosis not present

## 2023-02-13 DIAGNOSIS — R278 Other lack of coordination: Secondary | ICD-10-CM | POA: Diagnosis not present

## 2023-02-13 DIAGNOSIS — M6281 Muscle weakness (generalized): Secondary | ICD-10-CM | POA: Diagnosis not present

## 2023-02-14 DIAGNOSIS — J690 Pneumonitis due to inhalation of food and vomit: Secondary | ICD-10-CM | POA: Diagnosis not present

## 2023-02-14 LAB — CBC WITH DIFFERENTIAL/PLATELET
Absolute Monocytes: 848 cells/uL (ref 200–950)
Basophils Absolute: 92 cells/uL (ref 0–200)
Basophils Relative: 1.1 %
Eosinophils Absolute: 319 cells/uL (ref 15–500)
Eosinophils Relative: 3.8 %
HCT: 38.8 % (ref 35.0–45.0)
Hemoglobin: 12.8 g/dL (ref 11.7–15.5)
Lymphs Abs: 2713 cells/uL (ref 850–3900)
MCH: 28.2 pg (ref 27.0–33.0)
MCHC: 33 g/dL (ref 32.0–36.0)
MCV: 85.5 fL (ref 80.0–100.0)
MPV: 10.1 fL (ref 7.5–12.5)
Monocytes Relative: 10.1 %
Neutro Abs: 4427 cells/uL (ref 1500–7800)
Neutrophils Relative %: 52.7 %
Platelets: 371 10*3/uL (ref 140–400)
RBC: 4.54 10*6/uL (ref 3.80–5.10)
RDW: 13.1 % (ref 11.0–15.0)
Total Lymphocyte: 32.3 %
WBC: 8.4 10*3/uL (ref 3.8–10.8)

## 2023-02-14 LAB — COMPLETE METABOLIC PANEL WITH GFR
AG Ratio: 1.3 (calc) (ref 1.0–2.5)
ALT: 20 U/L (ref 6–29)
AST: 16 U/L (ref 10–35)
Albumin: 3.9 g/dL (ref 3.6–5.1)
Alkaline phosphatase (APISO): 84 U/L (ref 37–153)
BUN/Creatinine Ratio: 24 (calc) — ABNORMAL HIGH (ref 6–22)
BUN: 23 mg/dL (ref 7–25)
CO2: 27 mmol/L (ref 20–32)
Calcium: 9.7 mg/dL (ref 8.6–10.4)
Chloride: 102 mmol/L (ref 98–110)
Creat: 0.96 mg/dL — ABNORMAL HIGH (ref 0.60–0.95)
Globulin: 3.1 g/dL (calc) (ref 1.9–3.7)
Glucose, Bld: 102 mg/dL — ABNORMAL HIGH (ref 65–99)
Potassium: 4.4 mmol/L (ref 3.5–5.3)
Sodium: 138 mmol/L (ref 135–146)
Total Bilirubin: 0.5 mg/dL (ref 0.2–1.2)
Total Protein: 7 g/dL (ref 6.1–8.1)
eGFR: 59 mL/min/{1.73_m2} — ABNORMAL LOW (ref >=60)

## 2023-02-15 ENCOUNTER — Ambulatory Visit
Admission: RE | Admit: 2023-02-15 | Discharge: 2023-02-15 | Disposition: A | Discharge status: Home / Self Care | Payer: Medicare Other | Source: Ambulatory Visit | Attending: Nurse Practitioner | Admitting: Nurse Practitioner

## 2023-02-15 DIAGNOSIS — M6281 Muscle weakness (generalized): Secondary | ICD-10-CM | POA: Diagnosis not present

## 2023-02-15 DIAGNOSIS — R278 Other lack of coordination: Secondary | ICD-10-CM | POA: Diagnosis not present

## 2023-02-15 DIAGNOSIS — R262 Difficulty in walking, not elsewhere classified: Secondary | ICD-10-CM | POA: Diagnosis not present

## 2023-02-15 DIAGNOSIS — N281 Cyst of kidney, acquired: Secondary | ICD-10-CM | POA: Diagnosis not present

## 2023-02-16 ENCOUNTER — Ambulatory Visit
Admission: RE | Admit: 2023-02-16 | Discharge: 2023-02-16 | Disposition: A | Discharge status: Home / Self Care | Payer: Medicare Other | Source: Ambulatory Visit | Attending: Nurse Practitioner | Admitting: Nurse Practitioner

## 2023-02-16 ENCOUNTER — Encounter: Payer: Self-pay | Admitting: Nurse Practitioner

## 2023-02-16 DIAGNOSIS — N6002 Solitary cyst of left breast: Secondary | ICD-10-CM | POA: Diagnosis not present

## 2023-02-16 DIAGNOSIS — N6042 Mammary duct ectasia of left breast: Secondary | ICD-10-CM | POA: Diagnosis not present

## 2023-02-16 DIAGNOSIS — R928 Other abnormal and inconclusive findings on diagnostic imaging of breast: Secondary | ICD-10-CM

## 2023-02-16 DIAGNOSIS — K760 Fatty (change of) liver, not elsewhere classified: Secondary | ICD-10-CM | POA: Insufficient documentation

## 2023-02-16 DIAGNOSIS — R922 Inconclusive mammogram: Secondary | ICD-10-CM | POA: Diagnosis not present

## 2023-02-17 DIAGNOSIS — R262 Difficulty in walking, not elsewhere classified: Secondary | ICD-10-CM | POA: Diagnosis not present

## 2023-02-17 DIAGNOSIS — R278 Other lack of coordination: Secondary | ICD-10-CM | POA: Diagnosis not present

## 2023-02-17 DIAGNOSIS — M6281 Muscle weakness (generalized): Secondary | ICD-10-CM | POA: Diagnosis not present

## 2023-02-22 DIAGNOSIS — R262 Difficulty in walking, not elsewhere classified: Secondary | ICD-10-CM | POA: Diagnosis not present

## 2023-02-22 DIAGNOSIS — M6281 Muscle weakness (generalized): Secondary | ICD-10-CM | POA: Diagnosis not present

## 2023-02-22 DIAGNOSIS — R278 Other lack of coordination: Secondary | ICD-10-CM | POA: Diagnosis not present

## 2023-02-24 DIAGNOSIS — R278 Other lack of coordination: Secondary | ICD-10-CM | POA: Diagnosis not present

## 2023-02-24 DIAGNOSIS — R262 Difficulty in walking, not elsewhere classified: Secondary | ICD-10-CM | POA: Diagnosis not present

## 2023-02-24 DIAGNOSIS — M6281 Muscle weakness (generalized): Secondary | ICD-10-CM | POA: Diagnosis not present

## 2023-02-27 DIAGNOSIS — R278 Other lack of coordination: Secondary | ICD-10-CM | POA: Diagnosis not present

## 2023-02-27 DIAGNOSIS — R262 Difficulty in walking, not elsewhere classified: Secondary | ICD-10-CM | POA: Diagnosis not present

## 2023-02-27 DIAGNOSIS — M6281 Muscle weakness (generalized): Secondary | ICD-10-CM | POA: Diagnosis not present

## 2023-03-03 DIAGNOSIS — R278 Other lack of coordination: Secondary | ICD-10-CM | POA: Diagnosis not present

## 2023-03-03 DIAGNOSIS — M6281 Muscle weakness (generalized): Secondary | ICD-10-CM | POA: Diagnosis not present

## 2023-03-03 DIAGNOSIS — R262 Difficulty in walking, not elsewhere classified: Secondary | ICD-10-CM | POA: Diagnosis not present

## 2023-03-06 DIAGNOSIS — R278 Other lack of coordination: Secondary | ICD-10-CM | POA: Diagnosis not present

## 2023-03-06 DIAGNOSIS — M6281 Muscle weakness (generalized): Secondary | ICD-10-CM | POA: Diagnosis not present

## 2023-03-06 DIAGNOSIS — R262 Difficulty in walking, not elsewhere classified: Secondary | ICD-10-CM | POA: Diagnosis not present

## 2023-03-07 ENCOUNTER — Telehealth: Payer: Self-pay | Admitting: Internal Medicine

## 2023-03-07 NOTE — Telephone Encounter (Signed)
Patient contacted. Patient had been provide instructions for a [employer] monitor that has had some changes since document created.  Instructions stated monitor was from [employer].  The company was bought out by [employer] which is the new name listed on their product.  The return box is also now white instead of blue, but still has a prepaid [employer] shipping label on the back of the box.  Confirmed changes and assured patient monitor was the same but in a different package. Her end of service date is 03/11/23 and results will be forwarded to Dr. Izora Ribas to review.

## 2023-03-07 NOTE — Telephone Encounter (Signed)
Patient states she received her heart monitor in the mail, but the instructions do not match up with what she received. She would also like to discuss instructions for sending the monitor back. Please advise.

## 2023-03-07 NOTE — Telephone Encounter (Signed)
No answer, voicemail not set up. 

## 2023-03-08 DIAGNOSIS — R262 Difficulty in walking, not elsewhere classified: Secondary | ICD-10-CM | POA: Diagnosis not present

## 2023-03-08 DIAGNOSIS — M6281 Muscle weakness (generalized): Secondary | ICD-10-CM | POA: Diagnosis not present

## 2023-03-08 DIAGNOSIS — R278 Other lack of coordination: Secondary | ICD-10-CM | POA: Diagnosis not present

## 2023-03-10 ENCOUNTER — Emergency Department (HOSPITAL_COMMUNITY): Payer: Medicare Other

## 2023-03-10 ENCOUNTER — Other Ambulatory Visit: Payer: Self-pay

## 2023-03-10 ENCOUNTER — Inpatient Hospital Stay (HOSPITAL_COMMUNITY)
Admission: EM | Admit: 2023-03-10 | Discharge: 2023-03-13 | DRG: 100 | Disposition: A | Discharge status: Home / Self Care | Payer: Medicare Other | Attending: Internal Medicine | Admitting: Internal Medicine

## 2023-03-10 DIAGNOSIS — I6782 Cerebral ischemia: Secondary | ICD-10-CM | POA: Diagnosis not present

## 2023-03-10 DIAGNOSIS — Z833 Family history of diabetes mellitus: Secondary | ICD-10-CM

## 2023-03-10 DIAGNOSIS — R739 Hyperglycemia, unspecified: Secondary | ICD-10-CM | POA: Diagnosis present

## 2023-03-10 DIAGNOSIS — R7303 Prediabetes: Secondary | ICD-10-CM

## 2023-03-10 DIAGNOSIS — I10 Essential (primary) hypertension: Secondary | ICD-10-CM | POA: Diagnosis not present

## 2023-03-10 DIAGNOSIS — Z6834 Body mass index (BMI) 34.0-34.9, adult: Secondary | ICD-10-CM

## 2023-03-10 DIAGNOSIS — R404 Transient alteration of awareness: Secondary | ICD-10-CM | POA: Diagnosis not present

## 2023-03-10 DIAGNOSIS — J9601 Acute respiratory failure with hypoxia: Secondary | ICD-10-CM | POA: Diagnosis present

## 2023-03-10 DIAGNOSIS — R55 Syncope and collapse: Secondary | ICD-10-CM | POA: Diagnosis present

## 2023-03-10 DIAGNOSIS — R0902 Hypoxemia: Secondary | ICD-10-CM

## 2023-03-10 DIAGNOSIS — M6281 Muscle weakness (generalized): Secondary | ICD-10-CM | POA: Diagnosis not present

## 2023-03-10 DIAGNOSIS — I129 Hypertensive chronic kidney disease with stage 1 through stage 4 chronic kidney disease, or unspecified chronic kidney disease: Secondary | ICD-10-CM | POA: Diagnosis present

## 2023-03-10 DIAGNOSIS — R000 Tachycardia, unspecified: Secondary | ICD-10-CM | POA: Diagnosis not present

## 2023-03-10 DIAGNOSIS — Z888 Allergy status to other drugs, medicaments and biological substances status: Secondary | ICD-10-CM | POA: Diagnosis not present

## 2023-03-10 DIAGNOSIS — R278 Other lack of coordination: Secondary | ICD-10-CM | POA: Diagnosis not present

## 2023-03-10 DIAGNOSIS — J984 Other disorders of lung: Secondary | ICD-10-CM | POA: Diagnosis not present

## 2023-03-10 DIAGNOSIS — Z8249 Family history of ischemic heart disease and other diseases of the circulatory system: Secondary | ICD-10-CM | POA: Diagnosis not present

## 2023-03-10 DIAGNOSIS — D72829 Elevated white blood cell count, unspecified: Secondary | ICD-10-CM | POA: Diagnosis present

## 2023-03-10 DIAGNOSIS — Z881 Allergy status to other antibiotic agents status: Secondary | ICD-10-CM

## 2023-03-10 DIAGNOSIS — E785 Hyperlipidemia, unspecified: Secondary | ICD-10-CM | POA: Diagnosis not present

## 2023-03-10 DIAGNOSIS — M1990 Unspecified osteoarthritis, unspecified site: Secondary | ICD-10-CM | POA: Diagnosis present

## 2023-03-10 DIAGNOSIS — Z885 Allergy status to narcotic agent status: Secondary | ICD-10-CM

## 2023-03-10 DIAGNOSIS — Z8261 Family history of arthritis: Secondary | ICD-10-CM | POA: Diagnosis not present

## 2023-03-10 DIAGNOSIS — R918 Other nonspecific abnormal finding of lung field: Secondary | ICD-10-CM | POA: Diagnosis not present

## 2023-03-10 DIAGNOSIS — Z79899 Other long term (current) drug therapy: Secondary | ICD-10-CM

## 2023-03-10 DIAGNOSIS — Z90710 Acquired absence of both cervix and uterus: Secondary | ICD-10-CM | POA: Diagnosis not present

## 2023-03-10 DIAGNOSIS — Z85828 Personal history of other malignant neoplasm of skin: Secondary | ICD-10-CM

## 2023-03-10 DIAGNOSIS — R059 Cough, unspecified: Secondary | ICD-10-CM | POA: Diagnosis not present

## 2023-03-10 DIAGNOSIS — Z9049 Acquired absence of other specified parts of digestive tract: Secondary | ICD-10-CM | POA: Diagnosis not present

## 2023-03-10 DIAGNOSIS — Z86010 Personal history of colonic polyps: Secondary | ICD-10-CM

## 2023-03-10 DIAGNOSIS — R262 Difficulty in walking, not elsewhere classified: Secondary | ICD-10-CM | POA: Diagnosis not present

## 2023-03-10 DIAGNOSIS — N1831 Chronic kidney disease, stage 3a: Secondary | ICD-10-CM | POA: Diagnosis present

## 2023-03-10 DIAGNOSIS — J189 Pneumonia, unspecified organism: Secondary | ICD-10-CM | POA: Diagnosis not present

## 2023-03-10 DIAGNOSIS — R569 Unspecified convulsions: Secondary | ICD-10-CM | POA: Diagnosis not present

## 2023-03-10 DIAGNOSIS — E669 Obesity, unspecified: Secondary | ICD-10-CM | POA: Diagnosis present

## 2023-03-10 DIAGNOSIS — I517 Cardiomegaly: Secondary | ICD-10-CM | POA: Diagnosis not present

## 2023-03-10 DIAGNOSIS — G40909 Epilepsy, unspecified, not intractable, without status epilepticus: Principal | ICD-10-CM

## 2023-03-10 DIAGNOSIS — R051 Acute cough: Secondary | ICD-10-CM

## 2023-03-10 LAB — CBC WITH DIFFERENTIAL/PLATELET
Abs Immature Granulocytes: 0.04 10*3/uL (ref 0.00–0.07)
Basophils Absolute: 0 10*3/uL (ref 0.0–0.1)
Basophils Relative: 0 %
Eosinophils Absolute: 0.2 10*3/uL (ref 0.0–0.5)
Eosinophils Relative: 2 %
HCT: 40.4 % (ref 36.0–46.0)
Hemoglobin: 12.7 g/dL (ref 12.0–15.0)
Immature Granulocytes: 0 %
Lymphocytes Relative: 17 %
Lymphs Abs: 2.1 10*3/uL (ref 0.7–4.0)
MCH: 28 pg (ref 26.0–34.0)
MCHC: 31.4 g/dL (ref 30.0–36.0)
MCV: 89.2 fL (ref 80.0–100.0)
Monocytes Absolute: 1 10*3/uL (ref 0.1–1.0)
Monocytes Relative: 8 %
Neutro Abs: 8.7 10*3/uL — ABNORMAL HIGH (ref 1.7–7.7)
Neutrophils Relative %: 73 %
Platelets: 266 10*3/uL (ref 150–400)
RBC: 4.53 MIL/uL (ref 3.87–5.11)
RDW: 15.3 % (ref 11.5–15.5)
WBC: 12 10*3/uL — ABNORMAL HIGH (ref 4.0–10.5)
nRBC: 0 % (ref 0.0–0.2)

## 2023-03-10 LAB — AMMONIA: Ammonia: 15 umol/L (ref 9–35)

## 2023-03-10 LAB — TROPONIN I (HIGH SENSITIVITY)
Troponin I (High Sensitivity): 6 ng/L (ref <18)
Troponin I (High Sensitivity): 6 ng/L (ref <18)

## 2023-03-10 LAB — TSH: TSH: 5.269 u[IU]/mL — ABNORMAL HIGH (ref 0.350–4.500)

## 2023-03-10 LAB — COMPREHENSIVE METABOLIC PANEL
ALT: 17 U/L (ref 0–44)
AST: 24 U/L (ref 15–41)
Albumin: 3.6 g/dL (ref 3.5–5.0)
Alkaline Phosphatase: 89 U/L (ref 38–126)
Anion gap: 16 — ABNORMAL HIGH (ref 5–15)
BUN: 17 mg/dL (ref 8–23)
CO2: 25 mmol/L (ref 22–32)
Calcium: 9.8 mg/dL (ref 8.9–10.3)
Chloride: 97 mmol/L — ABNORMAL LOW (ref 98–111)
Creatinine, Ser: 1.09 mg/dL — ABNORMAL HIGH (ref 0.44–1.00)
GFR, Estimated: 50 mL/min — ABNORMAL LOW (ref >60)
Glucose, Bld: 139 mg/dL — ABNORMAL HIGH (ref 70–99)
Potassium: 4.1 mmol/L (ref 3.5–5.1)
Sodium: 138 mmol/L (ref 135–145)
Total Bilirubin: 0.3 mg/dL (ref 0.3–1.2)
Total Protein: 7.3 g/dL (ref 6.5–8.1)

## 2023-03-10 LAB — MAGNESIUM: Magnesium: 2.1 mg/dL (ref 1.7–2.4)

## 2023-03-10 MED ORDER — LORAZEPAM 2 MG/ML IJ SOLN: 2.0000 mg | Freq: Four times a day (QID) | INTRAMUSCULAR | Status: DC | PRN

## 2023-03-10 MED ORDER — LACTATED RINGERS IV SOLN: INTRAVENOUS | Status: AC

## 2023-03-10 MED ORDER — ENOXAPARIN SODIUM 40 MG/0.4ML IJ SOSY
40.0000 mg | PREFILLED_SYRINGE | INTRAMUSCULAR | Status: DC
  Administered 2023-03-11 – 2023-03-13 (×3): 40 mg via SUBCUTANEOUS
  Filled 2023-03-10 (×3): qty 0.4

## 2023-03-10 NOTE — ED Provider Notes (Signed)
[place] EMERGENCY DEPARTMENT AT [facility] Provider Note   CSN: [id number] Arrival date & time: 03/10/23  1839     History {Add pertinent medical, surgical, social history, OB history to HPI:1} No chief complaint on file.   Katherine Obrien is a 82 y.o. female.  The history is provided by the patient, the EMS personnel and medical records. No language interpreter was used.  Seizures Seizure activity on arrival: no   Seizure type:  Unable to specify Preceding symptoms: no headache   Episode characteristics: incontinence and unresponsiveness   Postictal symptoms: confusion   Return to baseline: yes   Severity:  Unable to specify Duration:  1 minute Timing:  Once Context: not alcohol withdrawal   Recent head injury:  No recent head injuries PTA treatment:  None History of seizures: yes        Home Medications Prior to Admission medications   Medication Sig Start Date End Date Taking? Authorizing Provider  ammonium lactate (LAC-HYDRIN) 12 % lotion Apply 1 Application topically at bedtime as needed for dry skin (avoid face). Patient not taking: Reported on 01/29/2023    [provider]  Cholecalciferol (VITAMIN D-3) 25 MCG (1000 UT) CAPS TAKE 1 CAPSULE DAILY WITH BREAKFAST. Patient taking differently: Take 1,000 Units by mouth daily with breakfast. 09/24/19   Mast, Man X, NP  ketoconazole (NIZORAL) 2 % shampoo Apply 1 application topically once a week.  04/23/19   [provider]  lisinopril (ZESTRIL) 10 MG tablet Take 1 tablet (10 mg total) by mouth daily. 02/03/23   Mast, Man X, NP  Multiple Vitamins-Minerals (ONE-A-DAY WOMENS 50 PLUS) TABS Take 1 tablet by mouth daily with breakfast.    [provider]  Multiple Vitamins-Minerals (PRESERVISION/LUTEIN) CAPS Take 2 capsules by mouth daily with breakfast.    [provider]  Polyethyl Glycol-Propyl Glycol (SYSTANE) 0.4-0.3 % SOLN Place 1-2 drops into both eyes 3 (three) times  daily as needed (for dryness).     [provider]      Allergies    Barbiturates, Cefdinir, Codeine, Phenobarbital, Tegretol [carbamazepine], Tramadol, and Zonisamide    Review of Systems   Review of Systems  Constitutional:  Negative for chills, fatigue and fever.  HENT:  Negative for congestion.   Eyes:  Negative for visual disturbance.  Respiratory:  Negative for cough, chest tightness, shortness of breath and wheezing.   Cardiovascular:  Negative for chest pain and palpitations.  Gastrointestinal:  Negative for abdominal pain, constipation, diarrhea, nausea and vomiting.  Genitourinary:  Negative for dysuria.  Musculoskeletal:  Negative for back pain.  Skin:  Negative for rash.  Neurological:  Positive for seizures. Negative for light-headedness and headaches.  Psychiatric/Behavioral:  Negative for agitation.   All other systems reviewed and are negative.   Physical Exam Updated Vital Signs BP (!) 145/79   Pulse 84   Resp 15   Ht 5\' 4"  (1.626 m)   Wt 90.7 kg   SpO2 95%   BMI 34.33 kg/m  Physical Exam Vitals and nursing note reviewed.  Constitutional:      General: She is not in acute distress.    Appearance: She is well-developed. She is not ill-appearing, toxic-appearing or diaphoretic.  HENT:     Head: Normocephalic and atraumatic.     Nose: Nose normal.     Mouth/Throat:     Mouth: Mucous membranes are moist.  Eyes:     Conjunctiva/sclera: Conjunctivae normal.     Pupils: Pupils are equal,  round, and reactive to light.  Cardiovascular:     Rate and Rhythm: Normal rate and regular rhythm.     Heart sounds: No murmur heard. Pulmonary:     Effort: Pulmonary effort is normal. No respiratory distress.     Breath sounds: Normal breath sounds. No wheezing, rhonchi or rales.  Chest:     Chest wall: No tenderness.  Abdominal:     General: Abdomen is flat.     Palpations: Abdomen is soft.     Tenderness: There is no abdominal tenderness. There is no  right CVA tenderness, left CVA tenderness, guarding or rebound.  Musculoskeletal:        General: No swelling or tenderness.     Cervical back: Neck supple.     Right lower leg: No edema.     Left lower leg: No edema.  Skin:    General: Skin is warm and dry.     Capillary Refill: Capillary refill takes less than 2 seconds.     Findings: No erythema or rash.  Neurological:     General: No focal deficit present.     Mental Status: She is alert.     Cranial Nerves: No cranial nerve deficit.     Sensory: No sensory deficit.     Motor: No weakness.     Coordination: Coordination normal.  Psychiatric:        Mood and Affect: Mood normal.     ED Results / Procedures / Treatments   Labs (all labs ordered are listed, but only abnormal results are displayed) Labs Reviewed  CBC WITH DIFFERENTIAL/PLATELET - Abnormal; Notable for the following components:      Result Value   WBC 12.0 (*)    Neutro Abs 8.7 (*)    All other components within normal limits  COMPREHENSIVE METABOLIC PANEL - Abnormal; Notable for the following components:   Chloride 97 (*)    Glucose, Bld 139 (*)    Creatinine, Ser 1.09 (*)    GFR, Estimated 50 (*)    Anion gap 16 (*)    All other components within normal limits  TSH - Abnormal; Notable for the following components:   TSH 5.269 (*)    All other components within normal limits  MAGNESIUM  AMMONIA  URINALYSIS, ROUTINE W REFLEX MICROSCOPIC  CBG MONITORING, ED  TROPONIN I (HIGH SENSITIVITY)  TROPONIN I (HIGH SENSITIVITY)    EKG EKG Interpretation Date/Time:  Friday March 10 2023 21:18:01 EDT Ventricular Rate:  82 PR Interval:  179 QRS Duration:  87 QT Interval:  369 QTC Calculation: 431 R Axis:   63  Text Interpretation: Sinus rhythm Consider left atrial enlargement RSR' in V1 or V2, right VCD or RVH when compared to prior, overall similar appearance with slower rate. No STEMI Confirmed by Theda Belfast ([id number]) on 03/10/2023 10:04:34  PM  Radiology CT HEAD WO CONTRAST ( )  Result Date: 03/10/2023 CLINICAL DATA:  Syncope/presyncope EXAM: CT HEAD WITHOUT CONTRAST TECHNIQUE: Contiguous axial images were obtained from the base of the skull through the vertex without intravenous contrast. RADIATION DOSE REDUCTION: This exam was performed according to the departmental dose-optimization program which includes automated exposure control, adjustment of the mA and/or kV according to patient size and/or use of iterative reconstruction technique. COMPARISON:  01/27/2023 FINDINGS: Brain: No evidence of acute infarction, hemorrhage, hydrocephalus, extra-axial collection or mass lesion/mass effect. Subcortical white matter and periventricular small vessel ischemic changes. Old right basal ganglia lacunar infarct. Vascular: No hyperdense vessel or unexpected calcification.  Skull: Normal. Negative for fracture or focal lesion. Sinuses/Orbits: The visualized paranasal sinuses are essentially clear. The mastoid air cells are unopacified. Other: None. IMPRESSION: No acute intracranial abnormality. Small vessel ischemic changes. Old right basal ganglia lacunar infarct. Electronically Signed   By: Charline Bills M.D.   On: 03/10/2023 19:45   DG Chest Portable 1 View  Result Date: 03/10/2023 CLINICAL DATA:  Seizures, evaluate for aspiration EXAM: PORTABLE CHEST 1 VIEW COMPARISON:  Previous studies including the examination of 01/27/2023 FINDINGS: Transverse diameter of heart is increased. There are no signs of pulmonary edema or focal pulmonary consolidation. Small linear densities in left lower lung field may suggest scarring or subsegmental atelectasis. There is no pleural effusion or pneumothorax. Degenerative changes are noted in the left shoulder. IMPRESSION: Cardiomegaly. There are no signs of pulmonary edema or new focal pulmonary consolidation. Small linear densities in the lateral aspect of left lower lung field may suggest scarring or  subsegmental atelectasis. Electronically Signed   By: Ernie Avena M.D.   On: 03/10/2023 19:27    Procedures Procedures  {Document cardiac monitor, telemetry assessment procedure when appropriate:1}  Medications Ordered in ED Medications - No data to display  ED Course/ Medical Decision Making/ A&P   {   Click here for ABCD2, HEART and other calculatorsREFRESH Note before signing :1}                          Medical Decision Making Amount and/or Complexity of Data Reviewed Labs: ordered. Radiology: ordered.  Risk Decision regarding hospitalization.    Katherine Obrien is a 82 y.o. female with a past medical history significant for hypertension, diverticulitis, GERD, CKD, previous skin cancer, and previous seizure disorder who presents with suspected seizure and aspiration.  According to EMS, patient was at friend's home facility today when she was eating a salad for dinner and had what sounds like a seizure.  Patient reportedly was staring off in the distance and not responding to other people and was choking on her salad.  She then had some coughing and was found to be hypoxic with EMS with an oxygen saturation in the upper 80s.  She was placed on several liters nasal cannula and has improved into the 90s.  Patient was reportedly postictal with EMS for several minutes but glucose was not low.  Patient also urinated on herself.  Patient is now feeling normal upon arrival to the emergency department and has no complaints.  She is denying any chest pain, palpitations or shortness of breath although her oxygen saturations were in the 80s.  Abdomen was nontender.  Chest nontender.  Patient had intact sensation strength and pulse in extremities and had intact finger-nose-finger testing bilaterally.  Symmetric smile.  Clear speech.  Pupils are symmetric and reactive with normal extraocular movements.  No evidence of acute trauma.  Patient resting comfortably now.  Patient said that she  had a similar admission previously and had a workup that did not show evidence of active seizures.  Patient is currently wearing a cardiac monitor as they thought it could have been a syncopal episode instead of seizure.  She reports the monitor finishes up soon.  She otherwise is at her baseline and denies any palpitations or any preceding symptoms before this unresponsive episode.  Given the patient's report of loss of bladder control, unresponsiveness, postictal period, I am somewhat concerned about this being a seizure.  We will keep her on oxygen and get  a chest x-ray.  Will get labs.  Will get a head CT.  Anticipate discussion with neurology to discuss disposition although as she is now on oxygen anticipate she may require admission to the hospital again.  Anticipate reassessment after workup.  Workup continues to return.  X-ray showed no convincing evidence of pneumonia but showed some atelectasis or linear scarring.  When patient returned from imaging, she was off of the oxygen and her oxygen saturation dropped again to 88/89% on room air.  She was placed back on 2 L to maintain oxygen saturations.  She reports she has started coughing some now.  CT head showed evidence of old stroke but no acute stroke.  Troponin negative x 2.  Magnesium normal.  Ammonia normal.  TSH slightly elevated but similar to prior.  CBC did show mild leukocytosis but no anemia.  Metabolic panel appeared similar to prior.  Although it does not appear she has a full aspiration pneumonia now, patient may have some mild pneumonitis from the suspected aspiration event.  I called and spoke to neurology who felt that it was reasonable to get a repeat EEG but this could be more likely syncope than seizure as was thought during her last admission.  Patient does have a monitor on her chest right now, unsure how to have that assessed however with the unresponsive episode, aspiration, hypoxia, and recurrent nature of this, she will  need admission for seizure versus syncope with new hypoxia similar to her admission last month.  Will call medicine for admission.   {Document critical care time when appropriate:1} {Document review of labs and clinical decision tools ie heart score, Chads2Vasc2 etc:1}  {Document your independent review of radiology images, and any outside records:1} {Document your discussion with family members, caretakers, and with consultants:1} {Document social determinants of health affecting pt's care:1} {Document your decision making why or why not admission, treatments were needed:1} Final Clinical Impression(s) / ED Diagnoses Final diagnoses:  Unresponsive episode  Hypoxia  Acute cough     Clinical Impression: 1. Unresponsive episode   2. Hypoxia   3. Acute cough     Disposition: Admit  This note was prepared with assistance of Dragon voice recognition software. Occasional wrong-word or sound-a-like substitutions may have occurred due to the inherent limitations of voice recognition software.

## 2023-03-10 NOTE — ED Triage Notes (Signed)
Pt coming from friends home, goes by "Katherine Obrien". Was eating dinner when she appeared to have an absent seizure, became incontinent. Might have aspirated.   172/87 HR 97

## 2023-03-10 NOTE — ED Notes (Signed)
Daughter Rayfield Citizen [phone] would like an update asap

## 2023-03-11 ENCOUNTER — Encounter (HOSPITAL_COMMUNITY): Payer: Self-pay | Admitting: Internal Medicine

## 2023-03-11 ENCOUNTER — Inpatient Hospital Stay (HOSPITAL_COMMUNITY): Payer: Medicare Other

## 2023-03-11 DIAGNOSIS — R404 Transient alteration of awareness: Secondary | ICD-10-CM

## 2023-03-11 DIAGNOSIS — R569 Unspecified convulsions: Secondary | ICD-10-CM

## 2023-03-11 LAB — URINALYSIS, ROUTINE W REFLEX MICROSCOPIC
Bacteria, UA: NONE SEEN
Bilirubin Urine: NEGATIVE
Glucose, UA: NEGATIVE mg/dL
Hgb urine dipstick: NEGATIVE
Ketones, ur: NEGATIVE mg/dL
Nitrite: NEGATIVE
Protein, ur: NEGATIVE mg/dL
Specific Gravity, Urine: 1.013 (ref 1.005–1.030)
WBC, UA: >50 WBC/hpf (ref 0–5)
pH: 5 (ref 5.0–8.0)

## 2023-03-11 LAB — LACTIC ACID, PLASMA
Lactic Acid, Venous: 1.2 mmol/L (ref 0.5–1.9)
Lactic Acid, Venous: 1.2 mmol/L (ref 0.5–1.9)

## 2023-03-11 LAB — BASIC METABOLIC PANEL
Anion gap: 10 (ref 5–15)
BUN: 13 mg/dL (ref 8–23)
CO2: 24 mmol/L (ref 22–32)
Calcium: 8.8 mg/dL — ABNORMAL LOW (ref 8.9–10.3)
Chloride: 103 mmol/L (ref 98–111)
Creatinine, Ser: 1.03 mg/dL — ABNORMAL HIGH (ref 0.44–1.00)
GFR, Estimated: 54 mL/min — ABNORMAL LOW (ref >60)
Glucose, Bld: 123 mg/dL — ABNORMAL HIGH (ref 70–99)
Potassium: 3.6 mmol/L (ref 3.5–5.1)
Sodium: 137 mmol/L (ref 135–145)

## 2023-03-11 LAB — CBC
HCT: 36.9 % (ref 36.0–46.0)
HCT: 38.5 % (ref 36.0–46.0)
Hemoglobin: 11.7 g/dL — ABNORMAL LOW (ref 12.0–15.0)
Hemoglobin: 12.2 g/dL (ref 12.0–15.0)
MCH: 28.1 pg (ref 26.0–34.0)
MCH: 28.3 pg (ref 26.0–34.0)
MCHC: 31.7 g/dL (ref 30.0–36.0)
MCHC: 31.7 g/dL (ref 30.0–36.0)
MCV: 88.7 fL (ref 80.0–100.0)
MCV: 89.1 fL (ref 80.0–100.0)
Platelets: 246 10*3/uL (ref 150–400)
Platelets: 253 10*3/uL (ref 150–400)
RBC: 4.14 MIL/uL (ref 3.87–5.11)
RBC: 4.34 MIL/uL (ref 3.87–5.11)
RDW: 15.3 % (ref 11.5–15.5)
RDW: 15.3 % (ref 11.5–15.5)
WBC: 11.8 10*3/uL — ABNORMAL HIGH (ref 4.0–10.5)
WBC: 12.3 10*3/uL — ABNORMAL HIGH (ref 4.0–10.5)
nRBC: 0 % (ref 0.0–0.2)
nRBC: 0 % (ref 0.0–0.2)

## 2023-03-11 LAB — GLUCOSE, CAPILLARY
Glucose-Capillary: 108 mg/dL — ABNORMAL HIGH (ref 70–99)
Glucose-Capillary: 100 mg/dL — ABNORMAL HIGH (ref 70–99)
Glucose-Capillary: 103 mg/dL — ABNORMAL HIGH (ref 70–99)

## 2023-03-11 LAB — CREATININE, SERUM
Creatinine, Ser: 0.95 mg/dL (ref 0.44–1.00)
GFR, Estimated: 59 mL/min — ABNORMAL LOW (ref >60)

## 2023-03-11 LAB — PHOSPHORUS: Phosphorus: 3.5 mg/dL (ref 2.5–4.6)

## 2023-03-11 LAB — PROCALCITONIN: Procalcitonin: <0.1 ng/mL

## 2023-03-11 LAB — MAGNESIUM: Magnesium: 1.9 mg/dL (ref 1.7–2.4)

## 2023-03-11 MED ORDER — SODIUM CHLORIDE 0.9 % IV SOLN
3.0000 g | Freq: Three times a day (TID) | INTRAVENOUS | Status: DC
  Administered 2023-03-11 – 2023-03-12 (×5): 3 g via INTRAVENOUS
  Filled 2023-03-11 (×5): qty 8

## 2023-03-11 MED ORDER — INSULIN ASPART 100 UNIT/ML IJ SOLN: 0.0000 [IU] | Freq: Three times a day (TID) | INTRAMUSCULAR | Status: DC

## 2023-03-11 MED ORDER — INSULIN ASPART 100 UNIT/ML IJ SOLN: 0.0000 [IU] | Freq: Every day | INTRAMUSCULAR | Status: DC

## 2023-03-11 MED ORDER — ZONISAMIDE 100 MG PO CAPS
100.0000 mg | ORAL_CAPSULE | Freq: Every day | ORAL | Status: DC
  Administered 2023-03-12 – 2023-03-13 (×2): 100 mg via ORAL
  Filled 2023-03-11 (×2): qty 1

## 2023-03-11 NOTE — ED Notes (Signed)
ED TO INPATIENT HANDOFF REPORT  ED Nurse Name and Phone #: [phone]  S Name/Age/Gender Katherine Obrien 82 y.o. female Room/Bed: 011C/011C  Code Status   Code Status: Full Code  Home/SNF/Other Home Patient oriented to: self, place, time, and situation Is this baseline? Yes   Triage Complete: Triage complete  Chief Complaint Episode of unresponsiveness [R40.4]  Triage Note Pt coming from [facility], goes by "Alvino Chapel". Was eating dinner when she appeared to have an absent seizure, became incontinent. Might have aspirated.   172/87 HR 97    Allergies Allergies  Allergen Reactions   Barbiturates Other (See Comments)    A small amount "overdosed" the patient- "affected my stability and balance" (might have been given at the same as another med?)   Cefdinir Other (See Comments)    A small amount "overdosed" the patient- "affected my stability and balance" (might have been given at the same as another med?)   Codeine Other (See Comments)    "Moderate," per pharmacy   Phenobarbital Other (See Comments)    A small amount "overdosed" the patient- "affected my stability and balance" (might have been given at the same as another med?)   Tegretol [Carbamazepine] Other (See Comments)    A small amount "overdosed" the patient- "affected my stability and balance" (might have been given at the same as another med?)   Tramadol Other (See Comments)    "Moderate," per pharmacy   Zonisamide Other (See Comments)    "Negative reaction" ([place])    Level of Care/Admitting Diagnosis ED Disposition     ED Disposition  Admit   Condition  --   Comment  Hospital Area: [facility] [[id number]]  Level of Care: Telemetry Medical [104]  May admit patient to [facility] or [facility] if equivalent level of care is available:: No  Covid Evaluation: Asymptomatic - no recent exposure (last 10 days) testing not required  Diagnosis: Episode of unresponsiveness [709100]  Admitting  Physician: Darlin Drop [[id number]]  Attending Physician: Darlin Drop [[id number]]  Certification:: I certify this patient will need inpatient services for at least 2 midnights  Estimated Length of Stay: 2          B Medical/Surgery History Past Medical History:  Diagnosis Date   Arthritis    Cancer (HCC)    skin   Cataract 2010   Dr. Luretha Rued, DrRomie Minus @ [facility]   Epilepsy (HCC) 08/11/2015   Hyperlipidemia 12/08/2015   Hypertension    Polyp of colon 08/11/2015   Past Surgical History:  Procedure Laterality Date   ABDOMINAL HYSTERECTOMY  1970   APPENDECTOMY  1958   BREAST SURGERY  1984   COLON SURGERY  09/12/2006   FEMUR SURGERY  2018   Broken   TONSILLECTOMY  09/12/1944     A IV Location/Drains/Wounds Patient Lines/Drains/Airways Status     Active Line/Drains/Airways     None            Intake/Output Last 24 hours No intake or output data in the 24 hours ending 03/11/23 0007  Labs/Imaging Results for orders placed or performed during the hospital encounter of 03/10/23 (from the past 48 hour(s))  CBC with Differential     Status: Abnormal   Collection Time: 03/10/23  7:00 PM  Result Value Ref Range   WBC 12.0 (H) 4.0 - 10.5 K/uL   RBC 4.53 3.87 - 5.11 MIL/uL   Hemoglobin 12.7 12.0 - 15.0 g/dL   HCT 30.8 65.7 -  46.0 %   MCV 89.2 80.0 - 100.0 fL   MCH 28.0 26.0 - 34.0 pg   MCHC 31.4 30.0 - 36.0 g/dL   RDW 16.1 09.6 - 04.5 %   Platelets 266 150 - 400 K/uL   nRBC 0.0 0.0 - 0.2 %   Neutrophils Relative % 73 %   Neutro Abs 8.7 (H) 1.7 - 7.7 K/uL   Lymphocytes Relative 17 %   Lymphs Abs 2.1 0.7 - 4.0 K/uL   Monocytes Relative 8 %   Monocytes Absolute 1.0 0.1 - 1.0 K/uL   Eosinophils Relative 2 %   Eosinophils Absolute 0.2 0.0 - 0.5 K/uL   Basophils Relative 0 %   Basophils Absolute 0.0 0.0 - 0.1 K/uL   Immature Granulocytes 0 %   Abs Immature Granulocytes 0.04 0.00 - 0.07 K/uL    Comment: Performed at [facility], [address]  [place], [place] [address]  Comprehensive metabolic panel     Status: Abnormal   Collection Time: 03/10/23  7:00 PM  Result Value Ref Range   Sodium 138 135 - 145 mmol/L   Potassium 4.1 3.5 - 5.1 mmol/L   Chloride 97 (L) 98 - 111 mmol/L   CO2 25 22 - 32 mmol/L   Glucose, Bld 139 (H) 70 - 99 mg/dL    Comment: Glucose reference range applies only to samples taken after fasting for at least 8 hours.   BUN 17 8 - 23 mg/dL   Creatinine, Ser 1.91 (H) 0.44 - 1.00 mg/dL   Calcium 9.8 8.9 - 47.8 mg/dL   Total Protein 7.3 6.5 - 8.1 g/dL   Albumin 3.6 3.5 - 5.0 g/dL   AST 24 15 - 41 U/L   ALT 17 0 - 44 U/L   Alkaline Phosphatase 89 38 - 126 U/L   Total Bilirubin 0.3 0.3 - 1.2 mg/dL   GFR, Estimated 50 (L) >60 mL/min    Comment: (NOTE) Calculated using the CKD-EPI Creatinine Equation (2021)    Anion gap 16 (H) 5 - 15    Comment: Performed at [facility], [address] [place], [place] [address]  TSH     Status: Abnormal   Collection Time: 03/10/23  7:00 PM  Result Value Ref Range   TSH 5.269 (H) 0.350 - 4.500 uIU/mL    Comment: Performed by a 3rd Generation assay with a functional sensitivity of <=0.01 uIU/mL. Performed at [facility], [address] [place], [place] [address]   Magnesium     Status: None   Collection Time: 03/10/23  7:00 PM  Result Value Ref Range   Magnesium 2.1 1.7 - 2.4 mg/dL    Comment: Performed at [facility], [address] [place], [place] [address]  Ammonia     Status: None   Collection Time: 03/10/23  7:00 PM  Result Value Ref Range   Ammonia 15 9 - 35 umol/L    Comment: Performed at [facility], [address], [place], [place] [address]  Troponin I (High Sensitivity)     Status: None   Collection Time: 03/10/23  7:00 PM  Result Value Ref Range   Troponin I (High Sensitivity) 6 <18 ng/L    Comment: (NOTE) Elevated high sensitivity troponin I (hsTnI) values and significant  changes across serial measurements may suggest ACS but  many other  chronic and acute conditions are known to elevate hsTnI results.  Refer to the "Links" section for  chest pain algorithms and additional  guidance. Performed at [facility], [address] [place], [place] [address]   Troponin I (High Sensitivity)     Status: None   Collection Time: 03/10/23  9:24 PM  Result Value Ref Range   Troponin I (High Sensitivity) 6 <18 ng/L    Comment: (NOTE) Elevated high sensitivity troponin I (hsTnI) values and significant  changes across serial measurements may suggest ACS but many other  chronic and acute conditions are known to elevate hsTnI results.  Refer to the "Links" section for chest pain algorithms and additional  guidance. Performed at [facility], [address] [place], [place] [address]    CT HEAD WO CONTRAST ( )  Result Date: 03/10/2023 CLINICAL DATA:  Syncope/presyncope EXAM: CT HEAD WITHOUT CONTRAST TECHNIQUE: Contiguous axial images were obtained from the base of the skull through the vertex without intravenous contrast. RADIATION DOSE REDUCTION: This exam was performed according to the departmental dose-optimization program which includes automated exposure control, adjustment of the mA and/or kV according to patient size and/or use of iterative reconstruction technique. COMPARISON:  01/27/2023 FINDINGS: Brain: No evidence of acute infarction, hemorrhage, hydrocephalus, extra-axial collection or mass lesion/mass effect. Subcortical white matter and periventricular small vessel ischemic changes. Old right basal ganglia lacunar infarct. Vascular: No hyperdense vessel or unexpected calcification. Skull: Normal. Negative for fracture or focal lesion. Sinuses/Orbits: The visualized paranasal sinuses are essentially clear. The mastoid air cells are unopacified. Other: None. IMPRESSION: No acute intracranial abnormality. Small vessel ischemic changes. Old right basal ganglia lacunar infarct. Electronically Signed   By:  Charline Bills M.D.   On: 03/10/2023 19:45   DG Chest Portable 1 View  Result Date: 03/10/2023 CLINICAL DATA:  Seizures, evaluate for aspiration EXAM: PORTABLE CHEST 1 VIEW COMPARISON:  Previous studies including the examination of 01/27/2023 FINDINGS: Transverse diameter of heart is increased. There are no signs of pulmonary edema or focal pulmonary consolidation. Small linear densities in left lower lung field may suggest scarring or subsegmental atelectasis. There is no pleural effusion or pneumothorax. Degenerative changes are noted in the left shoulder. IMPRESSION: Cardiomegaly. There are no signs of pulmonary edema or new focal pulmonary consolidation. Small linear densities in the lateral aspect of left lower lung field may suggest scarring or subsegmental atelectasis. Electronically Signed   By: Ernie Avena M.D.   On: 03/10/2023 19:27    Pending Labs Unresulted Labs (From admission, onward)     Start     Ordered   03/17/23 0500  Creatinine, serum  (enoxaparin (LOVENOX)    CrCl >/= 30 ml/min)  Weekly,   R     Comments: while on enoxaparin therapy    03/10/23 2333   03/10/23 2333  CBC  (enoxaparin (LOVENOX)    CrCl >/= 30 ml/min)  Once,   R       Comments: Baseline for enoxaparin therapy IF NOT ALREADY DRAWN.  Notify MD if PLT < 100 K.    03/10/23 2333   03/10/23 2333  Creatinine, serum  (enoxaparin (LOVENOX)    CrCl >/= 30 ml/min)  Once,   R       Comments: Baseline for enoxaparin therapy IF NOT ALREADY DRAWN.    03/10/23 2333   03/10/23 1900  Urinalysis, Routine w reflex microscopic -Urine, Clean Catch  Once,   URGENT       Question:  Specimen Source  Answer:  Urine, Clean Catch   03/10/23 1900  Vitals/Pain Today's Vitals   03/10/23 1915 03/10/23 2230 03/10/23 2245 03/11/23 0000  BP: (!) 155/69 (!) 150/74 (!) 145/79 (!) 143/71  Pulse: 91 80 84 78  Resp: 17 15 15 18   SpO2: 96% 95% 95% 98%  Weight:      Height:        Isolation Precautions No  active isolations  Medications Medications  LORazepam (ATIVAN) injection 2 mg (has no administration in time range)  enoxaparin (LOVENOX) injection 40 mg (has no administration in time range)  lactated ringers infusion (has no administration in time range)    Mobility walks     Focused Assessments Cardiac Assessment Handoff:    No results found for: "CKTOTAL", "CKMB", "CKMBINDEX", "TROPONINI" Lab Results  Component Value Date   DDIMER 2.39 (H) 01/27/2023   Does the Patient currently have chest pain? No   , Neuro Assessment Handoff:  Swallow screen pass? Yes          Neuro Assessment:   Neuro Checks:      Has TPA been given? No If patient is a Neuro Trauma and patient is going to OR before floor call report to 4N Charge nurse: [phone] or [phone]  , Pulmonary Assessment Handoff:  Lung sounds:          R Recommendations: See Admitting Provider Note  Report given to:   Additional Notes: n/a

## 2023-03-11 NOTE — H&P (Addendum)
History and Physical  Katherine Obrien DOB: 09-09-1941 DOA: 03/10/2023  Referring physician: Dr. Rush Landmark, EDP  PCP: Mast, Man X, NP  Outpatient Specialists: Cardiology Patient coming from: [facility], independent living facility  Chief Complaint: Unresponsiveness   HPI: Katherine Obrien is a 82 y.o. female with medical history significant for skin cancer, remote history of epilepsy without recurrence since menopause, not on AEDs, hyperlipidemia, hypertension, obesity, CKD 3A, who presents to the ED from her independent living facility, after an episode of unresponsiveness.  Patient was sitting, eating her salad when suddenly she stared off in the distance and was not responding to her environment.  She started choking on her salad and coughing.  Unclear how long this episode lasted.  EMS was activated.  Upon EMS arrival her saturation was in the 80s.  She was placed on 2 L nasal cannula with improvement of her saturation.  The patient lost control of her bladder and had urine incontinence.  Reportedly postictal with EMS for several minutes.  The patient had a similar presentation about a month ago for which she was admitted for syncope versus seizure.  Spot EEG at that time was unremarkable.  She had a Zio patch placed by cardiology as part of workup for syncope.  Upon presentation to the ED today, she is alert and oriented x 3.  She reports that she was in her usual state of health prior to this event.  Head CT was nonacute.  Chest x-ray also was nonacute.  EDP discussed the case with neurology.  Plan for admission and for EEG evaluation.  The patient was admitted by [facility], hospitalist service, to telemetry medical unit as inpatient status.  Might require LTM EEG, defer to neurology.  ED Course: Temperature 98.4.  BP 147/72, pulse 80, respiration rate 17, O2 saturation 99% on 2 L.  Creatinine 1.09 GFR 50.  WBC 12.3.  Review of Systems: Review of systems as noted in the HPI. All  other systems reviewed and are negative.   Past Medical History:  Diagnosis Date   Arthritis    Cancer [facility])    skin   Cataract 2010   Dr. Luretha Rued, Dr.. Romie Minus @ [facility]   Epilepsy [facility]) 08/11/2015   Hyperlipidemia 12/08/2015   Hypertension    Polyp of colon 08/11/2015   Past Surgical History:  Procedure Laterality Date   ABDOMINAL HYSTERECTOMY  1970   APPENDECTOMY  1958   BREAST SURGERY  1984   COLON SURGERY  09/12/2006   FEMUR SURGERY  2018   Broken   TONSILLECTOMY  09/12/1944    Social History:  reports that she has never smoked. She has never used smokeless tobacco. She reports that she does not drink alcohol and does not use drugs.   Allergies  Allergen Reactions   Barbiturates Other (See Comments)    A small amount "overdosed" the patient- "affected my stability and balance" (might have been given at the same as another med?)   Cefdinir Other (See Comments)    A small amount "overdosed" the patient- "affected my stability and balance" (might have been given at the same as another med?)   Codeine Other (See Comments)    "Moderate," per pharmacy   Phenobarbital Other (See Comments)    A small amount "overdosed" the patient- "affected my stability and balance" (might have been given at the same as another med?)   Tegretol [Carbamazepine] Other (See Comments)    A small amount "overdosed" the patient- "affected my stability  and balance" (might have been given at the same as another med?)   Tramadol Other (See Comments)    "Moderate," per pharmacy   Zonisamide Other (See Comments)    "Negative reaction" (Zonegran)    Family History  Problem Relation Age of Onset   Osteoporosis Mother    Dementia Mother    Arthritis Mother    Stroke Father 74       mini   Dementia Father    CVA Father    Breast cancer Sister    Cancer Sister        breast   Dementia Sister        severe   Alzheimer's disease Sister    Diabetes Sister    Hypertension Sister    Stroke Paternal  Grandfather        1970      Prior to Admission medications   Medication Sig Start Date End Date Taking? Authorizing Provider  ammonium lactate (LAC-HYDRIN) 12 % lotion Apply 1 Application topically at bedtime as needed for dry skin (avoid face). Patient not taking: Reported on 01/29/2023    [provider]  Cholecalciferol (VITAMIN D-3) 25 MCG (1000 UT) CAPS TAKE 1 CAPSULE DAILY WITH BREAKFAST. Patient taking differently: Take 1,000 Units by mouth daily with breakfast. 09/24/19   Mast, Man X, NP  ketoconazole (NIZORAL) 2 % shampoo Apply 1 application topically once a week.  04/23/19   [provider]  lisinopril (ZESTRIL) 10 MG tablet Take 1 tablet (10 mg total) by mouth daily. 02/03/23   Mast, Man X, NP  Multiple Vitamins-Minerals (ONE-A-DAY WOMENS 50 PLUS) TABS Take 1 tablet by mouth daily with breakfast.    [provider]  Multiple Vitamins-Minerals (PRESERVISION/LUTEIN) CAPS Take 2 capsules by mouth daily with breakfast.    [provider]  Polyethyl Glycol-Propyl Glycol (SYSTANE) 0.4-0.3 % SOLN Place 1-2 drops into both eyes 3 (three) times daily as needed (for dryness).     [provider]    Physical Exam: BP (!) 143/71   Pulse 78   Temp 98.3 F (36.8 C) (Oral)   Resp 10   Ht 5\' 4"  (1.626 m)   Wt 90.7 kg   SpO2 97%   BMI 34.33 kg/m   General: 82 y.o. year-old female well developed well nourished in no acute distress.  Alert and oriented x3. Cardiovascular: Regular rate and rhythm with no rubs or gallops.  No thyromegaly or JVD noted.  Trace lower extremity edema bilaterally. Respiratory: Clear to auscultation with no wheezes or rales. Good inspiratory effort. Abdomen: Soft nontender nondistended with normal bowel sounds x4 quadrants. Muskuloskeletal: No cyanosis or clubbing noted bilaterally Neuro: CN II-XII intact, strength, sensation, reflexes Skin: No ulcerative lesions noted or rashes Psychiatry: Judgement and insight appear  normal. Mood is appropriate for condition and setting          Labs on Admission:  Basic Metabolic Panel: Recent Labs  Lab 03/10/23 1900 03/11/23 0022  NA 138  --   K 4.1  --   CL 97*  --   CO2 25  --   GLUCOSE 139*  --   BUN 17  --   CREATININE 1.09* 0.95  CALCIUM 9.8  --   MG 2.1  --    Liver Function Tests: Recent Labs  Lab 03/10/23 1900  AST 24  ALT 17  ALKPHOS 89  BILITOT 0.3  PROT 7.3  ALBUMIN 3.6   No results for input(s): "LIPASE", "AMYLASE" in the  last 168 hours. Recent Labs  Lab 03/10/23 1900  AMMONIA 15   CBC: Recent Labs  Lab 03/10/23 1900 03/11/23 0022  WBC 12.0* 12.3*  NEUTROABS 8.7*  --   HGB 12.7 12.2  HCT 40.4 38.5  MCV 89.2 88.7  PLT 266 253   Cardiac Enzymes: No results for input(s): "CKTOTAL", "CKMB", "CKMBINDEX", "TROPONINI" in the last 168 hours.  BNP (last 3 results) Recent Labs    01/27/23 1511  BNP 32.5    ProBNP (last 3 results) No results for input(s): "PROBNP" in the last 8760 hours.  CBG: No results for input(s): "GLUCAP" in the last 168 hours.  Radiological Exams on Admission: CT HEAD WO CONTRAST ( )  Result Date: 03/10/2023 CLINICAL DATA:  Syncope/presyncope EXAM: CT HEAD WITHOUT CONTRAST TECHNIQUE: Contiguous axial images were obtained from the base of the skull through the vertex without intravenous contrast. RADIATION DOSE REDUCTION: This exam was performed according to the departmental dose-optimization program which includes automated exposure control, adjustment of the mA and/or kV according to patient size and/or use of iterative reconstruction technique. COMPARISON:  01/27/2023 FINDINGS: Brain: No evidence of acute infarction, hemorrhage, hydrocephalus, extra-axial collection or mass lesion/mass effect. Subcortical white matter and periventricular small vessel ischemic changes. Old right basal ganglia lacunar infarct. Vascular: No hyperdense vessel or unexpected calcification. Skull: Normal. Negative for  fracture or focal lesion. Sinuses/Orbits: The visualized paranasal sinuses are essentially clear. The mastoid air cells are unopacified. Other: None. IMPRESSION: No acute intracranial abnormality. Small vessel ischemic changes. Old right basal ganglia lacunar infarct. Electronically Signed   By: Charline Bills M.D.   On: 03/10/2023 19:45   DG Chest Portable 1 View  Result Date: 03/10/2023 CLINICAL DATA:  Seizures, evaluate for aspiration EXAM: PORTABLE CHEST 1 VIEW COMPARISON:  Previous studies including the examination of 01/27/2023 FINDINGS: Transverse diameter of heart is increased. There are no signs of pulmonary edema or focal pulmonary consolidation. Small linear densities in left lower lung field may suggest scarring or subsegmental atelectasis. There is no pleural effusion or pneumothorax. Degenerative changes are noted in the left shoulder. IMPRESSION: Cardiomegaly. There are no signs of pulmonary edema or new focal pulmonary consolidation. Small linear densities in the lateral aspect of left lower lung field may suggest scarring or subsegmental atelectasis. Electronically Signed   By: Ernie Avena M.D.   On: 03/10/2023 19:27    EKG: I independently viewed the EKG done and my findings are as followed: Sinus rhythm rate of 82.  Nonspecific ST-T changes.  QTc 451.  Assessment/Plan Present on Admission:  Episode of unresponsiveness  Principal Problem:   Episode of unresponsiveness  Episode of unresponsiveness, seizure versus syncope. History of remote epilepsy before menopause, many years ago Currently not on AEDs prior to admission. Plan for EEG, ordered and pending Consider neurology consultation in the morning. The patient has a Zio patch in place. Monitor on telemetry Seizure precautions in place IV Ativan as needed for breakthrough seizure is in place.  Concern for aspiration Choking on her meal during episode of unresponsiveness Elderly patient with concern for  worsening respiratory status We will start Unasyn and cover x 3 days WBC 12.3K, monitor fever curve and WBCs. Repeat CBC in the morning and obtain baseline procalcitonin.  Acute hypoxic respiratory failure likely from aspiration Not on oxygen supplementation at baseline O2 saturation dropped to 88% on room air Currently on 2 L [place] and maintaining her saturation Wean off O2 supplementation as tolerated. Maintain O2 saturation above 92% Incentive spirometer when  awake Obtain Home O2 evaluation tomorrow  Prediabetes Presented with serum glucose 139. Last hemoglobin A1c 6.4 on 10/04/2022 Start insulin sliding scale for hyperglycemia. Gentle IV fluid hydration LR 50 cc/h x 1 day.  Essential hypertension BP is stable Resume home lisinopril. Continue to monitor vital signs.  CKD 3A Renal function is currently at baseline Avoid nephrotoxic agents, dehydration and hypotension. Monitor urine output  Obesity BMI 34 Recommend weight loss outpatient with regular physical activity and healthy dieting.    Time: 75 minutes    DVT prophylaxis: Subcu Lovenox daily  Code Status: Full code  Family Communication: None at bedside.  Disposition Plan: Admitted to telemetry medical unit.  Consults called: None.  Admission status: Inpatient status.   Status is: Inpatient The patient requires at least 2 midnights for further evaluation and treatment of present condition.   Darlin Drop MD [facility] Pager [phone]  If 7PM-7AM, please contact night-coverage [email or web address] Password [email or web address]  03/11/2023, 1:54 AM

## 2023-03-11 NOTE — Progress Notes (Signed)
PROGRESS NOTE    Katherine Obrien  NWG:[id number] DOB: [birth year] DOA: 03/10/2023 PCP: Mast, Man X, NP  Outpatient Specialists:     Brief Narrative:  Patient is an 82 year old female with past medical history significant for epilepsy (not on medication), skin cancer, hyperlipidemia, colonic polyp and hypertension.  Patient was admitted with episode of unresponsiveness, worrisome for possible seizure.  EEG revealed mild diffuse encephalopathy, without seizures or epileptiform discharges.  Patient was put on IV Unasyn, considering risk of aspiration pneumonia.  Chest x-ray is nonrevealing.  Troponins are negative.  03/11/2023: Patient seen.  Available records reviewed.  According to the patient, she was on antiepileptic medications on and off since mid 30s, and the medications were discontinued about 2 years ago.  Patient told me that the reason for discontinuation was that the seizure stopped.  Patient had similar episode of unresponsiveness a month ago.  Difficult to ascertain the frequency of her unresponsiveness.  Admitted with concerns for possible seizures.  Negative EEG.  Concerns for aspiration risk, on IV Unasyn.  Will check procalcitonin.  Will consult neurology team.   Assessment & Plan:   Principal Problem:   Episode of unresponsiveness   Episode of unresponsiveness, seizure versus syncope. -See above documentation. -Patient was on antiepileptic medication on and off for about 14 years, but this was discontinued about 2 years ago.  Patient tells me that the reason for discontinuation of antiseizure medication was that the seizure stopped. -Episodes of unresponsiveness.  Concerns for possible seizure.  Patient also had similar episode last month. -Patient could not or would not describe any details. -EEG is negative for seizure or epileptiform discharges. -Await neurology input. -Will discharge patient back to independent living facility once cleared for discharge by the  neurology team.   -Will check lactic acid as well.  Check procalcitonin.   Concern for aspiration -As per prior documentation: "Choking on her meal during episode of unresponsiveness" -Currently on IV Unasyn. -Check procalcitonin. -Mild leukocytosis.    Acute hypoxic respiratory failure likely from aspiration -Likely from possible seizure activity. -Aspiration is not confirmed.   -Currently on 2 L [place] and maintaining her saturation -Wean off O2 supplementation as tolerated. -Maintain O2 saturation above 92% -Aspiration precautions.     Prediabetes Presented with serum glucose 139. Last hemoglobin A1c 6.4 on 10/04/2022 Start insulin sliding scale for hyperglycemia. Gentle IV fluid hydration LR 50 cc/h x 1 day. 03/11/2023: Check A1c.   Essential hypertension -Continue to monitor closely. -Goal blood pressure should be less than 130/80 mmHg. -Avoid excessive drops in blood pressure. -Keep MAP greater than 65 mmHg. -Patient is not currently on any antihypertensives. -Restart lisinopril when blood pressure allows.  Currently, last documented systolic blood pressure was 110 mmHg.    CKD 3A -Likely secondary to hypertension. -Renal function is at baseline. -Avoid nephrotoxins. -Will defer decision to start SGLT2 inhibitor today primary care team and outpatient nephrology team.   Obesity BMI 34 Diet and exercise. Follow A1c. Consider subcutaneous semaglutide or Mounjaro if not contraindicated.    DVT prophylaxis: Subcutaneous Lovenox. Code Status: Full code. Family Communication:  Disposition Plan: This will depend on hospital course.   Consultants:  Neurology.  Procedures:  EEG.  Antimicrobials:  IV Unasyn.   Subjective: No follow-up was unresponsiveness" reported.  Objective: Vitals:   03/11/23 0000 03/11/23 0027 03/11/23 0202 03/11/23 0744  BP: (!) 143/71  (!) 147/72 (!) 110/50  Pulse: 78 78 80 78  Resp: 18 10 17 18   Temp:  98.3  F (36.8 C) 98.4 F (36.9  C) 98.9 F (37.2 C)  TempSrc:  Oral  Oral  SpO2: 98% 97% 99% 97%  Weight:      Height:        Intake/Output Summary (Last 24 hours) at 04/05/2023 0951 Last data filed at 04-05-23 0547 Gross per 24 hour  Intake 571.82 ml  Output --  Net 571.82 ml   Filed Weights   03/10/23 1845  Weight: 90.7 kg    Examination:  General exam: Appears calm and comfortable.  Patient is pale. Respiratory system: Clear to auscultation.  Cardiovascular system: S1 & S2 heard Gastrointestinal system: Abdomen is nondistended, soft and nontender.  Central nervous system: Alert and oriented.  Patient moves all extremities.   Extremities: Fullness of the ankle.  Data Reviewed: I have personally reviewed following labs and imaging studies  CBC: Recent Labs  Lab 03/10/23 1900 04/05/2023 0022 04-05-23 0432  WBC 12.0* 12.3* 11.8*  NEUTROABS 8.7*  --   --   HGB 12.7 12.2 11.7*  HCT 40.4 38.5 36.9  MCV 89.2 88.7 89.1  PLT 266 253 246   Basic Metabolic Panel: Recent Labs  Lab 03/10/23 1900 April 05, 2023 0022 04-05-2023 0432  NA 138  --  137  K 4.1  --  3.6  CL 97*  --  103  CO2 25  --  24  GLUCOSE 139*  --  123*  BUN 17  --  13  CREATININE 1.09* 0.95 1.03*  CALCIUM 9.8  --  8.8*  MG 2.1  --  1.9  PHOS  --   --  3.5   GFR: Estimated Creatinine Clearance: 45.1 mL/min (A) (by C-G formula based on SCr of 1.03 mg/dL (H)). Liver Function Tests: Recent Labs  Lab 03/10/23 1900  AST 24  ALT 17  ALKPHOS 89  BILITOT 0.3  PROT 7.3  ALBUMIN 3.6   No results for input(s): "LIPASE", "AMYLASE" in the last 168 hours. Recent Labs  Lab 03/10/23 1900  AMMONIA 15   Coagulation Profile: No results for input(s): "INR", "PROTIME" in the last 168 hours. Cardiac Enzymes: No results for input(s): "CKTOTAL", "CKMB", "CKMBINDEX", "TROPONINI" in the last 168 hours. BNP (last 3 results) No results for input(s): "PROBNP" in the last 8760 hours. HbA1C: No results for input(s): "HGBA1C" in the last 72  hours. CBG: Recent Labs  Lab 04/05/2023 0829  GLUCAP 108*   Lipid Profile: No results for input(s): "CHOL", "HDL", "LDLCALC", "TRIG", "CHOLHDL", "LDLDIRECT" in the last 72 hours. Thyroid Function Tests: Recent Labs    03/10/23 1900  TSH 5.269*   Anemia Panel: No results for input(s): "VITAMINB12", "FOLATE", "FERRITIN", "TIBC", "IRON", "RETICCTPCT" in the last 72 hours. Urine analysis:    Component Value Date/Time   COLORURINE STRAW (A) 03/24/2022 2018   APPEARANCEUR CLEAR 03/24/2022 2018   LABSPEC 1.009 03/24/2022 2018   PHURINE 7.0 03/24/2022 2018   GLUCOSEU NEGATIVE 03/24/2022 2018   HGBUR NEGATIVE 03/24/2022 2018   BILIRUBINUR NEGATIVE 03/24/2022 2018   KETONESUR NEGATIVE 03/24/2022 2018   PROTEINUR NEGATIVE 03/24/2022 2018   NITRITE NEGATIVE 03/24/2022 2018   LEUKOCYTESUR TRACE (A) 03/24/2022 2018   Sepsis Labs: @LABRCNTIP (procalcitonin:4,lacticidven:4)  )No results found for this or any previous visit (from the past 240 hour(s)).       Radiology Studies: EEG adult  Result Date: Apr 05, 2023 Charlsie Quest, MD     April 05, 2023  6:39 AM Patient Name: Katherine Obrien MRN: [id number] Epilepsy Attending: Charlsie Quest Referring Physician/Provider: Lynden Oxford  J, MD Date: 03/11/2023 Duration: 25.36 mins Patient history:  82 y.o. female with medical history significant for skin cancer, remote history of epilepsy without recurrence since menopause, not on AEDs, hyperlipidemia, hypertension, obesity, CKD 3A, who presents to the ED from her independent living facility, after an episode of unresponsiveness.  EEG to evaluate for seizure. Level of alertness: Awake, asleep AEDs during EEG study: None Technical aspects: This EEG study was done with scalp electrodes positioned according to the 10-20 International system of electrode placement. Electrical activity was reviewed with band pass filter of 1-70Hz , sensitivity of 7 uV/mm, display speed of 26mm/sec with a 60Hz   notched filter applied as appropriate. EEG data were recorded continuously and digitally stored.  Video monitoring was available and reviewed as appropriate. Description: The posterior dominant rhythm consists of 8-9 Hz activity of moderate voltage (25-35 uV) seen predominantly in posterior head regions, symmetric and reactive to eye opening and eye closing. Sleep was characterized by vertex waves, sleep spindles (12 to 14 Hz), maximal frontocentral region.  EEG showed intermittent generalized 3 to 6 Hz theta-delta slowing. Photic stimulation and hyperventilation were not performed.    ABNORMALITY - Intermittent slow, generalized  IMPRESSION: This study is suggestive of mild diffuse encephalopathy, nonspecific etiology.  No seizures or epileptiform discharges were seen throughout the recording.  Priyanka Annabelle Harman    CT HEAD WO CONTRAST ( )  Result Date: 03/10/2023 CLINICAL DATA:  Syncope/presyncope EXAM: CT HEAD WITHOUT CONTRAST TECHNIQUE: Contiguous axial images were obtained from the base of the skull through the vertex without intravenous contrast. RADIATION DOSE REDUCTION: This exam was performed according to the departmental dose-optimization program which includes automated exposure control, adjustment of the mA and/or kV according to patient size and/or use of iterative reconstruction technique. COMPARISON:  01/27/2023 FINDINGS: Brain: No evidence of acute infarction, hemorrhage, hydrocephalus, extra-axial collection or mass lesion/mass effect. Subcortical white matter and periventricular small vessel ischemic changes. Old right basal ganglia lacunar infarct. Vascular: No hyperdense vessel or unexpected calcification. Skull: Normal. Negative for fracture or focal lesion. Sinuses/Orbits: The visualized paranasal sinuses are essentially clear. The mastoid air cells are unopacified. Other: None. IMPRESSION: No acute intracranial abnormality. Small vessel ischemic changes. Old right basal ganglia lacunar  infarct. Electronically Signed   By: Charline Bills M.D.   On: 03/10/2023 19:45   DG Chest Portable 1 View  Result Date: 03/10/2023 CLINICAL DATA:  Seizures, evaluate for aspiration EXAM: PORTABLE CHEST 1 VIEW COMPARISON:  Previous studies including the examination of 01/27/2023 FINDINGS: Transverse diameter of heart is increased. There are no signs of pulmonary edema or focal pulmonary consolidation. Small linear densities in left lower lung field may suggest scarring or subsegmental atelectasis. There is no pleural effusion or pneumothorax. Degenerative changes are noted in the left shoulder. IMPRESSION: Cardiomegaly. There are no signs of pulmonary edema or new focal pulmonary consolidation. Small linear densities in the lateral aspect of left lower lung field may suggest scarring or subsegmental atelectasis. Electronically Signed   By: Ernie Avena M.D.   On: 03/10/2023 19:27        Scheduled Meds:  enoxaparin (LOVENOX) injection  40 mg Subcutaneous Q24H   insulin aspart  0-5 Units Subcutaneous QHS   insulin aspart  0-9 Units Subcutaneous TID WC   Continuous Infusions:  ampicillin-sulbactam (UNASYN) IV Stopped (03/11/23 0357)   lactated ringers 50 mL/hr at 03/11/23 0547     LOS: 1 day    Time spent: 35 minutes.  Patient was admitted earlier this morning.  Dana Allan, MD  [facility] Pager #: [phone] 7PM-7AM contact night coverage as above

## 2023-03-11 NOTE — Procedures (Signed)
Patient Name: Saher Hickel  MRN: [id number]  Epilepsy Attending: Charlsie Quest  Referring Physician/Provider: Tegeler, Canary Brim, MD  Date: 03/11/2023 Duration: 25.36 mins  Patient history:  82 y.o. female with medical history significant for skin cancer, remote history of epilepsy without recurrence since menopause, not on AEDs, hyperlipidemia, hypertension, obesity, CKD 3A, who presents to the ED from her independent living facility, after an episode of unresponsiveness.  EEG to evaluate for seizure.  Level of alertness: Awake, asleep  AEDs during EEG study: None  Technical aspects: This EEG study was done with scalp electrodes positioned according to the 10-20 International system of electrode placement. Electrical activity was reviewed with band pass filter of 1-70Hz , sensitivity of 7 uV/mm, display speed of 2mm/sec with a 60Hz  notched filter applied as appropriate. EEG data were recorded continuously and digitally stored.  Video monitoring was available and reviewed as appropriate.  Description: The posterior dominant rhythm consists of 8-9 Hz activity of moderate voltage (25-35 uV) seen predominantly in posterior head regions, symmetric and reactive to eye opening and eye closing. Sleep was characterized by vertex waves, sleep spindles (12 to 14 Hz), maximal frontocentral region.  EEG showed intermittent generalized 3 to 6 Hz theta-delta slowing. Photic stimulation and hyperventilation were not performed.      ABNORMALITY - Intermittent slow, generalized   IMPRESSION: This study is suggestive of mild diffuse encephalopathy, nonspecific etiology.  No seizures or epileptiform discharges were seen throughout the recording.   Monigue Spraggins Annabelle Harman

## 2023-03-11 NOTE — Progress Notes (Signed)
Pharmacy Antibiotic Note  Katherine Obrien is a 82 y.o. female admitted on 03/10/2023 with  ?seizure episode .  Pharmacy has been consulted for Unasyn dosing for possible aspiration pneumonia. WBC 12.3. Renal function age appropriate.   Plan: Unasyn 3g IV q8h   Height: 5\' 4"  (162.6 cm) Weight: 90.7 kg (200 lb) IBW/kg (Calculated) : 54.7  Temp (24hrs), Avg:98.4 F (36.9 C), Min:98.3 F (36.8 C), Max:98.4 F (36.9 C)  Recent Labs  Lab 03/10/23 1900 03/11/23 0022  WBC 12.0* 12.3*  CREATININE 1.09* 0.95    Estimated Creatinine Clearance: 48.9 mL/min (by C-G formula based on SCr of 0.95 mg/dL).    Allergies  Allergen Reactions   Barbiturates Other (See Comments)    A small amount "overdosed" the patient- "affected my stability and balance" (might have been given at the same as another med?)   Cefdinir Other (See Comments)    A small amount "overdosed" the patient- "affected my stability and balance" (might have been given at the same as another med?)   Codeine Other (See Comments)    "Moderate," per pharmacy   Phenobarbital Other (See Comments)    A small amount "overdosed" the patient- "affected my stability and balance" (might have been given at the same as another med?)   Tegretol [Carbamazepine] Other (See Comments)    A small amount "overdosed" the patient- "affected my stability and balance" (might have been given at the same as another med?)   Tramadol Other (See Comments)    "Moderate," per pharmacy   Zonisamide Other (See Comments)    "Negative reaction" (Zonegran)    Abran [facility], PharmD, BCPS Clinical Pharmacist Phone: [phone]

## 2023-03-11 NOTE — Progress Notes (Signed)
EEG complete - results pending 

## 2023-03-11 NOTE — ED Notes (Signed)
EEG tech @ bedside, in no acute distress. Vitals stable. Call bell in reach.

## 2023-03-11 NOTE — Consult Note (Signed)
Neurology Consultation  Reason for Consult: concern for seizures  Referring Physician: Dr. Dartha Lodge   CC: episode of unresponsiveness   History is obtained from:medical record   HPI: Kritika Lapole is a 82 y.o. female with past medical history of hypertension, hyperlipidemia, seizure disorder not on AEDs, obesity, chronic kidney disease presents to [facility], ED via EMS for an evaluation Of unresponsiveness.  Per chart review she was sitting down eating lunch when she suddenly started staring off in the distance and was not responding, started choking on her salad.  EMS arrived and oxygen saturations were in the 80s and she was placed on nasal cannula with improvement.  It was noted that she had urine incontinence and was postictal with EMS.  Per chart review she had a similar episode a month ago and was admitted for a syncopal versus seizure workup, EEG was unremarkable and she had a Zio patch ordered for syncope workup. She is reluctant to talk to me and answer my questions, however she will tell me that she did have petit mall seizures when she was younger and was on AEDs however cannot remember which one.  Her seizure medications were stopped about 2 years ago and she states it was because she has not had seizures in over 30 years. She does not remember which medication she was on. Per chart review she was on Zonisamide   She states she does not remember all events that occurred, however will not share any details that she remembers, she was noted to be incontinent of urine but states that it was because she was choking on her food. She currently feels as if she is back to base line   EEG with generalized slowing, no seizure or epileptiform discharges seen.   Per chart review, she has been seen on multiple occasions for unresponsiveness, c/f seizure activity over the last year. She was admitted to the hospital on 01/27/2023 for unresponsive/seizure like activity, was seen in the ED on  09/10/2022 for unresponsiveness/seizure activity and on 03/24/2022 for being found unresponsive.   She has seen Dr. Teresa Coombs in the office for follow up after admission in 05/2022.   ROS: Full ROS was performed and is negative except as noted in the HPI.   Past Medical History:  Diagnosis Date   Arthritis    Cancer [facility])    skin   Cataract 2010   Dr. Luretha Rued, Dr.. Romie Minus @ [facility]   Epilepsy [facility]) 08/11/2015   Hyperlipidemia 12/08/2015   Hypertension    Polyp of colon 08/11/2015     Family History  Problem Relation Age of Onset   Osteoporosis Mother    Dementia Mother    Arthritis Mother    Stroke Father 98       mini   Dementia Father    CVA Father    Breast cancer Sister    Cancer Sister        breast   Dementia Sister        severe   Alzheimer's disease Sister    Diabetes Sister    Hypertension Sister    Stroke Paternal Grandfather        1970     Social History:   reports that she has never smoked. She has never used smokeless tobacco. She reports that she does not drink alcohol and does not use drugs.  Medications  Current Facility-Administered Medications:    Ampicillin-Sulbactam (UNASYN) 3 g in sodium chloride 0.9 % 100 mL IVPB, 3  g, Intravenous, Q8H, Stevphen Rochester, RPH, Last Rate: 200 mL/hr at 03/11/23 1035, 3 g at 03/11/23 1035   enoxaparin (LOVENOX) injection 40 mg, 40 mg, Subcutaneous, Q24H, Hall, Carole N, DO, 40 mg at 03/11/23 1035   insulin aspart (novoLOG) injection 0-5 Units, 0-5 Units, Subcutaneous, QHS, Hall, Carole N, DO   insulin aspart (novoLOG) injection 0-9 Units, 0-9 Units, Subcutaneous, TID WC, Hall, Carole N, DO   lactated ringers infusion, , Intravenous, Continuous, [place] N, DO, Last Rate: 50 mL/hr at 03/11/23 0547, Infusion Verify at 03/11/23 0547   LORazepam (ATIVAN) injection 2 mg, 2 mg, Intravenous, Q6H PRN, Darlin Drop, DO   Exam: Current vital signs: BP (!) 110/50 (BP Location: Left Arm)   Pulse 78   Temp 98.9 F (37.2 C)  (Oral)   Resp 18   Ht 5\' 4"  (1.626 m)   Wt 90.7 kg   SpO2 97%   BMI 34.33 kg/m  Vital signs in last 24 hours: Temp:  [98.3 F (36.8 C)-98.9 F (37.2 C)] 98.9 F (37.2 C) (06/29 0744) Pulse Rate:  [78-91] 78 (06/29 0744) Resp:  [10-18] 18 (06/29 0744) BP: (110-155)/(50-79) 110/50 (06/29 0744) SpO2:  [95 %-99 %] 97 % (06/29 0744) Weight:  [90.7 kg] 90.7 kg (06/28 1845)  GENERAL: Awake, alert in NAD HEENT: - Normocephalic and atraumatic, dry mm LUNGS - Clear to auscultation bilaterally with no wheezes CV - S1S2 RRR, no m/r/g, equal pulses bilaterally. ABDOMEN - Soft, nontender, nondistended with normoactive BS Ext: warm, well perfused, intact peripheral pulses, bilateral lower extremity edema  NEURO:  Mental Status: AA&Ox3  Language: speech is clear.  Naming, repetition, fluency, and comprehension intact. Cranial Nerves: PERRL EOMI, visual fields full, no facial asymmetry, facial sensation intact, hearing intact, tongue/uvula/soft palate midline, normal sternocleidomastoid and trapezius muscle strength. No evidence of tongue atrophy or fibrillations Motor: 5/5 in bilateral uppers 4/4 in bilateral lowers  Tone: is normal and bulk is normal Sensation- Intact to light touch bilaterally Coordination: FTN intact bilaterally, no ataxia in BLE. Gait- deferred   Labs I have reviewed labs in epic and the results pertinent to this consultation are:  CBC    Component Value Date/Time   WBC 11.8 (H) 03/11/2023 0432   RBC 4.14 03/11/2023 0432   HGB 11.7 (L) 03/11/2023 0432   HCT 36.9 03/11/2023 0432   PLT 246 03/11/2023 0432   MCV 89.1 03/11/2023 0432   MCH 28.3 03/11/2023 0432   MCHC 31.7 03/11/2023 0432   RDW 15.3 03/11/2023 0432   LYMPHSABS 2.1 03/10/2023 1900   MONOABS 1.0 03/10/2023 1900   EOSABS 0.2 03/10/2023 1900   BASOSABS 0.0 03/10/2023 1900    CMP     Component Value Date/Time   NA 137 03/11/2023 0432   NA 141 10/19/2018 0000   K 3.6 03/11/2023 0432   CL 103  03/11/2023 0432   CL 106 10/19/2018 0000   CO2 24 03/11/2023 0432   CO2 30 10/19/2018 0000   GLUCOSE 123 (H) 03/11/2023 0432   BUN 13 03/11/2023 0432   BUN 13 10/19/2018 0000   CREATININE 1.03 (H) 03/11/2023 0432   CREATININE 0.96 (H) 02/14/2023 0734   CALCIUM 8.8 (L) 03/11/2023 0432   CALCIUM 9.0 10/19/2018 0000   PROT 7.3 03/10/2023 1900   PROT 6.5 10/19/2018 0000   ALBUMIN 3.6 03/10/2023 1900   ALBUMIN 3.7 10/19/2018 0000   AST 24 03/10/2023 1900   ALT 17 03/10/2023 1900   ALKPHOS 89 03/10/2023 1900  BILITOT 0.3 03/10/2023 1900   GFRNONAA 54 (L) 03/11/2023 0432   GFRNONAA 64 10/19/2018 0000   GFRAA >60 11/05/2019 1434    Lipid Panel     Component Value Date/Time   CHOL 202 (H) 10/04/2022 0734   TRIG 136 10/04/2022 0734   HDL 52 10/04/2022 0734   CHOLHDL 3.9 10/04/2022 0734   LDLCALC 125 (H) 10/04/2022 0734    Lab Results  Component Value Date   HGBA1C 6.4 (H) 10/04/2022      Imaging I have reviewed the images obtained:  CT-head no acute process   I have seen the patient and reviewed the above note.  She has a history of catamenial epilepsy though she has continued to have a few seizures after menopause.  The episodes that she had recently certainly could have been seizure, and the association with hypertension would argue against syncope.  Assessment:  82 y.o. female with past medical history of hypertension, hyperlipidemia, seizure disorder not on AEDs, obesity, chronic kidney disease presents to [facility], ED via EMS for an evaluation Of unresponsiveness.  She was hypertensive with these episodes per the daughter, and therefore syncope may be less likely.  That being said, she is wearing a Zio patch and so this should be interrogated to see if there were any events around the time of her episodes.  Whether or not these represent seizures, I would recommend lifelong antiepileptic therapy with zonisamide given her history.  She states that she went off of it  because she was not feeling great, but felt no better when she was off of it.  Given that it did not improve her sensation, I doubt this and this might was causing her significant problems and I discussed this with her and she agreed with resuming it.  She was on a relatively low-dose, and I will resume it at that dose.  Impression: Seizures vs syncope   Recommendations: - Zonisamide 100 mg daily, could increase if spells of unclear etiology continue -Interrogate Zio patch -Neurology will follow.  Ritta Slot, MD [facility] [phone]  If 7pm- 7am, please page neurology on call as listed in AMION.

## 2023-03-12 DIAGNOSIS — R404 Transient alteration of awareness: Secondary | ICD-10-CM | POA: Diagnosis not present

## 2023-03-12 LAB — GLUCOSE, CAPILLARY
Glucose-Capillary: 91 mg/dL (ref 70–99)
Glucose-Capillary: 116 mg/dL — ABNORMAL HIGH (ref 70–99)
Glucose-Capillary: 95 mg/dL (ref 70–99)
Glucose-Capillary: 137 mg/dL — ABNORMAL HIGH (ref 70–99)

## 2023-03-12 MED ORDER — LISINOPRIL 10 MG PO TABS
10.0000 mg | ORAL_TABLET | Freq: Every day | ORAL | Status: DC
  Administered 2023-03-12 – 2023-03-13 (×2): 10 mg via ORAL
  Filled 2023-03-12 (×2): qty 1

## 2023-03-12 NOTE — Progress Notes (Signed)
PROGRESS NOTE    Greicy Giffen  BJY:[id number] DOB: 1941-07-11 DOA: 03/10/2023 PCP: Mast, Man X, NP  Outpatient Specialists:     Brief Narrative:  Patient is an 82 year old female with past medical history significant for epilepsy (not on medication), skin cancer, hyperlipidemia, colonic polyp and hypertension.  Patient was admitted with episode of unresponsiveness, worrisome for possible seizure.  EEG revealed mild diffuse encephalopathy, without seizures or epileptiform discharges.  Patient was put on IV Unasyn, considering risk of aspiration pneumonia.  Chest x-ray is nonrevealing.  Troponins are negative.  03/11/2023: Patient seen.  Available records reviewed.  According to the patient, she was on antiepileptic medications on and off since mid 30s, and the medications were discontinued about 2 years ago.  Patient told me that the reason for discontinuation was that the seizure stopped.  Patient had similar episode of unresponsiveness a month ago.  Difficult to ascertain the frequency of her unresponsiveness.  Admitted with concerns for possible seizures.  Negative EEG.  Concerns for aspiration risk, on IV Unasyn.  Will check procalcitonin.  Will consult neurology team.  03/12/2023: Patient seen.  No further episodes of unresponsiveness noted.  Neurology input is appreciated, zonisamide has been prescribed.  Patient has a telemetry monitoring placed on last admission.  Contacted the cardiology/EP team to review for any possible events.  Cardiology/EP team will get back to [place] tomorrow.  Likely discharge back home tomorrow if telemetry monitoring is nonrevealing.   Assessment & Plan:   Principal Problem:   Episode of unresponsiveness   Episode of unresponsiveness, seizure versus syncope. -See above documentation. -Patient was on antiepileptic medication on and off for about 40 years, but this was discontinued about 2 years ago.  Patient tells me that the reason for discontinuation of  antiseizure medication was that the seizure stopped. -Episodes of unresponsiveness.  Concerns for possible seizure.  Patient also had similar episode last month. -Patient could not or would not describe any details. -EEG was negative for seizure or epileptiform discharges. -Neurology input is appreciated, zonisamide has been restarted. -Patient has telemetry monitoring placed about a month ago.  Cardiology/EP team will review tomorrow, patient may be discharged back home if telemetry monitoring is nonrevealing. -Will discharge patient back to independent living facility if the telemetry monitoring is nonrevealing. -Normal lactic acid.   Concern for aspiration -As per prior documentation: "Choking on her meal during episode of unresponsiveness" -Currently on IV Unasyn. -Procalcitonin is less than 0.1. -Will discontinue IV antibiotics.     Acute hypoxic respiratory failure likely from aspiration -Likely from possible seizure activity. -Aspiration is not confirmed.   -Currently on 2 L [place] and maintaining her saturation -Wean off O2 supplementation as tolerated. -Maintain O2 saturation above 92% -Aspiration precautions.     Prediabetes Presented with serum glucose 139. Last hemoglobin A1c 6.4 on 10/04/2022 Start insulin sliding scale for hyperglycemia. Gentle IV fluid hydration LR 50 cc/h x 1 day. 03/12/2023: Check A1c.   Essential hypertension -Continue to monitor closely. -Goal blood pressure should be less than 130/80 mmHg. -Avoid excessive drops in blood pressure. -Keep MAP greater than 65 mmHg. -Will restart lisinopril 10 Mg p.o. once daily.     CKD 3A -Likely secondary to hypertension. -Renal function is at baseline. -Avoid nephrotoxins. -Will defer decision to start SGLT2 inhibitor today primary care team and outpatient nephrology team.   Obesity BMI 34 Diet and exercise. Follow A1c. Consider subcutaneous semaglutide or Mounjaro if not contraindicated.    DVT  prophylaxis: Subcutaneous Lovenox.  Code Status: Full code. Family Communication:  Disposition Plan: This will depend on hospital course.   Consultants:  Neurology.  Procedures:  EEG.  Antimicrobials:  IV Unasyn discontinued.   Subjective: No follow-up was unresponsiveness" reported.  Objective: Vitals:   03/11/23 1924 03/11/23 2200 03/12/23 0430 03/12/23 0817  BP: (!) 140/62  (!) 144/73 (!) 128/59  Pulse: 81  77 68  Resp: 18  18 16   Temp: 98.7 F (37.1 C)  98.7 F (37.1 C) 98.3 F (36.8 C)  TempSrc:    Oral  SpO2: 98% 94% 96% 94%  Weight:      Height:       No intake or output data in the 24 hours ending 03/12/23 0933  Filed Weights   03/10/23 1845  Weight: 90.7 kg    Examination:  General exam: Appears calm and comfortable.  Patient is pale. Respiratory system: Clear to auscultation.  Cardiovascular system: S1 & S2 heard Gastrointestinal system: Abdomen is nondistended, soft and nontender.  Central nervous system: Alert and oriented.  Patient moves all extremities.   Extremities: Fullness of the ankle.  Data Reviewed: I have personally reviewed following labs and imaging studies  CBC: Recent Labs  Lab 03/10/23 1900 03/11/23 0022 03/11/23 0432  WBC 12.0* 12.3* 11.8*  NEUTROABS 8.7*  --   --   HGB 12.7 12.2 11.7*  HCT 40.4 38.5 36.9  MCV 89.2 88.7 89.1  PLT 266 253 246    Basic Metabolic Panel: Recent Labs  Lab 03/10/23 1900 03/11/23 0022 03/11/23 0432  NA 138  --  137  K 4.1  --  3.6  CL 97*  --  103  CO2 25  --  24  GLUCOSE 139*  --  123*  BUN 17  --  13  CREATININE 1.09* 0.95 1.03*  CALCIUM 9.8  --  8.8*  MG 2.1  --  1.9  PHOS  --   --  3.5    GFR: Estimated Creatinine Clearance: 45.1 mL/min (A) (by C-G formula based on SCr of 1.03 mg/dL (H)). Liver Function Tests: Recent Labs  Lab 03/10/23 1900  AST 24  ALT 17  ALKPHOS 89  BILITOT 0.3  PROT 7.3  ALBUMIN 3.6    No results for input(s): "LIPASE", "AMYLASE" in the last  168 hours. Recent Labs  Lab 03/10/23 1900  AMMONIA 15    Coagulation Profile: No results for input(s): "INR", "PROTIME" in the last 168 hours. Cardiac Enzymes: No results for input(s): "CKTOTAL", "CKMB", "CKMBINDEX", "TROPONINI" in the last 168 hours. BNP (last 3 results) No results for input(s): "PROBNP" in the last 8760 hours. HbA1C: No results for input(s): "HGBA1C" in the last 72 hours. CBG: Recent Labs  Lab 03/11/23 0829 03/11/23 1225 03/11/23 1627 03/12/23 0851  GLUCAP 108* 100* 103* 91    Lipid Profile: No results for input(s): "CHOL", "HDL", "LDLCALC", "TRIG", "CHOLHDL", "LDLDIRECT" in the last 72 hours. Thyroid Function Tests: Recent Labs    03/10/23 1900  TSH 5.269*    Anemia Panel: No results for input(s): "VITAMINB12", "FOLATE", "FERRITIN", "TIBC", "IRON", "RETICCTPCT" in the last 72 hours. Urine analysis:    Component Value Date/Time   COLORURINE YELLOW 03/11/2023 1047   APPEARANCEUR HAZY (A) 03/11/2023 1047   LABSPEC 1.013 03/11/2023 1047   PHURINE 5.0 03/11/2023 1047   GLUCOSEU NEGATIVE 03/11/2023 1047   HGBUR NEGATIVE 03/11/2023 1047   BILIRUBINUR NEGATIVE 03/11/2023 1047   KETONESUR NEGATIVE 03/11/2023 1047   PROTEINUR NEGATIVE 03/11/2023 1047   NITRITE NEGATIVE  04-05-2023 1047   LEUKOCYTESUR LARGE (A) 05-Apr-2023 1047   Sepsis Labs: @LABRCNTIP (procalcitonin:4,lacticidven:4)  )No results found for this or any previous visit (from the past 240 hour(s)).       Radiology Studies: EEG adult  Result Date: 2023-04-05 Charlsie Quest, MD     April 05, 2023  6:39 AM Patient Name: Analysse Borucki MRN: [id number] Epilepsy Attending: Charlsie Quest Referring Physician/Provider: Tegeler, Canary Brim, MD Date: 04/05/2023 Duration: 25.36 mins Patient history:  82 y.o. female with medical history significant for skin cancer, remote history of epilepsy without recurrence since menopause, not on AEDs, hyperlipidemia, hypertension, obesity, CKD 3A, who  presents to the ED from her independent living facility, after an episode of unresponsiveness.  EEG to evaluate for seizure. Level of alertness: Awake, asleep AEDs during EEG study: None Technical aspects: This EEG study was done with scalp electrodes positioned according to the 10-20 International system of electrode placement. Electrical activity was reviewed with band pass filter of 1-70Hz , sensitivity of 7 uV/mm, display speed of 39mm/sec with a 60Hz  notched filter applied as appropriate. EEG data were recorded continuously and digitally stored.  Video monitoring was available and reviewed as appropriate. Description: The posterior dominant rhythm consists of 8-9 Hz activity of moderate voltage (25-35 uV) seen predominantly in posterior head regions, symmetric and reactive to eye opening and eye closing. Sleep was characterized by vertex waves, sleep spindles (12 to 14 Hz), maximal frontocentral region.  EEG showed intermittent generalized 3 to 6 Hz theta-delta slowing. Photic stimulation and hyperventilation were not performed.    ABNORMALITY - Intermittent slow, generalized  IMPRESSION: This study is suggestive of mild diffuse encephalopathy, nonspecific etiology.  No seizures or epileptiform discharges were seen throughout the recording.  Priyanka Annabelle Harman    CT HEAD WO CONTRAST ( )  Result Date: 03/10/2023 CLINICAL DATA:  Syncope/presyncope EXAM: CT HEAD WITHOUT CONTRAST TECHNIQUE: Contiguous axial images were obtained from the base of the skull through the vertex without intravenous contrast. RADIATION DOSE REDUCTION: This exam was performed according to the departmental dose-optimization program which includes automated exposure control, adjustment of the mA and/or kV according to patient size and/or use of iterative reconstruction technique. COMPARISON:  01/27/2023 FINDINGS: Brain: No evidence of acute infarction, hemorrhage, hydrocephalus, extra-axial collection or mass lesion/mass effect.  Subcortical white matter and periventricular small vessel ischemic changes. Old right basal ganglia lacunar infarct. Vascular: No hyperdense vessel or unexpected calcification. Skull: Normal. Negative for fracture or focal lesion. Sinuses/Orbits: The visualized paranasal sinuses are essentially clear. The mastoid air cells are unopacified. Other: None. IMPRESSION: No acute intracranial abnormality. Small vessel ischemic changes. Old right basal ganglia lacunar infarct. Electronically Signed   By: Charline Bills M.D.   On: 03/10/2023 19:45   DG Chest Portable 1 View  Result Date: 03/10/2023 CLINICAL DATA:  Seizures, evaluate for aspiration EXAM: PORTABLE CHEST 1 VIEW COMPARISON:  Previous studies including the examination of 01/27/2023 FINDINGS: Transverse diameter of heart is increased. There are no signs of pulmonary edema or focal pulmonary consolidation. Small linear densities in left lower lung field may suggest scarring or subsegmental atelectasis. There is no pleural effusion or pneumothorax. Degenerative changes are noted in the left shoulder. IMPRESSION: Cardiomegaly. There are no signs of pulmonary edema or new focal pulmonary consolidation. Small linear densities in the lateral aspect of left lower lung field may suggest scarring or subsegmental atelectasis. Electronically Signed   By: Ernie Avena M.D.   On: 03/10/2023 19:27        Scheduled  Meds:  enoxaparin (LOVENOX) injection  40 mg Subcutaneous Q24H   insulin aspart  0-5 Units Subcutaneous QHS   insulin aspart  0-9 Units Subcutaneous TID WC   zonisamide  100 mg Oral Daily   Continuous Infusions:  ampicillin-sulbactam (UNASYN) IV 3 g (03/12/23 0159)     LOS: 2 days    Time spent: 35 minutes.      Berton Mount, MD  [facility] Pager #: [phone] 7PM-7AM contact night coverage as above

## 2023-03-12 NOTE — Plan of Care (Signed)

## 2023-03-12 NOTE — Progress Notes (Signed)
No further episodes concerning for seizure.  I discussed with her daughter and with the patient, and they are agreeable with her restarting zonisamide which was a medication that she had been on previously.  She has a history of catamenial epilepsy but did continue having seizures after menopause, and went off of zonisamide due to nonspecific symptoms that did not improve after stopping zonisamide.  She was on a relatively low-dose previously, but given reported that it was helping I would favor restarting at this dose given her history of intolerance to multiple other medications.  I would favor restarting zonisamide whether or not these most recent episodes were seizure, but given that she is wearing a Zio patch I would favor trying to find out if there were any arrhythmias associated with the most recent event.  From a neurological perspective, however, the next step would be the same either way, restarting zonisamide 100 mg daily. this is ordered.  I will also place a referral so that she can be established with an outpatient neurologist as I feel that she would benefit from longitudinal neurology follow-up.  No further acute inpatient recommendations, please call if further questions or concerns.  Ritta Slot, MD [facility] [phone]  If 7pm- 7am, please page neurology on call as listed in AMION.

## 2023-03-12 NOTE — Progress Notes (Signed)
Received request Dr. Dartha Lodge to see if we could get the results of this patient's monitor as she presented with recurrent episode of loss of consciousness that occurred around 6pm on 03/10/23. Notes indicate she is wearing a [employer]. Since this is active monitoring, [employer] typically notifies our office urgently if there are any concerning arrhythmias and I do not see any phone note/notifications in the chart. I tried to call the [employer] but both the regular 888 number and direct after hours line both were closed, requesting to leave a message so that someone can return them the following business day. I sent a message to our cardmaster inbox to have one of our APPs reach out to our monitor team at our office tomorrow when they re-open to confirm no new episodes to coincide with the event. I asked them to please relay results to primary team at which time it can be decided if further cardiology involvement is needed.

## 2023-03-13 LAB — HEMOGLOBIN A1C
Hgb A1c MFr Bld: 6.3 % — ABNORMAL HIGH (ref 4.8–5.6)
Mean Plasma Glucose: 134 mg/dL

## 2023-03-13 LAB — GLUCOSE, CAPILLARY: Glucose-Capillary: 86 mg/dL (ref 70–99)

## 2023-03-13 MED ORDER — ZONISAMIDE 100 MG PO CAPS: 100.0000 mg | ORAL_CAPSULE | Freq: Every day | ORAL | 1 refills | Status: DC

## 2023-03-13 NOTE — Care Management Important Message (Signed)
Important Message  Patient Details  Name: Katherine Obrien MRN: [id number] Date of Birth: 1940/10/31   Medicare Important Message Given:  Yes     Dorena Bodo 03/13/2023, 2:14 PM

## 2023-03-13 NOTE — Discharge Summary (Signed)
Physician Discharge Summary  Patient ID: Katherine Obrien MRN: [id number] DOB/AGE: 1941/04/18 82 y.o.  Admit date: 03/10/2023 Discharge date: 03/13/2023  Admission Diagnoses:  Discharge Diagnoses:  Principal Problem:   Episode of unresponsiveness   Discharged Condition: stable  Hospital Course:  Patient is an 82 year old female with past medical history significant for epilepsy (currently not on antiseizure medication), skin cancer, hyperlipidemia, colonic polyp and hypertension.  According to the patient, she was on antiseizure medication and not now since age 65s, and the medications were discontinued about 2 years ago.  According to the patient, the reason for discontinuation of antiseizure medication was that the seizure stopped.  Patient was admitted with episode of unresponsiveness, worrisome for possible seizure.  Patient had similar episode of unresponsiveness about 29-month ago, and was discharged home with placement of heart monitor.  Heart monitor was reviewed by the cardiology team and only mild tachycardia was noted (not enough to explain episodes of unresponsiveness).  Neurology was consulted to assist with patient's management.  EEG revealed mild diffuse encephalopathy, without seizures or epileptiform discharges.  Neurology team has restarted patient's antiseizure medication.  Patient was treated with IV Unasyn, considering risk of aspiration pneumonia.  However, chest x-ray was nonrevealing.  Troponins were negative.  No further episodes of unresponsiveness were noted during the hospital stay.  Patient be discharged back onto the care of the primary care provider and the neurology team.  Episode of unresponsiveness, seizure versus syncope. -See above documentation. -Patient was on antiseizure medication on and off for about 40 years, but this was discontinued about 2 years ago.  Patient tells me that the reason for discontinuation of antiseizure medication was that the seizure  stopped. -Episodes of unresponsiveness.  Concerns for possible seizure.  Patient also had similar episode last month. -Patient could not or would not describe any details. -EEG was negative for seizure or epileptiform discharges. -Neurology input is appreciated, zonisamide has been restarted. -Patient has telemetry monitoring placed about a month ago that only revealed mild tachycardia..   Concern for aspiration -As per prior documentation: "Choking on her meal during episode of unresponsiveness" -Initially treated with IV Unasyn. -Procalcitonin was less than 0.1. -Antibiotics were discontinued.    Acute hypoxic respiratory failure: -Likely from aspiration versus seizure activity.  -Currently on 2 L [place] and maintaining her saturation -Wean off O2 supplementation as tolerated. -Maintain O2 saturation above 92% -Aspiration precautions.     Prediabetes Presented with serum glucose 139. Last hemoglobin A1c 6.4 on 10/04/2022 Started insulin sliding scale for hyperglycemia. Gentle IV fluid hydration LR 50 cc/h x 1 day. A1c was 6.2%.     Essential hypertension -Continue to monitor closely. -Goal blood pressure should be less than 130/80 mmHg. -Avoid excessive drops in blood pressure. -Keep MAP greater than 65 mmHg. -Lisinopril 10 Mg p.o. once daily.     CKD 3A -Likely secondary to hypertension. -Renal function is at baseline. -Avoid nephrotoxins. -Will defer decision to start SGLT2 inhibitor today primary care team and outpatient nephrology team.   Obesity BMI 34 Diet and exercise. Follow A1c. Consider subcutaneous semaglutide or Mounjaro if not contraindicated.      Consults:  -Neurology.  -Cardiology: Discussed with the cardiology team extensively.  Patient was wearing a heart monitor at the time of event.  Heart monitor was reviewed by the cardiology team and mild tachycardia was noted at the time.  Mild tachycardia would not explain patient's  unresponsiveness.  Significant Diagnostic Studies:  EEG revealed: "This study is suggestive of  mild diffuse encephalopathy, nonspecific etiology. No seizures or epileptiform discharges were seen throughout the recording.   Treatments: Patient was re-started on zonisamide 100 Mg p.o. once daily.  Discharge Exam: Blood pressure (!) 118/57, pulse 70, temperature 97.8 F (36.6 C), temperature source Oral, resp. rate 18, height 5\' 4"  (1.626 m), weight 90.7 kg, SpO2 94 %.   Disposition: Discharge disposition: 01-Home or Self Care       Discharge Instructions     Ambulatory referral to Neurology   Complete by: As directed    An appointment is requested in approximately: 2-4 weeks   Diet - low sodium heart healthy   Complete by: As directed    Diet Carb Modified   Complete by: As directed    Increase activity slowly   Complete by: As directed       Allergies as of 03/13/2023       Reactions   Barbiturates Other (See Comments)   A small amount "overdosed" the patient- "affected my stability and balance" (might have been given at the same as another med?)   Cefdinir Other (See Comments)   A small amount "overdosed" the patient- "affected my stability and balance" (might have been given at the same as another med?)   Codeine Other (See Comments)   "Moderate," per pharmacy   Phenobarbital Other (See Comments)   A small amount "overdosed" the patient- "affected my stability and balance" (might have been given at the same as another med?)   Tegretol [carbamazepine] Other (See Comments)   A small amount "overdosed" the patient- "affected my stability and balance" (might have been given at the same as another med?)   Tramadol Other (See Comments)   "Moderate," per pharmacy   Zonisamide Other (See Comments)   "Negative reaction" (Zonegran)        Medication List     STOP taking these medications    ammonium lactate 12 % lotion Commonly known as: LAC-HYDRIN   GoodSense  Mucus ER 600 MG 12 hr tablet Generic drug: guaiFENesin       TAKE these medications    ketoconazole 2 % shampoo Commonly known as: NIZORAL Apply 1 application topically once a week.   lisinopril 10 MG tablet Commonly known as: ZESTRIL Take 1 tablet (10 mg total) by mouth daily.   One-A-Day Womens 50 Plus Tabs Take 1 tablet by mouth daily with breakfast. What changed: Another medication with the same name was removed. Continue taking this medication, and follow the directions you see here.   Systane 0.4-0.3 % Soln Generic drug: Polyethyl Glycol-Propyl Glycol Place 1-2 drops into both eyes 3 (three) times daily as needed (for dryness).   Vitamin D-3 25 MCG (1000 UT) Caps TAKE 1 CAPSULE DAILY WITH BREAKFAST. What changed: See the new instructions.   zonisamide 100 MG capsule Commonly known as: ZONEGRAN Take 1 capsule (100 mg total) by mouth daily.        Time spent: 35 minutes.  SignedBarnetta Chapel 03/13/2023, 1:42 PM

## 2023-03-13 NOTE — Progress Notes (Signed)
Transition of Care [facility]) - Inpatient Brief Assessment   Patient Details  Name: Katherine Obrien MRN: [id number] Date of Birth: 1941-09-10  Transition of [facility]) CM/SW Contact:    Janae Bridgeman, RN Phone Number: 03/13/2023, 11:25 AM   Clinical Narrative: CM met with the patient prior to discharge to home by bedside nursing.  No TOC needs.  The patient's plans to call [facility] to assist with transportation home - she has number to contact.  No TOC needs.   Transition of Care Asessment: Insurance and Status: (P) Insurance coverage has been reviewed Patient has primary care physician: (P) Yes Home environment has been reviewed: (P) Patient lives at [facility] ILF Prior level of function:: (P) lives alone at an ILF apartment at [facility] Prior/Current Home Services: (P) No current home services Social Determinants of Health Reivew: (P) SDOH reviewed no interventions necessary Readmission risk has been reviewed: (P) Yes Transition of care needs: (P) no transition of care needs at this time

## 2023-03-14 ENCOUNTER — Telehealth: Payer: Self-pay

## 2023-03-14 ENCOUNTER — Ambulatory Visit: Payer: Medicare Other | Attending: Internal Medicine

## 2023-03-14 DIAGNOSIS — R55 Syncope and collapse: Secondary | ICD-10-CM

## 2023-03-14 LAB — HEMOGLOBIN A1C
Hgb A1c MFr Bld: 6.2 % — ABNORMAL HIGH (ref 4.8–5.6)
Mean Plasma Glucose: 131 mg/dL

## 2023-03-14 NOTE — Transitions of Care (Post Inpatient/ED Visit) (Signed)
   03/14/2023  Name: Katherine Obrien MRN: [id number] DOB: 23-Nov-1940  Today's TOC FU Call Status:    Attempted to reach the patient regarding the most recent Inpatient/ED visit.  Follow Up Plan: Additional outreach attempts will be made to reach the patient to complete the Transitions of Care (Post Inpatient/ED visit) call.   Signature : [facility]

## 2023-03-15 ENCOUNTER — Ambulatory Visit (INDEPENDENT_AMBULATORY_CARE_PROVIDER_SITE_OTHER): Payer: Medicare Other | Admitting: Family Medicine

## 2023-03-15 ENCOUNTER — Encounter: Payer: Self-pay | Admitting: Family Medicine

## 2023-03-15 VITALS — BP 120/74 | HR 80 | Ht 64.0 in | Wt 202.2 lb

## 2023-03-15 DIAGNOSIS — R278 Other lack of coordination: Secondary | ICD-10-CM | POA: Diagnosis not present

## 2023-03-15 DIAGNOSIS — R262 Difficulty in walking, not elsewhere classified: Secondary | ICD-10-CM | POA: Diagnosis not present

## 2023-03-15 DIAGNOSIS — G40909 Epilepsy, unspecified, not intractable, without status epilepticus: Secondary | ICD-10-CM | POA: Diagnosis not present

## 2023-03-15 DIAGNOSIS — J690 Pneumonitis due to inhalation of food and vomit: Secondary | ICD-10-CM | POA: Diagnosis not present

## 2023-03-15 DIAGNOSIS — I10 Essential (primary) hypertension: Secondary | ICD-10-CM | POA: Diagnosis not present

## 2023-03-15 DIAGNOSIS — R413 Other amnesia: Secondary | ICD-10-CM

## 2023-03-15 DIAGNOSIS — R41841 Cognitive communication deficit: Secondary | ICD-10-CM | POA: Diagnosis not present

## 2023-03-15 DIAGNOSIS — M6281 Muscle weakness (generalized): Secondary | ICD-10-CM | POA: Diagnosis not present

## 2023-03-15 NOTE — Progress Notes (Signed)
Provider:  Jacalyn Lefevre, MD  Careteam: Patient Care Team: Mast, Man X, NP as PCP - General (Internal Medicine) Solmon Ice, MD as Referring Physician (Neurology) Shela Commons, Kelton Pillar, MD as Referring Physician (Dermatology)  PLACE OF SERVICE:  [facility]  Advanced Directive information    Allergies  Allergen Reactions   Barbiturates Other (See Comments)    A small amount "overdosed" the patient- "affected my stability and balance" (might have been given at the same as another med?)   Cefdinir Other (See Comments)    A small amount "overdosed" the patient- "affected my stability and balance" (might have been given at the same as another med?)   Codeine Other (See Comments)    "Moderate," per pharmacy   Phenobarbital Other (See Comments)    A small amount "overdosed" the patient- "affected my stability and balance" (might have been given at the same as another med?)   Tegretol [Carbamazepine] Other (See Comments)    A small amount "overdosed" the patient- "affected my stability and balance" (might have been given at the same as another med?)   Tramadol Other (See Comments)    "Moderate," per pharmacy   Zonisamide Other (See Comments)    "Negative reaction" (Zonegran)    Chief Complaint  Patient presents with   Hospitalization Follow-up    Patient presents today for a hospital follow-up. She was admitted into [facility] hospital on 6/28-03/13/23 for unresponsiveness        HPI: Patient is a 82 y.o. female .  Patient has had 3 visits to the hospital over the last 6 months for seizure disorder aspiration and syncope.  She has had 3 negative EEGs as well as negative CT scan.  She does have a remote history of seizure disorder but has been off of anticonvulsants for many years.  She was seen in the hospital by neurology and started on zonisamide 100 mg daily; he does not however have a follow-up appointment with neurology. During the evaluation was also seen by  cardiology and had a monitor worn for several days without any arrhythmias that may have led to the syncopal episode.  Review of Systems:  Review of Systems  Constitutional: Negative.   Respiratory: Negative.    Cardiovascular: Negative.   Neurological:  Positive for seizures.  Psychiatric/Behavioral: Negative.    All other systems reviewed and are negative.   Past Medical History:  Diagnosis Date   Arthritis    Cancer [facility])    skin   Cataract 2010   Dr. Luretha Rued, Dr.. Romie Minus @ [facility]   Epilepsy [facility]) 08/11/2015   Hyperlipidemia 12/08/2015   Hypertension    Polyp of colon 08/11/2015   Past Surgical History:  Procedure Laterality Date   ABDOMINAL HYSTERECTOMY  1970   APPENDECTOMY  1958   BREAST SURGERY  1984   COLON SURGERY  09/12/2006   FEMUR SURGERY  2018   Broken   TONSILLECTOMY  09/12/1944   Social History:   reports that she has never smoked. She has never used smokeless tobacco. She reports that she does not drink alcohol and does not use drugs.  Family History  Problem Relation Age of Onset   Osteoporosis Mother    Dementia Mother    Arthritis Mother    Stroke Father 67       mini   Dementia Father    CVA Father    Breast cancer Sister    Cancer Sister        breast  Dementia Sister        severe   Alzheimer's disease Sister    Diabetes Sister    Hypertension Sister    Stroke Paternal Grandfather        1970    Medications: Patient's Medications  New Prescriptions   No medications on file  Previous Medications   CHOLECALCIFEROL (VITAMIN D-3) 25 MCG (1000 UT) CAPS    TAKE 1 CAPSULE DAILY WITH BREAKFAST.   KETOCONAZOLE (NIZORAL) 2 % SHAMPOO    Apply 1 application topically once a week.    LISINOPRIL (ZESTRIL) 10 MG TABLET    Take 1 tablet (10 mg total) by mouth daily.   MULTIPLE VITAMINS-MINERALS (ONE-A-DAY WOMENS 50 PLUS) TABS    Take 1 tablet by mouth daily with breakfast.   MULTIPLE VITAMINS-MINERALS (PRESERVISION AREDS 2) CAPS    Take 2 capsules by  mouth daily.   POLYETHYL GLYCOL-PROPYL GLYCOL (SYSTANE) 0.4-0.3 % SOLN    Place 1-2 drops into both eyes 3 (three) times daily as needed (for dryness).    ZONISAMIDE (ZONEGRAN) 100 MG CAPSULE    Take 1 capsule (100 mg total) by mouth daily.  Modified Medications   No medications on file  Discontinued Medications   No medications on file    Physical Exam:  Vitals:   03/15/23 1530  BP: 120/74  Pulse: 80  SpO2: 97%  Weight: 202 lb 3.2 oz (91.7 kg)  Height: 5\' 4"  (1.626 m)   Body mass index is 34.71 kg/m. Wt Readings from Last 3 Encounters:  03/15/23 202 lb 3.2 oz (91.7 kg)  03/10/23 200 lb (90.7 kg)  02/02/23 207 lb (93.9 kg)    Physical Exam Vitals and nursing note reviewed.  Constitutional:      Appearance: Normal appearance.  Cardiovascular:     Rate and Rhythm: Normal rate and regular rhythm.  Pulmonary:     Effort: Pulmonary effort is normal.     Breath sounds: Normal breath sounds.  Neurological:     General: No focal deficit present.     Mental Status: She is alert.     Comments: Many mental status exam reveals that she can remember 2 words out of 3 at 5 minutes but easily counts backwards from 100 by sevens.     Labs reviewed: Basic Metabolic Panel: Recent Labs    01/27/23 1510 01/28/23 0043 01/31/23 0303 02/14/23 0734 03/10/23 1900 03/11/23 0022 03/11/23 0432  NA  --    < > 136 138 138  --  137  K  --    < > 3.2* 4.4 4.1  --  3.6  CL  --    < > 101 102 97*  --  103  CO2  --    < > 26 27 25   --  24  GLUCOSE  --    < > 134* 102* 139*  --  123*  BUN  --    < > 15 23 17   --  13  CREATININE  --    < > 1.12* 0.96* 1.09* 0.95 1.03*  CALCIUM  --    < > 8.3* 9.7 9.8  --  8.8*  MG  --    < > 1.9  --  2.1  --  1.9  PHOS  --   --   --   --   --   --  3.5  TSH 4.566*  --   --   --  5.269*  --   --    < > =  values in this interval not displayed.   Liver Function Tests: Recent Labs    03/24/22 1710 10/04/22 0734 01/27/23 1510 02/14/23 0734  03/10/23 1900  AST 33   < > 22 16 24   ALT 21   < > 20 20 17   ALKPHOS 82  --  70  --  89  BILITOT 0.4   < > 0.2* 0.5 0.3  PROT 6.9   < > 7.0 7.0 7.3  ALBUMIN 3.5  --  3.5  --  3.6   < > = values in this interval not displayed.   Recent Labs    01/27/23 1510  LIPASE 57*   Recent Labs    03/10/23 1900  AMMONIA 15   CBC: Recent Labs    04/12/22 0745 01/27/23 1353 02/14/23 0734 03/10/23 1900 03/11/23 0022 03/11/23 0432  WBC 9.3   < > 8.4 12.0* 12.3* 11.8*  NEUTROABS 5,413  --  4,427 8.7*  --   --   HGB 13.2   < > 12.8 12.7 12.2 11.7*  HCT 40.7   < > 38.8 40.4 38.5 36.9  MCV 86.8   < > 85.5 89.2 88.7 89.1  PLT 300   < > 371 266 253 246   < > = values in this interval not displayed.   Lipid Panel: Recent Labs    10/04/22 0734  CHOL 202*  HDL 52  LDLCALC 125*  TRIG 136  CHOLHDL 3.9   TSH: Recent Labs    01/27/23 1510 03/10/23 1900  TSH 4.566* 5.269*   A1C: Lab Results  Component Value Date   HGBA1C 6.2 (H) 03/13/2023     Assessment/Plan  1. Aspiration pneumonia of left lower lobe due to vomit (HCC) Likely related to seizure; we did with Unasyn  2. Essential hypertension Blood pressure controlled with the single agent, lisinopril 10 mg  3. Memory deficit Memory deficit is mild.  I do not think her presentation is consistent with dementia but more related to age     61. Seizure disorder (HCC) Continue with zonisamide.  Arrange follow-up with neurology.  I helped her set timer on her smart phone to remind her to take her prescription medicines namely zonisamide, lisinopril, and AREDS 2 every day at 8 AM  Jacalyn Lefevre, MD [facility] [phone]

## 2023-03-17 DIAGNOSIS — R278 Other lack of coordination: Secondary | ICD-10-CM | POA: Diagnosis not present

## 2023-03-17 DIAGNOSIS — R41841 Cognitive communication deficit: Secondary | ICD-10-CM | POA: Diagnosis not present

## 2023-03-17 DIAGNOSIS — M6281 Muscle weakness (generalized): Secondary | ICD-10-CM | POA: Diagnosis not present

## 2023-03-17 DIAGNOSIS — R262 Difficulty in walking, not elsewhere classified: Secondary | ICD-10-CM | POA: Diagnosis not present

## 2023-03-20 DIAGNOSIS — M6281 Muscle weakness (generalized): Secondary | ICD-10-CM | POA: Diagnosis not present

## 2023-03-20 DIAGNOSIS — R41841 Cognitive communication deficit: Secondary | ICD-10-CM | POA: Diagnosis not present

## 2023-03-20 DIAGNOSIS — R278 Other lack of coordination: Secondary | ICD-10-CM | POA: Diagnosis not present

## 2023-03-20 DIAGNOSIS — R262 Difficulty in walking, not elsewhere classified: Secondary | ICD-10-CM | POA: Diagnosis not present

## 2023-03-22 DIAGNOSIS — R262 Difficulty in walking, not elsewhere classified: Secondary | ICD-10-CM | POA: Diagnosis not present

## 2023-03-22 DIAGNOSIS — M6281 Muscle weakness (generalized): Secondary | ICD-10-CM | POA: Diagnosis not present

## 2023-03-22 DIAGNOSIS — R41841 Cognitive communication deficit: Secondary | ICD-10-CM | POA: Diagnosis not present

## 2023-03-22 DIAGNOSIS — R278 Other lack of coordination: Secondary | ICD-10-CM | POA: Diagnosis not present

## 2023-03-24 DIAGNOSIS — R262 Difficulty in walking, not elsewhere classified: Secondary | ICD-10-CM | POA: Diagnosis not present

## 2023-03-24 DIAGNOSIS — R41841 Cognitive communication deficit: Secondary | ICD-10-CM | POA: Diagnosis not present

## 2023-03-24 DIAGNOSIS — R278 Other lack of coordination: Secondary | ICD-10-CM | POA: Diagnosis not present

## 2023-03-24 DIAGNOSIS — M6281 Muscle weakness (generalized): Secondary | ICD-10-CM | POA: Diagnosis not present

## 2023-03-28 ENCOUNTER — Ambulatory Visit (INDEPENDENT_AMBULATORY_CARE_PROVIDER_SITE_OTHER): Payer: Medicare Other | Admitting: Neurology

## 2023-03-28 ENCOUNTER — Encounter: Payer: Self-pay | Admitting: Neurology

## 2023-03-28 VITALS — BP 134/80 | HR 84 | Ht 63.0 in | Wt 200.0 lb

## 2023-03-28 DIAGNOSIS — G40909 Epilepsy, unspecified, not intractable, without status epilepticus: Secondary | ICD-10-CM

## 2023-03-28 MED ORDER — ZONISAMIDE 100 MG PO CAPS: 100.0000 mg | ORAL_CAPSULE | Freq: Every evening | ORAL | 5 refills | Status: DC

## 2023-03-28 NOTE — Patient Instructions (Signed)
Continue zonisamide 100 mg nightly, refill given Continue your other medications Follow-up in 6 months or sooner if worse

## 2023-03-28 NOTE — Progress Notes (Signed)
[facility]  PATIENT: Katherine Obrien DOB: 26-Dec-1940  REQUESTING CLINICIAN: Rejeana Brock,* HISTORY FROM: Patient  REASON FOR VISIT: Seizure like activity/Period of unresponsiveness    HISTORICAL  CHIEF COMPLAINT:  Chief Complaint  Patient presents with   New Patient (Initial Visit)    Rm 13, alone, NP, patient was eating dinner with family and blacked out, body became numb, this has happened multiple times in the past. Had grand mal seizures but stopped at menopause, also stopped taking zonisamide and now taking. Patient feels it is more blood sugar related but feels she is never listen too.    INTERVAL HISTORY 03/28/2023: Patient presents for follow-up, last visit was in September 2023 since that time she had two to 3 additional visits to the hospital due to episode of unresponsiveness concerning for seizure.  At her last episode in June, plan was to restart zonisamide 100 mg at night since she was on that medication previously.  Her EEG was done while admitted, no epileptiform discharges but show diffuse slowing.  Currently she denies any side effect from the zonisamide and is compliant with the medication.    Most recent discharge summary 03/13/2023 Hospital Course:  Patient is an 82 year old female with past medical history significant for epilepsy (currently not on antiseizure medication), skin cancer, hyperlipidemia, colonic polyp and hypertension.  According to the patient, she was on antiseizure medication and not now since age 12s, and the medications were discontinued about 2 years ago.  According to the patient, the reason for discontinuation of antiseizure medication was that the seizure stopped.  Patient was admitted with episode of unresponsiveness, worrisome for possible seizure.  Patient had similar episode of unresponsiveness about 47-month ago, and was discharged home with placement of heart monitor.  Heart monitor was reviewed by the  cardiology team and only mild tachycardia was noted (not enough to explain episodes of unresponsiveness).  Neurology was consulted to assist with patient's management.  EEG revealed mild diffuse encephalopathy, without seizures or epileptiform discharges.  Neurology team has restarted patient's antiseizure medication.  Patient was treated with IV Unasyn, considering risk of aspiration pneumonia.  However, chest x-ray was nonrevealing.  Troponins were negative.  No further episodes of unresponsiveness were noted during the hospital stay.  Patient be discharged back onto the care of the primary care provider and the neurology team.   Episode of unresponsiveness, seizure versus syncope. -See above documentation. -Patient was on antiseizure medication on and off for about 40 years, but this was discontinued about 2 years ago.  Patient tells me that the reason for discontinuation of antiseizure medication was that the seizure stopped. -Episodes of unresponsiveness.  Concerns for possible seizure.  Patient also had similar episode last month. -Patient could not or would not describe any details. -EEG was negative for seizure or epileptiform discharges. -Neurology input is appreciated, zonisamide has been restarted. -Patient has telemetry monitoring placed about a month ago that only revealed mild tachycardia    HISTORY OF PRESENT ILLNESS:  This is a 82 year old woman past medical history of seizures in her 30s, hypertension, hyperlipidemia who is presenting after an episode of altered mental status/syncope/unresponsiveness.  Patient reports on July 13, she was heading to the dining room, at that time she noted that she was very hungry, she sat on the table and did blackout.  She states she was unresponsive and when someone was asking her a question, she was aware but was not able to answer.  Due to that  reason she was taken to the hospital.  In the ED work-up was negative. She does report a history of  seizures that have started in the 30s after starting a birth control pill.  Patient report having seizures until menopause and since then she has not had seizures, her last seizure was this more than 30 years.  She denies any injury from that incident, and since then has not reported any additional history. She does report a history of syncope when she is hungry or at the site of blood, likely vasovagal in the past.     Handedness: Right   Onset: In her 30s   Seizure Type: used to have generalized but stopped after menopause   Current frequency: Last reported seizure after menopause  Any injuries from seizures: Denies   Seizure risk factors: Patient reports that seizures were due to birth control pills that contains the highest amount of estrogen   Previous ASMs: Phenobarb, barbiturate, Carbamazepine   Currenty ASMs: None   ASMs side effects: N/A   Brain Images: Normal head CT   Previous EEGs:    OTHER MEDICAL CONDITIONS: History of seizures, Hypertension, hyperlipidemia,   REVIEW OF SYSTEMS: Full 14 system review of systems performed and negative with exception of: As noted in the HPI   ALLERGIES: Allergies  Allergen Reactions   Barbiturates Other (See Comments)    A small amount "overdosed" the patient- "affected my stability and balance" (might have been given at the same as another med?)   Cefdinir Other (See Comments)    A small amount "overdosed" the patient- "affected my stability and balance" (might have been given at the same as another med?)   Codeine Other (See Comments)    "Moderate," per pharmacy   Phenobarbital Other (See Comments)    A small amount "overdosed" the patient- "affected my stability and balance" (might have been given at the same as another med?)   Tegretol [Carbamazepine] Other (See Comments)    A small amount "overdosed" the patient- "affected my stability and balance" (might have been given at the same as another med?)   Tramadol Other (See  Comments)    "Moderate," per pharmacy   Zonisamide Other (See Comments)    "Negative reaction" (Zonegran)    HOME MEDICATIONS: Outpatient Medications Prior to Visit  Medication Sig Dispense Refill   Cholecalciferol (VITAMIN D-3) 25 MCG (1000 UT) CAPS TAKE 1 CAPSULE DAILY WITH BREAKFAST. (Patient taking differently: Take 1,000 Units by mouth daily with breakfast.) 90 capsule 0   ketoconazole (NIZORAL) 2 % shampoo Apply 1 application topically once a week.      lisinopril (ZESTRIL) 10 MG tablet Take 1 tablet (10 mg total) by mouth daily. 90 tablet 1   Multiple Vitamins-Minerals (ONE-A-DAY WOMENS 50 PLUS) TABS Take 1 tablet by mouth daily with breakfast.     Multiple Vitamins-Minerals (PRESERVISION AREDS 2) CAPS Take 2 capsules by mouth daily.     Polyethyl Glycol-Propyl Glycol (SYSTANE) 0.4-0.3 % SOLN Place 1-2 drops into both eyes 3 (three) times daily as needed (for dryness).      zonisamide (ZONEGRAN) 100 MG capsule Take 1 capsule (100 mg total) by mouth daily. 30 capsule 1   No facility-administered medications prior to visit.    PAST MEDICAL HISTORY: Past Medical History:  Diagnosis Date   Arthritis    Cancer [facility])    skin   Cataract 2010   Dr. Luretha Rued, DrRomie Minus @ [facility]   Epilepsy [facility]) 08/11/2015   Hyperlipidemia 12/08/2015  Hypertension    Hypothyroid    Polyp of colon 08/11/2015    PAST SURGICAL HISTORY: Past Surgical History:  Procedure Laterality Date   ABDOMINAL HYSTERECTOMY  1970   APPENDECTOMY  1958   BREAST SURGERY  1984   COLON SURGERY  09/12/2006   FEMUR SURGERY  2018   Broken   TONSILLECTOMY  09/12/1944    FAMILY HISTORY: Family History  Problem Relation Age of Onset   Osteoporosis Mother    Dementia Mother    Arthritis Mother    Stroke Father 59       mini   Dementia Father    CVA Father    Breast cancer Sister    Cancer Sister        breast   Dementia Sister        severe   Alzheimer's disease Sister    Diabetes Sister    Hypertension  Sister    Stroke Paternal Grandfather        1970    SOCIAL HISTORY: Social History   Socioeconomic History   Marital status: Widowed    Spouse name: Not on file   Number of children: 2   Years of education: Not on file   Highest education level: Not on file  Occupational History   Occupation: Contractor    Comment: retired  Tobacco Use   Smoking status: Never   Smokeless tobacco: Never  Vaping Use   Vaping status: Never Used  Substance and Sexual Activity   Alcohol use: Never   Drug use: Never   Sexual activity: Not Currently  Other Topics Concern   Not on file  Social History Narrative      Right handed   Caffeine- 4 cups      Social History     Socioeconomic History       Marital status: Widowed       Number of children:2       Years of education:  AB in Art [id number])       Highest education level: Bachelors in      Occupational History : Systems developer strain: Not on file       Food insecurity:         Worry: Not on file         Inability: Not on file       Transportation needs:         Medical: Not on file         Non-medical: Not on file     Tobacco Use       Smoking status: Not on file     Substance and Sexual Activity       Alcohol use: Not on file       Drug use: Not on file       Sexual activity: Not on file     Lifestyle       Physical activity:         Days per week: Not on file         Minutes per session: A little       Stress: Not on file     Relationships       Social connections:         Talks on phone: Not on file         Gets together: Not  on file         Attends religious service: Not on file         Active member of club or organization: Not on file         Attends meetings of clubs or organizations: Not on file         Relationship status: Not on file       Intimate partner violence:         Fear of current or ex partner: Not on file         Emotionally abused: Not on  file         Physically abused: Not on file         Forced sexual activity: Not on file     Other Topics       Concerns:         Not on file     Social History Narrative       Not on file      Social Determinants of Health   Financial Resource Strain: Not on file  Food Insecurity: No Food Insecurity (03/11/2023)   Hunger Vital Sign    Worried About Running Out of Food in the Last Year: Never true    Ran Out of Food in the Last Year: Never true  Transportation Needs: No Transportation Needs (03/11/2023)   PRAPARE - Administrator, Civil Service (Medical): No    Lack of Transportation (Non-Medical): No  Physical Activity: Not on file  Stress: Not on file  Social Connections: Not on file  Intimate Partner Violence: Not At Risk (03/11/2023)   Humiliation, Afraid, Rape, and Kick questionnaire    Fear of Current or Ex-Partner: No    Emotionally Abused: No    Physically Abused: No    Sexually Abused: No    PHYSICAL EXAM   GENERAL EXAM/CONSTITUTIONAL: Vitals:  Vitals:   03/28/23 1054  BP: 134/80  Pulse: 84  SpO2: 97%  Weight: 200 lb (90.7 kg)  Height: 5\' 3"  (1.6 m)    Body mass index is 35.43 kg/m. Wt Readings from Last 3 Encounters:  03/28/23 200 lb (90.7 kg)  03/15/23 202 lb 3.2 oz (91.7 kg)  03/10/23 200 lb (90.7 kg)   Patient is in no distress; well developed, nourished and groomed; neck is supple  EYES: Pupils round and reactive to light, Visual fields full to confrontation, Extraocular movements intacts,  No results found.  MUSCULOSKELETAL: Gait, strength, tone, movements noted in Neurologic exam below  NEUROLOGIC: MENTAL STATUS:     11/17/2022    3:39 PM  MMSE - Mini Mental State Exam  Orientation to time 5  Orientation to Place 5  Registration 3  Attention/ Calculation 5  Recall 0  Language- name 2 objects 2  Language- repeat 1  Language- follow 3 step command 3  Language- read & follow direction 1  Write a sentence 1  Copy design 1   Total score 27   awake, alert, oriented to person, place and time recent and remote memory intact normal attention and concentration language fluent, comprehension intact, naming intact fund of knowledge appropriate  CRANIAL NERVE:  2nd, 3rd, 4th, 6th - pupils equal and reactive to light, visual fields full to confrontation, extraocular muscles intact, no nystagmus 5th - facial sensation symmetric 7th - facial strength symmetric 8th - hearing intact 9th - palate elevates symmetrically, uvula midline 11th - shoulder shrug symmetric 12th - tongue protrusion midline  MOTOR:  normal bulk and tone, full strength in the BUE, BLE  SENSORY:  normal and symmetric to light touch  COORDINATION:  finger-nose-finger, fine finger movements normal  REFLEXES:  deep tendon reflexes present and symmetric  GAIT/STATION:  Ambulates with a walker      DIAGNOSTIC DATA (LABS, IMAGING, TESTING) - I reviewed patient records, labs, notes, testing and imaging myself where available.  Lab Results  Component Value Date   WBC 11.8 (H) 03/11/2023   HGB 11.7 (L) 03/11/2023   HCT 36.9 03/11/2023   MCV 89.1 03/11/2023   PLT 246 03/11/2023      Component Value Date/Time   NA 137 03/11/2023 0432   NA 141 10/19/2018 0000   K 3.6 03/11/2023 0432   CL 103 03/11/2023 0432   CL 106 10/19/2018 0000   CO2 24 03/11/2023 0432   CO2 30 10/19/2018 0000   GLUCOSE 123 (H) 03/11/2023 0432   BUN 13 03/11/2023 0432   BUN 13 10/19/2018 0000   CREATININE 1.03 (H) 03/11/2023 0432   CREATININE 0.96 (H) 02/14/2023 0734   CALCIUM 8.8 (L) 03/11/2023 0432   CALCIUM 9.0 10/19/2018 0000   PROT 7.3 03/10/2023 1900   PROT 6.5 10/19/2018 0000   ALBUMIN 3.6 03/10/2023 1900   ALBUMIN 3.7 10/19/2018 0000   AST 24 03/10/2023 1900   ALT 17 03/10/2023 1900   ALKPHOS 89 03/10/2023 1900   BILITOT 0.3 03/10/2023 1900   GFRNONAA 54 (L) 03/11/2023 0432   GFRNONAA 64 10/19/2018 0000   GFRAA >60 11/05/2019 1434   Lab  Results  Component Value Date   CHOL 202 (H) 10/04/2022   HDL 52 10/04/2022   LDLCALC 125 (H) 10/04/2022   TRIG 136 10/04/2022   Lab Results  Component Value Date   HGBA1C 6.2 (H) 03/13/2023   Lab Results  Component Value Date   VITAMINB12 769 10/04/2022   Lab Results  Component Value Date   TSH 5.269 (H) 03/10/2023    Head CT 03/24/22 No acute intracranial abnormality.  Routine EEG 03/11/2023 - Intermittent slow, generalized   I personally reviewed brain Images.   ASSESSMENT AND PLAN  82 y.o. year old female  with history of seizures in her 30s, last seizure more than 30 years ago who is presenting for follow-up after 3 episodes of unresponsiveness, concerning for seizures.  Her EEG was completed, show diffuse slowing but no epileptiform discharges.  Due to her previous history of seizures and her recurrent episodes of unresponsiveness, zonisamide 100 mg was restarted.  She is compliant with the medication, denies any side effect and no additional events.  Will continue patient on zonisamide.  I will see her in 6 months for follow-up or sooner if worse.    1. Seizure disorder [facility])      Patient Instructions  Continue zonisamide 100 mg nightly, refill given Continue your other medications Follow-up in 6 months or sooner if worse    Per [facility] statutes, patients with seizures are not allowed to drive until they have been seizure-free for six months.  Other recommendations include using caution when using heavy equipment or power tools. Avoid working on ladders or at heights. Take showers instead of baths.  Do not swim alone.  Ensure the water temperature is not too high on the home water heater. Do not go swimming alone. Do not lock yourself in a room alone (i.e. bathroom). When caring for infants or small children, sit down when holding, feeding, or changing them to minimize risk  of injury to the child in the event you have a seizure. Maintain good sleep  hygiene. Avoid alcohol.  Also recommend adequate sleep, hydration, good diet and minimize stress.   During the Seizure  - First, ensure adequate ventilation and place patients on the floor on their left side  Loosen clothing around the neck and ensure the airway is patent. If the patient is clenching the teeth, do not force the mouth open with any object as this can cause severe damage - Remove all items from the surrounding that can be hazardous. The patient may be oblivious to what's happening and may not even know what he or she is doing. If the patient is confused and wandering, either gently guide him/her away and block access to outside areas - Reassure the individual and be comforting - Call 911. In most cases, the seizure ends before EMS arrives. However, there are cases when seizures may last over 3 to 5 minutes. Or the individual may have developed breathing difficulties or severe injuries. If a pregnant patient or a person with diabetes develops a seizure, it is prudent to call an ambulance. - Finally, if the patient does not regain full consciousness, then call EMS. Most patients will remain confused for about 45 to 90 minutes after a seizure, so you must use judgment in calling for help. - Avoid restraints but make sure the patient is in a bed with padded side rails - Place the individual in a lateral position with the neck slightly flexed; this will help the saliva drain from the mouth and prevent the tongue from falling backward - Remove all nearby furniture and other hazards from the area - Provide verbal assurance as the individual is regaining consciousness - Provide the patient with privacy if possible - Call for help and start treatment as ordered by the caregiver   After the Seizure (Postictal Stage)  After a seizure, most patients experience confusion, fatigue, muscle pain and/or a headache. Thus, one should permit the individual to sleep. For the next few days, reassurance  is essential. Being calm and helping reorient the person is also of importance.  Most seizures are painless and end spontaneously. Seizures are not harmful to others but can lead to complications such as stress on the lungs, brain and the heart. Individuals with prior lung problems may develop labored breathing and respiratory distress.     No orders of the defined types were placed in this encounter.   Meds ordered this encounter  Medications   zonisamide (ZONEGRAN) 100 MG capsule    Sig: Take 1 capsule (100 mg total) by mouth at bedtime.    Dispense:  30 capsule    Refill:  5    Return in about 6 months (around 09/28/2023).    Windell Norfolk, MD 03/28/2023, 12:04 PM  [facility] [address] [place], [place] [address] [phone]

## 2023-03-29 DIAGNOSIS — M6281 Muscle weakness (generalized): Secondary | ICD-10-CM | POA: Diagnosis not present

## 2023-03-29 DIAGNOSIS — R262 Difficulty in walking, not elsewhere classified: Secondary | ICD-10-CM | POA: Diagnosis not present

## 2023-03-29 DIAGNOSIS — R41841 Cognitive communication deficit: Secondary | ICD-10-CM | POA: Diagnosis not present

## 2023-03-29 DIAGNOSIS — R278 Other lack of coordination: Secondary | ICD-10-CM | POA: Diagnosis not present

## 2023-03-31 DIAGNOSIS — R278 Other lack of coordination: Secondary | ICD-10-CM | POA: Diagnosis not present

## 2023-03-31 DIAGNOSIS — R41841 Cognitive communication deficit: Secondary | ICD-10-CM | POA: Diagnosis not present

## 2023-03-31 DIAGNOSIS — R262 Difficulty in walking, not elsewhere classified: Secondary | ICD-10-CM | POA: Diagnosis not present

## 2023-03-31 DIAGNOSIS — M6281 Muscle weakness (generalized): Secondary | ICD-10-CM | POA: Diagnosis not present

## 2023-04-03 DIAGNOSIS — M6281 Muscle weakness (generalized): Secondary | ICD-10-CM | POA: Diagnosis not present

## 2023-04-03 DIAGNOSIS — R262 Difficulty in walking, not elsewhere classified: Secondary | ICD-10-CM | POA: Diagnosis not present

## 2023-04-03 DIAGNOSIS — R278 Other lack of coordination: Secondary | ICD-10-CM | POA: Diagnosis not present

## 2023-04-03 DIAGNOSIS — R41841 Cognitive communication deficit: Secondary | ICD-10-CM | POA: Diagnosis not present

## 2023-04-05 ENCOUNTER — Ambulatory Visit: Payer: Medicare Other | Admitting: Internal Medicine

## 2023-04-05 ENCOUNTER — Encounter: Payer: Self-pay | Admitting: Internal Medicine

## 2023-04-05 VITALS — BP 122/60 | HR 84 | Ht 64.0 in | Wt 201.4 lb

## 2023-04-05 DIAGNOSIS — G40909 Epilepsy, unspecified, not intractable, without status epilepticus: Secondary | ICD-10-CM | POA: Diagnosis not present

## 2023-04-05 DIAGNOSIS — I361 Nonrheumatic tricuspid (valve) insufficiency: Secondary | ICD-10-CM | POA: Diagnosis not present

## 2023-04-05 DIAGNOSIS — N1831 Chronic kidney disease, stage 3a: Secondary | ICD-10-CM | POA: Insufficient documentation

## 2023-04-05 MED ORDER — FUROSEMIDE 20 MG PO TABS: 20.0000 mg | ORAL_TABLET | Freq: Every day | ORAL | 11 refills | Status: DC

## 2023-04-05 NOTE — Progress Notes (Signed)
Cardiology Office Note:  .    Date:  04/05/2023  ID:  Katherine Obrien, DOB December 22, 1940, MRN [id number] PCP: Mast, Man X, NP  Katherine Obrien Cardiologist:  Katherine Constant, MD     CC: Syncope Consulted for the evaluation of syncope at the behest of MS Mast  History of Present Illness: .    Katherine Obrien is a 82 y.o. female with recent aspiration PNA and hx of seizures.   As part of her work up referred to neurology and cardiology  Patient notes that she is doing ok.   She notes that she blacked out and stopped talking.  Later fallen over.  Tends to happen when she is hungry.  She has a history of seizures that decreased at time of menopause. There are no interval hospital/ED visit.    No chest pain or pressure .  No SOB/DOE and no PND/Orthopnea.  No weight gain or leg swelling.  No palpitations.  And syncope episode with cardiac monitor on- no evidence of cardiac syncope. Notes diarrhea and liquid stools.  Relevant histories: .  Social Lives at Katherine Obrien ROS: As per HPI.   Studies Reviewed: .   Cardiac Studies & Procedures       ECHOCARDIOGRAM  ECHOCARDIOGRAM COMPLETE 01/31/2023  Narrative ECHOCARDIOGRAM REPORT    Patient Name:   Katherine Obrien Date of Exam: 01/31/2023 Medical Rec #:  [id number]          Height:       63.0 in Accession #:    [id number]         Weight:       200.0 lb Date of Birth:  13-Jan-1941         BSA:          1.934 m Patient Age:    81 years           BP:           140/62 mmHg Patient Gender: F                  HR:           77 bpm. Exam Location:  Inpatient  Procedure: 2D Echo, Cardiac Doppler, Color Doppler and Strain Analysis  Indications:    Syncope  History:        Patient has no prior history of Echocardiogram examinations. Risk Factors:Hypertension. CKD, seizure disorder.  Sonographer:    Katherine Obrien Referring Phys: Katherine Obrien   Sonographer Comments: Image acquisition challenging  due to patient body habitus and Image acquisition challenging due to respiratory motion. Global longitudinal strain was attempted. IMPRESSIONS   1. Left ventricular ejection fraction, by estimation, is 60 to 65%. The left ventricle has normal function. The left ventricle has no regional wall motion abnormalities. There is mild concentric left ventricular hypertrophy. Left ventricular diastolic parameters are consistent with Grade I diastolic dysfunction (impaired relaxation). The average left ventricular global longitudinal strain is -23.2 %. The global longitudinal strain is normal. 2. Right ventricular systolic function is normal. The right ventricular size is normal. There is mildly elevated pulmonary artery systolic pressure. The estimated right ventricular systolic pressure is 37.3 mmHg. 3. The mitral valve is normal in structure. No evidence of mitral valve regurgitation. No evidence of mitral stenosis. The mean mitral valve gradient is 3.0 mmHg with average heart rate of 78 bpm. 4. Tricuspid valve regurgitation is moderate. 5. The aortic valve is tricuspid. There is mild calcification  of the aortic valve. There is mild thickening of the aortic valve. Aortic valve regurgitation is not visualized. Aortic valve sclerosis/calcification is present, without any evidence of aortic stenosis. 6. The inferior vena cava is normal in size with greater than 50% respiratory variability, suggesting right atrial pressure of 3 mmHg.  FINDINGS Left Ventricle: Left ventricular ejection fraction, by estimation, is 60 to 65%. The left ventricle has normal function. The left ventricle has no regional wall motion abnormalities. The average left ventricular global longitudinal strain is -23.2 %. The global longitudinal strain is normal. The left ventricular internal cavity size was normal in size. There is mild concentric left ventricular hypertrophy. Left ventricular diastolic parameters are consistent with Grade I  diastolic dysfunction (impaired relaxation). Indeterminate filling pressures.  Right Ventricle: The right ventricular size is normal. No increase in right ventricular wall thickness. Right ventricular systolic function is normal. There is mildly elevated pulmonary artery systolic pressure. The tricuspid regurgitant velocity is 2.93 m/s, and with an assumed right atrial pressure of 3 mmHg, the estimated right ventricular systolic pressure is 37.3 mmHg.  Left Atrium: Left atrial size was normal in size.  Right Atrium: Right atrial size was normal in size.  Pericardium: There is no evidence of pericardial effusion.  Mitral Valve: The mitral valve is normal in structure. Mild to moderate mitral annular calcification. No evidence of mitral valve regurgitation. No evidence of mitral valve stenosis. MV peak gradient, 7.7 mmHg. The mean mitral valve gradient is 3.0 mmHg with average heart rate of 78 bpm.  Tricuspid Valve: The tricuspid valve is normal in structure. Tricuspid valve regurgitation is moderate . No evidence of tricuspid stenosis.  Aortic Valve: The aortic valve is tricuspid. There is mild calcification of the aortic valve. There is mild thickening of the aortic valve. Aortic valve regurgitation is not visualized. Aortic valve sclerosis/calcification is present, without any evidence of aortic stenosis.  Pulmonic Valve: The pulmonic valve was normal in structure. Pulmonic valve regurgitation is not visualized. No evidence of pulmonic stenosis.  Aorta: The aortic root is normal in size and structure.  Venous: The inferior vena cava is normal in size with greater than 50% respiratory variability, suggesting right atrial pressure of 3 mmHg.  IAS/Shunts: No atrial level shunt detected by color flow Doppler.   LEFT VENTRICLE PLAX 2D LVIDd:         3.70 cm     Diastology LVIDs:         2.10 cm     LV e' medial:    6.96 cm/s LV PW:         1.20 cm     LV E/e' medial:  12.8 LV IVS:         1.20 cm     LV e' lateral:   7.40 cm/s LVOT diam:     2.00 cm     LV E/e' lateral: 12.0 LV SV:         77 LV SV Index:   40          2D Longitudinal Strain LVOT Area:     3.14 cm    2D Strain GLS Avg:     -23.2 %  LV Volumes (MOD) LV vol d, MOD A2C: 58.9 ml LV vol d, MOD A4C: 60.8 ml LV vol s, MOD A2C: 22.5 ml LV vol s, MOD A4C: 20.8 ml LV SV MOD A2C:     36.4 ml LV SV MOD A4C:     60.8 ml LV SV  MOD BP:      38.2 ml  RIGHT VENTRICLE             IVC RV S prime:     14.10 cm/s  IVC diam: 2.00 cm TAPSE (M-mode): 2.5 cm  LEFT ATRIUM             Index        RIGHT ATRIUM           Index LA diam:        4.10 cm 2.12 cm/m   RA Area:     12.60 cm LA Vol (A2C):   53.2 ml 27.51 ml/m  RA Volume:   28.90 ml  14.95 ml/m LA Vol (A4C):   49.9 ml 25.81 ml/m LA Biplane Vol: 54.0 ml 27.93 ml/m AORTIC VALVE LVOT Vmax:   113.00 cm/s LVOT Vmean:  81.300 cm/s LVOT VTI:    0.245 m  AORTA Ao Root diam: 3.00 cm Ao Asc diam:  3.50 cm  MITRAL VALVE                TRICUSPID VALVE MV Area (PHT): 2.39 cm     TR Peak grad:   34.3 mmHg MV Area VTI:   2.04 cm     TR Mean grad:   24.0 mmHg MV Peak grad:  7.7 mmHg     TR Vmax:        293.00 cm/s MV Mean grad:  3.0 mmHg     TR Vmean:       236.0 cm/s MV Vmax:       1.39 m/s MV Vmean:      86.1 cm/s    SHUNTS MV Decel Time: 317 msec     Systemic VTI:  0.24 m MR Peak grad: 45.4 mmHg     Systemic Diam: 2.00 cm MR Vmax:      337.00 cm/s MV E velocity: 89.10 cm/s MV A velocity: 130.00 cm/s MV E/A ratio:  0.69  Mihai Croitoru MD Electronically signed by Thurmon Fair MD Signature Date/Time: 01/31/2023/10:28:18 AM    Final    MONITORS  CARDIAC EVENT MONITOR 03/14/2023  Narrative   Patient had a minimum heart rate of 63 bpm, maximum heart rate of 154 bpm, and average heart rate of 83 bpm.   Predominant underlying rhythm was sinus rhythm.   No atrial fibrillation.   Isolated PACs were rare (<1.0%).   Isolated PVCs were rare (<1.0%).    Triggered and diary events associated with sinus rhythm.  No malignant arrhythmias.            Physical Exam:    VS:  BP 122/60   Pulse 84   Ht 5\' 4"  (1.626 m)   Wt 201 lb 6.4 oz (91.4 kg)   SpO2 97%   BMI 34.57 kg/m    Wt Readings from Last 3 Encounters:  04/05/23 201 lb 6.4 oz (91.4 kg)  03/28/23 200 lb (90.7 kg)  03/15/23 202 lb 3.2 oz (91.7 kg)    Gen: no distress obese female   Neck: No JVD Cardiac: No Rubs or Gallops, systolic murmur, RRR, +2 radial pulses Respiratory: Clear to auscultation bilaterally, normal effort, normal respiratory rate GI: Soft, nontender, non-distended  MS: No edema;  moves all extremities Integument: Skin feels warm Neuro:  At time of evaluation, alert and oriented to person/place/time/situation  Psych: Normal affect, patient feels fine   ASSESSMENT AND PLAN: .    Tricuspid regurgitation - moderate without RV dysfunction - lasix  20 mg PO PRN LE edema - echo 01/2024  Seizures and Syncope - no evidence of cardiac syncope - no evidence of orthostatic hypotension  Me or APP May of 2025  Riley Lam, MD [facility] Cardiologist [facility]  [address] [place], [address] [place], [place] [address] [phone]  2:18 PM

## 2023-04-05 NOTE — Patient Instructions (Signed)
Medication Instructions:  Your physician has recommended you make the following change in your medication:  START: Furosemide (Lasix) 20 mg by mouth daily as needed for swelling and weight gain   *If you need a refill on your cardiac medications before your next appointment, please call your pharmacy*   Lab Work: NONE  If you have labs (blood work) drawn today and your tests are completely normal, you will receive your results only by: MyChart Message (if you have MyChart) OR A paper copy in the mail If you have any lab test that is abnormal or we need to change your treatment, we will call you to review the results.   Testing/Procedures: MAY 2025: Your physician has requested that you have an echocardiogram. Echocardiography is a painless test that uses sound waves to create images of your heart. It provides your doctor with information about the size and shape of your heart and how well your heart's chambers and valves are working. This procedure takes approximately one hour. There are no restrictions for this procedure. Please do NOT wear cologne, perfume, aftershave, or lotions (deodorant is allowed). Please arrive 15 minutes prior to your appointment time.    Follow-Up: At [facility], you and your health needs are our priority.  As part of our continuing mission to provide you with exceptional heart care, we have created designated Provider Care Teams.  These Care Teams include your primary Cardiologist (physician) and Advanced Practice Providers (APPs -  Physician Assistants and Nurse Practitioners) who all work together to provide you with the care you need, when you need it.  We recommend signing up for the patient portal called "MyChart".  Sign up information is provided on this After Visit Summary.  MyChart is used to connect with patients for Virtual Visits (Telemedicine).  Patients are able to view lab/test results, encounter notes, upcoming appointments, etc.   Non-urgent messages can be sent to your provider as well.   To learn more about what you can do with MyChart, go to [email or web address].    Your next appointment:   10 month(s)  Provider:   Christell Constant, MD

## 2023-04-06 DIAGNOSIS — R278 Other lack of coordination: Secondary | ICD-10-CM | POA: Diagnosis not present

## 2023-04-06 DIAGNOSIS — R41841 Cognitive communication deficit: Secondary | ICD-10-CM | POA: Diagnosis not present

## 2023-04-06 DIAGNOSIS — R262 Difficulty in walking, not elsewhere classified: Secondary | ICD-10-CM | POA: Diagnosis not present

## 2023-04-06 DIAGNOSIS — M6281 Muscle weakness (generalized): Secondary | ICD-10-CM | POA: Diagnosis not present

## 2023-04-07 DIAGNOSIS — R41841 Cognitive communication deficit: Secondary | ICD-10-CM | POA: Diagnosis not present

## 2023-04-07 DIAGNOSIS — M6281 Muscle weakness (generalized): Secondary | ICD-10-CM | POA: Diagnosis not present

## 2023-04-07 DIAGNOSIS — R278 Other lack of coordination: Secondary | ICD-10-CM | POA: Diagnosis not present

## 2023-04-07 DIAGNOSIS — R262 Difficulty in walking, not elsewhere classified: Secondary | ICD-10-CM | POA: Diagnosis not present

## 2023-04-10 DIAGNOSIS — M6281 Muscle weakness (generalized): Secondary | ICD-10-CM | POA: Diagnosis not present

## 2023-04-10 DIAGNOSIS — R41841 Cognitive communication deficit: Secondary | ICD-10-CM | POA: Diagnosis not present

## 2023-04-10 DIAGNOSIS — R262 Difficulty in walking, not elsewhere classified: Secondary | ICD-10-CM | POA: Diagnosis not present

## 2023-04-10 DIAGNOSIS — R278 Other lack of coordination: Secondary | ICD-10-CM | POA: Diagnosis not present

## 2023-04-12 DIAGNOSIS — R278 Other lack of coordination: Secondary | ICD-10-CM | POA: Diagnosis not present

## 2023-04-12 DIAGNOSIS — M6281 Muscle weakness (generalized): Secondary | ICD-10-CM | POA: Diagnosis not present

## 2023-04-12 DIAGNOSIS — R262 Difficulty in walking, not elsewhere classified: Secondary | ICD-10-CM | POA: Diagnosis not present

## 2023-04-12 DIAGNOSIS — R41841 Cognitive communication deficit: Secondary | ICD-10-CM | POA: Diagnosis not present

## 2023-04-13 ENCOUNTER — Non-Acute Institutional Stay: Payer: Medicare Other | Admitting: Nurse Practitioner

## 2023-04-13 ENCOUNTER — Encounter: Payer: Self-pay | Admitting: Nurse Practitioner

## 2023-04-13 VITALS — BP 126/82 | HR 100 | Temp 98.2°F | Ht 64.0 in | Wt 201.0 lb

## 2023-04-13 DIAGNOSIS — E43 Unspecified severe protein-calorie malnutrition: Secondary | ICD-10-CM

## 2023-04-13 DIAGNOSIS — R7303 Prediabetes: Secondary | ICD-10-CM

## 2023-04-13 DIAGNOSIS — R609 Edema, unspecified: Secondary | ICD-10-CM | POA: Insufficient documentation

## 2023-04-13 DIAGNOSIS — I10 Essential (primary) hypertension: Secondary | ICD-10-CM | POA: Diagnosis not present

## 2023-04-13 DIAGNOSIS — R7989 Other specified abnormal findings of blood chemistry: Secondary | ICD-10-CM | POA: Diagnosis not present

## 2023-04-13 DIAGNOSIS — E038 Other specified hypothyroidism: Secondary | ICD-10-CM | POA: Insufficient documentation

## 2023-04-13 DIAGNOSIS — I361 Nonrheumatic tricuspid (valve) insufficiency: Secondary | ICD-10-CM | POA: Diagnosis not present

## 2023-04-13 DIAGNOSIS — G40909 Epilepsy, unspecified, not intractable, without status epilepticus: Secondary | ICD-10-CM

## 2023-04-13 DIAGNOSIS — Z66 Do not resuscitate: Secondary | ICD-10-CM | POA: Diagnosis not present

## 2023-04-13 NOTE — Assessment & Plan Note (Signed)
Hgb A1c 6.4 10/04/22 

## 2023-04-13 NOTE — Progress Notes (Signed)
Location:   clinic [facility]   Place of Service:  Clinic (12) Provider: Chipper Oman NP  Code Status: DNR Goals of Care:     04/13/2023    9:48 AM  Advanced Directives  Does Patient Have a Medical Advance Directive? Yes  Type of Estate agent of [place];Living will;Out of facility DNR (pink MOST or yellow form)  Does patient want to make changes to medical advance directive? No - Patient declined  Copy of Healthcare Power of Attorney in Chart? Yes - validated most recent copy scanned in chart (See row information)  Pre-existing out of facility DNR order (yellow form or pink MOST form) Yellow form placed in chart (order not valid for inpatient use);Pink MOST form placed in chart (order not valid for inpatient use)     Chief Complaint  Patient presents with   Follow-up    1 month follow-up.     HPI: Patient is a 82 y.o. female seen today for medical management of chronic diseases.    Cardiology 04/05/23 in concern of syncope episode in May/hospitalization, on Furosemide 20mg  prn, no cardiac syncope or orthostatic hypotension,  Echo: tricuspid regurgitation, no RV dysfunction. F/u one year.   CT abd 4cm hypodense lesion left lobe of the liver, lesion from the left kidney, recommended [place]             Seizure, ED eval 09/11/23 for unresponsive 10-15 minutes, bit her tongue, off Depakote, saw neurology 03/28/23: will try Zongran. Hospital 01/27/23-01/31/23 for seizure,EEG w/o seizure like activities, no antiepileptics recommended by Neurology,  LOC, CT head no acute process, more concerned of syncope etiology             HTN, taking Lisinopril.              Prediabetes Hgb A1c 6.4 10/04/22 Hx of hyperlipidemia, LDL 125 10/04/22,  off Atorvastatin The patient stated she sleeps better, bathroom trip only 1x/night.  CKD Bun/creat 15/1.12 01/31/23 11/22/22 Mammogram: Further evaluation is suggested for a possible mass in the left breast. Edema BLE, taking Furosemide daily Elevated  TSH 5.269 03/10/23 Past Medical History:  Diagnosis Date   Arthritis    Cancer [facility])    skin   Cataract 2010   Dr. Luretha Rued, DrRomie Minus @ [facility]   Epilepsy [facility]) 08/11/2015   Hyperlipidemia 12/08/2015   Hypertension    Hypothyroid    Polyp of colon 08/11/2015    Past Surgical History:  Procedure Laterality Date   ABDOMINAL HYSTERECTOMY  1970   APPENDECTOMY  1958   BREAST SURGERY  1984   COLON SURGERY  09/12/2006   FEMUR SURGERY  2018   Broken   TONSILLECTOMY  09/12/1944    Allergies  Allergen Reactions   Barbiturates Other (See Comments)    A small amount "overdosed" the patient- "affected my stability and balance" (might have been given at the same as another med?)   Cefdinir Other (See Comments)    A small amount "overdosed" the patient- "affected my stability and balance" (might have been given at the same as another med?)   Codeine Other (See Comments)    "Moderate," per pharmacy   Phenobarbital Other (See Comments)    A small amount "overdosed" the patient- "affected my stability and balance" (might have been given at the same as another med?)   Tegretol [Carbamazepine] Other (See Comments)    A small amount "overdosed" the patient- "affected my stability and balance" (might have been given at the same as  another med?)   Tramadol Other (See Comments)    "Moderate," per pharmacy   Zonisamide Other (See Comments)    "Negative reaction" (Zonegran)    Allergies as of 04/13/2023       Reactions   Barbiturates Other (See Comments)   A small amount "overdosed" the patient- "affected my stability and balance" (might have been given at the same as another med?)   Cefdinir Other (See Comments)   A small amount "overdosed" the patient- "affected my stability and balance" (might have been given at the same as another med?)   Codeine Other (See Comments)   "Moderate," per pharmacy   Phenobarbital Other (See Comments)   A small amount "overdosed" the patient- "affected my stability  and balance" (might have been given at the same as another med?)   Tegretol [carbamazepine] Other (See Comments)   A small amount "overdosed" the patient- "affected my stability and balance" (might have been given at the same as another med?)   Tramadol Other (See Comments)   "Moderate," per pharmacy   Zonisamide Other (See Comments)   "Negative reaction" (Zonegran)        Medication List        Accurate as of April 13, 2023  4:19 PM. If you have any questions, ask your nurse or doctor.          furosemide 20 MG tablet Commonly known as: LASIX Take 1 tablet (20 mg total) by mouth daily.   ketoconazole 2 % shampoo Commonly known as: NIZORAL Apply 1 application  topically once a week. As needed   lisinopril 10 MG tablet Commonly known as: ZESTRIL Take 1 tablet (10 mg total) by mouth daily.   One-A-Day Womens 50 Plus Tabs Take 1 tablet by mouth daily with breakfast.   PreserVision AREDS 2 Caps Take 2 capsules by mouth daily.   Systane 0.4-0.3 % Soln Generic drug: Polyethyl Glycol-Propyl Glycol Place 1-2 drops into both eyes 3 (three) times daily as needed (for dryness).   Vitamin D-3 25 MCG (1000 UT) Caps TAKE 1 CAPSULE DAILY WITH BREAKFAST.   zonisamide 100 MG capsule Commonly known as: ZONEGRAN Take 1 capsule (100 mg total) by mouth at bedtime.        Review of Systems:  Review of Systems  Constitutional:  Negative for fatigue, fever and unexpected weight change.  HENT:  Positive for hearing loss. Negative for congestion and voice change.   Respiratory:  Negative for cough, chest tightness, shortness of breath and wheezing.   Cardiovascular:  Positive for leg swelling. Negative for chest pain and palpitations.  Gastrointestinal:  Negative for abdominal distention, abdominal pain, constipation and diarrhea.  Genitourinary:  Negative for difficulty urinating, dysuria and urgency.  Musculoskeletal:  Positive for arthralgias and gait problem.  Skin:  Negative  for color change.  Neurological:  Negative for dizziness, seizures, speech difficulty, weakness and headaches.  Psychiatric/Behavioral:  Negative for behavioral problems and sleep disturbance. The patient is not nervous/anxious.     Health Maintenance  Topic Date Due   COVID-19 Vaccine (4 - 2023-24 season) 09/08/2022   INFLUENZA VACCINE  04/13/2023   Medicare Annual Wellness (AWV)  11/22/2023   DTaP/Tdap/Td (3 - Td or Tdap) 12/21/2025   Pneumonia Vaccine 39+ Years old  Completed   DEXA SCAN  Completed   Zoster Vaccines- Shingrix  Completed   HPV VACCINES  Aged Out    Physical Exam: Vitals:   04/13/23 0945  BP: 126/82  Pulse: 100  Temp: 98.2  F (36.8 C)  TempSrc: Temporal  SpO2: 97%  Weight: 201 lb (91.2 kg)  Height: 5\' 4"  (1.626 m)   Body mass index is 34.5 kg/m. Physical Exam Vitals and nursing note reviewed.  Constitutional:      Appearance: Normal appearance. She is obese.  HENT:     Head: Normocephalic and atraumatic.     Nose: Nose normal.     Mouth/Throat:     Mouth: Mucous membranes are moist.  Eyes:     Extraocular Movements: Extraocular movements intact.     Conjunctiva/sclera: Conjunctivae normal.     Pupils: Pupils are equal, round, and reactive to light.  Cardiovascular:     Rate and Rhythm: Normal rate and regular rhythm.     Heart sounds: No murmur heard. Pulmonary:     Effort: Pulmonary effort is normal.     Breath sounds: No wheezing, rhonchi or rales.  Abdominal:     General: Bowel sounds are normal.     Palpations: Abdomen is soft.     Tenderness: There is no abdominal tenderness.  Musculoskeletal:     Cervical back: Normal range of motion and neck supple.     Right lower leg: Edema present.     Left lower leg: Edema present.     Comments: Trace edema BLE R>L  Skin:    General: Skin is warm and dry.  Neurological:     General: No focal deficit present.     Mental Status: She is alert and oriented to person, place, and time. Mental  status is at baseline.     Motor: No weakness.     Coordination: Coordination normal.     Gait: Gait abnormal.  Psychiatric:        Mood and Affect: Mood normal.        Behavior: Behavior normal.        Thought Content: Thought content normal.        Judgment: Judgment normal.     Labs reviewed: Basic Metabolic Panel: Recent Labs    01/27/23 1510 01/28/23 0043 01/31/23 0303 02/14/23 0734 03/10/23 1900 03/11/23 0022 03/11/23 0432  NA  --    < > 136 138 138  --  137  K  --    < > 3.2* 4.4 4.1  --  3.6  CL  --    < > 101 102 97*  --  103  CO2  --    < > 26 27 25   --  24  GLUCOSE  --    < > 134* 102* 139*  --  123*  BUN  --    < > 15 23 17   --  13  CREATININE  --    < > 1.12* 0.96* 1.09* 0.95 1.03*  CALCIUM  --    < > 8.3* 9.7 9.8  --  8.8*  MG  --    < > 1.9  --  2.1  --  1.9  PHOS  --   --   --   --   --   --  3.5  TSH 4.566*  --   --   --  5.269*  --   --    < > = values in this interval not displayed.   Liver Function Tests: Recent Labs    01/27/23 1510 02/14/23 0734 03/10/23 1900  AST 22 16 24   ALT 20 20 17   ALKPHOS 70  --  89  BILITOT 0.2* 0.5 0.3  PROT  7.0 7.0 7.3  ALBUMIN 3.5  --  3.6   Recent Labs    01/27/23 1510  LIPASE 57*   Recent Labs    03/10/23 1900  AMMONIA 15   CBC: Recent Labs    02/14/23 0734 03/10/23 1900 03/11/23 0022 03/11/23 0432  WBC 8.4 12.0* 12.3* 11.8*  NEUTROABS 4,427 8.7*  --   --   HGB 12.8 12.7 12.2 11.7*  HCT 38.8 40.4 38.5 36.9  MCV 85.5 89.2 88.7 89.1  PLT 371 266 253 246   Lipid Panel: Recent Labs    10/04/22 0734  CHOL 202*  HDL 52  LDLCALC 125*  TRIG 136  CHOLHDL 3.9   Lab Results  Component Value Date   HGBA1C 6.2 (H) 03/13/2023    Procedures since last visit: No results found.  Assessment/Plan  HTN (hypertension) Blood pressure is controlled, on Lisinopril.   Edema Daily Furosemide.   Nonrheumatic tricuspid valve regurgitation Cardiology 04/05/23 in concern of syncope episode in  May/hospitalization, on Furosemide 20mg  prn, no cardiac syncope or orthostatic hypotension,  Echo: tricuspid regurgitation, no RV dysfunction. F/u one year.   Seizure disorder [facility]) ED eval 09/11/23 for unresponsive 10-15 minutes, bit her tongue, off Depakote, saw neurology 03/28/23: Zongran trial at night.  Hospital 01/27/23-01/31/23 for seizure,EEG w/o seizure like activities, no antiepileptics recommended by Neurology,  LOC, CT head no acute process, more concerned of syncope etiology   Prediabetes Hgb A1c 6.4 10/04/22   Elevated TSH CBC/diff, CMP/eGFR, TSH, lipid panel, Hgb a1c   Labs/tests ordered:  CBC/diff, CMP/eGFR, TSH, lipid panel, Hgb a1c Next appt:  4 months

## 2023-04-13 NOTE — Assessment & Plan Note (Signed)
CBC/diff, CMP/eGFR, TSH, lipid panel, Hgb a1c

## 2023-04-13 NOTE — Assessment & Plan Note (Addendum)
Daily Furosemide.

## 2023-04-13 NOTE — Assessment & Plan Note (Signed)
Blood pressure is controlled, on Lisinopril.

## 2023-04-13 NOTE — Assessment & Plan Note (Addendum)
ED eval 09/11/23 for unresponsive 10-15 minutes, bit her tongue, off Depakote, saw neurology 03/28/23: Zongran trial at night.  Hospital 01/27/23-01/31/23 for seizure,EEG w/o seizure like activities, no antiepileptics recommended by Neurology,  LOC, CT head no acute process, more concerned of syncope etiology

## 2023-04-13 NOTE — Assessment & Plan Note (Signed)
Cardiology 04/05/23 in concern of syncope episode in May/hospitalization, on Furosemide 20mg  prn, no cardiac syncope or orthostatic hypotension,  Echo: tricuspid regurgitation, no RV dysfunction. F/u one year.

## 2023-04-14 DIAGNOSIS — R278 Other lack of coordination: Secondary | ICD-10-CM | POA: Diagnosis not present

## 2023-04-14 DIAGNOSIS — R41841 Cognitive communication deficit: Secondary | ICD-10-CM | POA: Diagnosis not present

## 2023-04-17 DIAGNOSIS — R278 Other lack of coordination: Secondary | ICD-10-CM | POA: Diagnosis not present

## 2023-04-17 DIAGNOSIS — R41841 Cognitive communication deficit: Secondary | ICD-10-CM | POA: Diagnosis not present

## 2023-04-19 DIAGNOSIS — R41841 Cognitive communication deficit: Secondary | ICD-10-CM | POA: Diagnosis not present

## 2023-04-19 DIAGNOSIS — R278 Other lack of coordination: Secondary | ICD-10-CM | POA: Diagnosis not present

## 2023-04-21 ENCOUNTER — Encounter: Payer: Self-pay | Admitting: Family Medicine

## 2023-04-21 DIAGNOSIS — R278 Other lack of coordination: Secondary | ICD-10-CM | POA: Diagnosis not present

## 2023-04-21 DIAGNOSIS — R41841 Cognitive communication deficit: Secondary | ICD-10-CM | POA: Diagnosis not present

## 2023-04-24 ENCOUNTER — Encounter: Payer: Self-pay | Admitting: Family Medicine

## 2023-04-24 DIAGNOSIS — R41841 Cognitive communication deficit: Secondary | ICD-10-CM | POA: Diagnosis not present

## 2023-04-24 DIAGNOSIS — R278 Other lack of coordination: Secondary | ICD-10-CM | POA: Diagnosis not present

## 2023-04-26 DIAGNOSIS — R278 Other lack of coordination: Secondary | ICD-10-CM | POA: Diagnosis not present

## 2023-04-26 DIAGNOSIS — R41841 Cognitive communication deficit: Secondary | ICD-10-CM | POA: Diagnosis not present

## 2023-04-28 DIAGNOSIS — R41841 Cognitive communication deficit: Secondary | ICD-10-CM | POA: Diagnosis not present

## 2023-04-28 DIAGNOSIS — R278 Other lack of coordination: Secondary | ICD-10-CM | POA: Diagnosis not present

## 2023-05-12 DIAGNOSIS — R41841 Cognitive communication deficit: Secondary | ICD-10-CM | POA: Diagnosis not present

## 2023-05-12 DIAGNOSIS — R278 Other lack of coordination: Secondary | ICD-10-CM | POA: Diagnosis not present

## 2023-05-13 ENCOUNTER — Emergency Department (HOSPITAL_COMMUNITY)
Admission: EM | Admit: 2023-05-13 | Discharge: 2023-05-13 | Disposition: A | Discharge status: Home / Self Care | Payer: Medicare Other | Source: Home / Self Care | Attending: Emergency Medicine | Admitting: Emergency Medicine

## 2023-05-13 ENCOUNTER — Emergency Department (HOSPITAL_COMMUNITY): Payer: Medicare Other

## 2023-05-13 ENCOUNTER — Other Ambulatory Visit: Payer: Self-pay

## 2023-05-13 ENCOUNTER — Encounter (HOSPITAL_COMMUNITY): Payer: Self-pay

## 2023-05-13 DIAGNOSIS — I10 Essential (primary) hypertension: Secondary | ICD-10-CM | POA: Diagnosis not present

## 2023-05-13 DIAGNOSIS — R110 Nausea: Secondary | ICD-10-CM | POA: Insufficient documentation

## 2023-05-13 DIAGNOSIS — R55 Syncope and collapse: Secondary | ICD-10-CM | POA: Diagnosis not present

## 2023-05-13 DIAGNOSIS — J9811 Atelectasis: Secondary | ICD-10-CM | POA: Diagnosis not present

## 2023-05-13 DIAGNOSIS — J984 Other disorders of lung: Secondary | ICD-10-CM | POA: Diagnosis not present

## 2023-05-13 DIAGNOSIS — R410 Disorientation, unspecified: Secondary | ICD-10-CM | POA: Diagnosis not present

## 2023-05-13 DIAGNOSIS — R569 Unspecified convulsions: Secondary | ICD-10-CM | POA: Diagnosis not present

## 2023-05-13 LAB — TROPONIN I (HIGH SENSITIVITY)
Troponin I (High Sensitivity): 5 ng/L (ref <18)
Troponin I (High Sensitivity): 9 ng/L (ref <18)

## 2023-05-13 LAB — CBC WITH DIFFERENTIAL/PLATELET
Abs Immature Granulocytes: 0.05 10*3/uL (ref 0.00–0.07)
Basophils Absolute: 0.1 10*3/uL (ref 0.0–0.1)
Basophils Relative: 1 %
Eosinophils Absolute: 0.5 10*3/uL (ref 0.0–0.5)
Eosinophils Relative: 5 %
HCT: 41 % (ref 36.0–46.0)
Hemoglobin: 12.7 g/dL (ref 12.0–15.0)
Immature Granulocytes: 0 %
Lymphocytes Relative: 30 %
Lymphs Abs: 3.3 10*3/uL (ref 0.7–4.0)
MCH: 28 pg (ref 26.0–34.0)
MCHC: 31 g/dL (ref 30.0–36.0)
MCV: 90.5 fL (ref 80.0–100.0)
Monocytes Absolute: 0.7 10*3/uL (ref 0.1–1.0)
Monocytes Relative: 7 %
Neutro Abs: 6.5 10*3/uL (ref 1.7–7.7)
Neutrophils Relative %: 57 %
Platelets: 269 10*3/uL (ref 150–400)
RBC: 4.53 MIL/uL (ref 3.87–5.11)
RDW: 14.8 % (ref 11.5–15.5)
WBC: 11.2 10*3/uL — ABNORMAL HIGH (ref 4.0–10.5)
nRBC: 0 % (ref 0.0–0.2)

## 2023-05-13 LAB — BASIC METABOLIC PANEL
Anion gap: 12 (ref 5–15)
BUN: 23 mg/dL (ref 8–23)
CO2: 24 mmol/L (ref 22–32)
Calcium: 8.9 mg/dL (ref 8.9–10.3)
Chloride: 101 mmol/L (ref 98–111)
Creatinine, Ser: 1.01 mg/dL — ABNORMAL HIGH (ref 0.44–1.00)
GFR, Estimated: 55 mL/min — ABNORMAL LOW (ref >60)
Glucose, Bld: 178 mg/dL — ABNORMAL HIGH (ref 70–99)
Potassium: 3.4 mmol/L — ABNORMAL LOW (ref 3.5–5.1)
Sodium: 137 mmol/L (ref 135–145)

## 2023-05-13 LAB — GROUP A STREP BY PCR: Group A Strep by PCR: NOT DETECTED

## 2023-05-13 MED ORDER — ONDANSETRON HCL 4 MG/2ML IJ SOLN
4.0000 mg | Freq: Once | INTRAMUSCULAR | Status: AC
  Administered 2023-05-13: 4 mg via INTRAVENOUS
  Filled 2023-05-13: qty 2

## 2023-05-13 NOTE — ED Triage Notes (Signed)
Pt  [facility].  Staff reports finding pt slumped over in her chair, thought to be out for approximately 1 minute.  Pt was postictal immediately afterward.  Pt has hx of seizures, not currently taking medication for the last two weeks.  Pt is a&o x 4 at time of triage.

## 2023-05-13 NOTE — ED Provider Notes (Signed)
[facility] AT [facility] Provider Note   CSN: [id number] Arrival date & time: 05/13/23  1014     History  Chief Complaint  Patient presents with   Seizures    Katherine Obrien is a 82 y.o. female.  82 year old female with prior medical history as detailed below presents for evaluation.  Patient reports that she was in her normal state of health and was eating breakfast.  She developed acute onset nausea.  This is the last thing that she recalls.  Apparently staff at her facility noted that she had slumped over and become unresponsive.  She apparently was unresponsive for approximately 1 minute.  Patient denies current complaint.  She is alert and oriented.  She is without headache, nausea, vomiting.  Patient with history of seizure.  She reports that she has not had a seizure in quite some time.  She is not currently taking antiseizure medications.  She does not feel as though she had a seizure today.  She did not bite her tongue.  She was not incontinent of urine.  The history is provided by the patient and medical records.       Home Medications Prior to Admission medications   Medication Sig Start Date End Date Taking? Authorizing Provider  Cholecalciferol (VITAMIN D-3) 25 MCG (1000 UT) CAPS TAKE 1 CAPSULE DAILY WITH BREAKFAST. 09/24/19   Mast, Man X, NP  furosemide (LASIX) 20 MG tablet Take 1 tablet (20 mg total) by mouth daily. 04/05/23   Chandrasekhar, Mahesh A, MD  ketoconazole (NIZORAL) 2 % shampoo Apply 1 application  topically once a week. As needed 04/23/19   [provider]  lisinopril (ZESTRIL) 10 MG tablet Take 1 tablet (10 mg total) by mouth daily. 02/03/23   Mast, Man X, NP  Multiple Vitamins-Minerals (ONE-A-DAY WOMENS 50 PLUS) TABS Take 1 tablet by mouth daily with breakfast.    [provider]  Multiple Vitamins-Minerals (PRESERVISION AREDS 2) CAPS Take 2 capsules by mouth daily.    [provider]   Polyethyl Glycol-Propyl Glycol (SYSTANE) 0.4-0.3 % SOLN Place 1-2 drops into both eyes 3 (three) times daily as needed (for dryness).     [provider]  zonisamide (ZONEGRAN) 100 MG capsule Take 1 capsule (100 mg total) by mouth at bedtime. 03/28/23 09/24/23  Windell Norfolk, MD      Allergies    Barbiturates, Cefdinir, Codeine, Phenobarbital, Tegretol [carbamazepine], Tramadol, and Zonisamide    Review of Systems   Review of Systems  Physical Exam Updated Vital Signs BP (!) 180/78 Comment: nurse notified  Pulse (!) 109   Temp 98.2 F (36.8 C) (Oral)   Resp 16   Ht 5\' 4"  (1.626 m)   Wt 90.7 kg   SpO2 96%   BMI 34.33 kg/m  Physical Exam Vitals and nursing note reviewed.  Constitutional:      General: She is not in acute distress.    Appearance: Normal appearance. She is well-developed.  HENT:     Head: Normocephalic and atraumatic.  Eyes:     Conjunctiva/sclera: Conjunctivae normal.     Pupils: Pupils are equal, round, and reactive to light.  Cardiovascular:     Rate and Rhythm: Normal rate and regular rhythm.     Heart sounds: Normal heart sounds.  Pulmonary:     Effort: Pulmonary effort is normal. No respiratory distress.     Breath sounds: Normal breath sounds.  Abdominal:     General: There is no distension.  Palpations: Abdomen is soft.     Tenderness: There is no abdominal tenderness.  Musculoskeletal:        General: No deformity. Normal range of motion.     Cervical back: Normal range of motion and neck supple.  Skin:    General: Skin is warm and dry.  Neurological:     General: No focal deficit present.     Mental Status: She is alert and oriented to person, place, and time.     ED Results / Procedures / Treatments   Labs (all labs ordered are listed, but only abnormal results are displayed) Labs Reviewed  GROUP A STREP BY PCR  CBC WITH DIFFERENTIAL/PLATELET  BASIC METABOLIC PANEL  TROPONIN I (HIGH SENSITIVITY)     EKG None  Radiology No results found.  Procedures Procedures    Medications Ordered in ED Medications  ondansetron (ZOFRAN) injection 4 mg (has no administration in time range)    ED Course/ Medical Decision Making/ A&P                                 Medical Decision Making Amount and/or Complexity of Data Reviewed Labs: ordered. Radiology: ordered.  Risk Prescription drug management.    Medical Screen Complete  This patient presented to the ED with complaint of syncope.  This complaint involves an extensive number of treatment options. The initial differential diagnosis includes, but is not limited to, metabolic abnormality, arrhythmia, seizure, etc.  This presentation is: Acute, Self-Limited, Previously Undiagnosed, Uncertain Prognosis, Complicated, Systemic Symptoms, and Threat to Life/Bodily Function  She reports acute onset of nausea and then subsequent brief syncope.  Patient's symptoms occurred just after breakfast.  On arrival to the ED the patient is without complaint.  She feels significant improved.  Patient's described history is not consistent with likely seizure or arrhythmia.  Screening labs obtained are without significant abnormal acute findings.  Patient offered additional observation and/or admission.  She declines.  [facility]  She does not want to be kept in the hospital overnight for additional workup.  Patient is encouraged to closely follow-up with her outpatient care providers.  Importance of close follow-up is stressed repeatedly.  Strict return precautions given and understood.  Additional history obtained:  Additional history obtained from EMS External records from outside sources obtained and reviewed including prior ED visits and prior Inpatient records.    Lab Tests:  I ordered and personally interpreted labs.  The pertinent results include: CBC, BMP, troponin x 2   Imaging Studies  ordered:  I ordered imaging studies including chest x-ray I independently visualized and interpreted obtained imaging which showed NAD I agree with the radiologist interpretation.   Cardiac Monitoring:  The patient was maintained on a cardiac monitor.  I personally viewed and interpreted the cardiac monitor which showed an underlying rhythm of: NSR   Medicines ordered:  I ordered medication including Zofran for possible nausea Reevaluation of the patient after these medicines showed that the patient: improved   Problem List / ED Course:  Syncope   Reevaluation:  After the interventions noted above, I reevaluated the patient and found that they have: resolved   Disposition:  After consideration of the diagnostic results and the patients response to treatment, I feel that the patent would benefit from close outpatient follow-up.          Final Clinical Impression(s) / ED Diagnoses Final  diagnoses:  Syncope, unspecified syncope type    Rx / DC Orders ED Discharge Orders     None         Wynetta Fines, MD 05/13/23 1538

## 2023-05-13 NOTE — ED Notes (Signed)
Contacted [facility] to pick up pt and take her [facility] # [phone]

## 2023-05-13 NOTE — ED Notes (Signed)
Katherine Obrien contacted [facility] and they agreed to send someone to pick up this pt.  [facility] cancelled

## 2023-05-13 NOTE — Discharge Instructions (Signed)
Return for any problem.  ?

## 2023-05-16 ENCOUNTER — Telehealth: Payer: Medicare Other

## 2023-05-16 NOTE — Transitions of Care (Post Inpatient/ED Visit) (Signed)
   05/16/2023  Name: Katherine Obrien MRN: [id number] DOB: [birth year]  Today's TOC FU Call Status: Today's TOC FU Call Status:: Successful TOC FU Call Completed TOC FU Call Complete Date: 05/16/23 Patient's Name and Date of Birth confirmed.  Transition Care Management Follow-up Telephone Call Date of Discharge: 05/13/23  Items Reviewed:    Medications Reviewed Today: Medications Reviewed Today   Medications were not reviewed in this encounter     Home Care and Equipment/Supplies:    Functional Questionnaire:    Follow up appointments reviewed:   PATIENT REFUSED !!!!    SIGNATURE: Tomoko Sandra.D/RMA

## 2023-05-19 DIAGNOSIS — R41841 Cognitive communication deficit: Secondary | ICD-10-CM | POA: Diagnosis not present

## 2023-05-23 DIAGNOSIS — L821 Other seborrheic keratosis: Secondary | ICD-10-CM | POA: Diagnosis not present

## 2023-05-23 DIAGNOSIS — L905 Scar conditions and fibrosis of skin: Secondary | ICD-10-CM | POA: Diagnosis not present

## 2023-05-23 DIAGNOSIS — L814 Other melanin hyperpigmentation: Secondary | ICD-10-CM | POA: Diagnosis not present

## 2023-05-23 DIAGNOSIS — L853 Xerosis cutis: Secondary | ICD-10-CM | POA: Diagnosis not present

## 2023-05-23 DIAGNOSIS — L570 Actinic keratosis: Secondary | ICD-10-CM | POA: Diagnosis not present

## 2023-05-26 DIAGNOSIS — R41841 Cognitive communication deficit: Secondary | ICD-10-CM | POA: Diagnosis not present

## 2023-05-27 DIAGNOSIS — Z23 Encounter for immunization: Secondary | ICD-10-CM | POA: Diagnosis not present

## 2023-06-02 DIAGNOSIS — R41841 Cognitive communication deficit: Secondary | ICD-10-CM | POA: Diagnosis not present

## 2023-06-09 DIAGNOSIS — R41841 Cognitive communication deficit: Secondary | ICD-10-CM | POA: Diagnosis not present

## 2023-06-16 DIAGNOSIS — R41841 Cognitive communication deficit: Secondary | ICD-10-CM | POA: Diagnosis not present

## 2023-07-04 DIAGNOSIS — R41841 Cognitive communication deficit: Secondary | ICD-10-CM | POA: Diagnosis not present

## 2023-07-12 DIAGNOSIS — Z23 Encounter for immunization: Secondary | ICD-10-CM | POA: Diagnosis not present

## 2023-07-14 DIAGNOSIS — R41841 Cognitive communication deficit: Secondary | ICD-10-CM | POA: Diagnosis not present

## 2023-07-21 DIAGNOSIS — R41841 Cognitive communication deficit: Secondary | ICD-10-CM | POA: Diagnosis not present

## 2023-07-21 IMAGING — CT CT HEAD W/O CM
3 of 6 series · 13 of 47 positions shown, 15 images · non-contrast
Comparison: Head CT 11/05/2019.

CLINICAL DATA: 79-year-old female with history of seizure.

EXAM:
CT HEAD WITHOUT CONTRAST
TECHNIQUE: Contiguous axial images were obtained from the base of the skull
through the vertex without intravenous contrast.

[Series 2: head wo · axial · 0.47mm/px · z∈[+1558,+1688]mm · 8 of 34 slices shown, 10 images]
[im 4/34  brain]
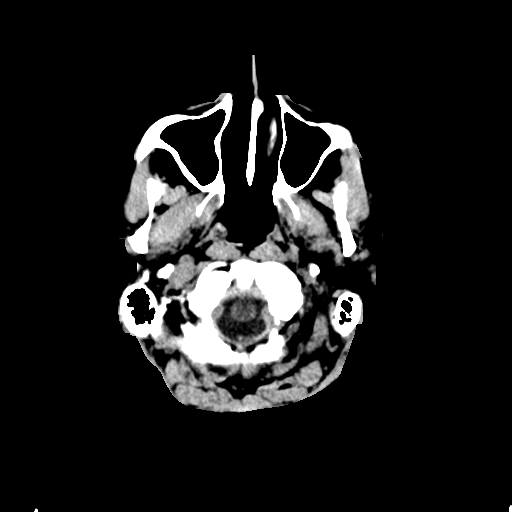
[im 4/34  bone]
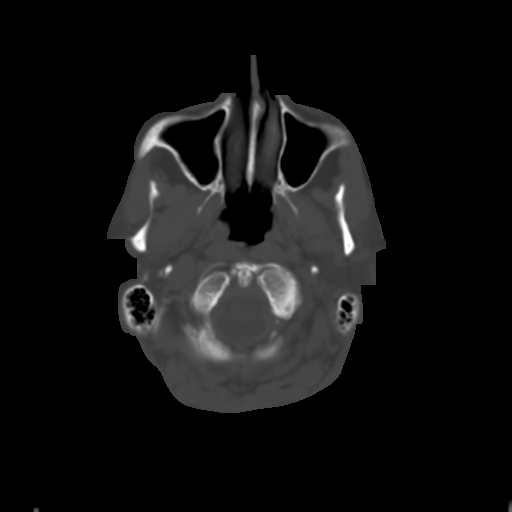
[im 7/34  brain]
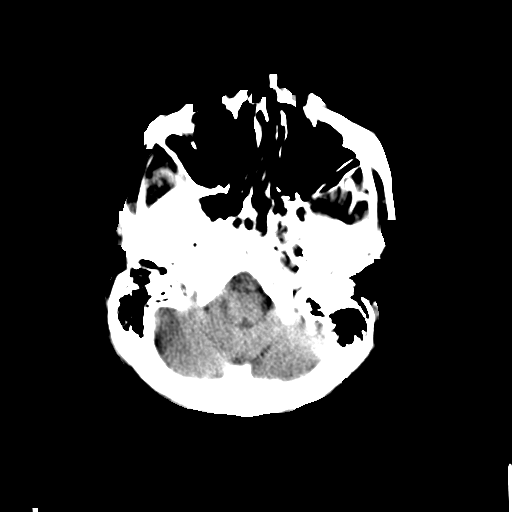
[im 11/34  brain]
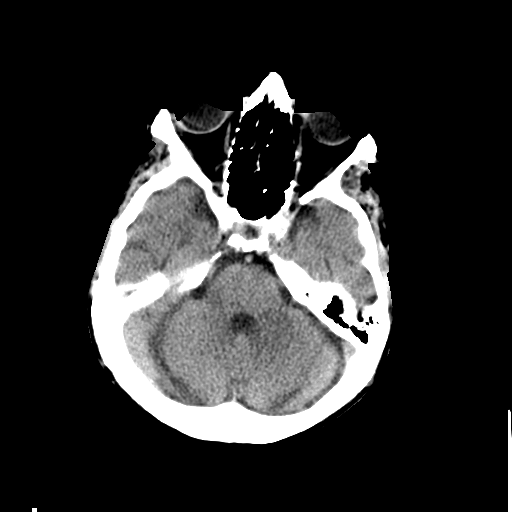
[im 14/34  brain]
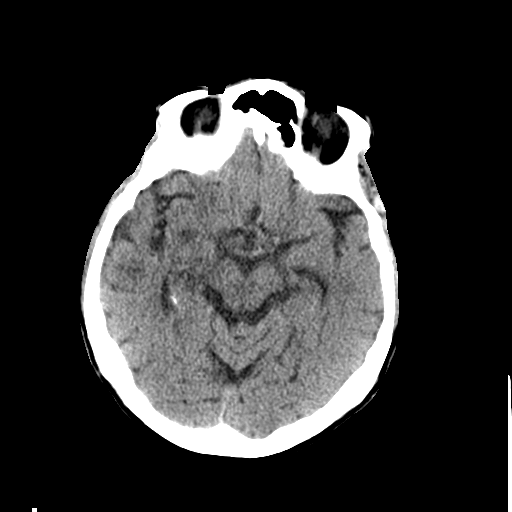
[im 20/34  brain]
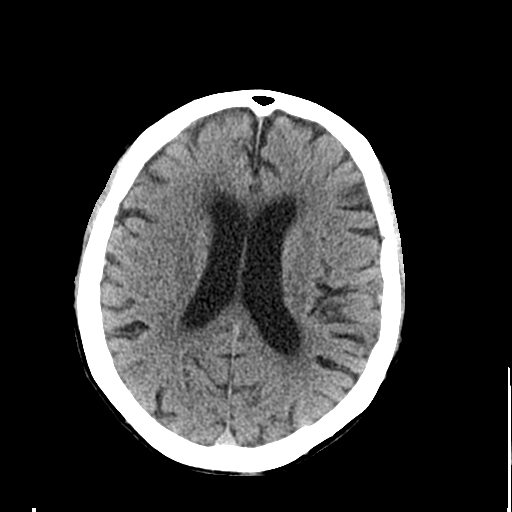
[im 20/34  bone]
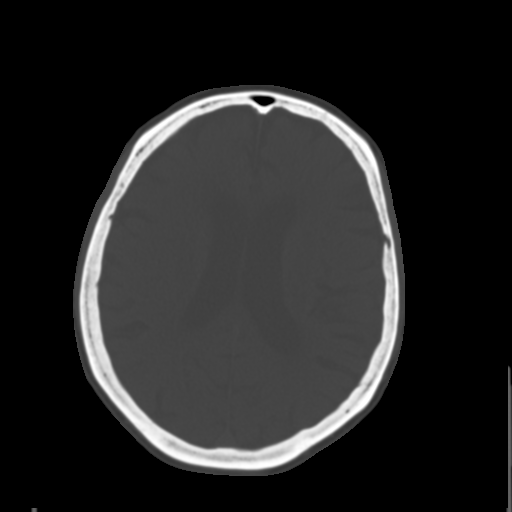
[im 23/34  brain]
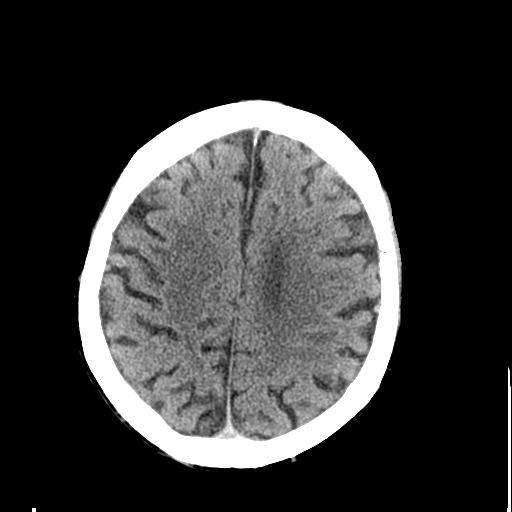
[im 27/34  brain]
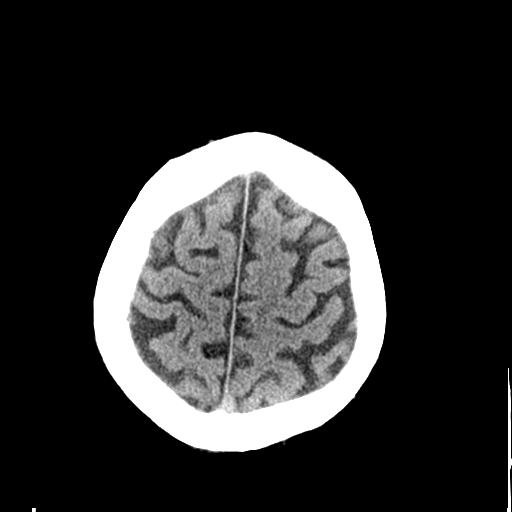
[im 30/34  brain]
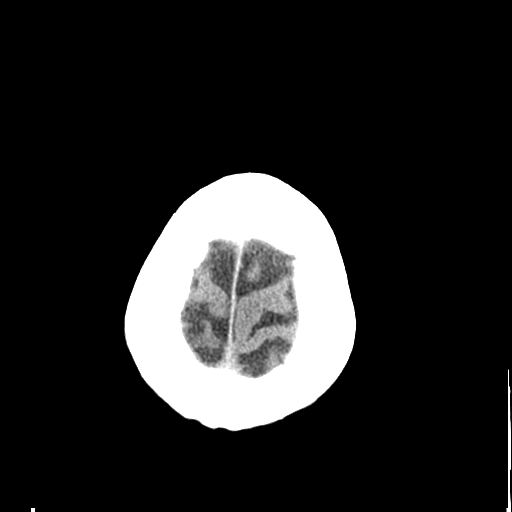

[Series 4: coronal soft tissue · coronal · 0.34mm/px · 3 of 67 slices shown]
[im 12/67  brain]
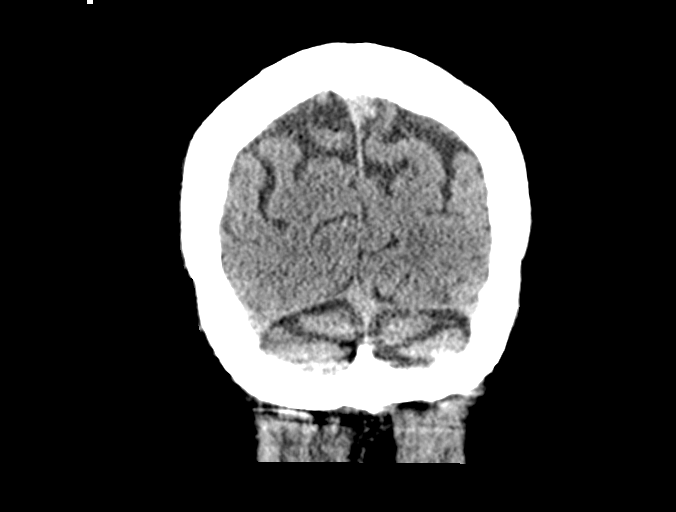
[im 18/67  brain]
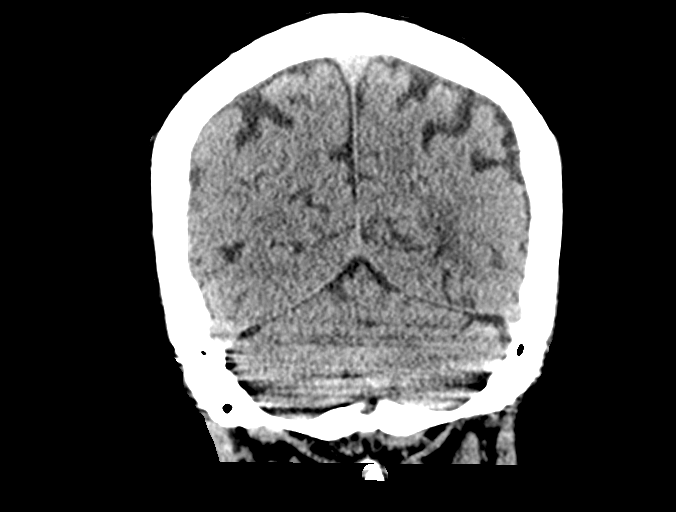
[im 24/67  brain]
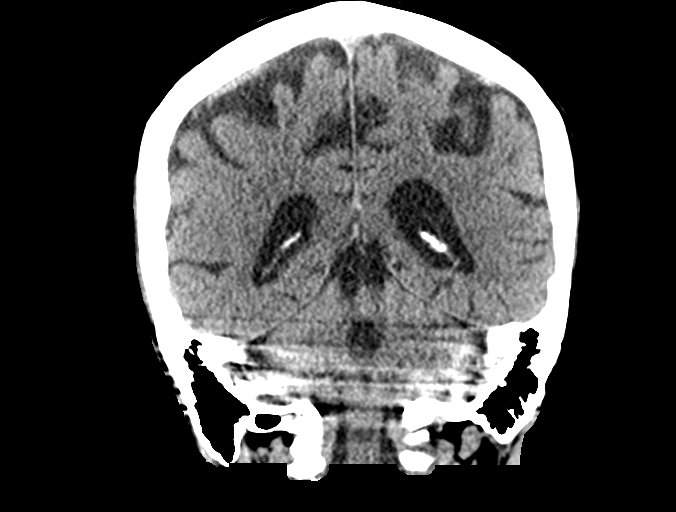

[Series 10: sagittal soft tissue · sagittal · 0.18mm/px · 2 of 55 slices shown]
[im 19/55  brain]
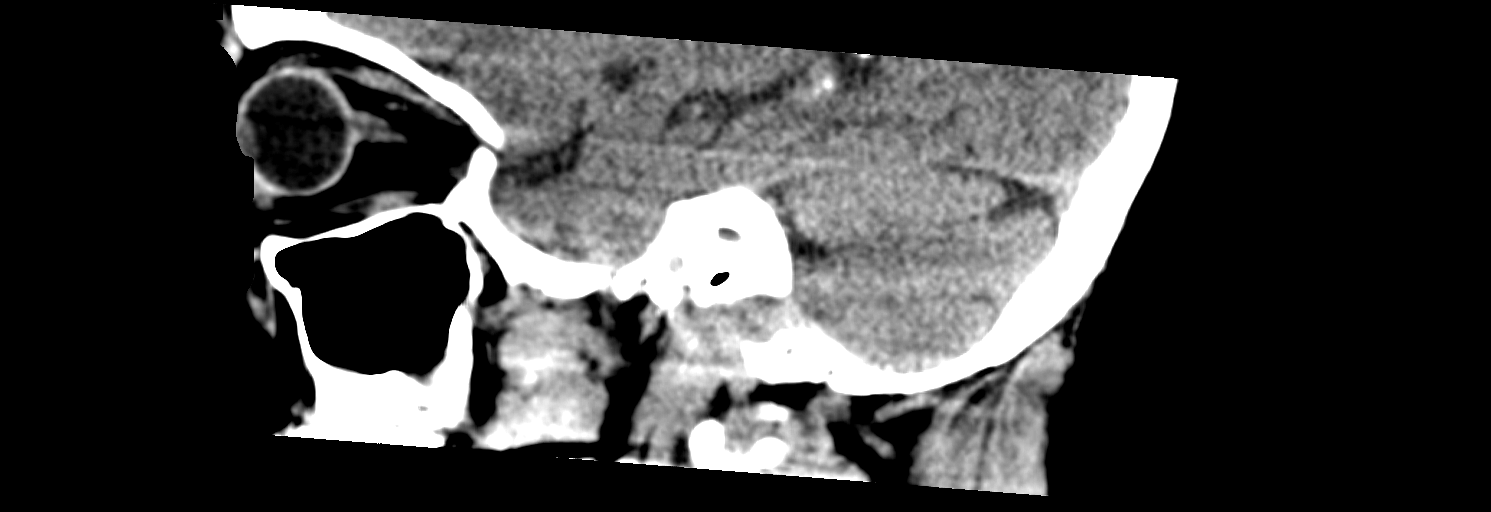
[im 37/55  brain]
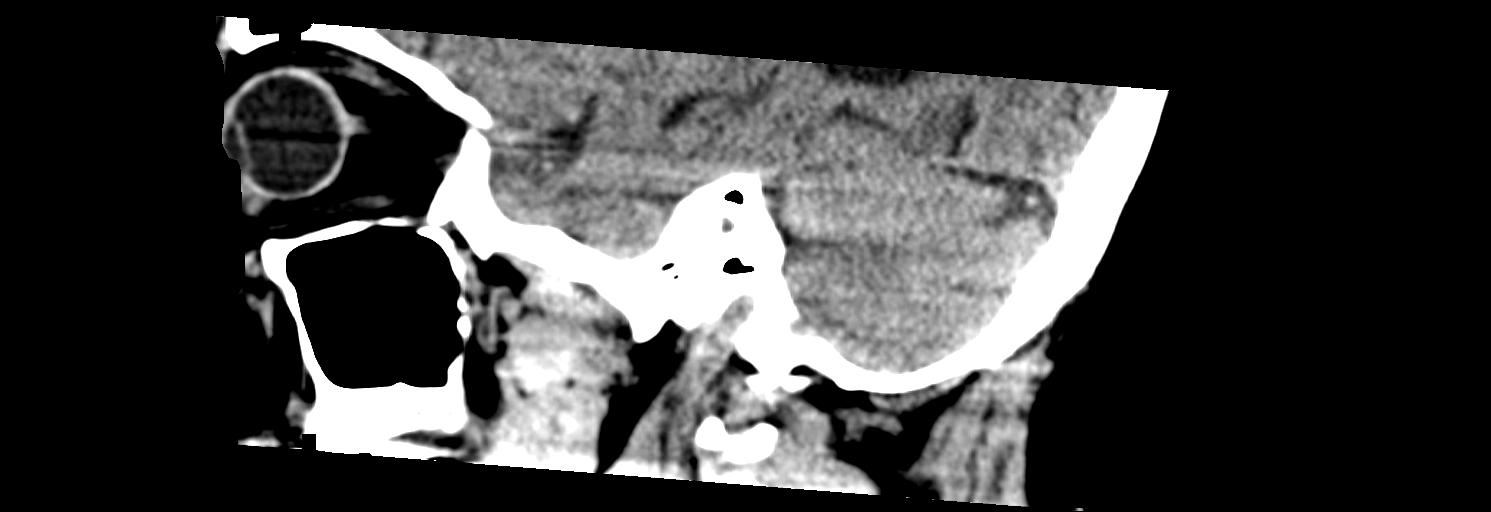

[13 of 47 positions shown; findings below may reference images not displayed]

FINDINGS: Brain: Mild cerebral atrophy. Patchy and confluent areas of
decreased attenuation are noted throughout the deep and
periventricular white matter of the cerebral hemispheres
bilaterally, compatible with chronic microvascular ischemic disease.
Well-defined areas of low attenuation in the basal ganglia
bilaterally, compatible with old lacunar infarcts. Physiologic
calcifications in the basal ganglia bilaterally incidentally noted.
No evidence of acute infarction, hemorrhage, hydrocephalus,
extra-axial collection or mass lesion/mass effect.

Vascular: No hyperdense vessel or unexpected calcification.

Skull: Normal. Negative for fracture or focal lesion.

Sinuses/Orbits: No acute finding.

Other: None.
IMPRESSION: 1. No acute intracranial abnormalities.
2. Mild cerebral atrophy with chronic microvascular ischemic changes
in the cerebral white matter and old bilateral basal ganglia lacunar
infarcts, similar to the prior study, as above.

## 2023-08-02 DIAGNOSIS — H353132 Nonexudative age-related macular degeneration, bilateral, intermediate dry stage: Secondary | ICD-10-CM | POA: Diagnosis not present

## 2023-08-02 DIAGNOSIS — H43811 Vitreous degeneration, right eye: Secondary | ICD-10-CM | POA: Diagnosis not present

## 2023-08-02 DIAGNOSIS — Z961 Presence of intraocular lens: Secondary | ICD-10-CM | POA: Diagnosis not present

## 2023-08-02 DIAGNOSIS — H04123 Dry eye syndrome of bilateral lacrimal glands: Secondary | ICD-10-CM | POA: Diagnosis not present

## 2023-08-02 DIAGNOSIS — H40013 Open angle with borderline findings, low risk, bilateral: Secondary | ICD-10-CM | POA: Diagnosis not present

## 2023-08-04 DIAGNOSIS — R41841 Cognitive communication deficit: Secondary | ICD-10-CM | POA: Diagnosis not present

## 2023-08-14 DIAGNOSIS — M6281 Muscle weakness (generalized): Secondary | ICD-10-CM | POA: Diagnosis not present

## 2023-08-14 DIAGNOSIS — M25562 Pain in left knee: Secondary | ICD-10-CM | POA: Diagnosis not present

## 2023-08-14 DIAGNOSIS — R41841 Cognitive communication deficit: Secondary | ICD-10-CM | POA: Diagnosis not present

## 2023-08-14 DIAGNOSIS — M25561 Pain in right knee: Secondary | ICD-10-CM | POA: Diagnosis not present

## 2023-08-17 DIAGNOSIS — M25561 Pain in right knee: Secondary | ICD-10-CM | POA: Diagnosis not present

## 2023-08-17 DIAGNOSIS — M6281 Muscle weakness (generalized): Secondary | ICD-10-CM | POA: Diagnosis not present

## 2023-08-17 DIAGNOSIS — M25562 Pain in left knee: Secondary | ICD-10-CM | POA: Diagnosis not present

## 2023-08-17 DIAGNOSIS — R41841 Cognitive communication deficit: Secondary | ICD-10-CM | POA: Diagnosis not present

## 2023-08-18 DIAGNOSIS — R41841 Cognitive communication deficit: Secondary | ICD-10-CM | POA: Diagnosis not present

## 2023-08-18 DIAGNOSIS — M6281 Muscle weakness (generalized): Secondary | ICD-10-CM | POA: Diagnosis not present

## 2023-08-18 DIAGNOSIS — M25562 Pain in left knee: Secondary | ICD-10-CM | POA: Diagnosis not present

## 2023-08-18 DIAGNOSIS — M25561 Pain in right knee: Secondary | ICD-10-CM | POA: Diagnosis not present

## 2023-08-22 DIAGNOSIS — M25562 Pain in left knee: Secondary | ICD-10-CM | POA: Diagnosis not present

## 2023-08-22 DIAGNOSIS — M25561 Pain in right knee: Secondary | ICD-10-CM | POA: Diagnosis not present

## 2023-08-22 DIAGNOSIS — R41841 Cognitive communication deficit: Secondary | ICD-10-CM | POA: Diagnosis not present

## 2023-08-22 DIAGNOSIS — M6281 Muscle weakness (generalized): Secondary | ICD-10-CM | POA: Diagnosis not present

## 2023-08-24 ENCOUNTER — Other Ambulatory Visit: Payer: Medicare Other

## 2023-08-24 DIAGNOSIS — M6281 Muscle weakness (generalized): Secondary | ICD-10-CM | POA: Diagnosis not present

## 2023-08-24 DIAGNOSIS — M25561 Pain in right knee: Secondary | ICD-10-CM | POA: Diagnosis not present

## 2023-08-24 DIAGNOSIS — R41841 Cognitive communication deficit: Secondary | ICD-10-CM | POA: Diagnosis not present

## 2023-08-24 DIAGNOSIS — M25562 Pain in left knee: Secondary | ICD-10-CM | POA: Diagnosis not present

## 2023-08-25 DIAGNOSIS — M25562 Pain in left knee: Secondary | ICD-10-CM | POA: Diagnosis not present

## 2023-08-25 DIAGNOSIS — M25561 Pain in right knee: Secondary | ICD-10-CM | POA: Diagnosis not present

## 2023-08-25 DIAGNOSIS — M6281 Muscle weakness (generalized): Secondary | ICD-10-CM | POA: Diagnosis not present

## 2023-08-25 DIAGNOSIS — R41841 Cognitive communication deficit: Secondary | ICD-10-CM | POA: Diagnosis not present

## 2023-08-29 DIAGNOSIS — R41841 Cognitive communication deficit: Secondary | ICD-10-CM | POA: Diagnosis not present

## 2023-08-29 DIAGNOSIS — M25561 Pain in right knee: Secondary | ICD-10-CM | POA: Diagnosis not present

## 2023-08-29 DIAGNOSIS — M25562 Pain in left knee: Secondary | ICD-10-CM | POA: Diagnosis not present

## 2023-08-29 DIAGNOSIS — M6281 Muscle weakness (generalized): Secondary | ICD-10-CM | POA: Diagnosis not present

## 2023-08-31 ENCOUNTER — Encounter: Payer: Self-pay | Admitting: Nurse Practitioner

## 2023-08-31 ENCOUNTER — Non-Acute Institutional Stay: Payer: Medicare Other | Admitting: Nurse Practitioner

## 2023-08-31 VITALS — BP 126/78 | HR 85 | Temp 97.6°F | Resp 18 | Ht 64.0 in | Wt 202.8 lb

## 2023-08-31 DIAGNOSIS — M25562 Pain in left knee: Secondary | ICD-10-CM | POA: Diagnosis not present

## 2023-08-31 DIAGNOSIS — R4020 Unspecified coma: Secondary | ICD-10-CM | POA: Diagnosis not present

## 2023-08-31 DIAGNOSIS — M6281 Muscle weakness (generalized): Secondary | ICD-10-CM | POA: Diagnosis not present

## 2023-08-31 DIAGNOSIS — E785 Hyperlipidemia, unspecified: Secondary | ICD-10-CM

## 2023-08-31 DIAGNOSIS — R7303 Prediabetes: Secondary | ICD-10-CM | POA: Diagnosis not present

## 2023-08-31 DIAGNOSIS — R7989 Other specified abnormal findings of blood chemistry: Secondary | ICD-10-CM | POA: Diagnosis not present

## 2023-08-31 DIAGNOSIS — M25561 Pain in right knee: Secondary | ICD-10-CM | POA: Diagnosis not present

## 2023-08-31 DIAGNOSIS — I10 Essential (primary) hypertension: Secondary | ICD-10-CM | POA: Diagnosis not present

## 2023-08-31 DIAGNOSIS — E43 Unspecified severe protein-calorie malnutrition: Secondary | ICD-10-CM

## 2023-08-31 DIAGNOSIS — N1831 Chronic kidney disease, stage 3a: Secondary | ICD-10-CM | POA: Diagnosis not present

## 2023-08-31 DIAGNOSIS — G40909 Epilepsy, unspecified, not intractable, without status epilepticus: Secondary | ICD-10-CM

## 2023-08-31 DIAGNOSIS — R41841 Cognitive communication deficit: Secondary | ICD-10-CM | POA: Diagnosis not present

## 2023-08-31 NOTE — Assessment & Plan Note (Signed)
Elevated TSH 5.269 03/10/23

## 2023-08-31 NOTE — Assessment & Plan Note (Signed)
LDL 125 10/04/22,  off Atorvastatin

## 2023-08-31 NOTE — Assessment & Plan Note (Signed)
Edema BLE, taking Furosemide daily

## 2023-08-31 NOTE — Assessment & Plan Note (Signed)
ED eval for unresponsive 10-15 minutes, bit her tongue, off Depakote, saw neurology 03/28/23: will try Zongran. Hospital 01/27/23-01/31/23 for seizure,EEG w/o seizure like activities, no antiepileptics recommended by Neurology,  LOC, CT head no acute process, more concerned of syncope etiology

## 2023-08-31 NOTE — Assessment & Plan Note (Signed)
Hgb A1c 6.2 03/13/23

## 2023-08-31 NOTE — Assessment & Plan Note (Signed)
Blood pressure is controlled,  taking Lisinopril.

## 2023-08-31 NOTE — Assessment & Plan Note (Signed)
Bun/creat 23/1.01 05/13/23

## 2023-08-31 NOTE — Progress Notes (Signed)
Location:   clinic [facility]   Place of Service:  Clinic (12) Provider: Chipper Oman NP  Code Status: DNR Goals of Care:     05/13/2023   10:30 AM  Advanced Directives  Does Patient Have a Medical Advance Directive? No  Would patient like information on creating a medical advance directive? No - Patient declined     Chief Complaint  Patient presents with   Medical Management of Chronic Issues    4 month follow up and discuss covid and flu vaccines.    HPI: Patient is a 82 y.o. female seen today for medical management of chronic diseases.    Syncope ED eval 05/13/23, unremarkable labs, CXR  Cardiology 04/05/23 in concern of syncope episode in May/hospitalization, on Furosemide 20mg  prn, no cardiac syncope or orthostatic hypotension,  Echo: tricuspid regurgitation, no RV dysfunction. F/u one year.              CT abd 4cm hypodense lesion left lobe of the liver, lesion from the left kidney, recommended [place]             Seizure, ED eval for unresponsive 10-15 minutes, bit her tongue, off Depakote, saw neurology 03/28/23: will try Zongran. Hospital 01/27/23-01/31/23 for seizure,EEG w/o seizure like activities, no antiepileptics recommended by Neurology,  LOC, CT head no acute process, more concerned of syncope etiology             HTN, taking Lisinopril.              Prediabetes Hgb A1c 6.2 03/13/23 Hx of hyperlipidemia, LDL 125 10/04/22,  off Atorvastatin The patient stated she sleeps better, bathroom trip only 1x/night.  CKD Bun/creat 23/1.01 05/13/23 11/22/22 Mammogram: Further evaluation is suggested for a possible mass in the left breast. Edema BLE, taking Furosemide daily Elevated TSH 5.269 03/10/23 Past Medical History:  Diagnosis Date   Arthritis    Cancer [facility])    skin   Cataract 2010   Dr. Luretha Rued, DrRomie Minus @ [facility]   Epilepsy [facility]) 08/11/2015   Hyperlipidemia 12/08/2015   Hypertension    Hypothyroid    Polyp of colon 08/11/2015    Past Surgical History:  Procedure Laterality Date    ABDOMINAL HYSTERECTOMY  1970   APPENDECTOMY  1958   BREAST SURGERY  1984   COLON SURGERY  09/12/2006   FEMUR SURGERY  2018   Broken   TONSILLECTOMY  09/12/1944    Allergies  Allergen Reactions   Barbiturates Other (See Comments)    A small amount "overdosed" the patient- "affected my stability and balance" (might have been given at the same as another med?)   Cefdinir Other (See Comments)    A small amount "overdosed" the patient- "affected my stability and balance" (might have been given at the same as another med?)   Codeine Other (See Comments)    "Moderate," per pharmacy   Phenobarbital Other (See Comments)    A small amount "overdosed" the patient- "affected my stability and balance" (might have been given at the same as another med?)   Tegretol [Carbamazepine] Other (See Comments)    A small amount "overdosed" the patient- "affected my stability and balance" (might have been given at the same as another med?)   Tramadol Other (See Comments)    "Moderate," per pharmacy   Zonisamide Other (See Comments)    "Negative reaction" (Zonegran)    Allergies as of 08/31/2023       Reactions   Barbiturates Other (See Comments)  A small amount "overdosed" the patient- "affected my stability and balance" (might have been given at the same as another med?)   Cefdinir Other (See Comments)   A small amount "overdosed" the patient- "affected my stability and balance" (might have been given at the same as another med?)   Codeine Other (See Comments)   "Moderate," per pharmacy   Phenobarbital Other (See Comments)   A small amount "overdosed" the patient- "affected my stability and balance" (might have been given at the same as another med?)   Tegretol [carbamazepine] Other (See Comments)   A small amount "overdosed" the patient- "affected my stability and balance" (might have been given at the same as another med?)   Tramadol Other (See Comments)   "Moderate," per pharmacy    Zonisamide Other (See Comments)   "Negative reaction" (Zonegran)        Medication List        Accurate as of August 31, 2023  4:04 PM. If you have any questions, ask your nurse or doctor.          furosemide 20 MG tablet Commonly known as: LASIX Take 1 tablet (20 mg total) by mouth daily.   ketoconazole 2 % shampoo Commonly known as: NIZORAL Apply 1 application  topically once a week. As needed   lisinopril 10 MG tablet Commonly known as: ZESTRIL Take 1 tablet (10 mg total) by mouth daily.   One-A-Day Womens 50 Plus Tabs Take 1 tablet by mouth daily with breakfast.   PreserVision AREDS 2 Caps Take 2 capsules by mouth daily.   Systane 0.4-0.3 % Soln Generic drug: Polyethyl Glycol-Propyl Glycol Place 1-2 drops into both eyes 3 (three) times daily as needed (for dryness).   Vitamin D-3 25 MCG (1000 UT) Caps TAKE 1 CAPSULE DAILY WITH BREAKFAST.   zonisamide 100 MG capsule Commonly known as: ZONEGRAN Take 1 capsule (100 mg total) by mouth at bedtime.        Review of Systems:  Review of Systems  Constitutional:  Negative for fatigue, fever and unexpected weight change.  HENT:  Positive for hearing loss. Negative for congestion and voice change.   Respiratory:  Negative for cough, chest tightness, shortness of breath and wheezing.   Cardiovascular:  Positive for leg swelling. Negative for chest pain and palpitations.  Gastrointestinal:  Negative for abdominal distention, abdominal pain, constipation and diarrhea.  Genitourinary:  Negative for difficulty urinating, dysuria and urgency.  Musculoskeletal:  Positive for arthralgias and gait problem.  Skin:  Negative for color change.  Neurological:  Negative for dizziness, seizures, speech difficulty, weakness and headaches.  Psychiatric/Behavioral:  Negative for behavioral problems and sleep disturbance. The patient is not nervous/anxious.     Health Maintenance  Topic Date Due   INFLUENZA VACCINE   04/13/2023   COVID-19 Vaccine (5 - 2024-25 season) 07/22/2023   Medicare Annual Wellness (AWV)  11/22/2023   DTaP/Tdap/Td (3 - Td or Tdap) 12/21/2025   Pneumonia Vaccine 90+ Years old  Completed   DEXA SCAN  Completed   Zoster Vaccines- Shingrix  Completed   HPV VACCINES  Aged Out    Physical Exam: Vitals:   08/31/23 1336  BP: 126/78  Pulse: 85  Resp: 18  Temp: 97.6 F (36.4 C)  SpO2: 94%  Weight: 202 lb 12.8 oz (92 kg)  Height: 5\' 4"  (1.626 m)   Body mass index is 34.81 kg/m. Physical Exam Vitals and nursing note reviewed.  Constitutional:      Appearance: Normal appearance.  She is obese.  HENT:     Head: Normocephalic and atraumatic.     Nose: Nose normal.     Mouth/Throat:     Mouth: Mucous membranes are moist.  Eyes:     Extraocular Movements: Extraocular movements intact.     Conjunctiva/sclera: Conjunctivae normal.     Pupils: Pupils are equal, round, and reactive to light.  Cardiovascular:     Rate and Rhythm: Normal rate and regular rhythm.     Heart sounds: No murmur heard. Pulmonary:     Effort: Pulmonary effort is normal.     Breath sounds: No wheezing, rhonchi or rales.  Abdominal:     General: Bowel sounds are normal.     Palpations: Abdomen is soft.     Tenderness: There is no abdominal tenderness.  Musculoskeletal:     Cervical back: Normal range of motion and neck supple.     Right lower leg: Edema present.     Left lower leg: Edema present.     Comments: Trace edema BLE R>L  Skin:    General: Skin is warm and dry.  Neurological:     General: No focal deficit present.     Mental Status: She is alert and oriented to person, place, and time. Mental status is at baseline.     Motor: No weakness.     Coordination: Coordination normal.     Gait: Gait abnormal.  Psychiatric:        Mood and Affect: Mood normal.        Behavior: Behavior normal.        Thought Content: Thought content normal.        Judgment: Judgment normal.     Labs  reviewed: Basic Metabolic Panel: Recent Labs    01/27/23 1510 01/28/23 0043 01/31/23 0303 02/14/23 0734 03/10/23 1900 03/11/23 0022 03/11/23 0432 05/13/23 1025  NA  --    < > 136   < > 138  --  137 137  K  --    < > 3.2*   < > 4.1  --  3.6 3.4*  CL  --    < > 101   < > 97*  --  103 101  CO2  --    < > 26   < > 25  --  24 24  GLUCOSE  --    < > 134*   < > 139*  --  123* 178*  BUN  --    < > 15   < > 17  --  13 23  CREATININE  --    < > 1.12*   < > 1.09* 0.95 1.03* 1.01*  CALCIUM  --    < > 8.3*   < > 9.8  --  8.8* 8.9  MG  --    < > 1.9  --  2.1  --  1.9  --   PHOS  --   --   --   --   --   --  3.5  --   TSH 4.566*  --   --   --  5.269*  --   --   --    < > = values in this interval not displayed.   Liver Function Tests: Recent Labs    01/27/23 1510 02/14/23 0734 03/10/23 1900  AST 22 16 24   ALT 20 20 17   ALKPHOS 70  --  89  BILITOT 0.2* 0.5 0.3  PROT 7.0 7.0 7.3  ALBUMIN 3.5  --  3.6   Recent Labs    01/27/23 1510  LIPASE 57*   Recent Labs    03/10/23 1900  AMMONIA 15   CBC: Recent Labs    02/14/23 0734 03/10/23 1900 03/11/23 0022 03/11/23 0432 05/13/23 1025  WBC 8.4 12.0* 12.3* 11.8* 11.2*  NEUTROABS 4,427 8.7*  --   --  6.5  HGB 12.8 12.7 12.2 11.7* 12.7  HCT 38.8 40.4 38.5 36.9 41.0  MCV 85.5 89.2 88.7 89.1 90.5  PLT 371 266 253 246 269   Lipid Panel: Recent Labs    10/04/22 0734  CHOL 202*  HDL 52  LDLCALC 125*  TRIG 136  CHOLHDL 3.9   Lab Results  Component Value Date   HGBA1C 6.2 (H) 03/13/2023    Procedures since last visit: No results found.  Assessment/Plan  Loss of consciousness (HCC) Syncope ED eval 05/13/23, unremarkable labs, CXR  Cardiology 04/05/23 in concern of syncope episode in May/hospitalization, on Furosemide 20mg  prn, no cardiac syncope or orthostatic hypotension,  Echo: tricuspid regurgitation, no RV dysfunction. F/u one year.   Seizure disorder [facility]) ED eval for unresponsive 10-15 minutes, bit her tongue,  off Depakote, saw neurology 03/28/23: will try Zongran. Hospital 01/27/23-01/31/23 for seizure,EEG w/o seizure like activities, no antiepileptics recommended by Neurology,  LOC, CT head no acute process, more concerned of syncope etiology  HTN (hypertension) Blood pressure is controlled,  taking Lisinopril.   Prediabetes Hgb A1c 6.2 03/13/23  Hyperlipidemia  LDL 125 10/04/22,  off Atorvastatin  CKD (chronic kidney disease) stage 3, GFR 30-59 ml/min (HCC) Bun/creat 23/1.01 05/13/23  Edema Edema BLE, taking Furosemide daily  Abnormal TSH Elevated TSH 5.269 03/10/23   Labs/tests ordered:  none  Next appt:  6 months

## 2023-08-31 NOTE — Assessment & Plan Note (Signed)
Syncope ED eval 05/13/23, unremarkable labs, CXR  Cardiology 04/05/23 in concern of syncope episode in May/hospitalization, on Furosemide 20mg  prn, no cardiac syncope or orthostatic hypotension,  Echo: tricuspid regurgitation, no RV dysfunction. F/u one year.

## 2023-09-15 DIAGNOSIS — M25561 Pain in right knee: Secondary | ICD-10-CM | POA: Diagnosis not present

## 2023-09-15 DIAGNOSIS — R41841 Cognitive communication deficit: Secondary | ICD-10-CM | POA: Diagnosis not present

## 2023-09-15 DIAGNOSIS — M25562 Pain in left knee: Secondary | ICD-10-CM | POA: Diagnosis not present

## 2023-09-15 DIAGNOSIS — M6281 Muscle weakness (generalized): Secondary | ICD-10-CM | POA: Diagnosis not present

## 2023-09-19 DIAGNOSIS — M25561 Pain in right knee: Secondary | ICD-10-CM | POA: Diagnosis not present

## 2023-09-19 DIAGNOSIS — R41841 Cognitive communication deficit: Secondary | ICD-10-CM | POA: Diagnosis not present

## 2023-09-19 DIAGNOSIS — M25562 Pain in left knee: Secondary | ICD-10-CM | POA: Diagnosis not present

## 2023-09-19 DIAGNOSIS — M6281 Muscle weakness (generalized): Secondary | ICD-10-CM | POA: Diagnosis not present

## 2023-09-21 DIAGNOSIS — M6281 Muscle weakness (generalized): Secondary | ICD-10-CM | POA: Diagnosis not present

## 2023-09-21 DIAGNOSIS — M25561 Pain in right knee: Secondary | ICD-10-CM | POA: Diagnosis not present

## 2023-09-21 DIAGNOSIS — M25562 Pain in left knee: Secondary | ICD-10-CM | POA: Diagnosis not present

## 2023-09-21 DIAGNOSIS — R41841 Cognitive communication deficit: Secondary | ICD-10-CM | POA: Diagnosis not present

## 2023-09-26 DIAGNOSIS — L821 Other seborrheic keratosis: Secondary | ICD-10-CM | POA: Diagnosis not present

## 2023-09-26 DIAGNOSIS — L570 Actinic keratosis: Secondary | ICD-10-CM | POA: Diagnosis not present

## 2023-09-26 DIAGNOSIS — L814 Other melanin hyperpigmentation: Secondary | ICD-10-CM | POA: Diagnosis not present

## 2023-09-29 DIAGNOSIS — R41841 Cognitive communication deficit: Secondary | ICD-10-CM | POA: Diagnosis not present

## 2023-09-29 DIAGNOSIS — M25562 Pain in left knee: Secondary | ICD-10-CM | POA: Diagnosis not present

## 2023-09-29 DIAGNOSIS — M25561 Pain in right knee: Secondary | ICD-10-CM | POA: Diagnosis not present

## 2023-09-29 DIAGNOSIS — M6281 Muscle weakness (generalized): Secondary | ICD-10-CM | POA: Diagnosis not present

## 2023-10-02 ENCOUNTER — Telehealth: Payer: Self-pay | Admitting: Neurology

## 2023-10-02 NOTE — Telephone Encounter (Signed)
Pt cx appt due to trouble with transportation due to weather. Will call back to r/s

## 2023-10-03 ENCOUNTER — Ambulatory Visit: Payer: Medicare Other | Admitting: Adult Health

## 2023-10-03 DIAGNOSIS — M6281 Muscle weakness (generalized): Secondary | ICD-10-CM | POA: Diagnosis not present

## 2023-10-03 DIAGNOSIS — M25562 Pain in left knee: Secondary | ICD-10-CM | POA: Diagnosis not present

## 2023-10-03 DIAGNOSIS — R41841 Cognitive communication deficit: Secondary | ICD-10-CM | POA: Diagnosis not present

## 2023-10-03 DIAGNOSIS — M25561 Pain in right knee: Secondary | ICD-10-CM | POA: Diagnosis not present

## 2023-10-05 DIAGNOSIS — M6281 Muscle weakness (generalized): Secondary | ICD-10-CM | POA: Diagnosis not present

## 2023-10-05 DIAGNOSIS — M25561 Pain in right knee: Secondary | ICD-10-CM | POA: Diagnosis not present

## 2023-10-05 DIAGNOSIS — M25562 Pain in left knee: Secondary | ICD-10-CM | POA: Diagnosis not present

## 2023-10-05 DIAGNOSIS — R41841 Cognitive communication deficit: Secondary | ICD-10-CM | POA: Diagnosis not present

## 2023-10-06 DIAGNOSIS — M6281 Muscle weakness (generalized): Secondary | ICD-10-CM | POA: Diagnosis not present

## 2023-10-06 DIAGNOSIS — M25561 Pain in right knee: Secondary | ICD-10-CM | POA: Diagnosis not present

## 2023-10-06 DIAGNOSIS — R41841 Cognitive communication deficit: Secondary | ICD-10-CM | POA: Diagnosis not present

## 2023-10-06 DIAGNOSIS — M25562 Pain in left knee: Secondary | ICD-10-CM | POA: Diagnosis not present

## 2023-10-12 DIAGNOSIS — R41841 Cognitive communication deficit: Secondary | ICD-10-CM | POA: Diagnosis not present

## 2023-10-12 DIAGNOSIS — M6281 Muscle weakness (generalized): Secondary | ICD-10-CM | POA: Diagnosis not present

## 2023-10-12 DIAGNOSIS — M25561 Pain in right knee: Secondary | ICD-10-CM | POA: Diagnosis not present

## 2023-10-12 DIAGNOSIS — M25562 Pain in left knee: Secondary | ICD-10-CM | POA: Diagnosis not present

## 2023-10-13 DIAGNOSIS — R41841 Cognitive communication deficit: Secondary | ICD-10-CM | POA: Diagnosis not present

## 2023-10-13 DIAGNOSIS — M25561 Pain in right knee: Secondary | ICD-10-CM | POA: Diagnosis not present

## 2023-10-13 DIAGNOSIS — M6281 Muscle weakness (generalized): Secondary | ICD-10-CM | POA: Diagnosis not present

## 2023-10-13 DIAGNOSIS — M25562 Pain in left knee: Secondary | ICD-10-CM | POA: Diagnosis not present

## 2023-10-17 DIAGNOSIS — M25562 Pain in left knee: Secondary | ICD-10-CM | POA: Diagnosis not present

## 2023-10-17 DIAGNOSIS — M25561 Pain in right knee: Secondary | ICD-10-CM | POA: Diagnosis not present

## 2023-10-17 DIAGNOSIS — M6281 Muscle weakness (generalized): Secondary | ICD-10-CM | POA: Diagnosis not present

## 2023-10-17 DIAGNOSIS — R41841 Cognitive communication deficit: Secondary | ICD-10-CM | POA: Diagnosis not present

## 2023-10-19 DIAGNOSIS — M25562 Pain in left knee: Secondary | ICD-10-CM | POA: Diagnosis not present

## 2023-10-19 DIAGNOSIS — R41841 Cognitive communication deficit: Secondary | ICD-10-CM | POA: Diagnosis not present

## 2023-10-19 DIAGNOSIS — M25561 Pain in right knee: Secondary | ICD-10-CM | POA: Diagnosis not present

## 2023-10-19 DIAGNOSIS — M6281 Muscle weakness (generalized): Secondary | ICD-10-CM | POA: Diagnosis not present

## 2023-10-20 DIAGNOSIS — M25561 Pain in right knee: Secondary | ICD-10-CM | POA: Diagnosis not present

## 2023-10-20 DIAGNOSIS — M25562 Pain in left knee: Secondary | ICD-10-CM | POA: Diagnosis not present

## 2023-10-20 DIAGNOSIS — M6281 Muscle weakness (generalized): Secondary | ICD-10-CM | POA: Diagnosis not present

## 2023-10-20 DIAGNOSIS — R41841 Cognitive communication deficit: Secondary | ICD-10-CM | POA: Diagnosis not present

## 2023-10-24 DIAGNOSIS — M25562 Pain in left knee: Secondary | ICD-10-CM | POA: Diagnosis not present

## 2023-10-24 DIAGNOSIS — M6281 Muscle weakness (generalized): Secondary | ICD-10-CM | POA: Diagnosis not present

## 2023-10-24 DIAGNOSIS — M25561 Pain in right knee: Secondary | ICD-10-CM | POA: Diagnosis not present

## 2023-10-24 DIAGNOSIS — R41841 Cognitive communication deficit: Secondary | ICD-10-CM | POA: Diagnosis not present

## 2023-10-26 DIAGNOSIS — R41841 Cognitive communication deficit: Secondary | ICD-10-CM | POA: Diagnosis not present

## 2023-10-26 DIAGNOSIS — M25562 Pain in left knee: Secondary | ICD-10-CM | POA: Diagnosis not present

## 2023-10-26 DIAGNOSIS — M6281 Muscle weakness (generalized): Secondary | ICD-10-CM | POA: Diagnosis not present

## 2023-10-26 DIAGNOSIS — M25561 Pain in right knee: Secondary | ICD-10-CM | POA: Diagnosis not present

## 2023-10-27 DIAGNOSIS — R41841 Cognitive communication deficit: Secondary | ICD-10-CM | POA: Diagnosis not present

## 2023-10-27 DIAGNOSIS — M25561 Pain in right knee: Secondary | ICD-10-CM | POA: Diagnosis not present

## 2023-10-27 DIAGNOSIS — M6281 Muscle weakness (generalized): Secondary | ICD-10-CM | POA: Diagnosis not present

## 2023-10-27 DIAGNOSIS — M25562 Pain in left knee: Secondary | ICD-10-CM | POA: Diagnosis not present

## 2023-10-31 DIAGNOSIS — M25561 Pain in right knee: Secondary | ICD-10-CM | POA: Diagnosis not present

## 2023-10-31 DIAGNOSIS — M25562 Pain in left knee: Secondary | ICD-10-CM | POA: Diagnosis not present

## 2023-10-31 DIAGNOSIS — R41841 Cognitive communication deficit: Secondary | ICD-10-CM | POA: Diagnosis not present

## 2023-10-31 DIAGNOSIS — M6281 Muscle weakness (generalized): Secondary | ICD-10-CM | POA: Diagnosis not present

## 2023-11-02 ENCOUNTER — Non-Acute Institutional Stay: Payer: Medicare Other | Admitting: Nurse Practitioner

## 2023-11-02 ENCOUNTER — Encounter: Payer: Self-pay | Admitting: Nurse Practitioner

## 2023-11-02 VITALS — BP 138/84 | HR 78 | Temp 98.6°F | Ht 64.0 in | Wt 201.8 lb

## 2023-11-02 DIAGNOSIS — G40909 Epilepsy, unspecified, not intractable, without status epilepticus: Secondary | ICD-10-CM | POA: Diagnosis not present

## 2023-11-02 DIAGNOSIS — I10 Essential (primary) hypertension: Secondary | ICD-10-CM | POA: Diagnosis not present

## 2023-11-02 DIAGNOSIS — M6281 Muscle weakness (generalized): Secondary | ICD-10-CM | POA: Diagnosis not present

## 2023-11-02 DIAGNOSIS — R269 Unspecified abnormalities of gait and mobility: Secondary | ICD-10-CM | POA: Diagnosis not present

## 2023-11-02 DIAGNOSIS — R41841 Cognitive communication deficit: Secondary | ICD-10-CM | POA: Diagnosis not present

## 2023-11-02 DIAGNOSIS — E43 Unspecified severe protein-calorie malnutrition: Secondary | ICD-10-CM | POA: Diagnosis not present

## 2023-11-02 DIAGNOSIS — M25561 Pain in right knee: Secondary | ICD-10-CM | POA: Diagnosis not present

## 2023-11-02 DIAGNOSIS — M25562 Pain in left knee: Secondary | ICD-10-CM | POA: Diagnosis not present

## 2023-11-02 NOTE — Assessment & Plan Note (Signed)
The patient has chronic knee pain, L>R, needs four wheel  Rollator rolling walker for ambulation. Currently the patient is working with therapy for gait, strengthening, and balance.

## 2023-11-02 NOTE — Assessment & Plan Note (Signed)
No active seizure episode since on Zongran, followed by neurology.

## 2023-11-02 NOTE — Assessment & Plan Note (Signed)
Blood pressure is controlled, not taking Lisinopril.

## 2023-11-02 NOTE — Assessment & Plan Note (Signed)
Minimal, taking Furosemide.

## 2023-11-02 NOTE — Progress Notes (Signed)
Location:   Clinic [facility]   Place of Service:  Clinic (12) Provider: Arna Snipe Damany Eastman NP  Shellby Schlink X, NP  Patient Care Team: Treshaun Carrico X, NP as PCP - General (Internal Medicine) Christell Constant, MD as PCP - Cardiology (Cardiology) Solmon Ice, MD as Referring Physician (Neurology) Shela Commons, Kelton Pillar, MD as Referring Physician (Dermatology)  Extended Emergency Contact Information Primary Emergency Contact: Claud,James Address: [address]          [place], [place] [address] [place] Phone: [phone] Relation: Son Secondary Emergency Contact: Rossano,Caroline Address: [address]          [place], [place] [address] [place] Home Phone: [phone] Work Phone: [phone] Mobile Phone: [phone] Relation: Daughter  Code Status:  DNR Goals of care: Advanced Directive information    05/13/2023   10:30 AM  Advanced Directives  Does Patient Have a Medical Advance Directive? No  Would patient like information on creating a medical advance directive? No - Patient declined     Chief Complaint  Patient presents with   DME Order Request    Patient walked into the [facility] clinic requesting an order for a new walker as she is using a loaner. Patient states she broke her thigh bone in 2018 and that is the source of her decline in mobility. Patient seen physical therapist today and was told they need a signed order from PCP> Moderate fall risk.     HPI:  Pt is a 83 y.o. female seen today for persisted gait abnormality  The patient has chronic knee pain, L>R, needs four wheel  Rollator rolling walker for ambulation. Currently the patient is working with therapy for gait, strengthening, and balance.  Syncope ED eval 05/13/23, unremarkable labs, CXR             Cardiology 04/05/23 in concern of syncope episode in May/hospitalization, on Furosemide 20mg  prn, no cardiac syncope or orthostatic hypotension,  Echo: tricuspid regurgitation, no RV  dysfunction. F/u one year.              CT abd 4cm hypodense lesion left lobe of the liver, lesion from the left kidney, recommended [place]             Seizure, ED eval for unresponsive 10-15 minutes, bit her tongue, off Depakote, saw neurology 03/28/23: placed on Zongran. Hospital 01/27/23-01/31/23 for seizure,EEG w/o seizure like activities, no antiepileptics recommended by Neurology,  LOC, CT head no acute process, more concerned of syncope etiology             HTN, off Lisinopril.              Prediabetes Hgb A1c 6.2 03/13/23 Hx of hyperlipidemia, LDL 125 10/04/22,  off Atorvastatin The patient stated she sleeps better, bathroom trip only 1x/night.  CKD Bun/creat 23/1.01 05/13/23 11/22/22 Mammogram: Further evaluation is suggested for a possible mass in the left breast. Edema BLE, taking Furosemide daily Elevated TSH 5.269 03/10/23  Past Medical History:  Diagnosis Date   Arthritis    Cancer [facility])    skin   Cataract 2010   Dr. Luretha Rued, DrRomie Minus @ [facility]   Epilepsy [facility]) 08/11/2015   Hyperlipidemia 12/08/2015   Hypertension    Hypothyroid    Polyp of colon 08/11/2015   Past Surgical History:  Procedure Laterality Date   ABDOMINAL HYSTERECTOMY  1970   APPENDECTOMY  1958   BREAST SURGERY  1984   COLON SURGERY  09/12/2006  FEMUR SURGERY  2018   Broken   TONSILLECTOMY  09/12/1944    Allergies  Allergen Reactions   Barbiturates Other (See Comments)    A small amount "overdosed" the patient- "affected my stability and balance" (might have been given at the same as another med?)   Cefdinir Other (See Comments)    A small amount "overdosed" the patient- "affected my stability and balance" (might have been given at the same as another med?)   Codeine Other (See Comments)    "Moderate," per pharmacy   Phenobarbital Other (See Comments)    A small amount "overdosed" the patient- "affected my stability and balance" (might have been given at the same as another med?)   Tegretol [Carbamazepine]  Other (See Comments)    A small amount "overdosed" the patient- "affected my stability and balance" (might have been given at the same as another med?)   Tramadol Other (See Comments)    "Moderate," per pharmacy   Zonisamide Other (See Comments)    "Negative reaction" (Zonegran)    Allergies as of 11/02/2023       Reactions   Barbiturates Other (See Comments)   A small amount "overdosed" the patient- "affected my stability and balance" (might have been given at the same as another med?)   Cefdinir Other (See Comments)   A small amount "overdosed" the patient- "affected my stability and balance" (might have been given at the same as another med?)   Codeine Other (See Comments)   "Moderate," per pharmacy   Phenobarbital Other (See Comments)   A small amount "overdosed" the patient- "affected my stability and balance" (might have been given at the same as another med?)   Tegretol [carbamazepine] Other (See Comments)   A small amount "overdosed" the patient- "affected my stability and balance" (might have been given at the same as another med?)   Tramadol Other (See Comments)   "Moderate," per pharmacy   Zonisamide Other (See Comments)   "Negative reaction" (Zonegran)        Medication List        Accurate as of November 02, 2023  4:15 PM. If you have any questions, ask your nurse or doctor.          furosemide 20 MG tablet Commonly known as: LASIX Take 1 tablet (20 mg total) by mouth daily.   ketoconazole 2 % shampoo Commonly known as: NIZORAL Apply 1 application  topically once a week. As needed   lisinopril 10 MG tablet Commonly known as: ZESTRIL Take 1 tablet (10 mg total) by mouth daily.   One-A-Day Womens 50 Plus Tabs Take 1 tablet by mouth daily with breakfast.   PreserVision AREDS 2 Caps Take 2 capsules by mouth daily.   Systane 0.4-0.3 % Soln Generic drug: Polyethyl Glycol-Propyl Glycol Place 1-2 drops into both eyes 3 (three) times daily as needed (for  dryness).   Vitamin D-3 25 MCG (1000 UT) Caps TAKE 1 CAPSULE DAILY WITH BREAKFAST.   zonisamide 100 MG capsule Commonly known as: ZONEGRAN Take 1 capsule (100 mg total) by mouth at bedtime.        Review of Systems  Constitutional:  Negative for appetite change, fatigue and fever.  HENT:  Positive for hearing loss. Negative for congestion and trouble swallowing.   Eyes:  Negative for visual disturbance.  Respiratory:  Negative for shortness of breath.   Cardiovascular:  Positive for leg swelling.  Gastrointestinal:  Negative for abdominal pain.  Genitourinary:  Positive for frequency. Negative  for dysuria and urgency.  Musculoskeletal:  Positive for arthralgias and gait problem.  Skin:  Negative for color change.  Neurological:  Negative for seizures, weakness and headaches.  Psychiatric/Behavioral:  Negative for behavioral problems and sleep disturbance. The patient is not nervous/anxious.     Immunization History  Administered Date(s) Administered   Fluad Quad(high Dose 65+) 07/03/2020, 06/28/2021, 06/28/2022   Influenza, High Dose Seasonal PF 06/26/2019   Influenza-Unspecified 06/29/2015, 05/21/2016   Moderna Covid-19 Vaccine Bivalent Booster 20yrs & up 07/14/2022, 05/27/2023   Moderna Sars-Covid-2 Vaccination 09/16/2019, 10/14/2019   Pneumococcal Conjugate-13 01/19/2014   Pneumococcal Polysaccharide-23 07/28/2009   Respiratory Syncytial Virus Vaccine,Recomb Aduvanted(Arexvy) 09/09/2022   Td 07/25/2006   Tdap 12/22/2015   Zoster Recombinant(Shingrix) 05/27/2017, 11/24/2017   Pertinent  Health Maintenance Due  Topic Date Due   INFLUENZA VACCINE  Completed   DEXA SCAN  Completed      02/02/2023    8:36 AM 03/15/2023    3:36 PM 04/13/2023    1:04 PM 08/31/2023    1:30 PM 11/02/2023    3:33 PM  Fall Risk  Falls in the past year? 1 0 0 0 1  Was there an injury with Fall? 0 0 0 0 0  Fall Risk Category Calculator 2 0 0 0 2  Patient at Risk for Falls Due to History of  fall(s);Impaired balance/gait;Impaired mobility No Fall Risks No Fall Risks  History of fall(s)  Fall risk Follow up Falls evaluation completed Falls evaluation completed Falls evaluation completed  Falls evaluation completed   Functional Status Survey:    Vitals:   11/02/23 1526  BP: 138/84  Pulse: 78  Temp: 98.6 F (37 C)  TempSrc: Temporal  SpO2: 98%  Weight: 201 lb 12.8 oz (91.5 kg)  Height: 5\' 4"  (1.626 m)   Body mass index is 34.64 kg/m. Physical Exam Vitals and nursing note reviewed.  Constitutional:      Appearance: Normal appearance. She is obese.  HENT:     Head: Normocephalic and atraumatic.     Nose: Nose normal.     Mouth/Throat:     Mouth: Mucous membranes are moist.  Eyes:     Extraocular Movements: Extraocular movements intact.     Conjunctiva/sclera: Conjunctivae normal.     Pupils: Pupils are equal, round, and reactive to light.  Cardiovascular:     Rate and Rhythm: Normal rate and regular rhythm.     Heart sounds: No murmur heard. Pulmonary:     Effort: Pulmonary effort is normal.     Breath sounds: No rales.  Abdominal:     General: Bowel sounds are normal.     Palpations: Abdomen is soft.     Tenderness: There is no abdominal tenderness.  Musculoskeletal:     Cervical back: Normal range of motion and neck supple.     Right lower leg: Edema present.     Left lower leg: Edema present.     Comments: Trace edema BLE R>L  Skin:    General: Skin is warm and dry.  Neurological:     General: No focal deficit present.     Mental Status: She is alert and oriented to person, place, and time. Mental status is at baseline.     Motor: No weakness.     Coordination: Coordination normal.     Gait: Gait abnormal.  Psychiatric:        Mood and Affect: Mood normal.        Behavior: Behavior normal.  Thought Content: Thought content normal.        Judgment: Judgment normal.     Labs reviewed: Recent Labs    01/31/23 0303 02/14/23 0734  03/10/23 1900 03/11/23 0022 03/11/23 0432 05/13/23 1025  NA 136   < > 138  --  137 137  K 3.2*   < > 4.1  --  3.6 3.4*  CL 101   < > 97*  --  103 101  CO2 26   < > 25  --  24 24  GLUCOSE 134*   < > 139*  --  123* 178*  BUN 15   < > 17  --  13 23  CREATININE 1.12*   < > 1.09* 0.95 1.03* 1.01*  CALCIUM 8.3*   < > 9.8  --  8.8* 8.9  MG 1.9  --  2.1  --  1.9  --   PHOS  --   --   --   --  3.5  --    < > = values in this interval not displayed.   Recent Labs    01/27/23 1510 02/14/23 0734 03/10/23 1900  AST 22 16 24   ALT 20 20 17   ALKPHOS 70  --  89  BILITOT 0.2* 0.5 0.3  PROT 7.0 7.0 7.3  ALBUMIN 3.5  --  3.6   Recent Labs    02/14/23 0734 03/10/23 1900 03/11/23 0022 03/11/23 0432 05/13/23 1025  WBC 8.4 12.0* 12.3* 11.8* 11.2*  NEUTROABS 4,427 8.7*  --   --  6.5  HGB 12.8 12.7 12.2 11.7* 12.7  HCT 38.8 40.4 38.5 36.9 41.0  MCV 85.5 89.2 88.7 89.1 90.5  PLT 371 266 253 246 269   Lab Results  Component Value Date   TSH 5.269 (H) 03/10/2023   Lab Results  Component Value Date   HGBA1C 6.2 (H) 03/13/2023   Lab Results  Component Value Date   CHOL 202 (H) 10/04/2022   HDL 52 10/04/2022   LDLCALC 125 (H) 10/04/2022   TRIG 136 10/04/2022   CHOLHDL 3.9 10/04/2022    Significant Diagnostic Results in last 30 days:  No results found.  Assessment/Plan  Gait abnormality The patient has chronic knee pain, L>R, needs four wheel  Rollator rolling walker for ambulation. Currently the patient is working with therapy for gait, strengthening, and balance.   Essential hypertension Blood pressure is controlled, not taking Lisinopril.   Seizure disorder (HCC) No active seizure episode since on Zongran, followed by neurology.   Edema Minimal, taking Furosemide.    Family/ staff Communication: plan of care reviewed with the patient  Labs/tests ordered:  none

## 2023-11-07 DIAGNOSIS — M25561 Pain in right knee: Secondary | ICD-10-CM | POA: Diagnosis not present

## 2023-11-07 DIAGNOSIS — M25562 Pain in left knee: Secondary | ICD-10-CM | POA: Diagnosis not present

## 2023-11-07 DIAGNOSIS — R41841 Cognitive communication deficit: Secondary | ICD-10-CM | POA: Diagnosis not present

## 2023-11-07 DIAGNOSIS — M6281 Muscle weakness (generalized): Secondary | ICD-10-CM | POA: Diagnosis not present

## 2023-11-10 DIAGNOSIS — M25562 Pain in left knee: Secondary | ICD-10-CM | POA: Diagnosis not present

## 2023-11-10 DIAGNOSIS — R41841 Cognitive communication deficit: Secondary | ICD-10-CM | POA: Diagnosis not present

## 2023-11-10 DIAGNOSIS — M6281 Muscle weakness (generalized): Secondary | ICD-10-CM | POA: Diagnosis not present

## 2023-11-10 DIAGNOSIS — M25561 Pain in right knee: Secondary | ICD-10-CM | POA: Diagnosis not present

## 2023-11-14 DIAGNOSIS — M25561 Pain in right knee: Secondary | ICD-10-CM | POA: Diagnosis not present

## 2023-11-14 DIAGNOSIS — M6281 Muscle weakness (generalized): Secondary | ICD-10-CM | POA: Diagnosis not present

## 2023-11-14 DIAGNOSIS — M25562 Pain in left knee: Secondary | ICD-10-CM | POA: Diagnosis not present

## 2023-11-16 DIAGNOSIS — M25561 Pain in right knee: Secondary | ICD-10-CM | POA: Diagnosis not present

## 2023-11-16 DIAGNOSIS — M6281 Muscle weakness (generalized): Secondary | ICD-10-CM | POA: Diagnosis not present

## 2023-11-16 DIAGNOSIS — M25562 Pain in left knee: Secondary | ICD-10-CM | POA: Diagnosis not present

## 2023-11-23 DIAGNOSIS — M25562 Pain in left knee: Secondary | ICD-10-CM | POA: Diagnosis not present

## 2023-11-23 DIAGNOSIS — M6281 Muscle weakness (generalized): Secondary | ICD-10-CM | POA: Diagnosis not present

## 2023-11-23 DIAGNOSIS — M25561 Pain in right knee: Secondary | ICD-10-CM | POA: Diagnosis not present

## 2023-11-28 DIAGNOSIS — M25561 Pain in right knee: Secondary | ICD-10-CM | POA: Diagnosis not present

## 2023-11-28 DIAGNOSIS — M25562 Pain in left knee: Secondary | ICD-10-CM | POA: Diagnosis not present

## 2023-11-28 DIAGNOSIS — M6281 Muscle weakness (generalized): Secondary | ICD-10-CM | POA: Diagnosis not present

## 2023-11-30 DIAGNOSIS — M25561 Pain in right knee: Secondary | ICD-10-CM | POA: Diagnosis not present

## 2023-11-30 DIAGNOSIS — M6281 Muscle weakness (generalized): Secondary | ICD-10-CM | POA: Diagnosis not present

## 2023-11-30 DIAGNOSIS — M25562 Pain in left knee: Secondary | ICD-10-CM | POA: Diagnosis not present

## 2023-12-05 DIAGNOSIS — M25562 Pain in left knee: Secondary | ICD-10-CM | POA: Diagnosis not present

## 2023-12-05 DIAGNOSIS — M25561 Pain in right knee: Secondary | ICD-10-CM | POA: Diagnosis not present

## 2023-12-05 DIAGNOSIS — M6281 Muscle weakness (generalized): Secondary | ICD-10-CM | POA: Diagnosis not present

## 2023-12-07 DIAGNOSIS — M6281 Muscle weakness (generalized): Secondary | ICD-10-CM | POA: Diagnosis not present

## 2023-12-07 DIAGNOSIS — M25561 Pain in right knee: Secondary | ICD-10-CM | POA: Diagnosis not present

## 2023-12-07 DIAGNOSIS — M25562 Pain in left knee: Secondary | ICD-10-CM | POA: Diagnosis not present

## 2023-12-14 DIAGNOSIS — M25561 Pain in right knee: Secondary | ICD-10-CM | POA: Diagnosis not present

## 2023-12-14 DIAGNOSIS — M25562 Pain in left knee: Secondary | ICD-10-CM | POA: Diagnosis not present

## 2023-12-14 DIAGNOSIS — R41841 Cognitive communication deficit: Secondary | ICD-10-CM | POA: Diagnosis not present

## 2023-12-14 DIAGNOSIS — M6281 Muscle weakness (generalized): Secondary | ICD-10-CM | POA: Diagnosis not present

## 2023-12-19 ENCOUNTER — Encounter (HOSPITAL_COMMUNITY): Payer: Self-pay | Admitting: Internal Medicine

## 2023-12-19 DIAGNOSIS — M25561 Pain in right knee: Secondary | ICD-10-CM | POA: Diagnosis not present

## 2023-12-19 DIAGNOSIS — M6281 Muscle weakness (generalized): Secondary | ICD-10-CM | POA: Diagnosis not present

## 2023-12-19 DIAGNOSIS — M25562 Pain in left knee: Secondary | ICD-10-CM | POA: Diagnosis not present

## 2023-12-19 DIAGNOSIS — R41841 Cognitive communication deficit: Secondary | ICD-10-CM | POA: Diagnosis not present

## 2023-12-21 DIAGNOSIS — M25562 Pain in left knee: Secondary | ICD-10-CM | POA: Diagnosis not present

## 2023-12-21 DIAGNOSIS — M6281 Muscle weakness (generalized): Secondary | ICD-10-CM | POA: Diagnosis not present

## 2023-12-21 DIAGNOSIS — R41841 Cognitive communication deficit: Secondary | ICD-10-CM | POA: Diagnosis not present

## 2023-12-21 DIAGNOSIS — M25561 Pain in right knee: Secondary | ICD-10-CM | POA: Diagnosis not present

## 2023-12-25 ENCOUNTER — Telehealth: Payer: Self-pay | Admitting: Internal Medicine

## 2023-12-25 NOTE — Telephone Encounter (Signed)
 Pt is requesting a callback regarding her wanting to know why she has an order in for her to have an ECHO. Please advise

## 2023-12-25 NOTE — Telephone Encounter (Signed)
 Spoke to patient and she states that she wants to hold off on scheduling. Thank you though for helping with this.

## 2023-12-25 NOTE — Telephone Encounter (Signed)
 Pt called concerned as to why she needs a echo. Reports she received a letter stating that she needs to schedule her echo. Pt advised that this it monitor tricuspid regurgitation. Pt states that she would like a good reason as to why she needs one. Advised again that his is to follow up on the tricuspid regurgitation. Pt states that she would like for me to ask Dr. Paulita Boss for further explanation as to why she needs it.

## 2023-12-26 DIAGNOSIS — M25562 Pain in left knee: Secondary | ICD-10-CM | POA: Diagnosis not present

## 2023-12-26 DIAGNOSIS — M6281 Muscle weakness (generalized): Secondary | ICD-10-CM | POA: Diagnosis not present

## 2023-12-26 DIAGNOSIS — M25561 Pain in right knee: Secondary | ICD-10-CM | POA: Diagnosis not present

## 2023-12-26 DIAGNOSIS — R41841 Cognitive communication deficit: Secondary | ICD-10-CM | POA: Diagnosis not present

## 2023-12-28 DIAGNOSIS — M25562 Pain in left knee: Secondary | ICD-10-CM | POA: Diagnosis not present

## 2023-12-28 DIAGNOSIS — M25561 Pain in right knee: Secondary | ICD-10-CM | POA: Diagnosis not present

## 2023-12-28 DIAGNOSIS — R41841 Cognitive communication deficit: Secondary | ICD-10-CM | POA: Diagnosis not present

## 2023-12-28 DIAGNOSIS — M6281 Muscle weakness (generalized): Secondary | ICD-10-CM | POA: Diagnosis not present

## 2024-01-02 DIAGNOSIS — R41841 Cognitive communication deficit: Secondary | ICD-10-CM | POA: Diagnosis not present

## 2024-01-02 DIAGNOSIS — M6281 Muscle weakness (generalized): Secondary | ICD-10-CM | POA: Diagnosis not present

## 2024-01-02 DIAGNOSIS — M25561 Pain in right knee: Secondary | ICD-10-CM | POA: Diagnosis not present

## 2024-01-02 DIAGNOSIS — M25562 Pain in left knee: Secondary | ICD-10-CM | POA: Diagnosis not present

## 2024-01-04 DIAGNOSIS — R41841 Cognitive communication deficit: Secondary | ICD-10-CM | POA: Diagnosis not present

## 2024-01-04 DIAGNOSIS — M6281 Muscle weakness (generalized): Secondary | ICD-10-CM | POA: Diagnosis not present

## 2024-01-04 DIAGNOSIS — M25562 Pain in left knee: Secondary | ICD-10-CM | POA: Diagnosis not present

## 2024-01-04 DIAGNOSIS — M25561 Pain in right knee: Secondary | ICD-10-CM | POA: Diagnosis not present

## 2024-01-05 DIAGNOSIS — M6281 Muscle weakness (generalized): Secondary | ICD-10-CM | POA: Diagnosis not present

## 2024-01-05 DIAGNOSIS — R41841 Cognitive communication deficit: Secondary | ICD-10-CM | POA: Diagnosis not present

## 2024-01-05 DIAGNOSIS — M25562 Pain in left knee: Secondary | ICD-10-CM | POA: Diagnosis not present

## 2024-01-05 DIAGNOSIS — M25561 Pain in right knee: Secondary | ICD-10-CM | POA: Diagnosis not present

## 2024-01-11 DIAGNOSIS — R41841 Cognitive communication deficit: Secondary | ICD-10-CM | POA: Diagnosis not present

## 2024-01-11 DIAGNOSIS — M25561 Pain in right knee: Secondary | ICD-10-CM | POA: Diagnosis not present

## 2024-01-11 DIAGNOSIS — M25562 Pain in left knee: Secondary | ICD-10-CM | POA: Diagnosis not present

## 2024-01-11 DIAGNOSIS — M6281 Muscle weakness (generalized): Secondary | ICD-10-CM | POA: Diagnosis not present

## 2024-01-12 DIAGNOSIS — R41841 Cognitive communication deficit: Secondary | ICD-10-CM | POA: Diagnosis not present

## 2024-01-12 DIAGNOSIS — M25561 Pain in right knee: Secondary | ICD-10-CM | POA: Diagnosis not present

## 2024-01-12 DIAGNOSIS — M25562 Pain in left knee: Secondary | ICD-10-CM | POA: Diagnosis not present

## 2024-01-12 DIAGNOSIS — M6281 Muscle weakness (generalized): Secondary | ICD-10-CM | POA: Diagnosis not present

## 2024-01-16 DIAGNOSIS — M25562 Pain in left knee: Secondary | ICD-10-CM | POA: Diagnosis not present

## 2024-01-16 DIAGNOSIS — M25561 Pain in right knee: Secondary | ICD-10-CM | POA: Diagnosis not present

## 2024-01-16 DIAGNOSIS — R41841 Cognitive communication deficit: Secondary | ICD-10-CM | POA: Diagnosis not present

## 2024-01-16 DIAGNOSIS — M6281 Muscle weakness (generalized): Secondary | ICD-10-CM | POA: Diagnosis not present

## 2024-01-18 ENCOUNTER — Telehealth (HOSPITAL_COMMUNITY): Payer: Self-pay | Admitting: Internal Medicine

## 2024-01-18 DIAGNOSIS — M25561 Pain in right knee: Secondary | ICD-10-CM | POA: Diagnosis not present

## 2024-01-18 DIAGNOSIS — M6281 Muscle weakness (generalized): Secondary | ICD-10-CM | POA: Diagnosis not present

## 2024-01-18 DIAGNOSIS — M25562 Pain in left knee: Secondary | ICD-10-CM | POA: Diagnosis not present

## 2024-01-18 DIAGNOSIS — R41841 Cognitive communication deficit: Secondary | ICD-10-CM | POA: Diagnosis not present

## 2024-01-18 NOTE — Telephone Encounter (Signed)
 Just an FYI. We have made several attempts to contact this patient including sending a letter to schedule or reschedule their echocardiogram. We will be removing the patient from the echo WQ.   01/18/24 PT DECLINED x 2 to schedule/LBW  12/25/23 Patient declined to schedule and doesnt think she needs/LBW  MAILED LETTER  12/19/23 called and call could not be completed at this time. @ 12:49/LBW  12/18/23 Called and NVM set up @ 9:30 LBW       Thank you

## 2024-01-23 DIAGNOSIS — L219 Seborrheic dermatitis, unspecified: Secondary | ICD-10-CM | POA: Diagnosis not present

## 2024-01-23 DIAGNOSIS — M6281 Muscle weakness (generalized): Secondary | ICD-10-CM | POA: Diagnosis not present

## 2024-01-23 DIAGNOSIS — M25562 Pain in left knee: Secondary | ICD-10-CM | POA: Diagnosis not present

## 2024-01-23 DIAGNOSIS — M25561 Pain in right knee: Secondary | ICD-10-CM | POA: Diagnosis not present

## 2024-01-23 DIAGNOSIS — R41841 Cognitive communication deficit: Secondary | ICD-10-CM | POA: Diagnosis not present

## 2024-01-25 DIAGNOSIS — M6281 Muscle weakness (generalized): Secondary | ICD-10-CM | POA: Diagnosis not present

## 2024-01-25 DIAGNOSIS — R41841 Cognitive communication deficit: Secondary | ICD-10-CM | POA: Diagnosis not present

## 2024-01-25 DIAGNOSIS — M25562 Pain in left knee: Secondary | ICD-10-CM | POA: Diagnosis not present

## 2024-01-25 DIAGNOSIS — M25561 Pain in right knee: Secondary | ICD-10-CM | POA: Diagnosis not present

## 2024-01-30 DIAGNOSIS — M25562 Pain in left knee: Secondary | ICD-10-CM | POA: Diagnosis not present

## 2024-01-30 DIAGNOSIS — R41841 Cognitive communication deficit: Secondary | ICD-10-CM | POA: Diagnosis not present

## 2024-01-30 DIAGNOSIS — M25561 Pain in right knee: Secondary | ICD-10-CM | POA: Diagnosis not present

## 2024-01-30 DIAGNOSIS — M6281 Muscle weakness (generalized): Secondary | ICD-10-CM | POA: Diagnosis not present

## 2024-02-01 DIAGNOSIS — M6281 Muscle weakness (generalized): Secondary | ICD-10-CM | POA: Diagnosis not present

## 2024-02-01 DIAGNOSIS — M25562 Pain in left knee: Secondary | ICD-10-CM | POA: Diagnosis not present

## 2024-02-01 DIAGNOSIS — R41841 Cognitive communication deficit: Secondary | ICD-10-CM | POA: Diagnosis not present

## 2024-02-01 DIAGNOSIS — M25561 Pain in right knee: Secondary | ICD-10-CM | POA: Diagnosis not present

## 2024-02-05 DIAGNOSIS — M25561 Pain in right knee: Secondary | ICD-10-CM | POA: Diagnosis not present

## 2024-02-05 DIAGNOSIS — R41841 Cognitive communication deficit: Secondary | ICD-10-CM | POA: Diagnosis not present

## 2024-02-05 DIAGNOSIS — M6281 Muscle weakness (generalized): Secondary | ICD-10-CM | POA: Diagnosis not present

## 2024-02-05 DIAGNOSIS — M25562 Pain in left knee: Secondary | ICD-10-CM | POA: Diagnosis not present

## 2024-02-07 DIAGNOSIS — M25562 Pain in left knee: Secondary | ICD-10-CM | POA: Diagnosis not present

## 2024-02-07 DIAGNOSIS — R41841 Cognitive communication deficit: Secondary | ICD-10-CM | POA: Diagnosis not present

## 2024-02-07 DIAGNOSIS — M6281 Muscle weakness (generalized): Secondary | ICD-10-CM | POA: Diagnosis not present

## 2024-02-07 DIAGNOSIS — M25561 Pain in right knee: Secondary | ICD-10-CM | POA: Diagnosis not present

## 2024-02-09 DIAGNOSIS — R41841 Cognitive communication deficit: Secondary | ICD-10-CM | POA: Diagnosis not present

## 2024-02-09 DIAGNOSIS — M25561 Pain in right knee: Secondary | ICD-10-CM | POA: Diagnosis not present

## 2024-02-09 DIAGNOSIS — M25562 Pain in left knee: Secondary | ICD-10-CM | POA: Diagnosis not present

## 2024-02-09 DIAGNOSIS — M6281 Muscle weakness (generalized): Secondary | ICD-10-CM | POA: Diagnosis not present

## 2024-02-16 ENCOUNTER — Other Ambulatory Visit: Payer: Self-pay | Admitting: Neurology

## 2024-02-16 DIAGNOSIS — R41841 Cognitive communication deficit: Secondary | ICD-10-CM | POA: Diagnosis not present

## 2024-02-23 DIAGNOSIS — R41841 Cognitive communication deficit: Secondary | ICD-10-CM | POA: Diagnosis not present

## 2024-02-29 ENCOUNTER — Non-Acute Institutional Stay: Payer: Medicare Other | Admitting: Nurse Practitioner

## 2024-02-29 ENCOUNTER — Encounter: Payer: Self-pay | Admitting: Nurse Practitioner

## 2024-02-29 ENCOUNTER — Non-Acute Institutional Stay: Admitting: Nurse Practitioner

## 2024-02-29 VITALS — BP 120/60 | HR 89 | Temp 98.2°F | Resp 17 | Ht 64.0 in | Wt 205.4 lb

## 2024-02-29 DIAGNOSIS — M159 Polyosteoarthritis, unspecified: Secondary | ICD-10-CM | POA: Insufficient documentation

## 2024-02-29 DIAGNOSIS — N1831 Chronic kidney disease, stage 3a: Secondary | ICD-10-CM

## 2024-02-29 DIAGNOSIS — E43 Unspecified severe protein-calorie malnutrition: Secondary | ICD-10-CM

## 2024-02-29 DIAGNOSIS — G40909 Epilepsy, unspecified, not intractable, without status epilepticus: Secondary | ICD-10-CM | POA: Diagnosis not present

## 2024-02-29 DIAGNOSIS — R7303 Prediabetes: Secondary | ICD-10-CM | POA: Diagnosis not present

## 2024-02-29 DIAGNOSIS — M150 Primary generalized (osteo)arthritis: Secondary | ICD-10-CM

## 2024-02-29 DIAGNOSIS — E038 Other specified hypothyroidism: Secondary | ICD-10-CM | POA: Diagnosis not present

## 2024-02-29 DIAGNOSIS — E785 Hyperlipidemia, unspecified: Secondary | ICD-10-CM | POA: Diagnosis not present

## 2024-02-29 DIAGNOSIS — I10 Essential (primary) hypertension: Secondary | ICD-10-CM | POA: Diagnosis not present

## 2024-02-29 NOTE — Assessment & Plan Note (Signed)
Blood pressure is controlled, continue Lisinopril.  

## 2024-02-29 NOTE — Assessment & Plan Note (Signed)
 Hgb A1c 6.2 03/13/23, update Hgb a1c

## 2024-02-29 NOTE — Progress Notes (Unsigned)
 This encounter was created in error - please disregard.

## 2024-02-29 NOTE — Assessment & Plan Note (Signed)
 has chronic knee pain, L>R, needs four wheel  Rollator rolling walker for ambulation

## 2024-02-29 NOTE — Assessment & Plan Note (Signed)
 taking Furosemide  daily

## 2024-02-29 NOTE — Assessment & Plan Note (Signed)
 The patient has chronic knee pain, L>R, needs four wheel  Rollator rolling walker for ambulation. Currently the patient is working with therapy for gait, strengthening, and balance.

## 2024-02-29 NOTE — Assessment & Plan Note (Signed)
 TSH 5.269 03/10/23, update TSH, Hgb A1c, CBC/diff, CMP/eGFR, lipids prior to the next appts.

## 2024-02-29 NOTE — Progress Notes (Signed)
 Location:   Cl[facility]   Place of Service:  Clinic (12) Provider: Abner Hoffman Zoye Chandra NP  Ravinder Lukehart X, NP  Patient Care Team: Ponciano Shealy X, NP as PCP - General (Internal Medicine) Jann Melody, MD as PCP - Cardiology (Cardiology) Reatha Campanile, MD as Referring Physician (Neurology) Whitfield Hamper, Chip Coulter, MD as Referring Physician (Dermatology)  Extended Emergency Contact Information Primary Emergency Contact: Vancott,James Address: [address]          [place], [place] [address] [place] Mobile Phone: [phone] Relation: Son Secondary Emergency Contact: Brossman,Caroline Address: [address]          [place], [place] [address] [place] Home Phone: [phone] Work Phone: [phone] Mobile Phone: [phone] Relation: Daughter  Code Status:  DNR Goals of care: Advanced Directive information    02/29/2024    3:20 PM  Advanced Directives  Does Patient Have a Medical Advance Directive? Yes  Type of Estate agent of [place] Gonzalez;Living will;Out of facility DNR (pink MOST or yellow form)  Does patient want to make changes to medical advance directive? No - Patient declined  Copy of Healthcare Power of Attorney in Chart? Yes - validated most recent copy scanned in chart (See row information)     Chief Complaint  Patient presents with   Medical Management of Chronic Issues    6 month follow up. Discuss the need for Covid Booster, and AWV.    HPI:  Pt is a 82 y.o. female seen today for medical management of chronic diseases.    The patient has chronic knee pain, L>R, needs four wheel  Rollator rolling walker for ambulation. Currently the patient is working with therapy for gait, strengthening, and balance.   Ambulates with walker, risk of falling, knees get weak at times.  Syncope ED eval 05/13/23, unremarkable labs, CXR             Cardiology 04/05/23 in concern of syncope episode in May/hospitalization, on Furosemide  20mg   prn, no cardiac syncope or orthostatic hypotension,  Echo: tricuspid regurgitation, no RV dysfunction. F/u one year.              CT abd 4cm hypodense lesion left lobe of the liver, lesion from the left kidney, recommended US  02/15/24 1. No cholelithiasis or sonographic evidence for acute cholecystitis. 2. Increased hepatic parenchymal echogenicity suggestive of steatosis.             Seizure, ED eval for unresponsive 10-15 minutes, bit her tongue, off Depakote , saw neurology 03/28/23: placed on Zongran. Hospital 01/27/23-01/31/23 for seizure,EEG w/o seizure like activities, no antiepileptics recommended by Neurology,  LOC, CT head no acute process, more concerned of syncope etiology             HTN, resumed Lisinopril .              Prediabetes Hgb A1c 6.2 03/13/23 Hx of hyperlipidemia, LDL 125 10/04/22,  off Atorvastatin  The patient stated she sleeps better, bathroom trip only 1x/night.  CKD Bun/creat 23/1.01 05/13/23 11/22/22 Mammogram: Further evaluation is suggested for a possible mass in the left breast, repeat Mammogram 02/16/23 showed duct ectasia, cyst.  Edema BLE, taking Furosemide  daily Elevated TSH 5.269 03/10/23    Past Medical History:  Diagnosis Date   Arthritis    Cancer [facility])    skin   Cataract 2010   Dr. Meridee Standing, DrLaraine Plate @ [facility]   Epilepsy [facility]) 08/11/2015   Hyperlipidemia 12/08/2015   Hypertension  Hypothyroid    Polyp of colon 08/11/2015   Past Surgical History:  Procedure Laterality Date   ABDOMINAL HYSTERECTOMY  1970   APPENDECTOMY  1958   BREAST SURGERY  1984   COLON SURGERY  09/12/2006   FEMUR SURGERY  2018   Broken   TONSILLECTOMY  09/12/1944    Allergies  Allergen Reactions   Barbiturates Other (See Comments)    A small amount overdosed the patient- affected my stability and balance (might have been given at the same as another med?)   Cefdinir Other (See Comments)    A small amount overdosed the patient- affected my stability and balance (might have  been given at the same as another med?)   Codeine Other (See Comments)    Moderate, per pharmacy   Phenobarbital  Other (See Comments)    A small amount overdosed the patient- affected my stability and balance (might have been given at the same as another med?)   Tegretol  [Carbamazepine ] Other (See Comments)    A small amount overdosed the patient- affected my stability and balance (might have been given at the same as another med?)   Tramadol Other (See Comments)    Moderate, per pharmacy   Zonisamide  Other (See Comments)    Negative reaction (Zonegran )    Allergies as of 02/29/2024       Reactions   Barbiturates Other (See Comments)   A small amount overdosed the patient- affected my stability and balance (might have been given at the same as another med?)   Cefdinir Other (See Comments)   A small amount overdosed the patient- affected my stability and balance (might have been given at the same as another med?)   Codeine Other (See Comments)   Moderate, per pharmacy   Phenobarbital  Other (See Comments)   A small amount overdosed the patient- affected my stability and balance (might have been given at the same as another med?)   Tegretol  [carbamazepine ] Other (See Comments)   A small amount overdosed the patient- affected my stability and balance (might have been given at the same as another med?)   Tramadol Other (See Comments)   Moderate, per pharmacy   Zonisamide  Other (See Comments)   Negative reaction (Zonegran )        Medication List        Accurate as of February 29, 2024  4:34 PM. If you have any questions, ask your nurse or doctor.          furosemide  20 MG tablet Commonly known as: LASIX  Take 1 tablet (20 mg total) by mouth daily.   ketoconazole 2 % shampoo Commonly known as: NIZORAL Apply 1 application  topically once a week. As needed   lisinopril  10 MG tablet Commonly known as: ZESTRIL  Take 1 tablet (10 mg total) by  mouth daily.   One-A-Day Womens 50 Plus Tabs Take 1 tablet by mouth daily with breakfast.   PreserVision AREDS 2 Caps Take 2 capsules by mouth daily.   Systane 0.4-0.3 % Soln Generic drug: Polyethyl Glycol-Propyl Glycol Place 1-2 drops into both eyes 3 (three) times daily as needed (for dryness).   Vitamin D -3 25 MCG (1000 UT) Caps TAKE 1 CAPSULE DAILY WITH BREAKFAST.   zonisamide  100 MG capsule Commonly known as: ZONEGRAN  Take 1 capsule (100 mg total) by mouth at bedtime.        Review of Systems  Constitutional:  Negative for appetite change, fatigue and fever.  HENT:  Positive for hearing loss. Negative for  congestion and trouble swallowing.   Eyes:  Negative for visual disturbance.  Respiratory:  Negative for shortness of breath.   Cardiovascular:  Positive for leg swelling.  Gastrointestinal:  Negative for abdominal pain.  Genitourinary:  Positive for frequency. Negative for dysuria and urgency.  Musculoskeletal:  Positive for arthralgias and gait problem.  Skin:  Negative for color change.  Neurological:  Negative for seizures, weakness and headaches.  Psychiatric/Behavioral:  Negative for behavioral problems and sleep disturbance. The patient is not nervous/anxious.     Immunization History  Administered Date(s) Administered   Fluad Quad(high Dose 65+) 07/03/2020, 06/28/2021, 06/28/2022   Influenza, High Dose Seasonal PF 06/26/2019   Influenza-Unspecified 06/29/2015, 05/21/2016   Moderna Covid-19 Vaccine  Bivalent Booster 47yrs & up 07/14/2022, 05/27/2023   Moderna Sars-Covid-2 Vaccination 09/16/2019, 10/14/2019, 07/21/2020, 01/21/2021, 01/28/2022   PFIZER(Purple Top)SARS-COV-2 Vaccination 06/01/2021   Pneumococcal Conjugate-13 01/19/2014   Pneumococcal Polysaccharide-23 07/28/2009   Respiratory Syncytial Virus Vaccine,Recomb Aduvanted(Arexvy) 09/09/2022   Td 07/02/1991, 02/02/1996, 09/25/1996, 07/25/2006   Tdap 12/22/2015   Zoster Recombinant(Shingrix)  05/27/2017, 06/20/2017, 11/24/2017   Pertinent  Health Maintenance Due  Topic Date Due   INFLUENZA VACCINE  04/12/2024   DEXA SCAN  Completed      03/15/2023    3:36 PM 04/13/2023    1:04 PM 08/31/2023    1:30 PM 11/02/2023    3:33 PM 02/29/2024    3:19 PM  Fall Risk  Falls in the past year? 0 0 0 1 1  Was there an injury with Fall? 0 0 0 0 0  Fall Risk Category Calculator 0 0 0 2 1  Patient at Risk for Falls Due to No Fall Risks No Fall Risks  History of fall(s) No Fall Risks  Fall risk Follow up Falls evaluation completed Falls evaluation completed  Falls evaluation completed Falls evaluation completed   Functional Status Survey:    Vitals:   02/29/24 1514  BP: 120/60  Pulse: 89  Resp: 17  Temp: 98.2 F (36.8 C)  SpO2: 95%  Weight: 205 lb 6.4 oz (93.2 kg)  Height: 5' 4 (1.626 m)   Body mass index is 35.26 kg/m. Physical Exam Vitals and nursing note reviewed.  Constitutional:      Appearance: Normal appearance. She is obese.  HENT:     Head: Normocephalic and atraumatic.     Nose: Nose normal.     Mouth/Throat:     Mouth: Mucous membranes are moist.   Eyes:     Extraocular Movements: Extraocular movements intact.     Conjunctiva/sclera: Conjunctivae normal.     Pupils: Pupils are equal, round, and reactive to light.    Cardiovascular:     Rate and Rhythm: Normal rate and regular rhythm.     Heart sounds: No murmur heard. Pulmonary:     Effort: Pulmonary effort is normal.     Breath sounds: No rales.  Abdominal:     General: Bowel sounds are normal.     Palpations: Abdomen is soft.     Tenderness: There is no abdominal tenderness.   Musculoskeletal:     Cervical back: Normal range of motion and neck supple.     Right lower leg: Edema present.     Left lower leg: Edema present.     Comments: Trace edema BLE R>L   Skin:    General: Skin is warm and dry.   Neurological:     General: No focal deficit present.     Mental Status: She is alert  and  oriented to person, place, and time. Mental status is at baseline.     Motor: No weakness.     Coordination: Coordination normal.     Gait: Gait abnormal.   Psychiatric:        Mood and Affect: Mood normal.        Behavior: Behavior normal.        Thought Content: Thought content normal.        Judgment: Judgment normal.     Labs reviewed: Recent Labs    03/10/23 1900 03/11/23 0022 03/11/23 0432 05/13/23 1025  NA 138  --  137 137  K 4.1  --  3.6 3.4*  CL 97*  --  103 101  CO2 25  --  24 24  GLUCOSE 139*  --  123* 178*  BUN 17  --  13 23  CREATININE 1.09* 0.95 1.03* 1.01*  CALCIUM  9.8  --  8.8* 8.9  MG 2.1  --  1.9  --   PHOS  --   --  3.5  --    Recent Labs    03/10/23 1900  AST 24  ALT 17  ALKPHOS 89  BILITOT 0.3  PROT 7.3  ALBUMIN 3.6   Recent Labs    03/10/23 1900 03/11/23 0022 03/11/23 0432 05/13/23 1025  WBC 12.0* 12.3* 11.8* 11.2*  NEUTROABS 8.7*  --   --  6.5  HGB 12.7 12.2 11.7* 12.7  HCT 40.4 38.5 36.9 41.0  MCV 89.2 88.7 89.1 90.5  PLT 266 253 246 269   Lab Results  Component Value Date   TSH 5.269 (H) 03/10/2023   Lab Results  Component Value Date   HGBA1C 6.2 (H) 03/13/2023   Lab Results  Component Value Date   CHOL 202 (H) 10/04/2022   HDL 52 10/04/2022   LDLCALC 125 (H) 10/04/2022   TRIG 136 10/04/2022   CHOLHDL 3.9 10/04/2022    Significant Diagnostic Results in last 30 days:  No results found.  Assessment/Plan  Subclinical hypothyroidism Elevated TSH 5.269 03/10/23, f/u TSH  Edema taking Furosemide  daily    CKD (chronic kidney disease) stage 3, GFR 30-59 ml/min (HCC) CKD Bun/creat 23/1.01 05/13/23, update CMP/eGFR  Hyperlipidemia  LDL 125 10/04/22,  off Atorvastatin , update lipids.   Prediabetes Hgb A1c 6.2 03/13/23, update Hgb a1c  HTN (hypertension) Blood pressure is controlled, continue Lisinopril .   Seizure disorder [facility]) ED eval for unresponsive 10-15 minutes, bit her tongue, off Depakote , saw neurology  03/28/23: placed on Zongran. Hospital 01/27/23-01/31/23 for seizure,EEG w/o seizure like activities, no antiepileptics recommended by Neurology,  LOC, CT head no acute process, more concerned of syncope etiology  Osteoarthritis, multiple sites The patient has chronic knee pain, L>R, needs four wheel  Rollator rolling walker for ambulation. Currently the patient is working with therapy for gait, strengthening, and balance.    Family/ staff Communication: plan of care reviewed with the patient  Labs/tests ordered:  CBC/diff, CMP/eGFR, Lipids, Hgb A1c, TSH

## 2024-02-29 NOTE — Assessment & Plan Note (Signed)
 Zongran. Hospital 01/27/23-01/31/23 for seizure

## 2024-02-29 NOTE — Assessment & Plan Note (Signed)
 CKD Bun/creat 23/1.01 05/13/23, update CMP/eGFR

## 2024-02-29 NOTE — Assessment & Plan Note (Signed)
 ED eval for unresponsive 10-15 minutes, bit her tongue, off Depakote , saw neurology 03/28/23: placed on Zongran. Hospital 01/27/23-01/31/23 for seizure,EEG w/o seizure like activities, no antiepileptics recommended by Neurology,  LOC, CT head no acute process, more concerned of syncope etiology

## 2024-02-29 NOTE — Assessment & Plan Note (Signed)
 Controlled Bp,  off Lisinopril , only on Furosemide .

## 2024-02-29 NOTE — Assessment & Plan Note (Signed)
 LDL 125 10/04/22,  off Atorvastatin , update lipids.

## 2024-02-29 NOTE — Patient Instructions (Addendum)
 Please come to [facility] on Tuesday 03/05/2024 at 7am for labs.

## 2024-02-29 NOTE — Assessment & Plan Note (Signed)
 Elevated TSH 5.269 03/10/23, f/u TSH

## 2024-02-29 NOTE — Assessment & Plan Note (Signed)
Update Hgb a1c.  

## 2024-03-01 ENCOUNTER — Encounter: Payer: Self-pay | Admitting: Nurse Practitioner

## 2024-03-01 DIAGNOSIS — R41841 Cognitive communication deficit: Secondary | ICD-10-CM | POA: Diagnosis not present

## 2024-03-01 NOTE — Addendum Note (Signed)
 Addended by: Chakira Jachim X on: 03/01/2024 11:29 AM   Modules accepted: Orders, Level of Service

## 2024-03-05 DIAGNOSIS — E038 Other specified hypothyroidism: Secondary | ICD-10-CM | POA: Diagnosis not present

## 2024-03-05 DIAGNOSIS — R7303 Prediabetes: Secondary | ICD-10-CM | POA: Diagnosis not present

## 2024-03-05 DIAGNOSIS — N1831 Chronic kidney disease, stage 3a: Secondary | ICD-10-CM | POA: Diagnosis not present

## 2024-03-06 LAB — HEMOGLOBIN A1C
Hgb A1c MFr Bld: 6.3 % — ABNORMAL HIGH (ref <5.7)
Mean Plasma Glucose: 134 mg/dL
eAG (mmol/L): 7.4 mmol/L

## 2024-03-06 LAB — CBC WITH DIFFERENTIAL/PLATELET
Absolute Lymphocytes: 2912 {cells}/uL (ref 850–3900)
Absolute Monocytes: 801 {cells}/uL (ref 200–950)
Basophils Absolute: 62 {cells}/uL (ref 0–200)
Basophils Relative: 0.6 %
Eosinophils Absolute: 395 {cells}/uL (ref 15–500)
Eosinophils Relative: 3.8 %
HCT: 39.2 % (ref 35.0–45.0)
Hemoglobin: 12.3 g/dL (ref 11.7–15.5)
MCH: 27.8 pg (ref 27.0–33.0)
MCHC: 31.4 g/dL — ABNORMAL LOW (ref 32.0–36.0)
MCV: 88.5 fL (ref 80.0–100.0)
MPV: 9.9 fL (ref 7.5–12.5)
Monocytes Relative: 7.7 %
Neutro Abs: 6230 {cells}/uL (ref 1500–7800)
Neutrophils Relative %: 59.9 %
Platelets: 301 10*3/uL (ref 140–400)
RBC: 4.43 10*6/uL (ref 3.80–5.10)
RDW: 12.8 % (ref 11.0–15.0)
Total Lymphocyte: 28 %
WBC: 10.4 10*3/uL (ref 3.8–10.8)

## 2024-03-06 LAB — COMPLETE METABOLIC PANEL WITHOUT GFR
AG Ratio: 1.3 (calc) (ref 1.0–2.5)
ALT: 15 U/L (ref 6–29)
AST: 14 U/L (ref 10–35)
Albumin: 3.9 g/dL (ref 3.6–5.1)
Alkaline phosphatase (APISO): 100 U/L (ref 37–153)
BUN: 19 mg/dL (ref 7–25)
CO2: 26 mmol/L (ref 20–32)
Calcium: 9.2 mg/dL (ref 8.6–10.4)
Chloride: 103 mmol/L (ref 98–110)
Creat: 0.93 mg/dL (ref 0.60–0.95)
Globulin: 3 g/dL (ref 1.9–3.7)
Glucose, Bld: 102 mg/dL — ABNORMAL HIGH (ref 65–99)
Potassium: 4.1 mmol/L (ref 3.5–5.3)
Sodium: 139 mmol/L (ref 135–146)
Total Bilirubin: 0.3 mg/dL (ref 0.2–1.2)
Total Protein: 6.9 g/dL (ref 6.1–8.1)

## 2024-03-06 LAB — LIPID PANEL
Cholesterol: 201 mg/dL — ABNORMAL HIGH (ref <200)
HDL: 49 mg/dL — ABNORMAL LOW (ref >=50)
LDL Cholesterol (Calc): 130 mg/dL — ABNORMAL HIGH
Non-HDL Cholesterol (Calc): 152 mg/dL — ABNORMAL HIGH (ref <130)
Total CHOL/HDL Ratio: 4.1 (calc) (ref <5.0)
Triglycerides: 112 mg/dL (ref <150)

## 2024-03-06 LAB — TSH: TSH: 4.72 m[IU]/L — ABNORMAL HIGH (ref 0.40–4.50)

## 2024-03-08 DIAGNOSIS — R41841 Cognitive communication deficit: Secondary | ICD-10-CM | POA: Diagnosis not present

## 2024-03-11 ENCOUNTER — Ambulatory Visit: Payer: Self-pay | Admitting: Nurse Practitioner

## 2024-03-12 DIAGNOSIS — R41841 Cognitive communication deficit: Secondary | ICD-10-CM | POA: Diagnosis not present

## 2024-03-14 DIAGNOSIS — R41841 Cognitive communication deficit: Secondary | ICD-10-CM | POA: Diagnosis not present

## 2024-03-19 DIAGNOSIS — R41841 Cognitive communication deficit: Secondary | ICD-10-CM | POA: Diagnosis not present

## 2024-03-22 DIAGNOSIS — R41841 Cognitive communication deficit: Secondary | ICD-10-CM | POA: Diagnosis not present

## 2024-03-28 ENCOUNTER — Ambulatory Visit: Admitting: Nurse Practitioner

## 2024-03-28 ENCOUNTER — Encounter: Payer: Self-pay | Admitting: Nurse Practitioner

## 2024-03-28 VITALS — BP 134/70 | HR 94 | Temp 98.0°F | Ht 64.0 in | Wt 200.0 lb

## 2024-03-28 DIAGNOSIS — R7303 Prediabetes: Secondary | ICD-10-CM

## 2024-03-28 DIAGNOSIS — G40909 Epilepsy, unspecified, not intractable, without status epilepticus: Secondary | ICD-10-CM | POA: Diagnosis not present

## 2024-03-28 DIAGNOSIS — I10 Essential (primary) hypertension: Secondary | ICD-10-CM | POA: Diagnosis not present

## 2024-03-28 DIAGNOSIS — J069 Acute upper respiratory infection, unspecified: Secondary | ICD-10-CM

## 2024-03-28 DIAGNOSIS — E038 Other specified hypothyroidism: Secondary | ICD-10-CM | POA: Diagnosis not present

## 2024-03-28 DIAGNOSIS — E785 Hyperlipidemia, unspecified: Secondary | ICD-10-CM

## 2024-03-28 DIAGNOSIS — J209 Acute bronchitis, unspecified: Secondary | ICD-10-CM | POA: Insufficient documentation

## 2024-03-28 DIAGNOSIS — N1831 Chronic kidney disease, stage 3a: Secondary | ICD-10-CM | POA: Diagnosis not present

## 2024-03-28 MED ORDER — BENZONATATE 100 MG PO CAPS
100.0000 mg | ORAL_CAPSULE | Freq: Three times a day (TID) | ORAL | 0 refills | Status: DC | PRN
Start: 2024-03-28 — End: 2024-04-04

## 2024-03-28 NOTE — Assessment & Plan Note (Signed)
 ED eval for unresponsive 10-15 minutes, bit her tongue, off Depakote , saw neurology 03/28/23: placed on Zongran. Hospital 01/27/23-01/31/23 for seizure,EEG w/o seizure like activities, no antiepileptics recommended by Neurology,  LOC, CT head no acute process, more concerned of syncope etiology

## 2024-03-28 NOTE — Progress Notes (Signed)
 Location:   [facility]   Place of Service:    Provider: Larwance Hark NP  Genevive Printup X, NP  Patient Care Team: Avis Tirone X, NP as PCP - General (Internal Medicine) Santo Stanly LABOR, MD as PCP - Cardiology (Cardiology) Lanell Starleen DASEN, MD as Referring Physician (Neurology) Roosevelt, Erla Shaker, MD as Referring Physician (Dermatology)  Extended Emergency Contact Information Primary Emergency Contact: Kayes,James Address: [address]          [place], [place] [address] [place] Mobile Phone: [phone] Relation: Son Secondary Emergency Contact: Mcraney,Caroline Address: [address]          [place], [place] [address] [place] Home Phone: [phone] Work Phone: [phone] Mobile Phone: [phone] Relation: Daughter  Code Status:  DNR Goals of care: Advanced Directive information    02/29/2024    3:20 PM  Advanced Directives  Does Patient Have a Medical Advance Directive? Yes  Type of Estate agent of [place];Living will;Out of facility DNR (pink MOST or yellow form)  Does patient want to make changes to medical advance directive? No - Patient declined  Copy of Healthcare Power of Attorney in Chart? Yes - validated most recent copy scanned in chart (See row information)     Chief Complaint  Patient presents with   Cough    Cough x 5 days. Covid test completed 2 days ago and it was negative. Discuss lisinopril , patient not sure if she is taking or not    HPI:  Pt is a 83 y.o. female seen today for cough x 5 days, getting better, no phlegm production, denied sore throat, chest pain, palpitation, or SOB. No O2 desaturation, she is afebrile.      The patient has chronic knee pain, L>R, needs four wheel  Rollator rolling walker for ambulation. Currently the patient is working with therapy for gait, strengthening, and balance.              Ambulates with walker, risk of falling, knees get weak at times.  Syncope  ED eval 05/13/23, unremarkable labs, CXR             Cardiology 04/05/23 in concern of syncope episode in May/hospitalization, on Furosemide  20mg  prn, no cardiac syncope or orthostatic hypotension,  Echo: tricuspid regurgitation, no RV dysfunction. F/u one year.              CT abd 4cm hypodense lesion left lobe of the liver, lesion from the left kidney, recommended US  02/15/24 1. No cholelithiasis or sonographic evidence for acute cholecystitis. 2. Increased hepatic parenchymal echogenicity suggestive of steatosis.             Seizure, ED eval for unresponsive 10-15 minutes, bit her tongue, off Depakote , saw neurology 03/28/23: placed on Zongran. Hospital 01/27/23-01/31/23 for seizure,EEG w/o seizure like activities, no antiepileptics recommended by Neurology,  LOC, CT head no acute process, more concerned of syncope etiology             HTN, resumed Lisinopril .              Prediabetes Hgb A1c 6.3 03/05/24 Hx of hyperlipidemia, LDL 130 03/05/24,  off Atorvastatin  The patient stated she sleeps better, bathroom trip only 1x/night.  CKD Bun/creat 19/0.93 03/05/24 11/22/22 Mammogram: Further evaluation is suggested for a possible mass in the left breast, repeat Mammogram 02/16/23 showed duct ectasia, cyst.  Edema BLE, taking Furosemide  daily Elevated TSH 4.72 03/05/24   Past Medical History:  Diagnosis Date  Arthritis    Cancer [facility])    skin   Cataract 2010   Dr. Donn, Dr.. Leverette @ [facility]   Epilepsy [facility]) 08/11/2015   Hyperlipidemia 12/08/2015   Hypertension    Hypothyroid    Polyp of colon 08/11/2015   Past Surgical History:  Procedure Laterality Date   ABDOMINAL HYSTERECTOMY  1970   APPENDECTOMY  1958   BREAST SURGERY  1984   COLON SURGERY  09/12/2006   FEMUR SURGERY  2018   Broken   TONSILLECTOMY  09/12/1944    Allergies  Allergen Reactions   Barbiturates Other (See Comments)    A small amount overdosed the patient- affected my stability and balance (might have been given at the same  as another med?)   Cefdinir Other (See Comments)    A small amount overdosed the patient- affected my stability and balance (might have been given at the same as another med?)   Codeine Other (See Comments)    Moderate, per pharmacy   Phenobarbital  Other (See Comments)    A small amount overdosed the patient- affected my stability and balance (might have been given at the same as another med?)   Tegretol  [Carbamazepine ] Other (See Comments)    A small amount overdosed the patient- affected my stability and balance (might have been given at the same as another med?)   Tramadol Other (See Comments)    Moderate, per pharmacy   Zonisamide  Other (See Comments)    Negative reaction (Zonegran )    Allergies as of 03/28/2024       Reactions   Barbiturates Other (See Comments)   A small amount overdosed the patient- affected my stability and balance (might have been given at the same as another med?)   Cefdinir Other (See Comments)   A small amount overdosed the patient- affected my stability and balance (might have been given at the same as another med?)   Codeine Other (See Comments)   Moderate, per pharmacy   Phenobarbital  Other (See Comments)   A small amount overdosed the patient- affected my stability and balance (might have been given at the same as another med?)   Tegretol  [carbamazepine ] Other (See Comments)   A small amount overdosed the patient- affected my stability and balance (might have been given at the same as another med?)   Tramadol Other (See Comments)   Moderate, per pharmacy   Zonisamide  Other (See Comments)   Negative reaction (Zonegran )        Medication List        Accurate as of March 28, 2024  4:07 PM. If you have any questions, ask your nurse or doctor.          benzonatate  100 MG capsule Commonly known as: TESSALON  Take 1 capsule (100 mg total) by mouth 3 (three) times daily as needed for cough. Started by: Travion Ke  X Anaysha Andre   furosemide  20 MG tablet Commonly known as: LASIX  Take 1 tablet (20 mg total) by mouth daily.   ketoconazole 2 % shampoo Commonly known as: NIZORAL Apply 1 application  topically once a week. As needed   lisinopril  10 MG tablet Commonly known as: ZESTRIL  Take 1 tablet (10 mg total) by mouth daily.   One-A-Day Womens 50 Plus Tabs Take 1 tablet by mouth daily with breakfast.   PreserVision AREDS 2 Caps Take 2 capsules by mouth daily.   Systane 0.4-0.3 % Soln Generic drug: Polyethyl Glycol-Propyl Glycol Place 1-2 drops into both eyes 3 (three) times daily  as needed (for dryness).   Vitamin D -3 25 MCG (1000 UT) Caps TAKE 1 CAPSULE DAILY WITH BREAKFAST.   zonisamide  100 MG capsule Commonly known as: ZONEGRAN  Take 1 capsule (100 mg total) by mouth at bedtime.        Review of Systems  Constitutional:  Negative for appetite change, fatigue and fever.  HENT:  Positive for hearing loss. Negative for congestion and trouble swallowing.   Eyes:  Negative for visual disturbance.  Respiratory:  Positive for cough. Negative for chest tightness, shortness of breath and wheezing.   Cardiovascular:  Positive for leg swelling.  Gastrointestinal:  Negative for abdominal pain.  Genitourinary:  Positive for frequency. Negative for dysuria and urgency.  Musculoskeletal:  Positive for arthralgias and gait problem.  Skin:  Negative for color change.  Neurological:  Negative for seizures, weakness and headaches.  Psychiatric/Behavioral:  Negative for behavioral problems and sleep disturbance. The patient is not nervous/anxious.     Immunization History  Administered Date(s) Administered   Fluad Quad(high Dose 65+) 07/03/2020, 06/28/2021, 06/28/2022   Influenza, High Dose Seasonal PF 06/26/2019   Influenza-Unspecified 06/29/2015, 05/21/2016   Moderna Covid-19 Vaccine  Bivalent Booster 71yrs & up 07/14/2022, 05/27/2023   Moderna Sars-Covid-2 Vaccination 09/16/2019, 10/14/2019,  07/21/2020, 01/21/2021, 01/28/2022   PFIZER(Purple Top)SARS-COV-2 Vaccination 06/01/2021   Pneumococcal Conjugate-13 01/19/2014   Pneumococcal Polysaccharide-23 07/28/2009   Respiratory Syncytial Virus Vaccine,Recomb Aduvanted(Arexvy) 09/09/2022   Td 07/02/1991, 02/02/1996, 09/25/1996, 07/25/2006   Tdap 12/22/2015   Zoster Recombinant(Shingrix) 05/27/2017, 06/20/2017, 11/24/2017   Pertinent  Health Maintenance Due  Topic Date Due   INFLUENZA VACCINE  04/12/2024   DEXA SCAN  Completed      03/15/2023    3:36 PM 04/13/2023    1:04 PM 08/31/2023    1:30 PM 11/02/2023    3:33 PM 02/29/2024    3:19 PM  Fall Risk  Falls in the past year? 0 0 0 1 1  Was there an injury with Fall? 0 0 0 0 0  Fall Risk Category Calculator 0 0 0 2 1  Patient at Risk for Falls Due to No Fall Risks No Fall Risks  History of fall(s) No Fall Risks  Fall risk Follow up Falls evaluation completed Falls evaluation completed  Falls evaluation completed Falls evaluation completed   Functional Status Survey:    Vitals:   03/28/24 1012  BP: 134/70  Pulse: 94  Temp: 98 F (36.7 C)  TempSrc: Temporal  SpO2: 91%  Weight: 200 lb (90.7 kg)  Height: 5' 4 (1.626 m)   Body mass index is 34.33 kg/m. Physical Exam Vitals and nursing note reviewed.  Constitutional:      Appearance: Normal appearance. She is obese.  HENT:     Head: Normocephalic and atraumatic.     Nose: Nose normal.     Mouth/Throat:     Mouth: Mucous membranes are moist.  Eyes:     Extraocular Movements: Extraocular movements intact.     Conjunctiva/sclera: Conjunctivae normal.     Pupils: Pupils are equal, round, and reactive to light.  Cardiovascular:     Rate and Rhythm: Normal rate and regular rhythm.     Heart sounds: No murmur heard. Pulmonary:     Effort: Pulmonary effort is normal.     Breath sounds: No rales.  Abdominal:     General: Bowel sounds are normal.     Palpations: Abdomen is soft.     Tenderness: There is no abdominal  tenderness.  Musculoskeletal:  Cervical back: Normal range of motion and neck supple.     Right lower leg: Edema present.     Left lower leg: Edema present.     Comments: Trace edema BLE R>L  Skin:    General: Skin is warm and dry.  Neurological:     General: No focal deficit present.     Mental Status: She is alert and oriented to person, place, and time. Mental status is at baseline.     Motor: No weakness.     Coordination: Coordination normal.     Gait: Gait abnormal.  Psychiatric:        Mood and Affect: Mood normal.        Behavior: Behavior normal.        Thought Content: Thought content normal.        Judgment: Judgment normal.     Labs reviewed: Recent Labs    05/13/23 1025 03/05/24 0709  NA 137 139  K 3.4* 4.1  CL 101 103  CO2 24 26  GLUCOSE 178* 102*  BUN 23 19  CREATININE 1.01* 0.93  CALCIUM  8.9 9.2   Recent Labs    03/05/24 0709  AST 14  ALT 15  BILITOT 0.3  PROT 6.9   Recent Labs    05/13/23 1025 03/05/24 0709  WBC 11.2* 10.4  NEUTROABS 6.5 6,230  HGB 12.7 12.3  HCT 41.0 39.2  MCV 90.5 88.5  PLT 269 301   Lab Results  Component Value Date   TSH 4.72 (H) 03/05/2024   Lab Results  Component Value Date   HGBA1C 6.3 (H) 03/05/2024   Lab Results  Component Value Date   CHOL 201 (H) 03/05/2024   HDL 49 (L) 03/05/2024   LDLCALC 130 (H) 03/05/2024   TRIG 112 03/05/2024   CHOLHDL 4.1 03/05/2024    Significant Diagnostic Results in last 30 days:  No results found.  Assessment/Plan  Essential hypertension Blood pressure is controlled,  resumed Lisinopril .   Prediabetes Hgb A1c 6.3 03/05/24  Hyperlipidemia LDL 130 03/05/24,  off Atorvastatin   CKD (chronic kidney disease) stage 3, GFR 30-59 ml/min (HCC)  Bun/creat 19/0.93 03/05/24  Edema Trace, chronic, stable, taking Furosemide  daily  Subclinical hypothyroidism  TSH 4.72 03/05/24  Seizure disorder [facility]) ED eval for unresponsive 10-15 minutes, bit her tongue, off  Depakote , saw neurology 03/28/23: placed on Zongran. Hospital 01/27/23-01/31/23 for seizure,EEG w/o seizure like activities, no antiepileptics recommended by Neurology,  LOC, CT head no acute process, more concerned of syncope etiology               URI (upper respiratory infection) cough x 5 days, getting better, no phlegm production, denied sore throat, chest pain, palpitation, or SOB. No O2 desaturation, she is afebrile Will try Tessalon  100mg  bid x 5 days as needed.  Observe.    Family/ staff Communication: plan of care reviewed with the patient  Labs/tests ordered:  none

## 2024-03-28 NOTE — Assessment & Plan Note (Signed)
 Blood pressure is controlled,  resumed Lisinopril .

## 2024-03-28 NOTE — Assessment & Plan Note (Addendum)
 cough x 5 days, getting better, no phlegm production, denied sore throat, chest pain, palpitation, or SOB. No O2 desaturation, she is afebrile Will try Tessalon  100mg  bid x 5 days as needed.  Observe.

## 2024-03-28 NOTE — Assessment & Plan Note (Signed)
 TSH 4.72 03/05/24

## 2024-03-28 NOTE — Assessment & Plan Note (Signed)
 LDL 130 03/05/24,  off Atorvastatin 

## 2024-03-28 NOTE — Assessment & Plan Note (Signed)
 Trace, chronic, stable, taking Furosemide  daily

## 2024-03-28 NOTE — Assessment & Plan Note (Signed)
 Bun/creat 19/0.93 03/05/24

## 2024-03-28 NOTE — Assessment & Plan Note (Signed)
 Hgb A1c 6.3 03/05/24

## 2024-03-29 DIAGNOSIS — R41841 Cognitive communication deficit: Secondary | ICD-10-CM | POA: Diagnosis not present

## 2024-04-02 DIAGNOSIS — R41841 Cognitive communication deficit: Secondary | ICD-10-CM | POA: Diagnosis not present

## 2024-04-04 ENCOUNTER — Ambulatory Visit: Admitting: Nurse Practitioner

## 2024-04-04 ENCOUNTER — Encounter: Payer: Self-pay | Admitting: Nurse Practitioner

## 2024-04-04 VITALS — BP 118/74 | HR 94 | Temp 97.2°F | Resp 18 | Ht 64.0 in | Wt 201.4 lb

## 2024-04-04 DIAGNOSIS — J069 Acute upper respiratory infection, unspecified: Secondary | ICD-10-CM

## 2024-04-04 DIAGNOSIS — G40909 Epilepsy, unspecified, not intractable, without status epilepticus: Secondary | ICD-10-CM | POA: Diagnosis not present

## 2024-04-04 DIAGNOSIS — J209 Acute bronchitis, unspecified: Secondary | ICD-10-CM

## 2024-04-04 DIAGNOSIS — I10 Essential (primary) hypertension: Secondary | ICD-10-CM

## 2024-04-04 DIAGNOSIS — E43 Unspecified severe protein-calorie malnutrition: Secondary | ICD-10-CM | POA: Diagnosis not present

## 2024-04-04 DIAGNOSIS — M150 Primary generalized (osteo)arthritis: Secondary | ICD-10-CM

## 2024-04-04 MED ORDER — AZITHROMYCIN 250 MG PO TABS
ORAL_TABLET | ORAL | 0 refills | Status: AC
Start: 2024-04-04 — End: 2024-04-09

## 2024-04-04 MED ORDER — BENZONATATE 100 MG PO CAPS
100.0000 mg | ORAL_CAPSULE | Freq: Three times a day (TID) | ORAL | 0 refills | Status: DC | PRN
Start: 2024-04-04 — End: 2024-07-20

## 2024-04-04 MED ORDER — GUAIFENESIN-DM 100-10 MG/5ML PO SYRP: 5.0000 mL | ORAL_SOLUTION | ORAL | 1 refills | Status: DC | PRN

## 2024-04-04 NOTE — Assessment & Plan Note (Signed)
 Stable, on Zongran

## 2024-04-04 NOTE — Assessment & Plan Note (Addendum)
 Blood pressure is controlled, self stopped Lisinopril 

## 2024-04-04 NOTE — Patient Instructions (Signed)
 Fu 2 weeks

## 2024-04-04 NOTE — Assessment & Plan Note (Addendum)
 had Tessalon  5 days, persisted, cough up greenish phlegm, no chest pain, SOB, or O2 desaturation Will continue Tessalon , try Zpk, Robitussin, may CXR if no better.

## 2024-04-04 NOTE — Assessment & Plan Note (Signed)
 The patient has chronic knee pain, L>R, needs four wheel  Rollator rolling walker for ambulation. Currently the patient is working with therapy for gait, strengthening, and balance.              Ambulates with walker, risk of falling, knees get weak at times.

## 2024-04-04 NOTE — Progress Notes (Unsigned)
 Location:   [facility]   Place of Service:    Provider: Larwance Hark NP  Cassidy Tashiro X, NP  Patient Care Team: Tinleigh Whitmire X, NP as PCP - General (Internal Medicine) Santo Stanly LABOR, MD as PCP - Cardiology (Cardiology) Lanell Starleen DASEN, MD as Referring Physician (Neurology) Roosevelt, Erla Shaker, MD as Referring Physician (Dermatology)  Extended Emergency Contact Information Primary Emergency Contact: Baccari,James Address: [address]          [place], [place] [address] [place] Phone: [phone] Relation: Son Secondary Emergency Contact: Hagerty,Caroline Address: [address]          [place], [place] [address] [place] Home Phone: [phone] Work Phone: [phone] Mobile Phone: [phone] Relation: Daughter  Code Status:  DNR Goals of care: Advanced Directive information    02/29/2024    3:20 PM  Advanced Directives  Does Patient Have a Medical Advance Directive? Yes  Type of Estate agent of [place];Living will;Out of facility DNR (pink MOST or yellow form)  Does patient want to make changes to medical advance directive? No - Patient declined  Copy of Healthcare Power of Attorney in Chart? Yes - validated most recent copy scanned in chart (See row information)     Chief Complaint  Patient presents with   Medical Management of Chronic Issues    Follow up from 07/17 for cough has not been resolved yet     HPI:  Pt is a 83 y.o. female seen today for medical management of chronic diseases.    URI, had Tessalon  5 days, persisted   The patient has chronic knee pain, L>R, needs four wheel  Rollator rolling walker for ambulation. Currently the patient is working with therapy for gait, strengthening, and balance.              Ambulates with walker, risk of falling, knees get weak at times.  Syncope ED eval 05/13/23, unremarkable labs, CXR             Cardiology 04/05/23 in concern of syncope episode in  May/hospitalization, on Furosemide  20mg  prn, no cardiac syncope or orthostatic hypotension,  Echo: tricuspid regurgitation, no RV dysfunction. F/u one year.              CT abd 4cm hypodense lesion left lobe of the liver, lesion from the left kidney, recommended US  02/15/24 1. No cholelithiasis or sonographic evidence for acute cholecystitis. 2. Increased hepatic parenchymal echogenicity suggestive of steatosis.             Seizure, ED eval for unresponsive 10-15 minutes, bit her tongue, off Depakote , saw neurology 03/28/23: placed on Zongran. Hospital 01/27/23-01/31/23 for seizure,EEG w/o seizure like activities, no antiepileptics recommended by Neurology,  LOC, CT head no acute process, more concerned of syncope etiology  HTN, self stopped Lisinopril .              Prediabetes Hgb A1c 6.3 03/05/24 Hx of hyperlipidemia, LDL 130 03/05/24,  off Atorvastatin  The patient stated she sleeps better, bathroom trip only 1x/night.  CKD Bun/creat 19/0.93 03/05/24 11/22/22 Mammogram: Further evaluation is suggested for a possible mass in the left breast, repeat Mammogram 02/16/23 showed duct ectasia, cyst.  Edema BLE, self stopped F rosemide daily Elevated TSH 4.72 03/05/24    Past Medical History:  Diagnosis Date   Arthritis    Cancer [facility])    skin   Cataract 2010   Dr. Donn, DrRONITA Lesch @ [facility]   Epilepsy [facility])  08/11/2015   Hyperlipidemia 12/08/2015   Hypertension    Hypothyroid    Polyp of colon 08/11/2015   Past Surgical History:  Procedure Laterality Date   ABDOMINAL HYSTERECTOMY  1970   APPENDECTOMY  1958   BREAST SURGERY  1984   COLON SURGERY  09/12/2006   FEMUR SURGERY  2018   Broken   TONSILLECTOMY  09/12/1944    Allergies  Allergen Reactions   Barbiturates Other (See Comments)    A small amount overdosed the patient- affected my stability and balance (might have been given at the same as another med?)   Cefdinir Other (See Comments)    A small amount overdosed the patient- affected  my stability and balance (might have been given at the same as another med?)   Codeine Other (See Comments)    Moderate, per pharmacy   Phenobarbital  Other (See Comments)    A small amount overdosed the patient- affected my stability and balance (might have been given at the same as another med?)   Tegretol  [Carbamazepine ] Other (See Comments)    A small amount overdosed the patient- affected my stability and balance (might have been given at the same as another med?)   Tramadol Other (See Comments)    Moderate, per pharmacy   Zonisamide  Other (See Comments)    Negative reaction (Zonegran )    Allergies as of 04/04/2024       Reactions   Barbiturates Other (See Comments)   A small amount overdosed the patient- affected my stability and balance (might have been given at the same as another med?)   Cefdinir Other (See Comments)   A small amount overdosed the patient- affected my stability and balance (might have been given at the same as another med?)   Codeine Other (See Comments)   Moderate, per pharmacy   Phenobarbital  Other (See Comments)   A small amount overdosed the patient- affected my stability and balance (might have been given at the same as another med?)   Tegretol  [carbamazepine ] Other (See Comments)   A small amount overdosed the patient- affected my stability and balance (might have been given at the same as another med?)   Tramadol Other (See Comments)   Moderate, per pharmacy   Zonisamide  Other (See Comments)   Negative reaction (Zonegran )        Medication List        Accurate as of April 04, 2024 11:59 PM. If you have any questions, ask your nurse or doctor.          STOP taking these medications    furosemide  20 MG tablet Commonly known as: LASIX  Stopped by: Yancy Hascall X Emelina Hinch   lisinopril  10 MG tablet Commonly known as: ZESTRIL  Stopped by: Tiajah Oyster X Embrie Mikkelsen       TAKE these medications    azithromycin  250 MG  tablet Commonly known as: ZITHROMAX  Take 2 tablets on day 1, then 1 tablet daily on days 2 through 5 Started by: Margarita Bobrowski X Arleatha Philipps   benzonatate  100 MG capsule Commonly known as: TESSALON  Take 1 capsule (100 mg total) by mouth 3 (three) times daily as needed for cough.   guaiFENesin -dextromethorphan 100-10 MG/5ML syrup Commonly known as: ROBITUSSIN DM Take 5 mLs by mouth every 4 (four) hours as needed for cough. Started by: Ottilie Wigglesworth X Shalese Strahan   ketoconazole 2 % shampoo Commonly known as: NIZORAL Apply 1 application  topically once a week. As needed   One-A-Day Womens 50 Plus Tabs Take 1 tablet by mouth  daily with breakfast.   PreserVision AREDS 2 Caps Take 2 capsules by mouth daily.   Systane 0.4-0.3 % Soln Generic drug: Polyethyl Glycol-Propyl Glycol Place 1-2 drops into both eyes 3 (three) times daily as needed (for dryness).   Vitamin D -3 25 MCG (1000 UT) Caps TAKE 1 CAPSULE DAILY WITH BREAKFAST.   zonisamide  100 MG capsule Commonly known as: ZONEGRAN  Take 1 capsule (100 mg total) by mouth at bedtime.        Review of Systems  Constitutional:  Negative for appetite change, fatigue and fever.  HENT:  Positive for hearing loss. Negative for congestion and trouble swallowing.   Eyes:  Negative for visual disturbance.  Respiratory:  Positive for cough. Negative for chest tightness, shortness of breath and wheezing.   Cardiovascular:  Positive for leg swelling.  Gastrointestinal:  Negative for abdominal pain.  Genitourinary:  Positive for frequency. Negative for dysuria and urgency.  Musculoskeletal:  Positive for arthralgias and gait problem.  Skin:  Negative for color change.  Neurological:  Negative for seizures, weakness and headaches.  Psychiatric/Behavioral:  Negative for behavioral problems and sleep disturbance. The patient is not nervous/anxious.     Immunization History  Administered Date(s) Administered   Fluad Quad(high Dose 65+) 07/03/2020, 06/28/2021, 06/28/2022    Influenza, High Dose Seasonal PF 06/26/2019   Influenza-Unspecified 06/29/2015, 05/21/2016   Moderna Covid-19 Vaccine  Bivalent Booster 64yrs & up 07/14/2022, 05/27/2023   Moderna Sars-Covid-2 Vaccination 09/16/2019, 10/14/2019, 07/21/2020, 01/21/2021, 01/28/2022   PFIZER(Purple Top)SARS-COV-2 Vaccination 06/01/2021   Pneumococcal Conjugate-13 01/19/2014   Pneumococcal Polysaccharide-23 07/28/2009   Respiratory Syncytial Virus Vaccine,Recomb Aduvanted(Arexvy) 09/09/2022   Td 07/02/1991, 02/02/1996, 09/25/1996, 07/25/2006   Tdap 12/22/2015   Zoster Recombinant(Shingrix) 05/27/2017, 06/20/2017, 11/24/2017   Pertinent  Health Maintenance Due  Topic Date Due   INFLUENZA VACCINE  04/12/2024   DEXA SCAN  Completed      03/15/2023    3:36 PM 04/13/2023    1:04 PM 08/31/2023    1:30 PM 11/02/2023    3:33 PM 02/29/2024    3:19 PM  Fall Risk  Falls in the past year? 0 0 0 1 1  Was there an injury with Fall? 0 0 0 0 0  Fall Risk Category Calculator 0 0 0 2 1  Patient at Risk for Falls Due to No Fall Risks No Fall Risks  History of fall(s) No Fall Risks  Fall risk Follow up Falls evaluation completed Falls evaluation completed  Falls evaluation completed Falls evaluation completed   Functional Status Survey:    Vitals:   04/04/24 1447  BP: 118/74  Pulse: 94  Resp: 18  Temp: (!) 97.2 F (36.2 C)  SpO2: 91%  Weight: 201 lb 6.4 oz (91.4 kg)  Height: 5' 4 (1.626 m)   Body mass index is 34.57 kg/m. Physical Exam Vitals and nursing note reviewed.  Constitutional:      Appearance: Normal appearance. She is obese.  HENT:     Head: Normocephalic and atraumatic.     Nose: Nose normal.     Mouth/Throat:     Mouth: Mucous membranes are moist.  Eyes:     Extraocular Movements: Extraocular movements intact.     Conjunctiva/sclera: Conjunctivae normal.     Pupils: Pupils are equal, round, and reactive to light.  Cardiovascular:     Rate and Rhythm: Normal rate and regular rhythm.      Heart sounds: No murmur heard. Pulmonary:     Effort: Pulmonary effort is normal.  Breath sounds: Rales present.     Comments: Rales posterior lung base.  Abdominal:     General: Bowel sounds are normal.     Palpations: Abdomen is soft.     Tenderness: There is no abdominal tenderness.  Musculoskeletal:     Cervical back: Normal range of motion and neck supple.     Right lower leg: Edema present.     Left lower leg: Edema present.     Comments: Trace edema BLE R>L  Skin:    General: Skin is warm and dry.  Neurological:     General: No focal deficit present.     Mental Status: She is alert and oriented to person, place, and time. Mental status is at baseline.     Motor: No weakness.     Coordination: Coordination normal.     Gait: Gait abnormal.  Psychiatric:        Mood and Affect: Mood normal.        Behavior: Behavior normal.        Thought Content: Thought content normal.        Judgment: Judgment normal.     Labs reviewed: Recent Labs    05/13/23 1025 03/05/24 0709  NA 137 139  K 3.4* 4.1  CL 101 103  CO2 24 26  GLUCOSE 178* 102*  BUN 23 19  CREATININE 1.01* 0.93  CALCIUM  8.9 9.2   Recent Labs    03/05/24 0709  AST 14  ALT 15  BILITOT 0.3  PROT 6.9   Recent Labs    05/13/23 1025 03/05/24 0709  WBC 11.2* 10.4  NEUTROABS 6.5 6,230  HGB 12.7 12.3  HCT 41.0 39.2  MCV 90.5 88.5  PLT 269 301   Lab Results  Component Value Date   TSH 4.72 (H) 03/05/2024   Lab Results  Component Value Date   HGBA1C 6.3 (H) 03/05/2024   Lab Results  Component Value Date   CHOL 201 (H) 03/05/2024   HDL 49 (L) 03/05/2024   LDLCALC 130 (H) 03/05/2024   TRIG 112 03/05/2024   CHOLHDL 4.1 03/05/2024    Significant Diagnostic Results in last 30 days:  No results found.  Assessment/Plan  Acute bronchitis had Tessalon  5 days, persisted, cough up greenish phlegm, no chest pain, SOB, or O2 desaturation Will continue Tessalon , try Zpk, Robitussin, may CXR if no  better.   Osteoarthritis, multiple sites  The patient has chronic knee pain, L>R, needs four wheel  Rollator rolling walker for ambulation. Currently the patient is working with therapy for gait, strengthening, and balance.              Ambulates with walker, risk of falling, knees get weak at times.   Seizure disorder (HCC) Stable, on Zongran  HTN (hypertension) Blood pressure is controlled, self stopped Lisinopril   Edema Mild BLE, self stopped Furosemide  daily   Family/ staff Communication: plan of care reviewed with the patient  Labs/tests ordered:  none

## 2024-04-04 NOTE — Assessment & Plan Note (Addendum)
 Mild BLE, self stopped Furosemide  daily

## 2024-04-05 ENCOUNTER — Encounter: Payer: Self-pay | Admitting: Nurse Practitioner

## 2024-04-09 DIAGNOSIS — R41841 Cognitive communication deficit: Secondary | ICD-10-CM | POA: Diagnosis not present

## 2024-04-12 DIAGNOSIS — R41841 Cognitive communication deficit: Secondary | ICD-10-CM | POA: Diagnosis not present

## 2024-04-16 DIAGNOSIS — R41841 Cognitive communication deficit: Secondary | ICD-10-CM | POA: Diagnosis not present

## 2024-04-18 ENCOUNTER — Encounter: Payer: Self-pay | Admitting: Nurse Practitioner

## 2024-04-18 ENCOUNTER — Ambulatory Visit: Admitting: Nurse Practitioner

## 2024-04-18 VITALS — BP 120/76 | HR 100 | Temp 97.3°F | Ht 64.0 in

## 2024-04-18 DIAGNOSIS — R7303 Prediabetes: Secondary | ICD-10-CM | POA: Diagnosis not present

## 2024-04-18 DIAGNOSIS — M150 Primary generalized (osteo)arthritis: Secondary | ICD-10-CM

## 2024-04-18 DIAGNOSIS — I10 Essential (primary) hypertension: Secondary | ICD-10-CM | POA: Diagnosis not present

## 2024-04-18 DIAGNOSIS — G40909 Epilepsy, unspecified, not intractable, without status epilepticus: Secondary | ICD-10-CM | POA: Diagnosis not present

## 2024-04-18 DIAGNOSIS — E785 Hyperlipidemia, unspecified: Secondary | ICD-10-CM

## 2024-04-18 NOTE — Assessment & Plan Note (Signed)
 URI, had Tessalon  5 days, persisted              The patient has chronic knee pain, L>R, needs four wheel  Rollator rolling walker for ambulation. Currently the patient is working with therapy for gait, strengthening, and balance.              Ambulates with walker, risk of falling, knees get weak at times.  Syncope ED eval 05/13/23, unremarkable labs, CXR             Cardiology 04/05/23 in concern of syncope episode in May/hospitalization, on Furosemide  20mg  prn, no cardiac syncope or orthostatic hypotension,  Echo: tricuspid regurgitation, no RV dysfunction. F/u one year.              CT abd 4cm hypodense lesion left lobe of the liver, lesion from the left kidney, recommended US  02/15/24 1. No cholelithiasis or sonographic evidence for acute cholecystitis. 2. Increased hepatic parenchymal echogenicity suggestive of steatosis.             Seizure, ED eval for unresponsive 10-15 minutes, bit her tongue, off Depakote , saw neurology 03/28/23: placed on Zongran. Hospital 01/27/23-01/31/23 for seizure,EEG w/o seizure like activities, no antiepileptics recommended by Neurology,  LOC, CT head no acute process, more concerned of syncope etiology             HTN, self stopped Lisinopril .              Prediabetes Hgb A1c 6.3 03/05/24 Hx of hyperlipidemia, LDL 130 03/05/24,  off Atorvastatin  The patient stated she sleeps better, bathroom trip only 1x/night.  CKD Bun/creat 19/0.93 03/05/24 11/22/22 Mammogram: Further evaluation is suggested for a possible mass in the left breast, repeat Mammogram 02/16/23 showed duct ectasia, cyst.  Edema BLE, self stopped F rosemide daily Elevated TSH 4.72 03/05/24

## 2024-04-18 NOTE — Assessment & Plan Note (Signed)
 LDL 130 03/05/24,  off Atorvastatin 

## 2024-04-18 NOTE — Progress Notes (Unsigned)
 Location:   [facility]   Place of Service:    Provider: Larwance Hark NP  Viha Kriegel X, NP  Patient Care Team: Nicola Quesnell X, NP as PCP - General (Internal Medicine) Santo Stanly LABOR, MD as PCP - Cardiology (Cardiology) Lanell Starleen DASEN, MD as Referring Physician (Neurology) Roosevelt, Erla Shaker, MD as Referring Physician (Dermatology)  Extended Emergency Contact Information Primary Emergency Contact: Rochelle,James Address: [address]          [place], [place] [address] [place] Mobile Phone: [phone] Relation: Son Secondary Emergency Contact: Guardiola,Caroline Address: [address]          [place], [place] [address] [place] Home Phone: [phone] Work Phone: [phone] Mobile Phone: [phone] Relation: Daughter  Code Status:  DNR Goals of care: Advanced Directive information    02/29/2024    3:20 PM  Advanced Directives  Does Patient Have a Medical Advance Directive? Yes  Type of Estate agent of [place];Living will;Out of facility DNR (pink MOST or yellow form)  Does patient want to make changes to medical advance directive? No - Patient declined  Copy of Healthcare Power of Attorney in Chart? Yes - validated most recent copy scanned in chart (See row information)     Chief Complaint  Patient presents with   Medical Management of Chronic Issues    Follow up     HPI:  Pt is a 83 y.o. female seen today for medical management of chronic diseases.      URI, fully treated, resolved.               The patient has chronic knee pain, L>R, needs four wheel  Rollator rolling walker for ambulation. Currently the patient is working with therapy for gait, strengthening, and balance.              Ambulates with walker, risk of falling, knees get weak at times.  Syncope ED eval 05/13/23, unremarkable labs, CXR             Cardiology 04/05/23 in concern of syncope episode in May/hospitalization, on Furosemide  20mg   prn, no cardiac syncope or orthostatic hypotension,  Echo: tricuspid regurgitation, no RV dysfunction. F/u one year.              CT abd 4cm hypodense lesion left lobe of the liver, lesion from the left kidney, recommended US  02/15/24 1. No cholelithiasis or sonographic evidence for acute cholecystitis. 2. Increased hepatic parenchymal echogenicity suggestive of steatosis.             Seizure, ED eval for unresponsive 10-15 minutes, bit her tongue, off Depakote , saw neurology 03/28/23: placed on Zongran. Hospital 01/27/23-01/31/23 for seizure,EEG w/o seizure like activities, no antiepileptics recommended by Neurology,  LOC, CT head no acute process, more concerned of syncope etiology             HTN, self stopped Lisinopril .              Prediabetes Hgb A1c 6.3 03/05/24 Hx of hyperlipidemia, LDL 130 03/05/24,  off Atorvastatin  The patient stated she sleeps better, bathroom trip only 1x/night.  CKD Bun/creat 19/0.93 03/05/24 11/22/22 Mammogram: Further evaluation is suggested for a possible mass in the left breast, repeat Mammogram 02/16/23 showed duct ectasia, cyst.  Edema BLE, self stopped F rosemide daily Elevated TSH 4.72 03/05/24     Past Medical History:  Diagnosis Date   Arthritis    Cancer (HCC)    skin  Cataract 2010   Dr. Donn, Dr.. Leverette @ [facility]   Epilepsy [facility]) 08/11/2015   Hyperlipidemia 12/08/2015   Hypertension    Hypothyroid    Polyp of colon 08/11/2015   Past Surgical History:  Procedure Laterality Date   ABDOMINAL HYSTERECTOMY  1970   APPENDECTOMY  1958   BREAST SURGERY  1984   COLON SURGERY  09/12/2006   FEMUR SURGERY  2018   Broken   TONSILLECTOMY  09/12/1944    Allergies  Allergen Reactions   Barbiturates Other (See Comments)    A small amount overdosed the patient- affected my stability and balance (might have been given at the same as another med?)   Cefdinir Other (See Comments)    A small amount overdosed the patient- affected my stability and balance  (might have been given at the same as another med?)   Codeine Other (See Comments)    Moderate, per pharmacy   Phenobarbital  Other (See Comments)    A small amount overdosed the patient- affected my stability and balance (might have been given at the same as another med?)   Tegretol  [Carbamazepine ] Other (See Comments)    A small amount overdosed the patient- affected my stability and balance (might have been given at the same as another med?)   Tramadol Other (See Comments)    Moderate, per pharmacy   Zonisamide  Other (See Comments)    Negative reaction (Zonegran )    Allergies as of 04/18/2024       Reactions   Barbiturates Other (See Comments)   A small amount overdosed the patient- affected my stability and balance (might have been given at the same as another med?)   Cefdinir Other (See Comments)   A small amount overdosed the patient- affected my stability and balance (might have been given at the same as another med?)   Codeine Other (See Comments)   Moderate, per pharmacy   Phenobarbital  Other (See Comments)   A small amount overdosed the patient- affected my stability and balance (might have been given at the same as another med?)   Tegretol  [carbamazepine ] Other (See Comments)   A small amount overdosed the patient- affected my stability and balance (might have been given at the same as another med?)   Tramadol Other (See Comments)   Moderate, per pharmacy   Zonisamide  Other (See Comments)   Negative reaction (Zonegran )        Medication List        Accurate as of April 18, 2024 11:59 PM. If you have any questions, ask your nurse or doctor.          benzonatate  100 MG capsule Commonly known as: TESSALON  Take 1 capsule (100 mg total) by mouth 3 (three) times daily as needed for cough.   guaiFENesin -dextromethorphan 100-10 MG/5ML syrup Commonly known as: ROBITUSSIN DM Take 5 mLs by mouth every 4 (four) hours as needed for  cough.   ketoconazole 2 % shampoo Commonly known as: NIZORAL Apply 1 application  topically once a week. As needed   One-A-Day Womens 50 Plus Tabs Take 1 tablet by mouth daily with breakfast.   PreserVision AREDS 2 Caps Take 2 capsules by mouth daily.   Systane 0.4-0.3 % Soln Generic drug: Polyethyl Glycol-Propyl Glycol Place 1-2 drops into both eyes 3 (three) times daily as needed (for dryness).   Vitamin D -3 25 MCG (1000 UT) Caps TAKE 1 CAPSULE DAILY WITH BREAKFAST.   zonisamide  100 MG capsule Commonly known as: ZONEGRAN  Take 1 capsule (  100 mg total) by mouth at bedtime.        Review of Systems  Constitutional:  Negative for appetite change, fatigue and fever.  HENT:  Positive for hearing loss. Negative for congestion and trouble swallowing.   Eyes:  Negative for visual disturbance.  Respiratory:  Positive for cough. Negative for chest tightness, shortness of breath and wheezing.   Cardiovascular:  Positive for leg swelling.  Gastrointestinal:  Negative for abdominal pain.  Genitourinary:  Positive for frequency. Negative for dysuria and urgency.  Musculoskeletal:  Positive for arthralgias and gait problem.  Skin:  Negative for color change.  Neurological:  Negative for seizures, weakness and headaches.  Psychiatric/Behavioral:  Negative for behavioral problems and sleep disturbance. The patient is not nervous/anxious.     Immunization History  Administered Date(s) Administered   Fluad Quad(high Dose 65+) 07/03/2020, 06/28/2021, 06/28/2022   Influenza, High Dose Seasonal PF 06/26/2019   Influenza-Unspecified 06/29/2015, 05/21/2016   Moderna Covid-19 Vaccine  Bivalent Booster 46yrs & up 07/14/2022, 05/27/2023   Moderna Sars-Covid-2 Vaccination 09/16/2019, 10/14/2019, 07/21/2020, 01/21/2021, 01/28/2022   PFIZER(Purple Top)SARS-COV-2 Vaccination 06/01/2021   Pneumococcal Conjugate-13 01/19/2014   Pneumococcal Polysaccharide-23 07/28/2009   Respiratory Syncytial Virus  Vaccine,Recomb Aduvanted(Arexvy) 09/09/2022   Td 07/02/1991, 02/02/1996, 09/25/1996, 07/25/2006   Tdap 12/22/2015   Zoster Recombinant(Shingrix) 05/27/2017, 06/20/2017, 11/24/2017   Pertinent  Health Maintenance Due  Topic Date Due   INFLUENZA VACCINE  04/12/2024   DEXA SCAN  Completed      03/15/2023    3:36 PM 04/13/2023    1:04 PM 08/31/2023    1:30 PM 11/02/2023    3:33 PM 02/29/2024    3:19 PM  Fall Risk  Falls in the past year? 0 0 0 1 1  Was there an injury with Fall? 0 0 0 0 0  Fall Risk Category Calculator 0 0 0 2 1  Patient at Risk for Falls Due to No Fall Risks No Fall Risks  History of fall(s) No Fall Risks  Fall risk Follow up Falls evaluation completed Falls evaluation completed  Falls evaluation completed Falls evaluation completed   Functional Status Survey:    Vitals:   04/18/24 1415  BP: 120/76  Pulse: 100  Temp: (!) 97.3 F (36.3 C)  SpO2: 95%  Height: 5' 4 (1.626 m)   Body mass index is 34.57 kg/m. Physical Exam Vitals and nursing note reviewed.  Constitutional:      Appearance: Normal appearance. She is obese.  HENT:     Head: Normocephalic and atraumatic.     Nose: Nose normal.     Mouth/Throat:     Mouth: Mucous membranes are moist.  Eyes:     Extraocular Movements: Extraocular movements intact.     Conjunctiva/sclera: Conjunctivae normal.     Pupils: Pupils are equal, round, and reactive to light.  Cardiovascular:     Rate and Rhythm: Normal rate and regular rhythm.     Heart sounds: No murmur heard. Pulmonary:     Effort: Pulmonary effort is normal.     Breath sounds: Rales present.     Comments: Rales posterior lung base.  Abdominal:     General: Bowel sounds are normal.     Palpations: Abdomen is soft.     Tenderness: There is no abdominal tenderness.  Musculoskeletal:     Cervical back: Normal range of motion and neck supple.     Right lower leg: Edema present.     Left lower leg: Edema present.  Comments: Trace edema BLE R>L   Skin:    General: Skin is warm and dry.  Neurological:     General: No focal deficit present.     Mental Status: She is alert and oriented to person, place, and time. Mental status is at baseline.     Motor: No weakness.     Coordination: Coordination normal.     Gait: Gait abnormal.  Psychiatric:        Mood and Affect: Mood normal.        Behavior: Behavior normal.        Thought Content: Thought content normal.        Judgment: Judgment normal.     Labs reviewed: Recent Labs    05/13/23 1025 03/05/24 0709  NA 137 139  K 3.4* 4.1  CL 101 103  CO2 24 26  GLUCOSE 178* 102*  BUN 23 19  CREATININE 1.01* 0.93  CALCIUM  8.9 9.2   Recent Labs    03/05/24 0709  AST 14  ALT 15  BILITOT 0.3  PROT 6.9   Recent Labs    05/13/23 1025 03/05/24 0709  WBC 11.2* 10.4  NEUTROABS 6.5 6,230  HGB 12.7 12.3  HCT 41.0 39.2  MCV 90.5 88.5  PLT 269 301   Lab Results  Component Value Date   TSH 4.72 (H) 03/05/2024   Lab Results  Component Value Date   HGBA1C 6.3 (H) 03/05/2024   Lab Results  Component Value Date   CHOL 201 (H) 03/05/2024   HDL 49 (L) 03/05/2024   LDLCALC 130 (H) 03/05/2024   TRIG 112 03/05/2024   CHOLHDL 4.1 03/05/2024    Significant Diagnostic Results in last 30 days:  No results found.  Assessment/Plan  Osteoarthritis, multiple sites Ambulates with walker slowly  Seizure disorder (HCC) No active seizures, continue Zongran  HTN (hypertension)  URI, had Tessalon  5 days, persisted              The patient has chronic knee pain, L>R, needs four wheel  Rollator rolling walker for ambulation. Currently the patient is working with therapy for gait, strengthening, and balance.              Ambulates with walker, risk of falling, knees get weak at times.  Syncope ED eval 05/13/23, unremarkable labs, CXR             Cardiology 04/05/23 in concern of syncope episode in May/hospitalization, on Furosemide  20mg  prn, no cardiac syncope or orthostatic  hypotension,  Echo: tricuspid regurgitation, no RV dysfunction. F/u one year.              CT abd 4cm hypodense lesion left lobe of the liver, lesion from the left kidney, recommended US  02/15/24 1. No cholelithiasis or sonographic evidence for acute cholecystitis. 2. Increased hepatic parenchymal echogenicity suggestive of steatosis.             Seizure, ED eval for unresponsive 10-15 minutes, bit her tongue, off Depakote , saw neurology 03/28/23: placed on Zongran. Hospital 01/27/23-01/31/23 for seizure,EEG w/o seizure like activities, no antiepileptics recommended by Neurology,  LOC, CT head no acute process, more concerned of syncope etiology             HTN, self stopped Lisinopril .              Prediabetes Hgb A1c 6.3 03/05/24 Hx of hyperlipidemia, LDL 130 03/05/24,  off Atorvastatin  The patient stated she sleeps better, bathroom trip only 1x/night.  CKD Bun/creat  19/0.93 03/05/24 11/22/22 Mammogram: Further evaluation is suggested for a possible mass in the left breast, repeat Mammogram 02/16/23 showed duct ectasia, cyst.  Edema BLE, self stopped F rosemide daily Elevated TSH 4.72 03/05/24      Prediabetes Diet controlled, Hgb A1c 6.3 03/05/24  Hyperlipidemia LDL 130 03/05/24,  off Atorvastatin    Family/ staff Communication: plan of care reviewed with the patient  Labs/tests ordered:  none

## 2024-04-18 NOTE — Assessment & Plan Note (Signed)
Ambulates with walker slowly.

## 2024-04-18 NOTE — Assessment & Plan Note (Signed)
 No active seizures, continue Zongran

## 2024-04-18 NOTE — Assessment & Plan Note (Signed)
 Diet controlled, Hgb A1c 6.3 03/05/24

## 2024-04-19 ENCOUNTER — Encounter: Payer: Self-pay | Admitting: Nurse Practitioner

## 2024-04-19 DIAGNOSIS — R41841 Cognitive communication deficit: Secondary | ICD-10-CM | POA: Diagnosis not present

## 2024-04-23 DIAGNOSIS — R41841 Cognitive communication deficit: Secondary | ICD-10-CM | POA: Diagnosis not present

## 2024-04-25 DIAGNOSIS — R41841 Cognitive communication deficit: Secondary | ICD-10-CM | POA: Diagnosis not present

## 2024-04-30 DIAGNOSIS — R41841 Cognitive communication deficit: Secondary | ICD-10-CM | POA: Diagnosis not present

## 2024-05-03 DIAGNOSIS — R41841 Cognitive communication deficit: Secondary | ICD-10-CM | POA: Diagnosis not present

## 2024-05-07 DIAGNOSIS — R41841 Cognitive communication deficit: Secondary | ICD-10-CM | POA: Diagnosis not present

## 2024-05-10 DIAGNOSIS — R41841 Cognitive communication deficit: Secondary | ICD-10-CM | POA: Diagnosis not present

## 2024-05-14 DIAGNOSIS — R41841 Cognitive communication deficit: Secondary | ICD-10-CM | POA: Diagnosis not present

## 2024-05-31 DIAGNOSIS — R41841 Cognitive communication deficit: Secondary | ICD-10-CM | POA: Diagnosis not present

## 2024-06-04 DIAGNOSIS — R41841 Cognitive communication deficit: Secondary | ICD-10-CM | POA: Diagnosis not present

## 2024-06-07 DIAGNOSIS — R41841 Cognitive communication deficit: Secondary | ICD-10-CM | POA: Diagnosis not present

## 2024-06-13 DIAGNOSIS — Z23 Encounter for immunization: Secondary | ICD-10-CM | POA: Diagnosis not present

## 2024-06-14 DIAGNOSIS — R278 Other lack of coordination: Secondary | ICD-10-CM | POA: Diagnosis not present

## 2024-06-14 DIAGNOSIS — R2681 Unsteadiness on feet: Secondary | ICD-10-CM | POA: Diagnosis not present

## 2024-06-14 DIAGNOSIS — R41841 Cognitive communication deficit: Secondary | ICD-10-CM | POA: Diagnosis not present

## 2024-06-14 DIAGNOSIS — R296 Repeated falls: Secondary | ICD-10-CM | POA: Diagnosis not present

## 2024-06-21 DIAGNOSIS — R2681 Unsteadiness on feet: Secondary | ICD-10-CM | POA: Diagnosis not present

## 2024-06-21 DIAGNOSIS — R41841 Cognitive communication deficit: Secondary | ICD-10-CM | POA: Diagnosis not present

## 2024-06-21 DIAGNOSIS — R278 Other lack of coordination: Secondary | ICD-10-CM | POA: Diagnosis not present

## 2024-06-21 DIAGNOSIS — R296 Repeated falls: Secondary | ICD-10-CM | POA: Diagnosis not present

## 2024-06-25 DIAGNOSIS — L570 Actinic keratosis: Secondary | ICD-10-CM | POA: Diagnosis not present

## 2024-06-25 DIAGNOSIS — L905 Scar conditions and fibrosis of skin: Secondary | ICD-10-CM | POA: Diagnosis not present

## 2024-06-25 DIAGNOSIS — L219 Seborrheic dermatitis, unspecified: Secondary | ICD-10-CM | POA: Diagnosis not present

## 2024-06-25 DIAGNOSIS — L814 Other melanin hyperpigmentation: Secondary | ICD-10-CM | POA: Diagnosis not present

## 2024-06-25 DIAGNOSIS — L853 Xerosis cutis: Secondary | ICD-10-CM | POA: Diagnosis not present

## 2024-06-25 DIAGNOSIS — L821 Other seborrheic keratosis: Secondary | ICD-10-CM | POA: Diagnosis not present

## 2024-06-27 ENCOUNTER — Encounter: Admitting: Nurse Practitioner

## 2024-06-27 ENCOUNTER — Encounter: Payer: Self-pay | Admitting: Nurse Practitioner

## 2024-06-27 VITALS — Ht 64.0 in

## 2024-06-27 DIAGNOSIS — M150 Primary generalized (osteo)arthritis: Secondary | ICD-10-CM

## 2024-06-27 DIAGNOSIS — E43 Unspecified severe protein-calorie malnutrition: Secondary | ICD-10-CM

## 2024-06-27 DIAGNOSIS — G40909 Epilepsy, unspecified, not intractable, without status epilepticus: Secondary | ICD-10-CM

## 2024-06-27 DIAGNOSIS — I10 Essential (primary) hypertension: Secondary | ICD-10-CM

## 2024-06-27 DIAGNOSIS — Z23 Encounter for immunization: Secondary | ICD-10-CM | POA: Diagnosis not present

## 2024-06-27 NOTE — Assessment & Plan Note (Signed)
 Stable, continue Zongran

## 2024-06-27 NOTE — Assessment & Plan Note (Signed)
 self stopped Furosemide  daily

## 2024-06-27 NOTE — Progress Notes (Signed)
 This encounter was created in error - please disregard.

## 2024-06-27 NOTE — Assessment & Plan Note (Signed)
 The patient has chronic knee pain, L>R, needs four wheel  Rollator rolling walker for ambulation.

## 2024-06-27 NOTE — Assessment & Plan Note (Signed)
 Blood pressure is controlled, off Lisinopril 

## 2024-06-28 DIAGNOSIS — R278 Other lack of coordination: Secondary | ICD-10-CM | POA: Diagnosis not present

## 2024-06-28 DIAGNOSIS — R296 Repeated falls: Secondary | ICD-10-CM | POA: Diagnosis not present

## 2024-06-28 DIAGNOSIS — R2681 Unsteadiness on feet: Secondary | ICD-10-CM | POA: Diagnosis not present

## 2024-06-28 DIAGNOSIS — R41841 Cognitive communication deficit: Secondary | ICD-10-CM | POA: Diagnosis not present

## 2024-07-02 DIAGNOSIS — R278 Other lack of coordination: Secondary | ICD-10-CM | POA: Diagnosis not present

## 2024-07-02 DIAGNOSIS — R296 Repeated falls: Secondary | ICD-10-CM | POA: Diagnosis not present

## 2024-07-02 DIAGNOSIS — R2681 Unsteadiness on feet: Secondary | ICD-10-CM | POA: Diagnosis not present

## 2024-07-02 DIAGNOSIS — R41841 Cognitive communication deficit: Secondary | ICD-10-CM | POA: Diagnosis not present

## 2024-07-04 DIAGNOSIS — R278 Other lack of coordination: Secondary | ICD-10-CM | POA: Diagnosis not present

## 2024-07-04 DIAGNOSIS — R41841 Cognitive communication deficit: Secondary | ICD-10-CM | POA: Diagnosis not present

## 2024-07-04 DIAGNOSIS — R296 Repeated falls: Secondary | ICD-10-CM | POA: Diagnosis not present

## 2024-07-04 DIAGNOSIS — R2681 Unsteadiness on feet: Secondary | ICD-10-CM | POA: Diagnosis not present

## 2024-07-05 DIAGNOSIS — R2681 Unsteadiness on feet: Secondary | ICD-10-CM | POA: Diagnosis not present

## 2024-07-05 DIAGNOSIS — R296 Repeated falls: Secondary | ICD-10-CM | POA: Diagnosis not present

## 2024-07-05 DIAGNOSIS — R41841 Cognitive communication deficit: Secondary | ICD-10-CM | POA: Diagnosis not present

## 2024-07-05 DIAGNOSIS — R278 Other lack of coordination: Secondary | ICD-10-CM | POA: Diagnosis not present

## 2024-07-09 DIAGNOSIS — R2681 Unsteadiness on feet: Secondary | ICD-10-CM | POA: Diagnosis not present

## 2024-07-09 DIAGNOSIS — R278 Other lack of coordination: Secondary | ICD-10-CM | POA: Diagnosis not present

## 2024-07-09 DIAGNOSIS — R41841 Cognitive communication deficit: Secondary | ICD-10-CM | POA: Diagnosis not present

## 2024-07-09 DIAGNOSIS — R296 Repeated falls: Secondary | ICD-10-CM | POA: Diagnosis not present

## 2024-07-12 DIAGNOSIS — R278 Other lack of coordination: Secondary | ICD-10-CM | POA: Diagnosis not present

## 2024-07-12 DIAGNOSIS — R2681 Unsteadiness on feet: Secondary | ICD-10-CM | POA: Diagnosis not present

## 2024-07-12 DIAGNOSIS — R41841 Cognitive communication deficit: Secondary | ICD-10-CM | POA: Diagnosis not present

## 2024-07-12 DIAGNOSIS — R296 Repeated falls: Secondary | ICD-10-CM | POA: Diagnosis not present

## 2024-07-18 ENCOUNTER — Inpatient Hospital Stay (HOSPITAL_COMMUNITY)
Admission: EM | Admit: 2024-07-18 | Discharge: 2024-07-20 | DRG: 101 | Disposition: A | Discharge status: Home / Self Care | Attending: Internal Medicine | Admitting: Internal Medicine

## 2024-07-18 ENCOUNTER — Emergency Department (HOSPITAL_COMMUNITY)

## 2024-07-18 DIAGNOSIS — R404 Transient alteration of awareness: Secondary | ICD-10-CM | POA: Diagnosis present

## 2024-07-18 DIAGNOSIS — N390 Urinary tract infection, site not specified: Secondary | ICD-10-CM | POA: Diagnosis present

## 2024-07-18 DIAGNOSIS — Z885 Allergy status to narcotic agent status: Secondary | ICD-10-CM | POA: Diagnosis not present

## 2024-07-18 DIAGNOSIS — Z820 Family history of epilepsy and other diseases of the nervous system: Secondary | ICD-10-CM | POA: Diagnosis not present

## 2024-07-18 DIAGNOSIS — Z8249 Family history of ischemic heart disease and other diseases of the circulatory system: Secondary | ICD-10-CM | POA: Diagnosis not present

## 2024-07-18 DIAGNOSIS — N3000 Acute cystitis without hematuria: Secondary | ICD-10-CM | POA: Diagnosis present

## 2024-07-18 DIAGNOSIS — Z6834 Body mass index (BMI) 34.0-34.9, adult: Secondary | ICD-10-CM

## 2024-07-18 DIAGNOSIS — N1831 Chronic kidney disease, stage 3a: Secondary | ICD-10-CM | POA: Diagnosis present

## 2024-07-18 DIAGNOSIS — E785 Hyperlipidemia, unspecified: Secondary | ICD-10-CM | POA: Diagnosis present

## 2024-07-18 DIAGNOSIS — Z8261 Family history of arthritis: Secondary | ICD-10-CM | POA: Diagnosis not present

## 2024-07-18 DIAGNOSIS — Z8262 Family history of osteoporosis: Secondary | ICD-10-CM | POA: Diagnosis not present

## 2024-07-18 DIAGNOSIS — Z803 Family history of malignant neoplasm of breast: Secondary | ICD-10-CM

## 2024-07-18 DIAGNOSIS — M4802 Spinal stenosis, cervical region: Secondary | ICD-10-CM | POA: Diagnosis not present

## 2024-07-18 DIAGNOSIS — M4312 Spondylolisthesis, cervical region: Secondary | ICD-10-CM | POA: Diagnosis not present

## 2024-07-18 DIAGNOSIS — Z833 Family history of diabetes mellitus: Secondary | ICD-10-CM

## 2024-07-18 DIAGNOSIS — I129 Hypertensive chronic kidney disease with stage 1 through stage 4 chronic kidney disease, or unspecified chronic kidney disease: Secondary | ICD-10-CM | POA: Diagnosis present

## 2024-07-18 DIAGNOSIS — G40909 Epilepsy, unspecified, not intractable, without status epilepticus: Principal | ICD-10-CM | POA: Diagnosis present

## 2024-07-18 DIAGNOSIS — R569 Unspecified convulsions: Principal | ICD-10-CM

## 2024-07-18 DIAGNOSIS — I6782 Cerebral ischemia: Secondary | ICD-10-CM | POA: Diagnosis not present

## 2024-07-18 DIAGNOSIS — E66811 Obesity, class 1: Secondary | ICD-10-CM | POA: Diagnosis present

## 2024-07-18 DIAGNOSIS — Z1152 Encounter for screening for COVID-19: Secondary | ICD-10-CM

## 2024-07-18 DIAGNOSIS — E039 Hypothyroidism, unspecified: Secondary | ICD-10-CM | POA: Diagnosis present

## 2024-07-18 DIAGNOSIS — Z860100 Personal history of colon polyps, unspecified: Secondary | ICD-10-CM

## 2024-07-18 DIAGNOSIS — Z90710 Acquired absence of both cervix and uterus: Secondary | ICD-10-CM | POA: Diagnosis not present

## 2024-07-18 DIAGNOSIS — Z888 Allergy status to other drugs, medicaments and biological substances status: Secondary | ICD-10-CM | POA: Diagnosis not present

## 2024-07-18 DIAGNOSIS — Z823 Family history of stroke: Secondary | ICD-10-CM

## 2024-07-18 DIAGNOSIS — M47812 Spondylosis without myelopathy or radiculopathy, cervical region: Secondary | ICD-10-CM | POA: Diagnosis not present

## 2024-07-18 LAB — CBC WITH DIFFERENTIAL/PLATELET
Abs Immature Granulocytes: 0.07 K/uL (ref 0.00–0.07)
Basophils Absolute: 0.1 K/uL (ref 0.0–0.1)
Basophils Relative: 0 %
Eosinophils Absolute: 0.1 K/uL (ref 0.0–0.5)
Eosinophils Relative: 1 %
HCT: 43.4 % (ref 36.0–46.0)
Hemoglobin: 13.5 g/dL (ref 12.0–15.0)
Immature Granulocytes: 1 %
Lymphocytes Relative: 12 %
Lymphs Abs: 1.5 K/uL (ref 0.7–4.0)
MCH: 28.3 pg (ref 26.0–34.0)
MCHC: 31.1 g/dL (ref 30.0–36.0)
MCV: 91 fL (ref 80.0–100.0)
Monocytes Absolute: 0.9 K/uL (ref 0.1–1.0)
Monocytes Relative: 7 %
Neutro Abs: 10.2 K/uL — ABNORMAL HIGH (ref 1.7–7.7)
Neutrophils Relative %: 79 %
Platelets: 242 K/uL (ref 150–400)
RBC: 4.77 MIL/uL (ref 3.87–5.11)
RDW: 14.9 % (ref 11.5–15.5)
WBC: 12.8 K/uL — ABNORMAL HIGH (ref 4.0–10.5)
nRBC: 0 % (ref 0.0–0.2)

## 2024-07-18 LAB — URINALYSIS, ROUTINE W REFLEX MICROSCOPIC
Bilirubin Urine: NEGATIVE
Glucose, UA: NEGATIVE mg/dL
Hgb urine dipstick: NEGATIVE
Ketones, ur: NEGATIVE mg/dL
Nitrite: NEGATIVE
Protein, ur: 30 mg/dL — AB
Specific Gravity, Urine: 1.016 (ref 1.005–1.030)
WBC, UA: >50 WBC/hpf (ref 0–5)
pH: 6 (ref 5.0–8.0)

## 2024-07-18 LAB — LIPASE, BLOOD: Lipase: 77 U/L — ABNORMAL HIGH (ref 11–51)

## 2024-07-18 LAB — COMPREHENSIVE METABOLIC PANEL WITH GFR
ALT: 25 U/L (ref 0–44)
AST: 36 U/L (ref 15–41)
Albumin: 3.8 g/dL (ref 3.5–5.0)
Alkaline Phosphatase: 125 U/L (ref 38–126)
Anion gap: 10 (ref 5–15)
BUN: 31 mg/dL — ABNORMAL HIGH (ref 8–23)
CO2: 26 mmol/L (ref 22–32)
Calcium: 9.8 mg/dL (ref 8.9–10.3)
Chloride: 105 mmol/L (ref 98–111)
Creatinine, Ser: 1.24 mg/dL — ABNORMAL HIGH (ref 0.44–1.00)
GFR, Estimated: 42 mL/min — ABNORMAL LOW (ref >60)
Glucose, Bld: 117 mg/dL — ABNORMAL HIGH (ref 70–99)
Potassium: 4.3 mmol/L (ref 3.5–5.1)
Sodium: 141 mmol/L (ref 135–145)
Total Bilirubin: 0.2 mg/dL (ref 0.0–1.2)
Total Protein: 7.7 g/dL (ref 6.5–8.1)

## 2024-07-18 LAB — RESP PANEL BY RT-PCR (RSV, FLU A&B, COVID)  RVPGX2
Influenza A by PCR: NEGATIVE
Influenza B by PCR: NEGATIVE
Resp Syncytial Virus by PCR: NEGATIVE
SARS Coronavirus 2 by RT PCR: NEGATIVE

## 2024-07-18 MED ORDER — SODIUM CHLORIDE 0.9 % IV BOLUS
1000.0000 mL | Freq: Once | INTRAVENOUS | Status: AC
  Administered 2024-07-18: 1000 mL via INTRAVENOUS

## 2024-07-18 MED ORDER — ZONISAMIDE 100 MG PO CAPS
100.0000 mg | ORAL_CAPSULE | Freq: Every day | ORAL | Status: DC
  Administered 2024-07-18 – 2024-07-19 (×2): 100 mg via ORAL
  Filled 2024-07-18 (×3): qty 1

## 2024-07-18 MED ORDER — ONDANSETRON HCL 4 MG/2ML IJ SOLN: 4.0000 mg | Freq: Four times a day (QID) | INTRAMUSCULAR | Status: DC | PRN

## 2024-07-18 MED ORDER — ACETAMINOPHEN 650 MG RE SUPP: 650.0000 mg | Freq: Four times a day (QID) | RECTAL | Status: DC | PRN

## 2024-07-18 MED ORDER — SODIUM CHLORIDE 0.9% FLUSH
3.0000 mL | Freq: Two times a day (BID) | INTRAVENOUS | Status: DC
  Administered 2024-07-19 – 2024-07-20 (×3): 3 mL via INTRAVENOUS

## 2024-07-18 MED ORDER — BISACODYL 5 MG PO TBEC: 5.0000 mg | DELAYED_RELEASE_TABLET | Freq: Every day | ORAL | Status: DC | PRN

## 2024-07-18 MED ORDER — SODIUM CHLORIDE 0.9 % IV SOLN
1.0000 g | Freq: Once | INTRAVENOUS | Status: AC
  Administered 2024-07-18: 1 g via INTRAVENOUS
  Filled 2024-07-18: qty 10

## 2024-07-18 MED ORDER — SODIUM CHLORIDE 0.9 % IV SOLN
1.0000 g | INTRAVENOUS | Status: DC
  Administered 2024-07-19: 1 g via INTRAVENOUS
  Filled 2024-07-18: qty 10

## 2024-07-18 MED ORDER — ONDANSETRON HCL 4 MG/2ML IJ SOLN: 4.0000 mg | Freq: Once | INTRAMUSCULAR | Status: DC

## 2024-07-18 MED ORDER — ONDANSETRON HCL 4 MG PO TABS: 4.0000 mg | ORAL_TABLET | Freq: Four times a day (QID) | ORAL | Status: DC | PRN

## 2024-07-18 MED ORDER — SENNOSIDES-DOCUSATE SODIUM 8.6-50 MG PO TABS: 1.0000 | ORAL_TABLET | Freq: Every evening | ORAL | Status: DC | PRN

## 2024-07-18 MED ORDER — LACTATED RINGERS IV SOLN: INTRAVENOUS | Status: AC

## 2024-07-18 MED ORDER — ACETAMINOPHEN 325 MG PO TABS
650.0000 mg | ORAL_TABLET | Freq: Four times a day (QID) | ORAL | Status: DC | PRN
  Filled 2024-07-18: qty 2

## 2024-07-18 MED ORDER — ENOXAPARIN SODIUM 40 MG/0.4ML IJ SOSY
40.0000 mg | PREFILLED_SYRINGE | INTRAMUSCULAR | Status: DC
  Administered 2024-07-19 – 2024-07-20 (×2): 40 mg via SUBCUTANEOUS
  Filled 2024-07-18 (×2): qty 0.4

## 2024-07-18 NOTE — ED Notes (Signed)
 Seizure pads in place

## 2024-07-18 NOTE — ED Provider Notes (Addendum)
 [place] EMERGENCY DEPARTMENT AT [facility] Provider Note   CSN: [id number] Arrival date & time: 07/18/24  1541     Patient presents with: Seizures   Katherine Obrien is a 83 y.o. female.   Patient here with possible seizure-like activity/unresponsive episode at her independent living.  Found by staff somewhat unresponsive and confused with vomiting.  She has a history of seizures.  She is on zonisamide .  Not sure if she has been taking this.  Maybe some memory issues.  She right now feels at her baseline.  She denies any chest pain shortness of breath weakness numbness tingling.  Denies any specific trauma.  Not having any extremity pain headache neck pain.  Does not sound like this was maybe witnessed.  She was seen by staff at her dependent living.  She has history of hypertension epilepsy.  The history is provided by the patient.       Prior to Admission medications   Medication Sig Start Date End Date Taking? Authorizing Provider  benzonatate  (TESSALON ) 100 MG capsule Take 1 capsule (100 mg total) by mouth 3 (three) times daily as needed for cough. Patient not taking: Reported on 04/18/2024 04/04/24   Mast, Man X, NP  Cholecalciferol (VITAMIN D -3) 25 MCG (1000 UT) CAPS TAKE 1 CAPSULE DAILY WITH BREAKFAST. 09/24/19   Mast, Man X, NP  guaiFENesin -dextromethorphan (ROBITUSSIN DM) 100-10 MG/5ML syrup Take 5 mLs by mouth every 4 (four) hours as needed for cough. 04/04/24   Mast, Man X, NP  ketoconazole (NIZORAL) 2 % shampoo Apply 1 application  topically once a week. As needed 04/23/19   [provider]  Multiple Vitamins-Minerals (ONE-A-DAY WOMENS 50 PLUS) TABS Take 1 tablet by mouth daily with breakfast.    [provider]  Multiple Vitamins-Minerals (PRESERVISION AREDS 2) CAPS Take 2 capsules by mouth daily.    [provider]  Polyethyl Glycol-Propyl Glycol (SYSTANE) 0.4-0.3 % SOLN Place 1-2 drops into both eyes 3 (three) times daily as needed  (for dryness).     [provider]  zonisamide  (ZONEGRAN ) 100 MG capsule Take 1 capsule (100 mg total) by mouth at bedtime. 02/19/24 08/17/24  Camara, Amadou, MD    Allergies: Barbiturates, Cefdinir, Codeine, Phenobarbital , Tegretol  [carbamazepine ], Tramadol, and Zonisamide     Review of Systems  Updated Vital Signs BP (!) 157/70   Pulse (!) 105   Temp 98 F (36.7 C) (Oral)   Resp 19   Ht 5' 4 (1.626 m)   Wt 91.4 kg   SpO2 94%   BMI 34.59 kg/m   Physical Exam Vitals and nursing note reviewed.  Constitutional:      General: She is not in acute distress.    Appearance: She is well-developed.  HENT:     Head: Normocephalic and atraumatic.     Nose: Nose normal.     Mouth/Throat:     Mouth: Mucous membranes are moist.  Eyes:     Extraocular Movements: Extraocular movements intact.     Conjunctiva/sclera: Conjunctivae normal.     Pupils: Pupils are equal, round, and reactive to light.  Cardiovascular:     Rate and Rhythm: Normal rate and regular rhythm.     Pulses: Normal pulses.     Heart sounds: Normal heart sounds. No murmur heard. Pulmonary:     Effort: Pulmonary effort is normal. No respiratory distress.     Breath sounds: Normal breath sounds.  Abdominal:     General: Abdomen is flat.  Palpations: Abdomen is soft.     Tenderness: There is no abdominal tenderness.  Musculoskeletal:        General: No swelling.     Cervical back: Normal range of motion and neck supple.  Skin:    General: Skin is warm and dry.     Capillary Refill: Capillary refill takes less than 2 seconds.  Neurological:     General: No focal deficit present.     Mental Status: She is alert and oriented to person, place, and time.     Cranial Nerves: No cranial nerve deficit.     Sensory: No sensory deficit.     Motor: No weakness.     Coordination: Coordination normal.     Comments: 5+ out of 5 strength, normal sensation, no drift, normal finger-to-nose finger, normal speech normal  visual fields  Psychiatric:        Mood and Affect: Mood normal.     (all labs ordered are listed, but only abnormal results are displayed) Labs Reviewed  CBC WITH DIFFERENTIAL/PLATELET - Abnormal; Notable for the following components:      Result Value   WBC 12.8 (*)    Neutro Abs 10.2 (*)    All other components within normal limits  COMPREHENSIVE METABOLIC PANEL WITH GFR - Abnormal; Notable for the following components:   Glucose, Bld 117 (*)    BUN 31 (*)    Creatinine, Ser 1.24 (*)    GFR, Estimated 42 (*)    All other components within normal limits  LIPASE, BLOOD - Abnormal; Notable for the following components:   Lipase 77 (*)    All other components within normal limits  URINALYSIS, ROUTINE W REFLEX MICROSCOPIC - Abnormal; Notable for the following components:   APPearance HAZY (*)    Protein, ur 30 (*)    Leukocytes,Ua MODERATE (*)    Bacteria, UA RARE (*)    All other components within normal limits  RESP PANEL BY RT-PCR (RSV, FLU A&B, COVID)  RVPGX2  URINE CULTURE    EKG: EKG Interpretation Date/Time:  Thursday July 18 2024 17:14:05 EST Ventricular Rate:  105 PR Interval:  189 QRS Duration:  92 QT Interval:  347 QTC Calculation: 459 R Axis:   78  Text Interpretation: Sinus tachycardia Confirmed by Ruthe Cornet [phone]) on 07/18/2024 5:26:21 PM  Radiology: CT Head Wo Contrast Result Date: 07/18/2024 CLINICAL DATA:  Unresponsive vomiting EXAM: CT HEAD WITHOUT CONTRAST CT CERVICAL SPINE WITHOUT CONTRAST TECHNIQUE: Multidetector CT imaging of the head and cervical spine was performed following the standard protocol without intravenous contrast. Multiplanar CT image reconstructions of the cervical spine were also generated. RADIATION DOSE REDUCTION: This exam was performed according to the departmental dose-optimization program which includes automated exposure control, adjustment of the mA and/or kV according to patient size and/or use of iterative  reconstruction technique. COMPARISON:  CT brain 03/10/2023, CT brain and cervical spine 03/26/2019 FINDINGS: CT HEAD FINDINGS Brain: No acute territorial infarction, hemorrhage or intracranial mass. Mild atrophy and chronic small vessel ischemic changes of the white matter. Stable ventricle size Vascular: No hyperdense vessel. Carotid vascular calcification radiologist Dr. Cortez of an Skull: Normal. Negative for fracture or focal lesion. Sinuses/Orbits: No acute finding. Other: None CT CERVICAL SPINE FINDINGS Alignment: Straightening of the cervical spine. Trace anterolisthesis C3 on C4 and C4 on C5. Facet alignment is normal Skull base and vertebrae: No acute fracture. No primary bone lesion or focal pathologic process. Soft tissues and spinal canal: No prevertebral fluid  or swelling. No visible canal hematoma. Disc levels: Multilevel degenerative change. Advanced disc space narrowing C5-C6. Multilevel facet degenerative change and ankylosis with multilevel foraminal stenosis. Posterior disc osteophyte eccentric to the right at C5-C6 results in suspected moderate canal stenosis. Upper chest: Negative. Other: None IMPRESSION: 1. No CT evidence for acute intracranial abnormality. Atrophy and chronic small vessel ischemic changes of the white matter. 2. Straightening of the cervical spine with multilevel degenerative change. No acute osseous abnormality. Electronically Signed   By: Luke Bun M.D.   On: 07/18/2024 17:20   CT Cervical Spine Wo Contrast Result Date: 07/18/2024 CLINICAL DATA:  Unresponsive vomiting EXAM: CT HEAD WITHOUT CONTRAST CT CERVICAL SPINE WITHOUT CONTRAST TECHNIQUE: Multidetector CT imaging of the head and cervical spine was performed following the standard protocol without intravenous contrast. Multiplanar CT image reconstructions of the cervical spine were also generated. RADIATION DOSE REDUCTION: This exam was performed according to the departmental dose-optimization program which  includes automated exposure control, adjustment of the mA and/or kV according to patient size and/or use of iterative reconstruction technique. COMPARISON:  CT brain 03/10/2023, CT brain and cervical spine 03/26/2019 FINDINGS: CT HEAD FINDINGS Brain: No acute territorial infarction, hemorrhage or intracranial mass. Mild atrophy and chronic small vessel ischemic changes of the white matter. Stable ventricle size Vascular: No hyperdense vessel. Carotid vascular calcification radiologist Dr. Cortez of an Skull: Normal. Negative for fracture or focal lesion. Sinuses/Orbits: No acute finding. Other: None CT CERVICAL SPINE FINDINGS Alignment: Straightening of the cervical spine. Trace anterolisthesis C3 on C4 and C4 on C5. Facet alignment is normal Skull base and vertebrae: No acute fracture. No primary bone lesion or focal pathologic process. Soft tissues and spinal canal: No prevertebral fluid or swelling. No visible canal hematoma. Disc levels: Multilevel degenerative change. Advanced disc space narrowing C5-C6. Multilevel facet degenerative change and ankylosis with multilevel foraminal stenosis. Posterior disc osteophyte eccentric to the right at C5-C6 results in suspected moderate canal stenosis. Upper chest: Negative. Other: None IMPRESSION: 1. No CT evidence for acute intracranial abnormality. Atrophy and chronic small vessel ischemic changes of the white matter. 2. Straightening of the cervical spine with multilevel degenerative change. No acute osseous abnormality. Electronically Signed   By: Luke Bun M.D.   On: 07/18/2024 17:20   DG Chest Portable 1 View Result Date: 07/18/2024 CLINICAL DATA:  Weakness. EXAM: PORTABLE CHEST 1 VIEW COMPARISON:  Chest radiograph dated 05/13/2023 FINDINGS: No focal consolidation, pleural effusion, or pneumothorax. The cardiac silhouette is within limits. Atherosclerotic calcification of the aorta. No acute osseous pathology. Degenerative changes of the spine and left  shoulder. IMPRESSION: No active disease. Electronically Signed   By: Vanetta Chou M.D.   On: 07/18/2024 16:47     Procedures   Medications Ordered in the ED  sodium chloride  0.9 % bolus 1,000 mL (has no administration in time range)  cefTRIAXone  (ROCEPHIN ) 1 g in sodium chloride  0.9 % 100 mL IVPB (has no administration in time range)  ondansetron  (ZOFRAN ) injection 4 mg (has no administration in time range)                                    Medical Decision Making Amount and/or Complexity of Data Reviewed Labs: ordered. Radiology: ordered.  Risk Prescription drug management. Decision regarding hospitalization.   [name] Console is here after possibly seizure activity.  History of the same.  Sounds like maybe has not been  taking her seizure meds.  Sounds like she is on zonisamide  despite this being an allergy she says that she takes this.  She has no complaints.  She remembers reading the news paper getting back to her room and then being woken up by staff.  She is feeling better now.  Sounds like she might have a seizure and threw up.  Similar to her prior seizure episodes.  She has no headache or neck pain.  She does not think she fell.  She is on any blood thinners.  She denies any chest pain shortness of breath.  Overall we will get basic labs head CT neck CT and reevaluate.  Differential diagnosis likely breakthrough seizure may be in setting of noncompliance may be in setting of some other stressor.  Seems less likely to be cardiac or pulmonary etiology.  Will get CT of the head and neck as I am not sure if she fell or not.  Patient per my review and interpretation of labs white count is 12.8.  Urinalysis does appear to be consistent with infection with moderate leukocytes.  Increased white blood cells count.  She is having some symptoms.  Overall lab work is unremarkable.  I do not think she has sepsis.  But she does live by herself in independent living.  I think it is  reasonable to IV hydrate her give her some IV antibiotics make sure she does not have any other seizure episodes.  Head CT neck CT were unremarkable.  Chest x-ray with no acute findings.  Overall will admit for further syncope workup observation and hydration.  Am concerned that she had unresponsive episode where she vomited in her mouth.  Not sure if this was seizure activity or some other cardiac process or arrhythmia.  Think is reasonable to ops overnight with telemetry further supportive care.  Admitted to hospital service.  This chart was dictated using voice recognition software.  Despite best efforts to proofread,  errors can occur which can change the documentation meaning.      Final diagnoses:  Seizure-like activity [employer])  Acute cystitis without hematuria    ED Discharge Orders     None          Ruthe Cornet, DO 07/18/24 1831    Ruthe Cornet, DO 07/18/24 1859

## 2024-07-18 NOTE — ED Triage Notes (Addendum)
 Pt BIB [facility] from [facility]. Pt found by staff at facility unresponsive w vomit coming out of mouth (unwitnessed seizure). Has a seizure hx. Last seizure was abt a couple months ago according to staff. Initially post ictal, but currently AO4. Non compliant w seizure meds. Pt currently presents w a dry cough says she has not had it long.   No pain,no obvious deformities   EMS vitals  BP 190/110 HR 110 SPO2 100  CBG 169

## 2024-07-18 NOTE — Hospital Course (Addendum)
 83yo with h/o seizure disorder, CKD stage IIIa, HTN, and HLD who presented on 11/6 with an unresponsive episode and then confusion with vomiting.  Concern for UTI, possible seizure.  Symptoms have resolved.  Urine culture pending, on Ceftriaxone .

## 2024-07-18 NOTE — H&P (Signed)
 History and Physical    Katherine Obrien:[id number] DOB: 03-28-1941 DOA: 07/18/2024  PCP: Mast, Man X, NP  Patient coming from: [facility]  I have personally briefly reviewed patient's old medical records in [facility]  Chief Complaint: Seizure activity  HPI: Katherine Obrien is a 83 y.o. female with medical history significant for history of disorder, CKD stage IIIa, HTN, HLD who presented to the ED from [facility] for evaluation after she had an unresponsive episode with vomiting.  Patient states that she was in her usual state of health.  She returned to her room after she had lunch in the common area when she began to feel nauseous.  The next thing she knows that she woke up in the ambulance.  Per report, she was found by staff somewhat unresponsive and confused with vomiting.  EMS were called and she was noted to be hypertensive with BP 190/110.  CBG was 169.  She was brought to the ED for further evaluation.  At time of admission, patient appears to be back to her baseline status.  She has been on zonisamide  for seizure prevention and states that she has been taking it almost regularly.  She also reports some recent dysuria and suprapubic discomfort.  She denies fevers, chills, diaphoresis, chest pain, dyspnea, diarrhea.  She reports that she ambulates with a walker at all times.  ED Course  Labs/Imaging on admission: I have personally reviewed following labs and imaging studies.  Initial vitals showed BP 166/82, pulse 112, RR 20, temp 98.0 F, SpO2 93% room air.  Labs showed WBC 12.8, hemoglobin 13.5, platelets 242, sodium 141, potassium 4.3, bicarb 26, BUN 31, creatinine 1.24, serum glucose 117, LFTs within normal limits, lipase 77.  Urinalysis showed negative nitrates, moderate leukocytes, 11-20 RBCs, >50 WBCs, rare bacteria.  Urine culture in process.  SARS-CoV-2, influenza, RSV PCR negative.  Portable chest x-ray negative  for focal consolidation, edema, effusion.  CT head negative for acute intracranial normality.  CT cervical spine negative for acute osseous abnormality.  Patient was given 1 L normal saline and IV ceftriaxone .  The hospitalist service was consulted for admission.  Review of Systems: All systems reviewed and are negative except as documented in history of present illness above.   Past Medical History:  Diagnosis Date   Arthritis    Cancer [facility])    skin   Cataract 2010   Dr. Donn, Dr.. Leverette @ [facility]   Epilepsy [facility]) 08/11/2015   Hyperlipidemia 12/08/2015   Hypertension    Hypothyroid    Polyp of colon 08/11/2015    Past Surgical History:  Procedure Laterality Date   ABDOMINAL HYSTERECTOMY  1970   APPENDECTOMY  1958   BREAST SURGERY  1984   COLON SURGERY  09/12/2006   FEMUR SURGERY  2018   Broken   TONSILLECTOMY  09/12/1944    Social History: Social History   Tobacco Use   Smoking status: Never   Smokeless tobacco: Never  Vaping Use   Vaping status: Never Used  Substance Use Topics   Alcohol  use: Never   Drug use: Never   Allergies  Allergen Reactions   Barbiturates Other (See Comments)    A small amount overdosed the patient- affected my stability and balance (might have been given at the same as another med?)   Cefdinir Other (See Comments)    A small amount overdosed the patient- affected my stability and balance (might have been given at  the same as another med?)   Codeine Other (See Comments)    Moderate, per pharmacy   Phenobarbital  Other (See Comments)    A small amount overdosed the patient- affected my stability and balance (might have been given at the same as another med?)   Tegretol  [Carbamazepine ] Other (See Comments)    A small amount overdosed the patient- affected my stability and balance (might have been given at the same as another med?)   Tramadol Other (See Comments)    Moderate, per pharmacy    Family History   Problem Relation Age of Onset   Osteoporosis Mother    Dementia Mother    Arthritis Mother    Stroke Father 70       mini   Dementia Father    CVA Father    Breast cancer Sister    Cancer Sister        breast   Dementia Sister        severe   Alzheimer's disease Sister    Diabetes Sister    Hypertension Sister    Stroke Paternal Grandfather        1970     Prior to Admission medications   Medication Sig Start Date End Date Taking? Authorizing Provider  benzonatate  (TESSALON ) 100 MG capsule Take 1 capsule (100 mg total) by mouth 3 (three) times daily as needed for cough. Patient not taking: Reported on 04/18/2024 04/04/24   Mast, Man X, NP  Cholecalciferol (VITAMIN D -3) 25 MCG (1000 UT) CAPS TAKE 1 CAPSULE DAILY WITH BREAKFAST. 09/24/19   Mast, Man X, NP  guaiFENesin -dextromethorphan (ROBITUSSIN DM) 100-10 MG/5ML syrup Take 5 mLs by mouth every 4 (four) hours as needed for cough. 04/04/24   Mast, Man X, NP  ketoconazole (NIZORAL) 2 % shampoo Apply 1 application  topically once a week. As needed 04/23/19   [provider]  Multiple Vitamins-Minerals (ONE-A-DAY WOMENS 50 PLUS) TABS Take 1 tablet by mouth daily with breakfast.    [provider]  Multiple Vitamins-Minerals (PRESERVISION AREDS 2) CAPS Take 2 capsules by mouth daily.    [provider]  Polyethyl Glycol-Propyl Glycol (SYSTANE) 0.4-0.3 % SOLN Place 1-2 drops into both eyes 3 (three) times daily as needed (for dryness).     [provider]  zonisamide  (ZONEGRAN ) 100 MG capsule Take 1 capsule (100 mg total) by mouth at bedtime. 02/19/24 08/17/24  Gregg Lek, MD    Physical Exam: Vitals:   07/18/24 1630 07/18/24 1804 07/18/24 1930 07/18/24 2041  BP: (!) 176/87 (!) 157/70 (!) 150/72   Pulse: (!) 105  (!) 101   Resp: 20 19 20    Temp:    97.8 F (36.6 C)  TempSrc:    Oral  SpO2: 94% 94% 93%   Weight:      Height:       Constitutional: In bed with head elevated.  NAD, calm,  comfortable Eyes: EOMI, lids and conjunctivae normal ENMT: Mucous membranes are moist. Posterior pharynx clear of any exudate or lesions.Normal dentition.  Neck: normal, supple, no masses. Respiratory: clear to auscultation bilaterally, no wheezing, no crackles. Normal respiratory effort. No accessory muscle use.  Cardiovascular: Regular rate and rhythm, no murmurs / rubs / gallops. No extremity edema. 2+ pedal pulses. Abdomen: no tenderness, no masses palpated. Musculoskeletal: no clubbing / cyanosis. No joint deformity upper and lower extremities. Good ROM, no contractures. Normal muscle tone.  Skin: no rashes, lesions, ulcers. No induration Neurologic: Sensation intact. Strength 5/5 in all  4.  Psychiatric: Normal judgment and insight. Alert and oriented x 3. Normal mood.   EKG: Personally reviewed. Sinus tachycardia, rate 105, no acute ischemic changes.  Assessment/Plan Principal Problem:   Seizure disorder [facility]) Active Problems:   Urinary tract infection   Chronic kidney disease, stage 3a (HCC)   Wannetta Langland is a 83 y.o. female with medical history significant for history of disorder, CKD stage IIIa, HTN, HLD who is admitted with suspected seizure PTA in setting of UTI.  Assessment and Plan: Unresponsive episode/suspected seizure activity in history of epilepsy: Patient presenting after suspected seizure activity at her facility prior to coming to the ED.  Reportedly postictal on arrival but mentation appears to be improving. - Resume home zonisamide  100 mg nightly - Seizure precautions - Follow-up with neurology as outpatient  Urinary tract infection: UA shows moderate leukocytes, >50 WBCs, rare bacteria.  Patient does report urinary symptoms.  Started on IV ceftriaxone .  Urine culture in process.  CKD stage IIIa: Creatinine slightly increased from baseline.  Continue IV fluids and recheck labs in AM.   DVT prophylaxis: enoxaparin  (LOVENOX ) injection 40 mg Start:  07/19/24 1000 Code Status: Full code, discussed with patient on admission Family Communication: Discussed with patient, she has discussed with family Disposition Plan: From independent living and likely return to same facility pending clinical progress Consults called: None Severity of Illness: The appropriate patient status for this patient is OBSERVATION. Observation status is judged to be reasonable and necessary in order to provide the required intensity of service to ensure the patient's safety. The patient's presenting symptoms, physical exam findings, and initial radiographic and laboratory data in the context of their medical condition is felt to place them at decreased risk for further clinical deterioration. Furthermore, it is anticipated that the patient will be medically stable for discharge from the hospital within 2 midnights of admission.   Jorie Blanch MD [facility]  If 7PM-7AM, please contact night-coverage [email or web address]  07/18/2024, 11:21 PM

## 2024-07-18 NOTE — ED Notes (Signed)
Unable to obtain labs at this time. Pt in CT.

## 2024-07-19 DIAGNOSIS — R404 Transient alteration of awareness: Secondary | ICD-10-CM | POA: Diagnosis not present

## 2024-07-19 DIAGNOSIS — G40909 Epilepsy, unspecified, not intractable, without status epilepticus: Secondary | ICD-10-CM | POA: Diagnosis not present

## 2024-07-19 LAB — BASIC METABOLIC PANEL WITH GFR
Anion gap: 11 (ref 5–15)
BUN: 25 mg/dL — ABNORMAL HIGH (ref 8–23)
CO2: 24 mmol/L (ref 22–32)
Calcium: 9 mg/dL (ref 8.9–10.3)
Chloride: 102 mmol/L (ref 98–111)
Creatinine, Ser: 0.92 mg/dL (ref 0.44–1.00)
GFR, Estimated: >60 mL/min (ref >60)
Glucose, Bld: 114 mg/dL — ABNORMAL HIGH (ref 70–99)
Potassium: 4.2 mmol/L (ref 3.5–5.1)
Sodium: 137 mmol/L (ref 135–145)

## 2024-07-19 LAB — CBC
HCT: 40.8 % (ref 36.0–46.0)
Hemoglobin: 12.5 g/dL (ref 12.0–15.0)
MCH: 27.4 pg (ref 26.0–34.0)
MCHC: 30.6 g/dL (ref 30.0–36.0)
MCV: 89.5 fL (ref 80.0–100.0)
Platelets: 240 K/uL (ref 150–400)
RBC: 4.56 MIL/uL (ref 3.87–5.11)
RDW: 14.8 % (ref 11.5–15.5)
WBC: 17.8 K/uL — ABNORMAL HIGH (ref 4.0–10.5)
nRBC: 0 % (ref 0.0–0.2)

## 2024-07-19 MED ORDER — LACTATED RINGERS IV SOLN: INTRAVENOUS | Status: AC

## 2024-07-19 NOTE — Progress Notes (Signed)
 PROGRESS NOTE    Katherine Obrien  FMW:[id number] DOB: 1940-09-17 DOA: 07/18/2024 PCP: Mast, Man X, NP   Brief Narrative: 83 year old with past medical history significant for CKD stage IIIa, hypertension, hyperlipidemia, seizure disorder who presents to the ED from friend's home independent living after she had an unresponsive episode following by vomiting.  Per staff report patient was found unresponsive and confused and vomiting.  EMS was called and patient was noted to be hypertensive systolic blood pressure 190s, CBG 169.  At the time of admission patient appears to be back to baseline.  Evaluation in the ED UA showed more than 50 white blood cell, CT head negative for acute intracranial abnormalities.  Chest x-ray negative for focal consolidation.  Patient was admitted for further evaluation.   Assessment & Plan:   Principal Problem:   Seizure disorder [facility]) Active Problems:   Urinary tract infection   Chronic kidney disease, stage 3a (HCC)   1-Unresponsive episode, altered mental status ? suspected seizure activity/ vs Syncope.  History of epilepsy - Continue Zonisamide .  -Check orthostatic vital -Continue with IV fluids.  -Monitor on telemetry.   Urinary tract infection: UA with more than 50 white blood cell. Report Dysuria.  Continue with Ceftriaxone .  Follow urine culture.   CKD stage IIIa: -stable.  Cr range 1.01--0.9   Estimated body mass index is 34.59 kg/m as calculated from the following:   Height as of this encounter: 5' 4 (1.626 m).   Weight as of this encounter: 91.4 kg.   DVT prophylaxis: Lovenox  Code Status: Full code Family Communication: Care discussed with patient Disposition Plan:  Status is: Observation The patient remains OBS appropriate and will d/c before 2 midnights.    Consultants:  None  Procedures:    Antimicrobials:    Subjective: She is alert and conversant.  She reports dysuria.  She wanted to know why she needs to  get Lovenox .  Explained to her for DVT prophylaxis while she is in the hospital. She has lost consciousness yesterday, she denies urinary or bowel incontinence, she did not bite her tongue.  Objective: Vitals:   07/19/24 0222 07/19/24 0230 07/19/24 0430 07/19/24 0600  BP:  (!) 170/95 (!) 157/73 116/60  Pulse:  94 95 89  Resp:  17 19 18   Temp: 99.4 F (37.4 C)   99.2 F (37.3 C)  TempSrc: Oral     SpO2:  94% 94% 93%  Weight:      Height:        Intake/Output Summary (Last 24 hours) at 07/19/2024 0739 Last data filed at 07/19/2024 0223 Gross per 24 hour  Intake 101.26 ml  Output 800 ml  Net -698.74 ml   Filed Weights   07/18/24 1551  Weight: 91.4 kg    Examination:  General exam: Appears calm and comfortable  Respiratory system: Clear to auscultation. Respiratory effort normal. Cardiovascular system: S1 & S2 heard, RRR. No JVD, murmurs, rubs, gallops or clicks. No pedal edema. Gastrointestinal system: Abdomen is nondistended, soft and nontender. No organomegaly or masses felt. Normal bowel sounds heard. Central nervous system: Alert and oriented.  Follows command, moves all 4 extremities. Extremities: Symmetric 5 x 5 power.    Data Reviewed: I have personally reviewed following labs and imaging studies  CBC: Recent Labs  Lab 07/18/24 1719 07/19/24 0422  WBC 12.8* 17.8*  NEUTROABS 10.2*  --   HGB 13.5 12.5  HCT 43.4 40.8  MCV 91.0 89.5  PLT 242 240   Basic Metabolic  Panel: Recent Labs  Lab 07/18/24 1719 07/19/24 0422  NA 141 137  K 4.3 4.2  CL 105 102  CO2 26 24  GLUCOSE 117* 114*  BUN 31* 25*  CREATININE 1.24* 0.92  CALCIUM  9.8 9.0   GFR: Estimated Creatinine Clearance: 49 mL/min (by C-G formula based on SCr of 0.92 mg/dL). Liver Function Tests: Recent Labs  Lab 07/18/24 1719  AST 36  ALT 25  ALKPHOS 125  BILITOT 0.2  PROT 7.7  ALBUMIN 3.8   Recent Labs  Lab 07/18/24 1719  LIPASE 77*   No results for input(s): AMMONIA in the last  168 hours. Coagulation Profile: No results for input(s): INR, PROTIME in the last 168 hours. Cardiac Enzymes: No results for input(s): CKTOTAL, CKMB, CKMBINDEX, TROPONINI in the last 168 hours. BNP (last 3 results) No results for input(s): PROBNP in the last 8760 hours. HbA1C: No results for input(s): HGBA1C in the last 72 hours. CBG: No results for input(s): GLUCAP in the last 168 hours. Lipid Profile: No results for input(s): CHOL, HDL, LDLCALC, TRIG, CHOLHDL, LDLDIRECT in the last 72 hours. Thyroid  Function Tests: No results for input(s): TSH, T4TOTAL, FREET4, T3FREE, THYROIDAB in the last 72 hours. Anemia Panel: No results for input(s): VITAMINB12, FOLATE, FERRITIN, TIBC, IRON, RETICCTPCT in the last 72 hours. Sepsis Labs: No results for input(s): PROCALCITON, LATICACIDVEN in the last 168 hours.  Recent Results (from the past 240 hours)  Resp panel by RT-PCR (RSV, Flu A&B, Covid) Anterior Nasal Swab     Status: None   Collection Time: 07/18/24  5:19 PM   Specimen: Anterior Nasal Swab  Result Value Ref Range Status   SARS Coronavirus 2 by RT PCR NEGATIVE NEGATIVE Final    Comment: (NOTE) SARS-CoV-2 target nucleic acids are NOT DETECTED.  The SARS-CoV-2 RNA is generally detectable in upper respiratory specimens during the acute phase of infection. The lowest concentration of SARS-CoV-2 viral copies this assay can detect is 138 copies/mL. A negative result does not preclude SARS-Cov-2 infection and should not be used as the sole basis for treatment or other patient management decisions. A negative result may occur with  improper specimen collection/handling, submission of specimen other than nasopharyngeal swab, presence of viral mutation(s) within the areas targeted by this assay, and inadequate number of viral copies(<138 copies/mL). A negative result must be combined with clinical observations, patient history, and  epidemiological information. The expected result is Negative.  Fact Sheet for Patients:  bloggercourse.com  Fact Sheet for Healthcare Providers:  seriousbroker.it  This test is no t yet approved or cleared by the [place]  FDA and  has been authorized for detection and/or diagnosis of SARS-CoV-2 by FDA under an Emergency Use Authorization (EUA). This EUA will remain  in effect (meaning this test can be used) for the duration of the COVID-19 declaration under Section 564(b)(1) of the Act, 21 U.S.C.section 360bbb-3(b)(1), unless the authorization is terminated  or revoked sooner.       Influenza A by PCR NEGATIVE NEGATIVE Final   Influenza B by PCR NEGATIVE NEGATIVE Final    Comment: (NOTE) The Xpert Xpress SARS-CoV-2/FLU/RSV plus assay is intended as an aid in the diagnosis of influenza from Nasopharyngeal swab specimens and should not be used as a sole basis for treatment. Nasal washings and aspirates are unacceptable for Xpert Xpress SARS-CoV-2/FLU/RSV testing.  Fact Sheet for Patients: bloggercourse.com  Fact Sheet for Healthcare Providers: seriousbroker.it  This test is not yet approved or cleared by the [place]  FDA and  has been authorized for detection and/or diagnosis of SARS-CoV-2 by FDA under an Emergency Use Authorization (EUA). This EUA will remain in effect (meaning this test can be used) for the duration of the COVID-19 declaration under Section 564(b)(1) of the Act, 21 U.S.C. section 360bbb-3(b)(1), unless the authorization is terminated or revoked.     Resp Syncytial Virus by PCR NEGATIVE NEGATIVE Final    Comment: (NOTE) Fact Sheet for Patients: bloggercourse.com  Fact Sheet for Healthcare Providers: seriousbroker.it  This test is not yet approved or cleared by the [place]  FDA and has been  authorized for detection and/or diagnosis of SARS-CoV-2 by FDA under an Emergency Use Authorization (EUA). This EUA will remain in effect (meaning this test can be used) for the duration of the COVID-19 declaration under Section 564(b)(1) of the Act, 21 U.S.C. section 360bbb-3(b)(1), unless the authorization is terminated or revoked.  Performed at [facility], [address], [place], [place] [address]          Radiology Studies: CT Head Wo Contrast Result Date: 07/18/2024 CLINICAL DATA:  Unresponsive vomiting EXAM: CT HEAD WITHOUT CONTRAST CT CERVICAL SPINE WITHOUT CONTRAST TECHNIQUE: Multidetector CT imaging of the head and cervical spine was performed following the standard protocol without intravenous contrast. Multiplanar CT image reconstructions of the cervical spine were also generated. RADIATION DOSE REDUCTION: This exam was performed according to the departmental dose-optimization program which includes automated exposure control, adjustment of the mA and/or kV according to patient size and/or use of iterative reconstruction technique. COMPARISON:  CT brain 03/10/2023, CT brain and cervical spine 03/26/2019 FINDINGS: CT HEAD FINDINGS Brain: No acute territorial infarction, hemorrhage or intracranial mass. Mild atrophy and chronic small vessel ischemic changes of the white matter. Stable ventricle size Vascular: No hyperdense vessel. Carotid vascular calcification radiologist Dr. Cortez of an Skull: Normal. Negative for fracture or focal lesion. Sinuses/Orbits: No acute finding. Other: None CT CERVICAL SPINE FINDINGS Alignment: Straightening of the cervical spine. Trace anterolisthesis C3 on C4 and C4 on C5. Facet alignment is normal Skull base and vertebrae: No acute fracture. No primary bone lesion or focal pathologic process. Soft tissues and spinal canal: No prevertebral fluid or swelling. No visible canal hematoma. Disc levels: Multilevel degenerative change.  Advanced disc space narrowing C5-C6. Multilevel facet degenerative change and ankylosis with multilevel foraminal stenosis. Posterior disc osteophyte eccentric to the right at C5-C6 results in suspected moderate canal stenosis. Upper chest: Negative. Other: None IMPRESSION: 1. No CT evidence for acute intracranial abnormality. Atrophy and chronic small vessel ischemic changes of the white matter. 2. Straightening of the cervical spine with multilevel degenerative change. No acute osseous abnormality. Electronically Signed   By: Luke Bun M.D.   On: 07/18/2024 17:20   CT Cervical Spine Wo Contrast Result Date: 07/18/2024 CLINICAL DATA:  Unresponsive vomiting EXAM: CT HEAD WITHOUT CONTRAST CT CERVICAL SPINE WITHOUT CONTRAST TECHNIQUE: Multidetector CT imaging of the head and cervical spine was performed following the standard protocol without intravenous contrast. Multiplanar CT image reconstructions of the cervical spine were also generated. RADIATION DOSE REDUCTION: This exam was performed according to the departmental dose-optimization program which includes automated exposure control, adjustment of the mA and/or kV according to patient size and/or use of iterative reconstruction technique. COMPARISON:  CT brain 03/10/2023, CT brain and cervical spine 03/26/2019 FINDINGS: CT HEAD FINDINGS Brain: No acute territorial infarction, hemorrhage or intracranial mass. Mild atrophy and chronic small vessel ischemic changes of the white matter. Stable ventricle size Vascular: No hyperdense vessel.  Carotid vascular calcification radiologist Dr. Cortez of an Skull: Normal. Negative for fracture or focal lesion. Sinuses/Orbits: No acute finding. Other: None CT CERVICAL SPINE FINDINGS Alignment: Straightening of the cervical spine. Trace anterolisthesis C3 on C4 and C4 on C5. Facet alignment is normal Skull base and vertebrae: No acute fracture. No primary bone lesion or focal pathologic process. Soft tissues and spinal  canal: No prevertebral fluid or swelling. No visible canal hematoma. Disc levels: Multilevel degenerative change. Advanced disc space narrowing C5-C6. Multilevel facet degenerative change and ankylosis with multilevel foraminal stenosis. Posterior disc osteophyte eccentric to the right at C5-C6 results in suspected moderate canal stenosis. Upper chest: Negative. Other: None IMPRESSION: 1. No CT evidence for acute intracranial abnormality. Atrophy and chronic small vessel ischemic changes of the white matter. 2. Straightening of the cervical spine with multilevel degenerative change. No acute osseous abnormality. Electronically Signed   By: Luke Bun M.D.   On: 07/18/2024 17:20   DG Chest Portable 1 View Result Date: 07/18/2024 CLINICAL DATA:  Weakness. EXAM: PORTABLE CHEST 1 VIEW COMPARISON:  Chest radiograph dated 05/13/2023 FINDINGS: No focal consolidation, pleural effusion, or pneumothorax. The cardiac silhouette is within limits. Atherosclerotic calcification of the aorta. No acute osseous pathology. Degenerative changes of the spine and left shoulder. IMPRESSION: No active disease. Electronically Signed   By: Vanetta Chou M.D.   On: 07/18/2024 16:47        Scheduled Meds:  enoxaparin  (LOVENOX ) injection  40 mg Subcutaneous Q24H   sodium chloride  flush  3 mL Intravenous Q12H   zonisamide   100 mg Oral QHS   Continuous Infusions:  cefTRIAXone  (ROCEPHIN )  IV     lactated ringers  125 mL/hr at 07/18/24 2322     LOS: 0 days    Time spent: 35 Minutes    Katerine Morua A Angelice Piech, MD [facility]   If 7PM-7AM, please contact night-coverage [email or web address]  07/19/2024, 7:39 AM

## 2024-07-19 NOTE — Evaluation (Signed)
 Occupational Therapy Evaluation Patient Details Name: Katherine Obrien MRN: [id number] DOB: [birth year] Today's Date: 07/19/2024   History of Present Illness   Katherine Obrien is a 83 yr old female admitted to the hospital after a unresponsive episode, due to possible seizure. PMH: CKD III, HTN, HLD, arthritis, epilepsy, femur fracture     Clinical Impressions The pt is currently presenting slightly below her baseline level of functioning for self-care management. She is limited by the below listed deficits (see OT problem list). At current, she requires CGA to min assist for tasks, including sit to stand, toileting at bathroom level and lower body dressing. She will benefit from further OT services to maximize her independence with self care tasks and to decrease the risk for restricted participation in meaningful activities. OT recommends the pt return her her ILF at discharge with the resumption of therapy services.      If plan is discharge home, recommend the following:   A little help with bathing/dressing/bathroom;Assistance with cooking/housework     Functional Status Assessment   Patient has had a recent decline in their functional status and demonstrates the ability to make significant improvements in function in a reasonable and predictable amount of time.     Equipment Recommendations   None recommended by OT     Recommendations for Other Services         Precautions/Restrictions   Restrictions Weight Bearing Restrictions Per Provider Order: No     Mobility Bed Mobility Overal bed mobility: Needs Assistance Bed Mobility: Supine to Sit     Supine to sit: HOB elevated, Used rails, Supervision          Transfers Overall transfer level: Needs assistance Equipment used: Rolling walker (2 wheels) Transfers: Sit to/from Stand Sit to Stand: Contact guard assist, From elevated surface           Balance     Sitting balance-Leahy Scale: Good          Standing balance comment: CGA with RW          ADL either performed or assessed with clinical judgement   ADL Overall ADL's : Needs assistance/impaired Eating/Feeding: Independent;Sitting   Grooming: Contact guard assist;Standing;Wash/dry hands   Upper Body Bathing: Set up;Sitting   Lower Body Bathing: Minimal assistance;Sitting/lateral leans   Upper Body Dressing : Set up;Sitting   Lower Body Dressing: Minimal assistance;Sitting/lateral leans Lower Body Dressing Details (indicate cue type and reason): She required light assist to donn her tennis shoes seated EOB. Toilet Transfer: Contact guard assist;Rolling walker (2 wheels);Grab bars;Ambulation Toilet Transfer Details (indicate cue type and reason): She ambulated to and from the bathroom in her room using a RW. Toileting- Clothing Manipulation and Hygiene: Minimal assistance;Sit to/from stand               Vision   Additional Comments: She correctly read the time depicted on the wall clock.            Pertinent Vitals/Pain Pain Assessment Pain Assessment: No/denies pain     Extremity/Trunk Assessment Upper Extremity Assessment Upper Extremity Assessment: Overall WFL for tasks assessed;Right hand dominant   Lower Extremity Assessment Lower Extremity Assessment: LLE deficits/detail;RLE deficits/detail;Generalized weakness RLE Deficits / Details: AROM WFL LLE Deficits / Details: AROM WFL       Communication Communication Communication: No apparent difficulties   Cognition Arousal: Alert Behavior During Therapy: WFL for tasks assessed/performed Cognition: No apparent impairments  OT - Cognition Comments: Oriented x4          Following commands: Intact       Cueing  General Comments   Cueing Techniques: Verbal cues              Home Living Family/patient expects to be discharged to:: Other (Comment)     Type of Home: Independent living facility       Home  Layout: One level     Bathroom Shower/Tub: Walk-in shower         Home Equipment: Shower seat   Additional Comments: Resides at Independent Living at [facility]. She has been receiving home health therapy.      Prior Functioning/Environment Prior Level of Function : Independent/Modified Independent             Mobility Comments: She used a rollator for ambulation. ADLs Comments: She was modified independent to independent with ADLs. Meals are provided at the facility.    OT Problem List: Decreased strength;Impaired balance (sitting and/or standing)   OT Treatment/Interventions: Self-care/ADL training;Therapeutic activities;Therapeutic exercise;Energy conservation;DME and/or AE instruction;Patient/family education;Balance training      OT Goals(Current goals can be found in the care plan section)   Acute Rehab OT Goals OT Goal Formulation: With patient Time For Goal Achievement: 08/02/24 Potential to Achieve Goals: Good ADL Goals Pt Will Perform Grooming: with modified independence;standing Pt Will Perform Lower Body Dressing: with modified independence;sitting/lateral leans;sit to/from stand Pt Will Transfer to Toilet: with modified independence;ambulating Pt Will Perform Toileting - Clothing Manipulation and hygiene: with modified independence;sit to/from stand   OT Frequency:  Min 2X/week       AM-PAC OT 6 Clicks Daily Activity     Outcome Measure Help from another person eating meals?: None Help from another person taking care of personal grooming?: A Little Help from another person toileting, which includes using toliet, bedpan, or urinal?: A Little Help from another person bathing (including washing, rinsing, drying)?: A Little Help from another person to put on and taking off regular upper body clothing?: A Little Help from another person to put on and taking off regular lower body clothing?: A Little 6 Click Score: 19   End of Session Equipment  Utilized During Treatment: Rolling walker (2 wheels);Gait belt Nurse Communication: Mobility status  Activity Tolerance: Patient tolerated treatment well Patient left: in chair;with call bell/phone within reach  OT Visit Diagnosis: Muscle weakness (generalized) (M62.81)                Time: 8471-8449 OT Time Calculation (min): 22 min Charges:  OT General Charges $OT Visit: 1 Visit OT Evaluation $OT Eval Moderate Complexity: 1 Mod    Alexiah Koroma J Harris,OTR/L 07/19/2024, 5:28 PM

## 2024-07-20 ENCOUNTER — Other Ambulatory Visit (HOSPITAL_COMMUNITY): Payer: Self-pay

## 2024-07-20 DIAGNOSIS — R404 Transient alteration of awareness: Secondary | ICD-10-CM | POA: Diagnosis not present

## 2024-07-20 LAB — CBC
HCT: 39.2 % (ref 36.0–46.0)
Hemoglobin: 12 g/dL (ref 12.0–15.0)
MCH: 27.5 pg (ref 26.0–34.0)
MCHC: 30.6 g/dL (ref 30.0–36.0)
MCV: 89.7 fL (ref 80.0–100.0)
Platelets: 213 K/uL (ref 150–400)
RBC: 4.37 MIL/uL (ref 3.87–5.11)
RDW: 15 % (ref 11.5–15.5)
WBC: 11.2 K/uL — ABNORMAL HIGH (ref 4.0–10.5)
nRBC: 0 % (ref 0.0–0.2)

## 2024-07-20 MED ORDER — AMOXICILLIN 500 MG PO CAPS
500.0000 mg | ORAL_CAPSULE | Freq: Two times a day (BID) | ORAL | 0 refills | Status: AC
  Filled 2024-07-20: qty 10, 5d supply, fill #0

## 2024-07-20 MED ORDER — SENNOSIDES-DOCUSATE SODIUM 8.6-50 MG PO TABS: 1.0000 | ORAL_TABLET | Freq: Every evening | ORAL | 0 refills | Status: DC | PRN

## 2024-07-20 MED ORDER — SENNOSIDES-DOCUSATE SODIUM 8.6-50 MG PO TABS
1.0000 | ORAL_TABLET | Freq: Every evening | ORAL | 0 refills | Status: AC | PRN
  Filled 2024-07-20: qty 30, 30d supply, fill #0

## 2024-07-20 MED ORDER — AMOXICILLIN 500 MG PO TABS: 500.0000 mg | ORAL_TABLET | Freq: Two times a day (BID) | ORAL | 0 refills | Status: DC

## 2024-07-20 NOTE — Progress Notes (Signed)
 Per patient, meds are usually delivered from [facility], but it is  not available on the weekend. Request sent to Dr Barbarann to switch meds to [facility] - call to [facility] to cancel discharge prescriptions. Patient also states [facility] should have someone available to pick her up.

## 2024-07-20 NOTE — Plan of Care (Signed)
?  Problem: Health Behavior/Discharge Planning: ?Goal: Ability to manage health-related needs will improve ?Outcome: Progressing ?  ?Problem: Clinical Measurements: ?Goal: Ability to maintain clinical measurements within normal limits will improve ?Outcome: Progressing ?  ?Problem: Clinical Measurements: ?Goal: Will remain free from infection ?Outcome: Progressing ?  ?Problem: Activity: ?Goal: Risk for activity intolerance will decrease ?Outcome: Progressing ?  ?

## 2024-07-20 NOTE — Evaluation (Addendum)
 Physical Therapy Evaluation Patient Details Name: Katherine Obrien MRN: [id number] DOB: May 16, 1941 Today's Date: 07/20/2024  History of Present Illness  Katherine Obrien is a 83 yr old female admitted to the hospital after a unresponsive episode, due to possible seizure. PMH: CKD III, HTN, HLD, arthritis, epilepsy, femur fracture  Clinical Impression  Patient presents with decreased mobility due to generalized weakness, decreased endurance and balance after bedrest.  She was living alone at independent living apartment and completing her own ADL's and some IADL's.  She was using rollator and walking to meals.  Currently needing CGA to S for mobility in the room and for hallway ambulation.  Discussed resuming HHPT at d/c and pt unsure who to contact to set up in room meals initially.  PT will follow up if pt not d/c.     Orthostatic VS for the past 24 hrs (Last 3 readings):  BP- Lying Pulse- Lying BP- Sitting Pulse- Sitting BP- Standing at 0 minutes Pulse- Standing at 0 minutes  07/20/24 1155 124/79 79 126/64 80 126/67 98       If plan is discharge home, recommend the following: Assistance with cooking/housework;Assist for transportation;Help with stairs or ramp for entrance   Can travel by private vehicle        Equipment Recommendations None recommended by PT  Recommendations for Other Services       Functional Status Assessment Patient has had a recent decline in their functional status and demonstrates the ability to make significant improvements in function in a reasonable and predictable amount of time.     Precautions / Restrictions Precautions Precautions: Fall Precaution/Restrictions Comments: seizure      Mobility  Bed Mobility         Supine to sit: HOB elevated, Supervision     General bed mobility comments: increased time, no physical help    Transfers   Equipment used: Rolling walker (2 wheels) Transfers: Sit to/from Stand, Bed to chair/wheelchair/BSC Sit  to Stand: Contact guard assist   Step pivot transfers: Contact guard assist       General transfer comment: from standard height bed, S up from [facility]; bed >BSC with S to CGA for safety    Ambulation/Gait Ambulation/Gait assistance: Contact guard assist, Supervision Gait Distance (Feet): 200 Feet Assistive device: Rolling walker (2 wheels) Gait Pattern/deviations: Step-through pattern, Decreased stride length, Trunk flexed       General Gait Details: cues intermittently for forward gaze, reports has been a long time since she walked with standard RW (uses rollator at baseline) CGA initially progressing to S, cues for turning  Stairs            Wheelchair Mobility     Tilt Bed    Modified Rankin (Stroke Patients Only)       Balance Overall balance assessment: Needs assistance   Sitting balance-Leahy Scale: Good     Standing balance support: No upper extremity supported, During functional activity Standing balance-Leahy Scale: Fair Standing balance comment: pulling up briefs after toileting, some assist for getting them up all the way                             Pertinent Vitals/Pain Pain Assessment Pain Assessment: No/denies pain    Home Living Family/patient expects to be discharged to:: Private residence Living Arrangements: Alone   Type of Home: Independent living facility Home Access: Level entry       Home Layout: One level  Home Equipment: Educational Psychologist (4 wheels) Additional Comments: Resides at Independent Living at [facility]. She has been receiving home health therapy.    Prior Function Prior Level of Function : Independent/Modified Independent             Mobility Comments: She used a rollator for ambulation. ADLs Comments: She was modified independent to independent with ADLs. Meals are provided at the facility (walks to dining room); gets houskeeping twice a month, does her own laundry     Extremity/Trunk  Assessment   Upper Extremity Assessment Upper Extremity Assessment: Defer to OT evaluation    Lower Extremity Assessment Lower Extremity Assessment: Generalized weakness    Cervical / Trunk Assessment Cervical / Trunk Assessment: Kyphotic  Communication   Communication Communication: Impaired Factors Affecting Communication: Hearing impaired    Cognition Arousal: Alert Behavior During Therapy: Flat affect                             Following commands: Intact       Cueing Cueing Techniques: Verbal cues     General Comments General comments (skin integrity, edema, etc.): toileted on BSC; seated EOB donned shoes on her own    Exercises     Assessment/Plan    PT Assessment Patient needs continued PT services  PT Problem List Decreased strength;Decreased balance;Decreased mobility       PT Treatment Interventions DME instruction;Gait training;Patient/family education;Functional mobility training;Therapeutic activities;Therapeutic exercise;Balance training    PT Goals (Current goals can be found in the Care Plan section)  Acute Rehab PT Goals Patient Stated Goal: return home PT Goal Formulation: With patient Time For Goal Achievement: 08/03/24 Potential to Achieve Goals: Good    Frequency Min 2X/week     Co-evaluation               AM-PAC PT 6 Clicks Mobility  Outcome Measure Help needed turning from your back to your side while in a flat bed without using bedrails?: None Help needed moving from lying on your back to sitting on the side of a flat bed without using bedrails?: None Help needed moving to and from a bed to a chair (including a wheelchair)?: A Little Help needed standing up from a chair using your arms (e.g., wheelchair or bedside chair)?: A Little Help needed to walk in hospital room?: A Little Help needed climbing 3-5 steps with a railing? : A Little 6 Click Score: 20    End of Session Equipment Utilized During Treatment:  Gait belt Activity Tolerance: Patient tolerated treatment well Patient left: in chair;with call bell/phone within reach   PT Visit Diagnosis: Other abnormalities of gait and mobility (R26.89);Muscle weakness (generalized) (M62.81)    Time: 8894-8854 PT Time Calculation (min) (ACUTE ONLY): 40 min   Charges:   PT Evaluation $PT Eval Moderate Complexity: 1 Mod PT Treatments $Gait Training: 8-22 mins PT General Charges $$ ACUTE PT VISIT: 1 Visit         Micheline Portal, PT Acute Rehabilitation Services Office:[phone] 07/20/2024   Montie Portal 07/20/2024, 11:50 AM

## 2024-07-20 NOTE — Assessment & Plan Note (Addendum)
 Reported dysuria, frequency on presentation UA unremarkable Urine culture with >100k colonies E faecalis Treated with ceftriaxone  -> amoxicillin  for 5 more days

## 2024-07-20 NOTE — Assessment & Plan Note (Addendum)
-  Appears to be stable at this time -Attempt to avoid nephrotoxic medications

## 2024-07-20 NOTE — Progress Notes (Signed)
 Discharge meds in a secure bag delivered to patient in room by this RN

## 2024-07-20 NOTE — Plan of Care (Signed)

## 2024-07-20 NOTE — Plan of Care (Signed)
  Problem: Education: Goal: Knowledge of General Education information will improve Description: Including pain rating scale, medication(s)/side effects and non-pharmacologic comfort measures 07/20/2024 1313 by Rushawn Capshaw C, RN Outcome: Adequate for Discharge 07/20/2024 1251 by Gordon Carolyn BROCKS, RN Outcome: Progressing   Problem: Health Behavior/Discharge Planning: Goal: Ability to manage health-related needs will improve 07/20/2024 1313 by Lindsay Straka C, RN Outcome: Adequate for Discharge 07/20/2024 1251 by Gordon Carolyn BROCKS, RN Outcome: Progressing   Problem: Clinical Measurements: Goal: Ability to maintain clinical measurements within normal limits will improve 07/20/2024 1313 by Porchia Sinkler C, RN Outcome: Adequate for Discharge 07/20/2024 1251 by Gordon Carolyn BROCKS, RN Outcome: Progressing Goal: Will remain free from infection 07/20/2024 1313 by Jean Alejos C, RN Outcome: Adequate for Discharge 07/20/2024 1251 by Guyla Bless C, RN Outcome: Progressing Goal: Diagnostic test results will improve 07/20/2024 1313 by Shanyia Stines C, RN Outcome: Adequate for Discharge 07/20/2024 1251 by Aedyn Kempfer C, RN Outcome: Progressing Goal: Respiratory complications will improve 07/20/2024 1313 by Danila Eddie C, RN Outcome: Adequate for Discharge 07/20/2024 1251 by Jantz Main C, RN Outcome: Progressing Goal: Cardiovascular complication will be avoided 07/20/2024 1313 by Congetta Odriscoll C, RN Outcome: Adequate for Discharge 07/20/2024 1251 by Astin Rape C, RN Outcome: Progressing   Problem: Activity: Goal: Risk for activity intolerance will decrease 07/20/2024 1313 by Juley Giovanetti C, RN Outcome: Adequate for Discharge 07/20/2024 1251 by Gordon Carolyn BROCKS, RN Outcome: Progressing   Problem: Nutrition: Goal: Adequate nutrition will be maintained 07/20/2024 1313 by Gustie Bobb C, RN Outcome: Adequate for Discharge 07/20/2024 1251 by Gordon Carolyn BROCKS, RN Outcome: Progressing   Problem: Coping: Goal: Level of anxiety will decrease 07/20/2024 1313 by Shahed Yeoman C, RN Outcome: Adequate for Discharge 07/20/2024 1251 by Gordon Carolyn BROCKS, RN Outcome: Progressing   Problem: Elimination: Goal: Will not experience complications related to bowel motility 07/20/2024 1313 by Chirag Krueger C, RN Outcome: Adequate for Discharge 07/20/2024 1251 by Wilford Merryfield C, RN Outcome: Progressing Goal: Will not experience complications related to urinary retention 07/20/2024 1313 by Gordon Carolyn BROCKS, RN Outcome: Adequate for Discharge 07/20/2024 1251 by Jessilynn Taft C, RN Outcome: Progressing   Problem: Pain Managment: Goal: General experience of comfort will improve and/or be controlled 07/20/2024 1313 by Luqman Perrelli C, RN Outcome: Adequate for Discharge 07/20/2024 1251 by Jhonny Calixto C, RN Outcome: Progressing   Problem: Safety: Goal: Ability to remain free from injury will improve 07/20/2024 1313 by Leoni Goodness C, RN Outcome: Adequate for Discharge 07/20/2024 1251 by Pepper Kerrick C, RN Outcome: Progressing   Problem: Skin Integrity: Goal: Risk for impaired skin integrity will decrease 07/20/2024 1313 by Aasim Restivo C, RN Outcome: Adequate for Discharge 07/20/2024 1251 by Traniya Prichett C, RN Outcome: Progressing

## 2024-07-20 NOTE — Assessment & Plan Note (Signed)
 Continue Zonisamide 

## 2024-07-20 NOTE — Discharge Summary (Signed)
 Physician Discharge Summary   Patient: Katherine Obrien MRN: [id number] DOB: 1940-11-26  Admit date:     07/18/2024  Discharge date: 07/20/24  Discharge Physician: Delon Herald   PCP: Mast, Man X, NP   Recommendations at discharge:   You are being discharged back to independent living at [facility] with home physical and occupational therapy services Complete antibiotics - Amoxicillin  twice daily for 5 more days [facility] will need to arrange for in-room meal delivery until you are independent enough to go in person [facility] will also need to ensure that your medications are delivered to your room Follow up with NP Mast next week  Discharge Diagnoses: Principal Problem:   Unresponsive episode Active Problems:   Seizure disorder (HCC)   Class 1 obesity due to excess calories with body mass index (BMI) of 34.0 to 34.9 in adult   Urinary tract infection   Chronic kidney disease, stage 3a [facility])   Hospital Course: 83yo with h/o seizure disorder, CKD stage IIIa, HTN, and HLD who presented on 11/6 with an unresponsive episode and then confusion with vomiting.  Concern for UTI, possible seizure.  Symptoms have resolved.  Urine culture pending, on Ceftriaxone .  Assessment and Plan:  Assessment & Plan Unresponsive episode Unresponsive episode PTA with subsequent AMS and emesis DDx includes seizure (known h/o), syncope, orthostasis Head CT/C-spine CT unremarkable Patient believes that this episode was related to breakthrough seizure in the setting of UTI Seizure disorder (HCC) Continue Zonisamide  Urinary tract infection Reported dysuria, frequency on presentation UA unremarkable Urine culture with >100k colonies E faecalis Treated with ceftriaxone  -> amoxicillin  for 5 more days Chronic kidney disease, stage 3a (HCC) Appears to be stable at this time Attempt to avoid nephrotoxic medications Class 1 obesity due to excess calories with body mass index (BMI) of  34.0 to 34.9 in adult Body mass index is 34.59 kg/m.SABRA  Weight loss should be encouraged Outpatient PCP/bariatric medicine f/u encouraged Significantly low or high BMI is associated with higher medical risk including morbidity and mortality      Consultants: PT OT TOC team  Procedures: None  Antibiotics: Ceftriaxone  11/6-7  Pain control - [place]  Controlled Substance Reporting System database was reviewed. and patient was instructed, not to drive, operate heavy machinery, perform activities at heights, swimming or participation in water activities or provide baby-sitting services while on Pain, Sleep and Anxiety Medications; until their outpatient Physician has advised to do so again. Also recommended to not to take more than prescribed Pain, Sleep and Anxiety Medications.   Disposition: Home ([facility]) Diet recommendation:  Regular diet DISCHARGE MEDICATION: Allergies as of 07/20/2024       Reactions   Barbiturates Other (See Comments)   A small amount overdosed the patient- affected my stability and balance (might have been given at the same as another med?)   Cefdinir Other (See Comments)   A small amount overdosed the patient- affected my stability and balance (might have been given at the same as another med?)   Codeine Other (See Comments)   Moderate, per pharmacy   Phenobarbital  Other (See Comments)   A small amount overdosed the patient- affected my stability and balance (might have been given at the same as another med?)   Tegretol  [carbamazepine ] Other (See Comments)   A small amount overdosed the patient- affected my stability and balance (might have been given at the same as another med?)   Tramadol Other (See Comments)   Moderate, per pharmacy  Medication List     STOP taking these medications    benzonatate  100 MG capsule Commonly known as: TESSALON    guaiFENesin -dextromethorphan 100-10 MG/5ML syrup Commonly known as:  ROBITUSSIN DM       TAKE these medications    amoxicillin  500 MG tablet Commonly known as: AMOXIL  Take 1 tablet (500 mg total) by mouth 2 (two) times daily for 5 days.   One-A-Day Womens 50 Plus Tabs Take 1 tablet by mouth daily with breakfast.   PreserVision AREDS 2 Caps Take 2 capsules by mouth daily.   senna-docusate 8.6-50 MG tablet Commonly known as: Senokot-S Take 1 tablet by mouth at bedtime as needed for mild constipation.   Systane 0.4-0.3 % Soln Generic drug: Polyethyl Glycol-Propyl Glycol Place 1-2 drops into both eyes 3 (three) times daily as needed (for dryness).   Vitamin D -3 25 MCG (1000 UT) Caps TAKE 1 CAPSULE DAILY WITH BREAKFAST.   zonisamide  100 MG capsule Commonly known as: ZONEGRAN  Take 1 capsule (100 mg total) by mouth at bedtime.        Discharge Exam:   Subjective: Feeling better.  Urinary symptoms are better.  Feels like she is ok to go home today.  Declined to have me call family.   Objective: Vitals:   07/19/24 2320 07/20/24 0546  BP: 136/74 (!) 140/80  Pulse: 79 89  Resp: 18 20  Temp: 98.8 F (37.1 C) 98.2 F (36.8 C)  SpO2: 95% 94%    Intake/Output Summary (Last 24 hours) at 07/20/2024 1310 Last data filed at 07/20/2024 0900 Gross per 24 hour  Intake 351.57 ml  Output 1649 ml  Net -1297.43 ml   Filed Weights   07/18/24 1551  Weight: 91.4 kg    Exam:  General:  Appears calm and comfortable and is in NAD Eyes:  normal lids, iris ENT:  grossly normal hearing, lips & tongue, mmm Cardiovascular:  RRR. No LE edema.  Respiratory:   CTA bilaterally with no wheezes/rales/rhonchi.  Normal respiratory effort. Abdomen:  soft, NT, ND Skin:  no rash or induration seen on limited exam Musculoskeletal:  grossly normal tone BUE/BLE, good ROM, no bony abnormality Psychiatric:  grossly normal mood and affect, speech fluent and appropriate, AOx3 Neurologic:  CN 2-12 grossly intact, moves all extremities in coordinated fashion  Data  Reviewed: I have reviewed the patient's lab results since admission.  Pertinent labs for today include:  WBC 11.2 UA: moderate LE, 30 protein, rare bacteria Urine culture >100k colonies E faecalis    Condition at discharge: improving  The results of significant diagnostics from this hospitalization (including imaging, microbiology, ancillary and laboratory) are listed below for reference.   Imaging Studies: CT Head Wo Contrast Result Date: 07/18/2024 CLINICAL DATA:  Unresponsive vomiting EXAM: CT HEAD WITHOUT CONTRAST CT CERVICAL SPINE WITHOUT CONTRAST TECHNIQUE: Multidetector CT imaging of the head and cervical spine was performed following the standard protocol without intravenous contrast. Multiplanar CT image reconstructions of the cervical spine were also generated. RADIATION DOSE REDUCTION: This exam was performed according to the departmental dose-optimization program which includes automated exposure control, adjustment of the mA and/or kV according to patient size and/or use of iterative reconstruction technique. COMPARISON:  CT brain 03/10/2023, CT brain and cervical spine 03/26/2019 FINDINGS: CT HEAD FINDINGS Brain: No acute territorial infarction, hemorrhage or intracranial mass. Mild atrophy and chronic small vessel ischemic changes of the white matter. Stable ventricle size Vascular: No hyperdense vessel. Carotid vascular calcification radiologist Dr. Cortez of an Skull: Normal. Negative for  fracture or focal lesion. Sinuses/Orbits: No acute finding. Other: None CT CERVICAL SPINE FINDINGS Alignment: Straightening of the cervical spine. Trace anterolisthesis C3 on C4 and C4 on C5. Facet alignment is normal Skull base and vertebrae: No acute fracture. No primary bone lesion or focal pathologic process. Soft tissues and spinal canal: No prevertebral fluid or swelling. No visible canal hematoma. Disc levels: Multilevel degenerative change. Advanced disc space narrowing C5-C6. Multilevel facet  degenerative change and ankylosis with multilevel foraminal stenosis. Posterior disc osteophyte eccentric to the right at C5-C6 results in suspected moderate canal stenosis. Upper chest: Negative. Other: None IMPRESSION: 1. No CT evidence for acute intracranial abnormality. Atrophy and chronic small vessel ischemic changes of the white matter. 2. Straightening of the cervical spine with multilevel degenerative change. No acute osseous abnormality. Electronically Signed   By: Luke Bun M.D.   On: 07/18/2024 17:20   CT Cervical Spine Wo Contrast Result Date: 07/18/2024 CLINICAL DATA:  Unresponsive vomiting EXAM: CT HEAD WITHOUT CONTRAST CT CERVICAL SPINE WITHOUT CONTRAST TECHNIQUE: Multidetector CT imaging of the head and cervical spine was performed following the standard protocol without intravenous contrast. Multiplanar CT image reconstructions of the cervical spine were also generated. RADIATION DOSE REDUCTION: This exam was performed according to the departmental dose-optimization program which includes automated exposure control, adjustment of the mA and/or kV according to patient size and/or use of iterative reconstruction technique. COMPARISON:  CT brain 03/10/2023, CT brain and cervical spine 03/26/2019 FINDINGS: CT HEAD FINDINGS Brain: No acute territorial infarction, hemorrhage or intracranial mass. Mild atrophy and chronic small vessel ischemic changes of the white matter. Stable ventricle size Vascular: No hyperdense vessel. Carotid vascular calcification radiologist Dr. Cortez of an Skull: Normal. Negative for fracture or focal lesion. Sinuses/Orbits: No acute finding. Other: None CT CERVICAL SPINE FINDINGS Alignment: Straightening of the cervical spine. Trace anterolisthesis C3 on C4 and C4 on C5. Facet alignment is normal Skull base and vertebrae: No acute fracture. No primary bone lesion or focal pathologic process. Soft tissues and spinal canal: No prevertebral fluid or swelling. No visible  canal hematoma. Disc levels: Multilevel degenerative change. Advanced disc space narrowing C5-C6. Multilevel facet degenerative change and ankylosis with multilevel foraminal stenosis. Posterior disc osteophyte eccentric to the right at C5-C6 results in suspected moderate canal stenosis. Upper chest: Negative. Other: None IMPRESSION: 1. No CT evidence for acute intracranial abnormality. Atrophy and chronic small vessel ischemic changes of the white matter. 2. Straightening of the cervical spine with multilevel degenerative change. No acute osseous abnormality. Electronically Signed   By: Luke Bun M.D.   On: 07/18/2024 17:20   DG Chest Portable 1 View Result Date: 07/18/2024 CLINICAL DATA:  Weakness. EXAM: PORTABLE CHEST 1 VIEW COMPARISON:  Chest radiograph dated 05/13/2023 FINDINGS: No focal consolidation, pleural effusion, or pneumothorax. The cardiac silhouette is within limits. Atherosclerotic calcification of the aorta. No acute osseous pathology. Degenerative changes of the spine and left shoulder. IMPRESSION: No active disease. Electronically Signed   By: Vanetta Chou M.D.   On: 07/18/2024 16:47    Microbiology: Results for orders placed or performed during the hospital encounter of 07/18/24  Resp panel by RT-PCR (RSV, Flu A&B, Covid) Anterior Nasal Swab     Status: None   Collection Time: 07/18/24  5:19 PM   Specimen: Anterior Nasal Swab  Result Value Ref Range Status   SARS Coronavirus 2 by RT PCR NEGATIVE NEGATIVE Final    Comment: (NOTE) SARS-CoV-2 target nucleic acids are NOT DETECTED.  The SARS-CoV-2  RNA is generally detectable in upper respiratory specimens during the acute phase of infection. The lowest concentration of SARS-CoV-2 viral copies this assay can detect is 138 copies/mL. A negative result does not preclude SARS-Cov-2 infection and should not be used as the sole basis for treatment or other patient management decisions. A negative result may occur with   improper specimen collection/handling, submission of specimen other than nasopharyngeal swab, presence of viral mutation(s) within the areas targeted by this assay, and inadequate number of viral copies(<138 copies/mL). A negative result must be combined with clinical observations, patient history, and epidemiological information. The expected result is Negative.  Fact Sheet for Patients:  bloggercourse.com  Fact Sheet for Healthcare Providers:  seriousbroker.it  This test is no t yet approved or cleared by the [place]  FDA and  has been authorized for detection and/or diagnosis of SARS-CoV-2 by FDA under an Emergency Use Authorization (EUA). This EUA will remain  in effect (meaning this test can be used) for the duration of the COVID-19 declaration under Section 564(b)(1) of the Act, 21 U.S.C.section 360bbb-3(b)(1), unless the authorization is terminated  or revoked sooner.       Influenza A by PCR NEGATIVE NEGATIVE Final   Influenza B by PCR NEGATIVE NEGATIVE Final    Comment: (NOTE) The Xpert Xpress SARS-CoV-2/FLU/RSV plus assay is intended as an aid in the diagnosis of influenza from Nasopharyngeal swab specimens and should not be used as a sole basis for treatment. Nasal washings and aspirates are unacceptable for Xpert Xpress SARS-CoV-2/FLU/RSV testing.  Fact Sheet for Patients: bloggercourse.com  Fact Sheet for Healthcare Providers: seriousbroker.it  This test is not yet approved or cleared by the [place]  FDA and has been authorized for detection and/or diagnosis of SARS-CoV-2 by FDA under an Emergency Use Authorization (EUA). This EUA will remain in effect (meaning this test can be used) for the duration of the COVID-19 declaration under Section 564(b)(1) of the Act, 21 U.S.C. section 360bbb-3(b)(1), unless the authorization is terminated or revoked.      Resp Syncytial Virus by PCR NEGATIVE NEGATIVE Final    Comment: (NOTE) Fact Sheet for Patients: bloggercourse.com  Fact Sheet for Healthcare Providers: seriousbroker.it  This test is not yet approved or cleared by the [place]  FDA and has been authorized for detection and/or diagnosis of SARS-CoV-2 by FDA under an Emergency Use Authorization (EUA). This EUA will remain in effect (meaning this test can be used) for the duration of the COVID-19 declaration under Section 564(b)(1) of the Act, 21 U.S.C. section 360bbb-3(b)(1), unless the authorization is terminated or revoked.  Performed at [facility], [address] [place], [place] [address]   Urine Culture     Status: Abnormal (Preliminary result)   Collection Time: 07/18/24  6:33 PM   Specimen: Urine, Clean Catch  Result Value Ref Range Status   Specimen Description   Final    URINE, CLEAN CATCH Performed at [facility], [address], [place], [place] [address]    Special Requests   Final    NONE Performed at [facility], [address] [place], [place] [address]    Culture (A)  Final    >=100,000 COLONIES/mL ENTEROCOCCUS FAECALIS SUSCEPTIBILITIES TO FOLLOW Performed at [facility], [address] [place], [place] [address]    Report Status PENDING  Incomplete    Labs: CBC: Recent Labs  Lab 07/18/24 1719 07/19/24 0422 07/20/24 0616  WBC 12.8* 17.8* 11.2*  NEUTROABS 10.2*  --   --  HGB 13.5 12.5 12.0  HCT 43.4 40.8 39.2  MCV 91.0 89.5 89.7  PLT 242 240 213   Basic Metabolic Panel: Recent Labs  Lab 07/18/24 1719 07/19/24 0422  NA 141 137  K 4.3 4.2  CL 105 102  CO2 26 24  GLUCOSE 117* 114*  BUN 31* 25*  CREATININE 1.24* 0.92  CALCIUM  9.8 9.0   Liver Function Tests: Recent Labs  Lab 07/18/24 1719  AST 36  ALT 25  ALKPHOS 125  BILITOT 0.2  PROT 7.7  ALBUMIN 3.8    CBG: No results for input(s): GLUCAP in the last 168 hours.  Discharge time spent: greater than 30 minutes.  Signed: Delon Herald, MD [facility] 07/20/2024

## 2024-07-20 NOTE — TOC Initial Note (Signed)
 Transition of Care [facility]) - Initial/Assessment Note    Patient Details  Name: Katherine Obrien MRN: [id number] Date of Birth: [birth year]  [facility]) CM/SW Contact:    Sonda Manuella Quill, RN Phone Number: 07/20/2024, 3:25 PM  Clinical Narrative:                 IP CM for d/c planning; D/C and HHPT/OT orders received; spoke w/ pt in room; pt said she lives at [facility] [place]; she plans to return at d/c; pt said facility will arrange transportation; pt identified POC son Katherine Obrien [phone]); pt verified insurance/PCP; she denied SDOH risks;; pt said she has [facility]; pt also said she receives HHPT/OT thru facility; she does not have home oxygen; LVM for Izetta Ludwig and Vina, Receptionist at facility for POC, and fax # to send orders; awaiting return call; no IP CM needs.  Expected Discharge Plan: Home w Home Health Services Barriers to Discharge: No Barriers Identified   Patient Goals and CMS Choice Patient states their goals for this hospitalization and ongoing recovery are:: home CMS Medicare.gov Compare Post Acute Care list provided to:: Patient        Expected Discharge Plan and Services   Discharge Planning Services: CM Consult Post Acute Care Choice: Home Health Living arrangements for the past 2 months: Independent Living Facility Expected Discharge Date: 07/20/24               DME Arranged: N/A DME Agency: NA       HH Arranged: NA HH Agency: NA        Prior Living Arrangements/Services Living arrangements for the past 2 months: Independent Living Facility Lives with:: Self Patient language and need for interpreter reviewed:: Yes Do you feel safe going back to the place where you live?: Yes      Need for Family Participation in Patient Care: Yes (Comment) Care giver support system in place?: Yes (comment) Current home services: DME, Home OT, Home PT ([facility]; HHPT/OT thru [facility] [place]) Criminal Activity/Legal Involvement  Pertinent to Current Situation/Hospitalization: No - Comment as needed  Activities of Daily Living      Permission Sought/Granted Permission sought to share information with : Case Manager Permission granted to share information with : Yes, Verbal Permission Granted  Share Information with NAME: Case Manager     Permission granted to share info w Relationship: Lareina Espino (son) [phone]     Emotional Assessment Appearance:: Appears stated age Attitude/Demeanor/Rapport: Gracious Affect (typically observed): Accepting Orientation: : Oriented to Self, Oriented to Place, Oriented to  Time, Oriented to Situation Alcohol  / Substance Use: Not Applicable Psych Involvement: No (comment)  Admission diagnosis:  Seizure disorder (HCC) [G40.909] Acute cystitis without hematuria [N30.00] Seizure-like activity (HCC) [R56.9] Patient Active Problem List   Diagnosis Date Noted   Acute bronchitis 03/28/2024   Osteoarthritis, multiple sites 02/29/2024   Edema 04/13/2023   Subclinical hypothyroidism 04/13/2023   Nonrheumatic tricuspid valve regurgitation 04/05/2023   Unresponsive episode 03/10/2023   Fatty liver 02/16/2023   Left breast mass 02/02/2023   Abnormal finding on CT scan 01/27/2023   Abnormal TSH 01/27/2023   HTN (hypertension) 01/27/2023   Memory deficit 11/17/2022   Prediabetes 09/29/2022   Chronic kidney disease, stage 3a (HCC) 05/12/2022   Advanced nonexudative age-related macular degeneration of left eye with subfoveal involvement 06/30/2021   Advanced nonexudative age-related macular degeneration of right eye without subfoveal involvement 01/13/2020   Serous detachment of retinal pigment epithelium of right eye  01/13/2020   Serous detachment of retinal pigment epithelium of left eye 01/13/2020   Pseudophakia, both eyes 01/13/2020   Loss of consciousness (HCC) 03/26/2019   Essential hypertension 03/26/2019   Actinic keratoses 12/20/2018   Elevated systolic blood  pressure reading without diagnosis of hypertension 11/16/2018   Urinary tract infection 10/08/2018   Adynamic ileus (HCC) 10/08/2018   Diarrhea 10/05/2018   GERD (gastroesophageal reflux disease) 10/05/2018   Urinary hesitancy 10/05/2018   Advanced care planning/counseling discussion 10/05/2018   Hyperlipidemia 09/19/2018   Gait abnormality 09/17/2018   Macular degeneration 09/17/2018   Dry eyes 09/17/2018   History of diverticulitis 09/17/2018   Seizure disorder (HCC) 09/17/2018   Hx of vertebral fracture repair 09/17/2018   Class 1 obesity due to excess calories with body mass index (BMI) of 34.0 to 34.9 in adult 09/17/2018   PCP:  Mast, Man X, NP Pharmacy:   [facility] [place], [place] - [address] [facility] [address] [place] [address] Phone: [phone] Fax: [phone]  D[facility]YLE LONG - [facility] [address] [place] [address] Phone: [phone] Fax: [phone]     Social Drivers of Health (SDOH) Social History: SDOH Screenings   Food Insecurity: No Food Insecurity (07/20/2024)  Housing: Low Risk  (07/20/2024)  Transportation Needs: No Transportation Needs (07/20/2024)  Recent Concern: Transportation Needs - Unmet Transportation Needs (07/19/2024)  Utilities: Not At Risk (07/20/2024)  Depression (PHQ2-9): Low Risk  (02/29/2024)  Social Connections: Unknown (07/19/2024)  Tobacco Use: Low Risk  (06/27/2024)   SDOH Interventions: Food Insecurity Interventions: Intervention Not Indicated, Inpatient TOC Housing Interventions: Intervention Not Indicated, Inpatient TOC Transportation Interventions: Intervention Not Indicated, Inpatient TOC Utilities Interventions: Intervention Not Indicated, Inpatient TOC   Readmission Risk Interventions    03/13/2023   11:23 AM  Readmission Risk Prevention Plan  Post Dischage Appt Complete  Medication Screening Complete  Transportation Screening Complete

## 2024-07-20 NOTE — Assessment & Plan Note (Addendum)
 Body mass index is 34.59 kg/m.SABRA  Weight loss should be encouraged Outpatient PCP/bariatric medicine f/u encouraged Significantly low or high BMI is associated with higher medical risk including morbidity and mortality

## 2024-07-20 NOTE — Assessment & Plan Note (Addendum)
 Unresponsive episode PTA with subsequent AMS and emesis DDx includes seizure (known h/o), syncope, orthostasis Head CT/C-spine CT unremarkable Patient believes that this episode was related to breakthrough seizure in the setting of UTI

## 2024-07-21 LAB — URINE CULTURE: Culture: >=100000 — AB

## 2024-07-23 ENCOUNTER — Ambulatory Visit: Payer: Self-pay | Admitting: Nurse Practitioner

## 2024-07-23 DIAGNOSIS — R296 Repeated falls: Secondary | ICD-10-CM | POA: Diagnosis not present

## 2024-07-23 DIAGNOSIS — R41841 Cognitive communication deficit: Secondary | ICD-10-CM | POA: Diagnosis not present

## 2024-07-23 DIAGNOSIS — M6281 Muscle weakness (generalized): Secondary | ICD-10-CM | POA: Diagnosis not present

## 2024-07-23 DIAGNOSIS — R2689 Other abnormalities of gait and mobility: Secondary | ICD-10-CM | POA: Diagnosis not present

## 2024-07-23 DIAGNOSIS — R2681 Unsteadiness on feet: Secondary | ICD-10-CM | POA: Diagnosis not present

## 2024-07-26 DIAGNOSIS — R296 Repeated falls: Secondary | ICD-10-CM | POA: Diagnosis not present

## 2024-07-26 DIAGNOSIS — R2681 Unsteadiness on feet: Secondary | ICD-10-CM | POA: Diagnosis not present

## 2024-07-26 DIAGNOSIS — R41841 Cognitive communication deficit: Secondary | ICD-10-CM | POA: Diagnosis not present

## 2024-07-26 DIAGNOSIS — R2689 Other abnormalities of gait and mobility: Secondary | ICD-10-CM | POA: Diagnosis not present

## 2024-07-26 DIAGNOSIS — M6281 Muscle weakness (generalized): Secondary | ICD-10-CM | POA: Diagnosis not present

## 2024-07-30 DIAGNOSIS — R2681 Unsteadiness on feet: Secondary | ICD-10-CM | POA: Diagnosis not present

## 2024-07-30 DIAGNOSIS — R41841 Cognitive communication deficit: Secondary | ICD-10-CM | POA: Diagnosis not present

## 2024-07-30 DIAGNOSIS — R2689 Other abnormalities of gait and mobility: Secondary | ICD-10-CM | POA: Diagnosis not present

## 2024-07-30 DIAGNOSIS — M6281 Muscle weakness (generalized): Secondary | ICD-10-CM | POA: Diagnosis not present

## 2024-07-30 DIAGNOSIS — R296 Repeated falls: Secondary | ICD-10-CM | POA: Diagnosis not present

## 2024-08-02 DIAGNOSIS — R41841 Cognitive communication deficit: Secondary | ICD-10-CM | POA: Diagnosis not present

## 2024-08-02 DIAGNOSIS — R2681 Unsteadiness on feet: Secondary | ICD-10-CM | POA: Diagnosis not present

## 2024-08-02 DIAGNOSIS — M6281 Muscle weakness (generalized): Secondary | ICD-10-CM | POA: Diagnosis not present

## 2024-08-02 DIAGNOSIS — R2689 Other abnormalities of gait and mobility: Secondary | ICD-10-CM | POA: Diagnosis not present

## 2024-08-02 DIAGNOSIS — R296 Repeated falls: Secondary | ICD-10-CM | POA: Diagnosis not present

## 2024-08-06 DIAGNOSIS — R296 Repeated falls: Secondary | ICD-10-CM | POA: Diagnosis not present

## 2024-08-06 DIAGNOSIS — R2689 Other abnormalities of gait and mobility: Secondary | ICD-10-CM | POA: Diagnosis not present

## 2024-08-06 DIAGNOSIS — M6281 Muscle weakness (generalized): Secondary | ICD-10-CM | POA: Diagnosis not present

## 2024-08-06 DIAGNOSIS — R41841 Cognitive communication deficit: Secondary | ICD-10-CM | POA: Diagnosis not present

## 2024-08-06 DIAGNOSIS — R2681 Unsteadiness on feet: Secondary | ICD-10-CM | POA: Diagnosis not present

## 2024-08-07 DIAGNOSIS — R296 Repeated falls: Secondary | ICD-10-CM | POA: Diagnosis not present

## 2024-08-07 DIAGNOSIS — R2689 Other abnormalities of gait and mobility: Secondary | ICD-10-CM | POA: Diagnosis not present

## 2024-08-07 DIAGNOSIS — R41841 Cognitive communication deficit: Secondary | ICD-10-CM | POA: Diagnosis not present

## 2024-08-07 DIAGNOSIS — R2681 Unsteadiness on feet: Secondary | ICD-10-CM | POA: Diagnosis not present

## 2024-08-07 DIAGNOSIS — M6281 Muscle weakness (generalized): Secondary | ICD-10-CM | POA: Diagnosis not present

## 2024-08-09 DIAGNOSIS — R296 Repeated falls: Secondary | ICD-10-CM | POA: Diagnosis not present

## 2024-08-09 DIAGNOSIS — R2689 Other abnormalities of gait and mobility: Secondary | ICD-10-CM | POA: Diagnosis not present

## 2024-08-09 DIAGNOSIS — M6281 Muscle weakness (generalized): Secondary | ICD-10-CM | POA: Diagnosis not present

## 2024-08-09 DIAGNOSIS — R41841 Cognitive communication deficit: Secondary | ICD-10-CM | POA: Diagnosis not present

## 2024-08-09 DIAGNOSIS — R2681 Unsteadiness on feet: Secondary | ICD-10-CM | POA: Diagnosis not present

## 2024-08-14 DIAGNOSIS — Z961 Presence of intraocular lens: Secondary | ICD-10-CM | POA: Diagnosis not present

## 2024-08-14 DIAGNOSIS — H04123 Dry eye syndrome of bilateral lacrimal glands: Secondary | ICD-10-CM | POA: Diagnosis not present

## 2024-08-14 DIAGNOSIS — H43811 Vitreous degeneration, right eye: Secondary | ICD-10-CM | POA: Diagnosis not present

## 2024-08-14 DIAGNOSIS — H40013 Open angle with borderline findings, low risk, bilateral: Secondary | ICD-10-CM | POA: Diagnosis not present

## 2024-08-20 DIAGNOSIS — H353113 Nonexudative age-related macular degeneration, right eye, advanced atrophic without subfoveal involvement: Secondary | ICD-10-CM | POA: Diagnosis not present

## 2024-08-20 DIAGNOSIS — H35721 Serous detachment of retinal pigment epithelium, right eye: Secondary | ICD-10-CM | POA: Diagnosis not present

## 2024-08-20 DIAGNOSIS — H35722 Serous detachment of retinal pigment epithelium, left eye: Secondary | ICD-10-CM | POA: Diagnosis not present

## 2024-08-20 DIAGNOSIS — H353124 Nonexudative age-related macular degeneration, left eye, advanced atrophic with subfoveal involvement: Secondary | ICD-10-CM | POA: Diagnosis not present

## 2024-09-19 ENCOUNTER — Other Ambulatory Visit: Payer: Self-pay

## 2024-09-19 ENCOUNTER — Emergency Department (HOSPITAL_COMMUNITY)

## 2024-09-19 ENCOUNTER — Encounter: Payer: Self-pay | Admitting: Nurse Practitioner

## 2024-09-19 ENCOUNTER — Emergency Department (HOSPITAL_COMMUNITY)
Admission: EM | Admit: 2024-09-19 | Discharge: 2024-09-19 | Disposition: A | Discharge status: Home / Self Care | Source: Skilled Nursing Facility | Attending: Emergency Medicine | Admitting: Emergency Medicine

## 2024-09-19 ENCOUNTER — Encounter (HOSPITAL_COMMUNITY): Payer: Self-pay | Admitting: Emergency Medicine

## 2024-09-19 ENCOUNTER — Non-Acute Institutional Stay: Payer: Self-pay | Admitting: Nurse Practitioner

## 2024-09-19 VITALS — BP 128/78 | HR 85 | Temp 97.7°F | Resp 19 | Ht 64.0 in | Wt 199.2 lb

## 2024-09-19 DIAGNOSIS — Y9301 Activity, walking, marching and hiking: Secondary | ICD-10-CM | POA: Insufficient documentation

## 2024-09-19 DIAGNOSIS — M546 Pain in thoracic spine: Secondary | ICD-10-CM | POA: Insufficient documentation

## 2024-09-19 DIAGNOSIS — R4189 Other symptoms and signs involving cognitive functions and awareness: Secondary | ICD-10-CM

## 2024-09-19 DIAGNOSIS — G40909 Epilepsy, unspecified, not intractable, without status epilepticus: Secondary | ICD-10-CM | POA: Diagnosis not present

## 2024-09-19 DIAGNOSIS — M545 Low back pain, unspecified: Secondary | ICD-10-CM | POA: Insufficient documentation

## 2024-09-19 DIAGNOSIS — M150 Primary generalized (osteo)arthritis: Secondary | ICD-10-CM

## 2024-09-19 DIAGNOSIS — W1809XA Striking against other object with subsequent fall, initial encounter: Secondary | ICD-10-CM | POA: Insufficient documentation

## 2024-09-19 DIAGNOSIS — S39012A Strain of muscle, fascia and tendon of lower back, initial encounter: Secondary | ICD-10-CM

## 2024-09-19 DIAGNOSIS — I10 Essential (primary) hypertension: Secondary | ICD-10-CM

## 2024-09-19 DIAGNOSIS — R531 Weakness: Secondary | ICD-10-CM | POA: Insufficient documentation

## 2024-09-19 LAB — HEPATIC FUNCTION PANEL
ALT: 14 U/L (ref 7–35)
AST: 13 (ref 13–35)
Alkaline Phosphatase: 121 (ref 25–125)
Bilirubin, Total: 0.3

## 2024-09-19 LAB — BASIC METABOLIC PANEL WITH GFR
BUN: 23 — AB (ref 4–21)
CO2: 24 — AB (ref 13–22)
Chloride: 104 (ref 99–108)
Creatinine: 0.9 (ref 0.5–1.1)
Glucose: 113
Potassium: 4 meq/L (ref 3.5–5.1)
Sodium: 139 (ref 137–147)

## 2024-09-19 LAB — CBC AND DIFFERENTIAL
HCT: 40 (ref 36–46)
Hemoglobin: 12.9 (ref 12.0–16.0)
Neutrophils Absolute: 6586
Platelets: 279 K/uL (ref 150–400)
WBC: 9.6

## 2024-09-19 LAB — COMPREHENSIVE METABOLIC PANEL WITH GFR
Albumin: 4 (ref 3.5–5.0)
Calcium: 9.2 (ref 8.7–10.7)
Globulin: 3.3

## 2024-09-19 LAB — CBC: RBC: 4.54 (ref 3.87–5.11)

## 2024-09-19 NOTE — ED Notes (Signed)
[facility] called for transport. JRPRN

## 2024-09-19 NOTE — ED Provider Notes (Signed)
 " [place] EMERGENCY DEPARTMENT AT [facility] Provider Note   CSN: [id number] Arrival date & time: 09/19/24  1657     Patient presents with: Katherine Obrien is a 84 y.o. female.   84 year old female here with mid thoracic and lower lumbar pain.  Patient states that she was walking in through the automatic door and knocked her backwards.  She did not strike her head did not lose conscious.  Does not take any blood thinners.  Complains of mid sharp and lower thoracic spinal pain.  No bowel or bladder dysfunction.  No radiation down her legs.  Pain is worse with movement and better with remaining still.  Presents via EMS       Prior to Admission medications  Medication Sig Start Date End Date Taking? Authorizing Provider  Cholecalciferol (VITAMIN D -3) 25 MCG (1000 UT) CAPS TAKE 1 CAPSULE DAILY WITH BREAKFAST. 09/24/19   Mast, Man X, NP  Multiple Vitamins-Minerals (ONE-A-DAY WOMENS 50 PLUS) TABS Take 1 tablet by mouth daily with breakfast.    [provider]  Multiple Vitamins-Minerals (PRESERVISION AREDS 2) CAPS Take 2 capsules by mouth daily.    [provider]  Polyethyl Glycol-Propyl Glycol (SYSTANE) 0.4-0.3 % SOLN Place 1-2 drops into both eyes 3 (three) times daily as needed (for dryness).     [provider]  senna-docusate (SENOKOT-S) 8.6-50 MG tablet Take 1 tablet by mouth at bedtime as needed for mild constipation. Patient not taking: Reported on 09/19/2024 07/20/24   [name], MD  zonisamide  (ZONEGRAN ) 100 MG capsule Take 1 capsule (100 mg total) by mouth at bedtime. 02/19/24 09/19/24  Camara, Amadou, MD    Allergies: Barbiturates, Cefdinir, Codeine, Phenobarbital , Tegretol  [carbamazepine ], and Tramadol    Review of Systems  All other systems reviewed and are negative.   Updated Vital Signs BP (!) 187/95 (BP Location: Right Arm)   Pulse 86   Temp 97.7 F (36.5 C) (Oral)   Resp 20   SpO2 97%   Physical Exam Vitals and  nursing note reviewed.  Constitutional:      General: She is not in acute distress.    Appearance: Normal appearance. She is well-developed. She is not toxic-appearing.  HENT:     Head: Normocephalic and atraumatic.  Eyes:     General: Lids are normal.     Conjunctiva/sclera: Conjunctivae normal.     Pupils: Pupils are equal, round, and reactive to light.  Neck:     Thyroid : No thyroid  mass.     Trachea: No tracheal deviation.  Cardiovascular:     Rate and Rhythm: Normal rate and regular rhythm.     Heart sounds: Normal heart sounds. No murmur heard.    No gallop.  Pulmonary:     Effort: Pulmonary effort is normal. No respiratory distress.     Breath sounds: Normal breath sounds. No stridor. No decreased breath sounds, wheezing, rhonchi or rales.  Abdominal:     General: There is no distension.     Palpations: Abdomen is soft.     Tenderness: There is no abdominal tenderness. There is no rebound.  Musculoskeletal:        General: No tenderness. Normal range of motion.     Cervical back: Normal range of motion and neck supple.       Back:  Skin:    General: Skin is warm and dry.     Findings: No abrasion or rash.  Neurological:     General:  No focal deficit present.     Mental Status: She is alert and oriented to person, place, and time. Mental status is at baseline.     GCS: GCS eye subscore is 4. GCS verbal subscore is 5. GCS motor subscore is 6.     Cranial Nerves: No cranial nerve deficit.     Sensory: No sensory deficit.     Motor: Motor function is intact.     Comments: Strength is 5/5 bilateral lower extremities  Psychiatric:        Attention and Perception: Attention normal.        Speech: Speech normal.        Behavior: Behavior normal.     (all labs ordered are listed, but only abnormal results are displayed) Labs Reviewed - No data to display  EKG: None  Radiology: No results found.   Procedures   Medications Ordered in the ED - No data to  display                                  Medical Decision Making Amount and/or Complexity of Data Reviewed Radiology: ordered.   X-ray of thoracic lumbar spine without acute fracture.  Patient offered medications for pain which she declined.  We discharged home     Final diagnoses:  None    ED Discharge Orders     None          Dasie Faden, MD 09/19/24 2013  "

## 2024-09-19 NOTE — Assessment & Plan Note (Signed)
 Taking Zongran, reported not sure if she takes it regularly Needs higher level of care.

## 2024-09-19 NOTE — Assessment & Plan Note (Signed)
 Progressing Needs more assistance with ADLs, higher level of care

## 2024-09-19 NOTE — ED Notes (Signed)
 Report called to [facility]. JRPRN

## 2024-09-19 NOTE — Progress Notes (Signed)
 " Location:   Clinic [facility]   Place of Service:  Clinic (12) Provider: Larwance Ossiel Marchio NP  Andres Vest X, NP  Patient Care Team: Daysi Boggan X, NP as PCP - General (Internal Medicine) Santo Stanly LABOR, MD as PCP - Cardiology (Cardiology) Lanell Starleen DASEN, MD as Referring Physician (Neurology) Roosevelt, Erla Shaker, MD as Referring Physician (Dermatology)  Extended Emergency Contact Information Primary Emergency Contact: Liew,James Address: [address]          [place], [place] [address] [place] Mobile Phone: [phone] Relation: Son Secondary Emergency Contact: Pellicane,Caroline Address: [address]          [place], [place] [address] [place] Home Phone: [phone] Work Phone: [phone] Mobile Phone: [phone] Relation: Daughter  Code Status:  DNR Goals of care: Advanced Directive information    09/19/2024    5:08 PM  Advanced Directives  Does Patient Have a Medical Advance Directive? Yes  Type of Advance Directive Healthcare Power of Attorney  Does patient want to make changes to medical advance directive? No - Patient declined  Copy of Healthcare Power of Attorney in Chart? Yes - validated most recent copy scanned in chart (See row information)     Chief Complaint  Patient presents with   Medical Management of Chronic Issues    6 Month follow up.    HPI:  Pt is a 84 y.o. female seen today for medical management of chronic diseases.    Generalized malaise, confusion, no focal neurological symptoms, afebrile, no change of appetite, denied dysuria or lower abd pain/discomfort.   Hospitalized 07/18/24-07/20/24 fpr seizures-unresponsive episode, UTI, CKD               The patient has chronic knee pain, L>R, needs four wheel  Rollator rolling walker for ambulation. Currently the patient is working with therapy for gait, strengthening, and balance.              Ambulates with walker, risk of falling, knees get weak at times.  Syncope ED  eval 05/13/23, unremarkable labs, CXR             Cardiology 04/05/23 in concern of syncope episode in May/hospitalization, on Furosemide  20mg  prn, no cardiac syncope or orthostatic hypotension,  Echo: tricuspid regurgitation, no RV dysfunction. F/u one year.              CT abd 4cm hypodense lesion left lobe of the liver, lesion from the left kidney, recommended US  02/15/24 1. No cholelithiasis or sonographic evidence for acute cholecystitis. 2. Increased hepatic parenchymal echogenicity suggestive of steatosis.             Seizure, ED eval for unresponsive 10-15 minutes, bit her tongue, off Depakote , saw neurology 03/28/23: placed on Zongran. Hospital 01/27/23-01/31/23 for seizure,EEG w/o seizure like activities, no antiepileptics recommended by Neurology,  LOC, CT head no acute process, more concerned of syncope etiology             HTN, self stopped Lisinopril .              Prediabetes Hgb A1c 6.3  Hx of hyperlipidemia, LDL 130 03/05/24,  off Atorvastatin  The patient stated she sleeps better, bathroom trip only 1x/night.  CKD Bun/creat 19/0.93 03/05/24 11/22/22 Mammogram: Further evaluation is suggested for a possible mass in the left breast, repeat Mammogram 02/16/23 showed duct ectasia, cyst.  Edema BLE, self stopped F rosemide daily Elevated TSH 4.72 03/05/24  Past Medical History:  Diagnosis Date  Arthritis    Cancer [facility])    skin   Cataract 2010   Dr. Donn, Dr.. Leverette @ [facility]   Epilepsy [facility]) 08/11/2015   Hyperlipidemia 12/08/2015   Hypertension    Hypothyroid    Polyp of colon 08/11/2015   Past Surgical History:  Procedure Laterality Date   ABDOMINAL HYSTERECTOMY  1970   APPENDECTOMY  1958   BREAST SURGERY  1984   COLON SURGERY  09/12/2006   FEMUR SURGERY  2018   Broken   TONSILLECTOMY  09/12/1944    Allergies[1]  Allergies as of 09/19/2024       Reactions   Barbiturates Other (See Comments)   A small amount overdosed the patient- affected my stability and balance (might  have been given at the same as another med?)   Cefdinir Other (See Comments)   A small amount overdosed the patient- affected my stability and balance (might have been given at the same as another med?)   Codeine Other (See Comments)   Moderate, per pharmacy   Phenobarbital  Other (See Comments)   A small amount overdosed the patient- affected my stability and balance (might have been given at the same as another med?)   Tegretol  [carbamazepine ] Other (See Comments)   A small amount overdosed the patient- affected my stability and balance (might have been given at the same as another med?)   Tramadol Other (See Comments)   Moderate, per pharmacy        Medication List        Accurate as of September 19, 2024 11:59 PM. If you have any questions, ask your nurse or doctor.          One-A-Day Womens 50 Plus Tabs Take 1 tablet by mouth daily with breakfast.   PreserVision AREDS 2 Caps Take 2 capsules by mouth daily.   Senna-S 8.6-50 MG tablet Generic drug: senna-docusate Take 1 tablet by mouth at bedtime as needed for mild constipation.   Systane 0.4-0.3 % Soln Generic drug: Polyethyl Glycol-Propyl Glycol Place 1-2 drops into both eyes 3 (three) times daily as needed (for dryness).   Vitamin D -3 25 MCG (1000 UT) Caps TAKE 1 CAPSULE DAILY WITH BREAKFAST.   zonisamide  100 MG capsule Commonly known as: ZONEGRAN  Take 1 capsule (100 mg total) by mouth at bedtime.        Review of Systems  Constitutional:  Positive for fatigue. Negative for appetite change and fever.  HENT:  Positive for hearing loss. Negative for congestion and trouble swallowing.   Eyes:  Negative for visual disturbance.  Respiratory:  Negative for cough, chest tightness, shortness of breath and wheezing.   Cardiovascular:  Positive for leg swelling.  Gastrointestinal:  Negative for abdominal pain.  Genitourinary:  Positive for frequency. Negative for dysuria and urgency.  Musculoskeletal:   Positive for arthralgias and gait problem.  Skin:  Negative for color change.  Neurological:  Negative for seizures, weakness and headaches.  Psychiatric/Behavioral:  Negative for behavioral problems and sleep disturbance. The patient is not nervous/anxious.     Immunization History  Administered Date(s) Administered   Fluad Quad(high Dose 65+) 07/03/2020, 06/28/2021, 06/28/2022   INFLUENZA, HIGH DOSE SEASONAL PF 06/26/2019   Influenza-Unspecified 06/29/2015, 05/21/2016   Moderna Covid-19 Vaccine  Bivalent Booster 39yrs & up 07/14/2022, 05/27/2023   Moderna Sars-Covid-2 Vaccination 09/16/2019, 10/14/2019, 07/21/2020, 01/21/2021, 01/28/2022   PFIZER(Purple Top)SARS-COV-2 Vaccination 06/01/2021   Pneumococcal Conjugate-13 01/19/2014   Pneumococcal Polysaccharide-23 07/28/2009   Respiratory Syncytial Virus Vaccine,Recomb Aduvanted(Arexvy) 09/09/2022   Td  07/02/1991, 02/02/1996, 09/25/1996, 07/25/2006   Tdap 12/22/2015   Unspecified SARS-COV-2 Vaccination 06/13/2024   Zoster Recombinant(Shingrix) 05/27/2017, 06/20/2017, 11/24/2017   Pertinent  Health Maintenance Due  Topic Date Due   Influenza Vaccine  Completed   Bone Density Scan  Completed      04/13/2023    1:04 PM 08/31/2023    1:30 PM 11/02/2023    3:33 PM 02/29/2024    3:19 PM 09/19/2024    1:08 PM  Fall Risk  Falls in the past year? 0 0 1 1 0  Was there an injury with Fall? 0  0  0  0  0  Fall Risk Category Calculator 0 0 2 1 0  Patient at Risk for Falls Due to No Fall Risks  History of fall(s) No Fall Risks No Fall Risks  Fall risk Follow up Falls evaluation completed  Falls evaluation completed Falls evaluation completed Falls evaluation completed     Data saved with a previous flowsheet row definition   Functional Status Survey:    Vitals:   09/19/24 1315  BP: 128/78  Pulse: 85  Resp: 19  Temp: 97.7 F (36.5 C)  SpO2: 97%  Weight: 199 lb 3.2 oz (90.4 kg)  Height: 5' 4 (1.626 m)   Body mass index is 34.19  kg/m. Physical Exam Vitals and nursing note reviewed.  Constitutional:      Appearance: Normal appearance. She is obese.  HENT:     Head: Normocephalic and atraumatic.     Nose: Nose normal.     Mouth/Throat:     Mouth: Mucous membranes are moist.  Eyes:     Extraocular Movements: Extraocular movements intact.     Conjunctiva/sclera: Conjunctivae normal.     Pupils: Pupils are equal, round, and reactive to light.  Cardiovascular:     Rate and Rhythm: Normal rate and regular rhythm.     Heart sounds: No murmur heard. Pulmonary:     Effort: Pulmonary effort is normal.     Breath sounds: Rales present.     Comments: Rales posterior lung base.  Abdominal:     General: Bowel sounds are normal.     Palpations: Abdomen is soft.     Tenderness: There is no abdominal tenderness.  Musculoskeletal:     Cervical back: Normal range of motion and neck supple.     Right lower leg: Edema present.     Left lower leg: Edema present.     Comments: Trace edema BLE R>L  Skin:    General: Skin is warm and dry.  Neurological:     General: No focal deficit present.     Mental Status: She is alert and oriented to person, place, and time. Mental status is at baseline.     Motor: No weakness.     Coordination: Coordination normal.     Gait: Gait abnormal.  Psychiatric:        Mood and Affect: Mood normal.        Behavior: Behavior normal.        Thought Content: Thought content normal.     Labs reviewed: Recent Labs    03/05/24 0709 07/18/24 1719 07/19/24 0422  NA 139 141 137  K 4.1 4.3 4.2  CL 103 105 102  CO2 26 26 24   GLUCOSE 102* 117* 114*  BUN 19 31* 25*  CREATININE 0.93 1.24* 0.92  CALCIUM  9.2 9.8 9.0   Recent Labs    03/05/24 0709 07/18/24 1719  AST 14 36  ALT 15 25  ALKPHOS  --  125  BILITOT 0.3 0.2  PROT 6.9 7.7  ALBUMIN  --  3.8   Recent Labs    03/05/24 0709 07/18/24 1719 07/19/24 0422 07/20/24 0616  WBC 10.4 12.8* 17.8* 11.2*  NEUTROABS 6,230 10.2*  --    --   HGB 12.3 13.5 12.5 12.0  HCT 39.2 43.4 40.8 39.2  MCV 88.5 91.0 89.5 89.7  PLT 301 242 240 213   Lab Results  Component Value Date   TSH 4.72 (H) 03/05/2024   Lab Results  Component Value Date   HGBA1C 6.3 (H) 03/05/2024   Lab Results  Component Value Date   CHOL 201 (H) 03/05/2024   HDL 49 (L) 03/05/2024   LDLCALC 130 (H) 03/05/2024   TRIG 112 03/05/2024   CHOLHDL 4.1 03/05/2024    Significant Diagnostic Results in last 30 days:  DG Thoracic Spine 2 View Result Date: 09/19/2024 EXAM: 2 VIEW(S) XRAY OF THE THORACIC SPINE 09/19/2024 06:50:00 PM COMPARISON: None available. CLINICAL HISTORY: Fall. FINDINGS: BONES: Vertebral body heights are maintained. Alignment is normal. DISCS AND DEGENERATIVE CHANGES: Mild degenerative changes of the lower thoracic / upper lumbar spine. SOFT TISSUES: The visualized lungs are clear. IMPRESSION: 1. No acute findings. Electronically signed by: Pinkie Pebbles MD MD 09/19/2024 07:09 PM EST RP Workstation: [id number]   DG Lumbar Spine Complete Result Date: 09/19/2024 EXAM: 4 OR MORE VIEW(S) XRAY OF THE LUMBAR SPINE 09/19/2024 06:50:00 PM COMPARISON: None available. CLINICAL HISTORY: fall FINDINGS: LUMBAR SPINE: BONES: Vertebral body heights are maintained. Alignment: Mild upper lumbar dextroscoliosis. Mild superior endplate compression fracture deformity at T12, chronic. DISCS AND DEGENERATIVE CHANGES: Mild multilevel degenerative changes, most prominent at L1-L2 and L5-S1. SOFT TISSUES: No acute abnormality. IMPRESSION: 1. No acute findings. Electronically signed by: Pinkie Pebbles MD MD 09/19/2024 07:09 PM EST RP Workstation: [id number]    Assessment/Plan  Generalized weakness Generalized malaise, confusion, no focal neurological symptoms, afebrile, no change of appetite, denied dysuria or lower abd pain/discomfort.   Hospitalized 07/18/24-07/20/24 fpr seizures-unresponsive episode, UTI, CKD   UA C/S, CBC/diff, CMP/eGFR  Osteoarthritis,  multiple sites  The patient has chronic knee pain, L>R, needs four wheel  Rollator rolling walker for ambulation. Currently the patient is working with therapy for gait, strengthening, and balance.   HTN (hypertension) Blood pressure is controlled, off Lisinorpirl   Seizure disorder (HCC) Taking Zongran, reported not sure if she takes it regularly Needs higher level of care.   Cognitive impairment Progressing Needs more assistance with ADLs, higher level of care   Family/ staff Communication: plan of care reviewed with the patient   Labs/tests ordered:  UA C/S, CBC/diff, CMP/eGFR      [1]  Allergies Allergen Reactions   Barbiturates Other (See Comments)    A small amount overdosed the patient- affected my stability and balance (might have been given at the same as another med?)   Cefdinir Other (See Comments)    A small amount overdosed the patient- affected my stability and balance (might have been given at the same as another med?)   Codeine Other (See Comments)    Moderate, per pharmacy   Phenobarbital  Other (See Comments)    A small amount overdosed the patient- affected my stability and balance (might have been given at the same as another med?)   Tegretol  [Carbamazepine ] Other (See Comments)    A small amount overdosed the patient- affected my stability and balance (might have been given at the same as another  med?)   Tramadol Other (See Comments)    Moderate, per pharmacy   "

## 2024-09-19 NOTE — Assessment & Plan Note (Signed)
 Generalized malaise, confusion, no focal neurological symptoms, afebrile, no change of appetite, denied dysuria or lower abd pain/discomfort.   Hospitalized 07/18/24-07/20/24 fpr seizures-unresponsive episode, UTI, CKD   UA C/S, CBC/diff, CMP/eGFR

## 2024-09-19 NOTE — Assessment & Plan Note (Signed)
 Blood pressure is controlled, off Lisinorpirl

## 2024-09-19 NOTE — ED Triage Notes (Signed)
 BIB EMS from [facility].  Was going through an automatic door and it knocked her back over and she fell back.  Witnessed.  Pain to lower back.  Did not hit head.  No thinners.  VSS

## 2024-09-19 NOTE — Patient Instructions (Signed)
 1.Schedule Annual Wellness Visit.

## 2024-09-19 NOTE — Assessment & Plan Note (Signed)
 The patient has chronic knee pain, L>R, needs four wheel  Rollator rolling walker for ambulation. Currently the patient is working with therapy for gait, strengthening, and balance.

## 2024-09-20 ENCOUNTER — Encounter: Payer: Self-pay | Admitting: Nurse Practitioner

## 2024-09-21 ENCOUNTER — Inpatient Hospital Stay (HOSPITAL_COMMUNITY)
Admission: EM | Admit: 2024-09-21 | Discharge: 2024-09-26 | DRG: 981 | Disposition: A | Discharge status: Skilled Nursing Facility | Source: Skilled Nursing Facility | Attending: Internal Medicine | Admitting: Internal Medicine

## 2024-09-21 ENCOUNTER — Other Ambulatory Visit: Payer: Self-pay | Admitting: Nurse Practitioner

## 2024-09-21 ENCOUNTER — Other Ambulatory Visit: Payer: Self-pay

## 2024-09-21 ENCOUNTER — Emergency Department (HOSPITAL_COMMUNITY)

## 2024-09-21 DIAGNOSIS — Z90710 Acquired absence of both cervix and uterus: Secondary | ICD-10-CM

## 2024-09-21 DIAGNOSIS — G40909 Epilepsy, unspecified, not intractable, without status epilepticus: Secondary | ICD-10-CM | POA: Diagnosis present

## 2024-09-21 DIAGNOSIS — E785 Hyperlipidemia, unspecified: Secondary | ICD-10-CM | POA: Diagnosis present

## 2024-09-21 DIAGNOSIS — R5381 Other malaise: Secondary | ICD-10-CM | POA: Diagnosis present

## 2024-09-21 DIAGNOSIS — Z8262 Family history of osteoporosis: Secondary | ICD-10-CM

## 2024-09-21 DIAGNOSIS — Z79899 Other long term (current) drug therapy: Secondary | ICD-10-CM

## 2024-09-21 DIAGNOSIS — E669 Obesity, unspecified: Secondary | ICD-10-CM | POA: Diagnosis present

## 2024-09-21 DIAGNOSIS — N3000 Acute cystitis without hematuria: Secondary | ICD-10-CM

## 2024-09-21 DIAGNOSIS — N390 Urinary tract infection, site not specified: Secondary | ICD-10-CM

## 2024-09-21 DIAGNOSIS — Y9301 Activity, walking, marching and hiking: Secondary | ICD-10-CM | POA: Diagnosis present

## 2024-09-21 DIAGNOSIS — H53462 Homonymous bilateral field defects, left side: Secondary | ICD-10-CM | POA: Diagnosis present

## 2024-09-21 DIAGNOSIS — Z7982 Long term (current) use of aspirin: Secondary | ICD-10-CM

## 2024-09-21 DIAGNOSIS — W1839XA Other fall on same level, initial encounter: Secondary | ICD-10-CM | POA: Diagnosis present

## 2024-09-21 DIAGNOSIS — M545 Low back pain, unspecified: Secondary | ICD-10-CM | POA: Diagnosis present

## 2024-09-21 DIAGNOSIS — Z85828 Personal history of other malignant neoplasm of skin: Secondary | ICD-10-CM

## 2024-09-21 DIAGNOSIS — I10 Essential (primary) hypertension: Secondary | ICD-10-CM | POA: Diagnosis present

## 2024-09-21 DIAGNOSIS — Z860100 Personal history of colon polyps, unspecified: Secondary | ICD-10-CM

## 2024-09-21 DIAGNOSIS — T1490XA Injury, unspecified, initial encounter: Principal | ICD-10-CM

## 2024-09-21 DIAGNOSIS — Z6834 Body mass index (BMI) 34.0-34.9, adult: Secondary | ICD-10-CM

## 2024-09-21 DIAGNOSIS — F32A Depression, unspecified: Secondary | ICD-10-CM | POA: Diagnosis present

## 2024-09-21 DIAGNOSIS — Z823 Family history of stroke: Secondary | ICD-10-CM

## 2024-09-21 DIAGNOSIS — Z803 Family history of malignant neoplasm of breast: Secondary | ICD-10-CM

## 2024-09-21 DIAGNOSIS — R4182 Altered mental status, unspecified: Secondary | ICD-10-CM

## 2024-09-21 DIAGNOSIS — R471 Dysarthria and anarthria: Secondary | ICD-10-CM | POA: Diagnosis present

## 2024-09-21 DIAGNOSIS — M4324 Fusion of spine, thoracic region: Secondary | ICD-10-CM | POA: Diagnosis present

## 2024-09-21 DIAGNOSIS — S22079A Unspecified fracture of T9-T10 vertebra, initial encounter for closed fracture: Secondary | ICD-10-CM

## 2024-09-21 DIAGNOSIS — Z881 Allergy status to other antibiotic agents status: Secondary | ICD-10-CM

## 2024-09-21 DIAGNOSIS — I63531 Cerebral infarction due to unspecified occlusion or stenosis of right posterior cerebral artery: Principal | ICD-10-CM | POA: Diagnosis present

## 2024-09-21 DIAGNOSIS — Y92128 Other place in nursing home as the place of occurrence of the external cause: Secondary | ICD-10-CM

## 2024-09-21 DIAGNOSIS — G9341 Metabolic encephalopathy: Secondary | ICD-10-CM | POA: Diagnosis present

## 2024-09-21 DIAGNOSIS — W208XXA Other cause of strike by thrown, projected or falling object, initial encounter: Secondary | ICD-10-CM | POA: Diagnosis present

## 2024-09-21 DIAGNOSIS — E039 Hypothyroidism, unspecified: Secondary | ICD-10-CM | POA: Diagnosis present

## 2024-09-21 DIAGNOSIS — Z8261 Family history of arthritis: Secondary | ICD-10-CM

## 2024-09-21 DIAGNOSIS — R29703 NIHSS score 3: Secondary | ICD-10-CM | POA: Diagnosis present

## 2024-09-21 DIAGNOSIS — Z833 Family history of diabetes mellitus: Secondary | ICD-10-CM

## 2024-09-21 DIAGNOSIS — Z888 Allergy status to other drugs, medicaments and biological substances status: Secondary | ICD-10-CM

## 2024-09-21 DIAGNOSIS — Z885 Allergy status to narcotic agent status: Secondary | ICD-10-CM

## 2024-09-21 DIAGNOSIS — W19XXXA Unspecified fall, initial encounter: Secondary | ICD-10-CM

## 2024-09-21 DIAGNOSIS — Z820 Family history of epilepsy and other diseases of the nervous system: Secondary | ICD-10-CM

## 2024-09-21 DIAGNOSIS — S22088A Other fracture of T11-T12 vertebra, initial encounter for closed fracture: Secondary | ICD-10-CM | POA: Diagnosis present

## 2024-09-21 DIAGNOSIS — Z8249 Family history of ischemic heart disease and other diseases of the circulatory system: Secondary | ICD-10-CM

## 2024-09-21 LAB — URINALYSIS, ROUTINE W REFLEX MICROSCOPIC
Bilirubin Urine: NEGATIVE
Glucose, UA: NEGATIVE mg/dL
Ketones, ur: NEGATIVE mg/dL
Nitrite: NEGATIVE
Protein, ur: 30 mg/dL — AB
Specific Gravity, Urine: 1.017 (ref 1.005–1.030)
WBC, UA: >50 WBC/hpf (ref 0–5)
pH: 6 (ref 5.0–8.0)

## 2024-09-21 LAB — CBC WITH DIFFERENTIAL/PLATELET
Abs Immature Granulocytes: 0.07 K/uL (ref 0.00–0.07)
Basophils Absolute: 0.1 K/uL (ref 0.0–0.1)
Basophils Relative: 1 %
Eosinophils Absolute: 0.5 K/uL (ref 0.0–0.5)
Eosinophils Relative: 4 %
HCT: 41.2 % (ref 36.0–46.0)
Hemoglobin: 13.4 g/dL (ref 12.0–15.0)
Immature Granulocytes: 1 %
Lymphocytes Relative: 19 %
Lymphs Abs: 2.3 K/uL (ref 0.7–4.0)
MCH: 28.9 pg (ref 26.0–34.0)
MCHC: 32.5 g/dL (ref 30.0–36.0)
MCV: 89 fL (ref 80.0–100.0)
Monocytes Absolute: 1.2 K/uL — ABNORMAL HIGH (ref 0.1–1.0)
Monocytes Relative: 10 %
Neutro Abs: 8.2 K/uL — ABNORMAL HIGH (ref 1.7–7.7)
Neutrophils Relative %: 65 %
Platelets: 215 K/uL (ref 150–400)
RBC: 4.63 MIL/uL (ref 3.87–5.11)
RDW: 14.9 % (ref 11.5–15.5)
WBC: 12.3 K/uL — ABNORMAL HIGH (ref 4.0–10.5)
nRBC: 0 % (ref 0.0–0.2)

## 2024-09-21 LAB — COMPREHENSIVE METABOLIC PANEL WITH GFR
ALT: 14 U/L (ref 0–44)
AST: 21 U/L (ref 15–41)
Albumin: 3.6 g/dL (ref 3.5–5.0)
Alkaline Phosphatase: 115 U/L (ref 38–126)
Anion gap: 10 (ref 5–15)
BUN: 24 mg/dL — ABNORMAL HIGH (ref 8–23)
CO2: 26 mmol/L (ref 22–32)
Calcium: 9.1 mg/dL (ref 8.9–10.3)
Chloride: 104 mmol/L (ref 98–111)
Creatinine, Ser: 1 mg/dL (ref 0.44–1.00)
GFR, Estimated: 55 mL/min — ABNORMAL LOW
Glucose, Bld: 105 mg/dL — ABNORMAL HIGH (ref 70–99)
Potassium: 3.7 mmol/L (ref 3.5–5.1)
Sodium: 139 mmol/L (ref 135–145)
Total Bilirubin: 0.4 mg/dL (ref 0.0–1.2)
Total Protein: 7.3 g/dL (ref 6.5–8.1)

## 2024-09-21 MED ORDER — NITROFURANTOIN MONOHYD MACRO 100 MG PO CAPS: 100.0000 mg | ORAL_CAPSULE | Freq: Two times a day (BID) | ORAL | 0 refills | Status: DC

## 2024-09-21 MED ORDER — ACETAMINOPHEN 325 MG PO TABS
650.0000 mg | ORAL_TABLET | Freq: Once | ORAL | Status: AC
  Administered 2024-09-22: 650 mg via ORAL
  Filled 2024-09-21: qty 2

## 2024-09-21 NOTE — ED Provider Notes (Incomplete)
 " [place] [facility] AT [facility] Provider Note   CSN: [id number] Arrival date & time: 09/21/24  2021     Patient presents with: No chief complaint on file.   Katherine Obrien is a 84 y.o. female.  {Add pertinent medical, surgical, social history, OB history to HPI:6423} 84 year old female with a past medical history of hypertension, epilepsy presents to the ED with a chief complaint of back pain.  According to daughter who is providing most of the history patient was in the emergency department 2 days ago status post fall.  Patient had plain films of her back and she had no improvement in her symptoms despite being home.  Her daughter who currently flew in from the [place] reports that she had hired 24-hour care however patient has not had any oral intake over the last 2 days, has not left her bed, has refused to ambulate due to pain.  She reports she requires significant assistance.  Patient currently was living in independent and are trying to transfer her to a full assisted living.  Daughter also tells me that she is prone to urinary tract infections.  Patient is unable to really voice what is hurting her today.  She seems quite upset that she does not have supper.  She did not strike her head when she fell, she does not have any chest pain, abdominal pain or other complaints.   The history is provided by the patient.       Prior to Admission medications  Medication Sig Start Date End Date Taking? Authorizing Provider  Cholecalciferol (VITAMIN D -3) 25 MCG (1000 UT) CAPS TAKE 1 CAPSULE DAILY WITH BREAKFAST. 09/24/19   Mast, Man X, NP  Multiple Vitamins-Minerals (ONE-A-DAY WOMENS 50 PLUS) TABS Take 1 tablet by mouth daily with breakfast.    [provider]  Multiple Vitamins-Minerals (PRESERVISION AREDS 2) CAPS Take 2 capsules by mouth daily.    [provider]  nitrofurantoin , macrocrystal-monohydrate, (MACROBID ) 100 MG capsule Take 1  capsule (100 mg total) by mouth 2 (two) times daily for 7 days. 09/21/24 09/28/24  Mast, Man X, NP  Polyethyl Glycol-Propyl Glycol (SYSTANE) 0.4-0.3 % SOLN Place 1-2 drops into both eyes 3 (three) times daily as needed (for dryness).     [provider]  senna-docusate (SENOKOT-S) 8.6-50 MG tablet Take 1 tablet by mouth at bedtime as needed for mild constipation. Patient not taking: Reported on 09/19/2024 07/20/24   [name], MD  zonisamide  (ZONEGRAN ) 100 MG capsule Take 1 capsule (100 mg total) by mouth at bedtime. 02/19/24 09/19/24  Camara, Amadou, MD    Allergies: Barbiturates, Cefdinir, Codeine, Phenobarbital , Tegretol  [carbamazepine ], and Tramadol    Review of Systems  Constitutional:  Negative for fever.  Respiratory:  Negative for shortness of breath.   Cardiovascular:  Negative for chest pain.  Gastrointestinal:  Negative for abdominal pain, diarrhea and vomiting.  Musculoskeletal:  Positive for back pain and myalgias.  All other systems reviewed and are negative.   Updated Vital Signs BP (!) 168/76   Pulse 83   Temp 98.1 F (36.7 C) (Oral)   Resp 17   SpO2 93%   Physical Exam Vitals and nursing note reviewed.  HENT:     Head: Normocephalic and atraumatic.     Nose: Nose normal.  Eyes:     Pupils: Pupils are equal, round, and reactive to light.  Cardiovascular:     Rate and Rhythm: Normal rate.  Pulmonary:  Effort: Pulmonary effort is normal.  Abdominal:     General: Abdomen is flat.  Musculoskeletal:        General: Signs of injury present.     Cervical back: Normal range of motion and neck supple.     Lumbar back: Tenderness present.     Comments: Limited ROM to BLLE, sensation is intact.   Skin:    General: Skin is warm and dry.  Neurological:     Mental Status: She is alert and oriented to person, place, and time.     (all labs ordered are listed, but only abnormal results are displayed) Labs Reviewed  CBC WITH DIFFERENTIAL/PLATELET -  Abnormal; Notable for the following components:      Result Value   WBC 12.3 (*)    Neutro Abs 8.2 (*)    Monocytes Absolute 1.2 (*)    All other components within normal limits  COMPREHENSIVE METABOLIC PANEL WITH GFR - Abnormal; Notable for the following components:   Glucose, Bld 105 (*)    BUN 24 (*)    GFR, Estimated 55 (*)    All other components within normal limits  URINALYSIS, ROUTINE W REFLEX MICROSCOPIC - Abnormal; Notable for the following components:   APPearance HAZY (*)    Hgb urine dipstick SMALL (*)    Protein, ur 30 (*)    Leukocytes,Ua LARGE (*)    Bacteria, UA FEW (*)    All other components within normal limits  URINE CULTURE    EKG: None  Radiology: CT CERVICAL SPINE WO CONTRAST Result Date: 09/21/2024 EXAM: CT CERVICAL SPINE WITHOUT CONTRAST 09/21/2024 11:27:03 PM TECHNIQUE: CT of the cervical spine was performed without the administration of intravenous contrast. Multiplanar reformatted images are provided for review. Automated exposure control, iterative reconstruction, and/or weight based adjustment of the mA/kV was utilized to reduce the radiation dose to as low as reasonably achievable. COMPARISON: None available. CLINICAL HISTORY: fall FINDINGS: BONES AND ALIGNMENT: No acute fracture or traumatic malalignment. DEGENERATIVE CHANGES: Degenerative disc disease is greatest at C5-C6, where there is endplate sclerosis and uncovertebral spurring. Lytic atherosclerosis of the facet. No high-grade cervical spinal canal stenosis. SOFT TISSUES: No prevertebral soft tissue swelling. IMPRESSION: 1. Degenerative disc disease greatest at C5-C6 with endplate sclerosis and uncovertebral spurring. 2. No high-grade cervical spinal canal stenosis. Electronically signed by: Franky Stanford MD MD 09/21/2024 11:45 PM EST RP Workstation: [id number]   CT Thoracic Spine Wo Contrast Result Date: 09/21/2024 EXAM: CT THORACIC SPINE WITHOUT CONTRAST 09/21/2024 11:27:03 PM TECHNIQUE: CT of the  thoracic spine was performed without the administration of intravenous contrast. Multiplanar reformatted images are provided for review. Automated exposure control, iterative reconstruction, and/or weight based adjustment of the mA/kV was utilized to reduce the radiation dose to as low as reasonably achievable. COMPARISON: None available. CLINICAL HISTORY: Mid-back pain. FINDINGS: BONES AND ALIGNMENT: Vertebral body heights are maintained except for the superior T11 endplate, which is fractured and uplifted. At T10-T11, there is evidence of acute hyperextension injury with anterior widening of the T10-T11 disc space. There is no visible osseous extension of the fracture into the posterior elements. According to the AO Spine classification of thoracolumbar injuries, the finding is consistent with a T10-T11: B3 hyperextension distraction injury (with associated anterior endplate fracture at T11) and the recommendation is not specified by this descriptive classification alone. No suspicious bone lesion. DEGENERATIVE CHANGES: There are flowing anterior syndesmophytes along the length of the thoracic spine. SOFT TISSUES: No acute abnormality. IMPRESSION: 1. Acute hyperextension injury  at T10-T11 with fracture and uplifting of the superior T11 endplate and anterior widening of the T10-11 disc space, consistent with a T10-T11 B3 hyperextension distraction injury per AO Spine classification. 2. Thoracic ankylosis Electronically signed by: Franky Stanford MD MD 09/21/2024 11:40 PM EST RP Workstation: [id number]   CT Lumbar Spine Wo Contrast Result Date: 09/21/2024 EXAM: CT OF THE LUMBAR SPINE WITHOUT CONTRAST 09/21/2024 11:27:03 PM TECHNIQUE: CT of the lumbar spine was performed without the administration of intravenous contrast. Multiplanar reformatted images are provided for review. Automated exposure control, iterative reconstruction, and/or weight based adjustment of the mA/kV was utilized to reduce the radiation dose to  as low as reasonably achievable. COMPARISON: None available. CLINICAL HISTORY: Low back pain, trauma. FINDINGS: BONES AND ALIGNMENT: Normal vertebral body heights. No acute fracture or suspicious bone lesion. Grade 1 retrolisthesis at L2-L3 and L3-L4. Lumbar levoscoliosis with apex at L4. DEGENERATIVE CHANGES: Multilevel degenerative disc disease with disc space narrowing and mild endplate remodeling. Multilevel severe lower lumbar facet arthrosis. Moderate spinal canal stenosis at L3-L4, L4-L5, and L5-S1. Moderate bilateral L5 neural foraminal stenosis. SOFT TISSUES: Calcific aortic sclerosis. Cholelithiasis. No acute abnormality. IMPRESSION: 1. No evidence of acute traumatic injury. 2. Moderate spinal canal stenosis at L3-L4, L4-L5, and L5-S1. 3. Moderate bilateral L5 neural foraminal stenosis. Electronically signed by: Franky Stanford MD MD 09/21/2024 11:35 PM EST RP Workstation: [id number]   CT HEAD WO CONTRAST ( ) Result Date: 09/21/2024 EXAM: CT HEAD WITHOUT CONTRAST 09/21/2024 11:27:03 PM TECHNIQUE: CT of the head was performed without the administration of intravenous contrast. Automated exposure control, iterative reconstruction, and/or weight based adjustment of the mA/kV was utilized to reduce the radiation dose to as low as reasonably achievable. COMPARISON: 07/18/2024 CLINICAL HISTORY: Delirium FINDINGS: BRAIN AND VENTRICLES: No acute hemorrhage. No evidence of acute infarct. Since the prior study, encephalomalacia has developed in the right PCA territory. This appears late subacute. MRI would be helpful for better temporal characterization. No hydrocephalus. No extra-axial collection. No mass effect or midline shift. ORBITS: No acute abnormality. SINUSES: No acute abnormality. SOFT TISSUES AND SKULL: No acute soft tissue abnormality. No skull fracture. IMPRESSION: 1. 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 2. MRI would be helpful for  more precise temporal characterization. Electronically signed by: Franky Stanford MD MD 09/21/2024 11:32 PM EST RP Workstation: [id number]    {Document cardiac monitor, telemetry assessment procedure when appropriate:32947} Procedures   Medications Ordered in the ED  acetaminophen  (TYLENOL ) tablet 650 mg (has no administration in time range)    Clinical Course as of 09/22/24 0002  Sat Sep 21, 2024  2346 Leukocytes,Ua(!): LARGE [JS]  2346 WBC, UA: >50 [JS]  2346 Bacteria, UA(!): FEW [JS]    Clinical Course User Index [JS] Kyren Vaux, PA-C   {Click here for ABCD2, HEART and other calculators REFRESH Note before signing:1}                              Medical Decision Making Amount and/or Complexity of Data Reviewed Labs: ordered. Decision-making details documented in ED Course. Radiology: ordered.   This patient presents to the ED for concern of back pain,change in mental status this involves a number of treatment options, and is a complaint that carries with it a high risk of complications and morbidity.  The differential diagnosis includes acute fracture, infection versus intracranial pathology.    Co morbidities: Discussed in HPI  Brief History:  See HPI.   EMR reviewed including pt PMHx, past surgical history and past visits to ER.   See HPI for more details   Lab Tests:  I ordered and independently interpreted labs.  The pertinent results include:    CBC with a leukocytosis 12.3.  Hemoglobin is at her baseline.  CMP with no electrolyte derangement, BUN is slightly elevated, according to her daughter she has not had any oral intake in the last 48 hours.  Creatinine levels within normal limits.  LFTs are unremarkable.  Urinalysis with large leukocytes, greater than 50 white blood cell count along with few bacteria, the specimen was collected from a bedpan, high concern for infection as patient's mental status has changed over the last 48 hours.  Imaging  Studies:  CT Lumbar spine showed: 1. No evidence of acute traumatic injury.  2. Moderate spinal canal stenosis at L3-L4, L4-L5, and L5-S1.  3. Moderate bilateral L5 neural foraminal stenosis.   CT thoracic spine showed: IMPRESSION:  1. Acute hyperextension injury at T10-T11 with fracture and uplifting of the  superior T11 endplate and anterior widening of the T10-11 disc space,  consistent with a T10-T11 B3 hyperextension distraction injury per AO Spine  classification.  2. Thoracic ankylosis   Medicines ordered:  I ordered medication including tylenol   for pain control Reevaluation of the patient after these medicines showed that the patient stayed the same I have reviewed the patients home medicines and have made adjustments as needed  Critical Interventions:  ***  Reevaluation:  After the interventions noted above I re-evaluated patient and found that they have :stayed the same  Social Determinants of Health:  The patient's social determinants of health were a factor in the care of this patient  Problem List / ED Course:  ***   Dispostion:  After consideration of the diagnostic results and the patients response to treatment, I feel that the patent would benefit from ***    Portions of this note were generated with Dragon dictation software. Dictation errors may occur despite best attempts at proofreading.   Final diagnoses:  Fall, initial encounter  Closed fracture of tenth thoracic vertebra, unspecified fracture morphology, initial encounter [facility])  Acute cystitis without hematuria    ED Discharge Orders     None        "

## 2024-09-21 NOTE — ED Provider Notes (Signed)
 " [place] EMERGENCY DEPARTMENT AT [facility] Provider Note   CSN: [id number] Arrival date & time: 09/21/24  2021     Patient presents with: No chief complaint on file.   Katherine Obrien is a 84 y.o. female.   84 year old female with a past medical history of hypertension, epilepsy presents to the ED with a chief complaint of back pain.  According to daughter who is providing most of the history patient was in the emergency department 2 days ago status post fall.  Patient had plain films of her back and she had no improvement in her symptoms despite being home.  Her daughter who currently flew in from the [place] reports that she had hired 24-hour care however patient has not had any oral intake over the last 2 days, has not left her bed, has refused to ambulate due to pain.  She reports she requires significant assistance.  Patient currently was living in independent and are trying to transfer her to a full assisted living.  Daughter also tells me that she is prone to urinary tract infections.  Patient is unable to really voice what is hurting her today.  She seems quite upset that she does not have supper.  She did not strike her head when she fell, she does not have any chest pain, abdominal pain or other complaints.   The history is provided by the patient.       Prior to Admission medications  Medication Sig Start Date End Date Taking? Authorizing Provider  Cholecalciferol (VITAMIN D -3) 25 MCG (1000 UT) CAPS TAKE 1 CAPSULE DAILY WITH BREAKFAST. Patient taking differently: Take 1,000 Units by mouth daily with breakfast. 09/24/19  Yes Mast, Man X, NP  Multiple Vitamins-Minerals (CENTRUM SILVER 50+WOMEN) TABS Take 1 tablet by mouth daily with breakfast.   Yes [provider]  Multiple Vitamins-Minerals (PRESERVISION AREDS 2) CAPS Take 1 capsule by mouth 2 (two) times daily.   Yes [provider]  OVER THE COUNTER MEDICATION Place 1 drop into both eyes  See admin instructions. [employer] Advanced Eye Relief Dry Eye Rejuvenation Lubricant Eye Drops - Instill 1 drop into both eyes three times a day as needed for dryness   Yes [provider]  zonisamide  (ZONEGRAN ) 100 MG capsule Take 1 capsule (100 mg total) by mouth at bedtime. 02/19/24 09/22/24 Yes Camara, Pastor, MD  nitrofurantoin , macrocrystal-monohydrate, (MACROBID ) 100 MG capsule Take 1 capsule (100 mg total) by mouth 2 (two) times daily for 7 days. Patient not taking: Reported on 09/22/2024 09/21/24 09/28/24  Mast, Man X, NP  senna-docusate (SENOKOT-S) 8.6-50 MG tablet Take 1 tablet by mouth at bedtime as needed for mild constipation. 07/20/24   [name], MD    Allergies: Barbiturates, Cefdinir, Codeine, Phenobarbital , Tegretol  [carbamazepine ], and Tramadol    Review of Systems  Constitutional:  Negative for fever.  Respiratory:  Negative for shortness of breath.   Cardiovascular:  Negative for chest pain.  Gastrointestinal:  Negative for abdominal pain, diarrhea and vomiting.  Musculoskeletal:  Positive for back pain and myalgias.  All other systems reviewed and are negative.   Updated Vital Signs BP (!) 161/89 (BP Location: Right Arm)   Pulse 84   Temp 97.6 F (36.4 C) (Oral)   Resp 16   Ht 5' 4.02 (1.626 m)   Wt 89.9 kg   SpO2 93%   BMI 34.00 kg/m   Physical Exam Vitals and nursing note reviewed.  HENT:  Head: Normocephalic and atraumatic.     Nose: Nose normal.  Eyes:     Pupils: Pupils are equal, round, and reactive to light.  Cardiovascular:     Rate and Rhythm: Normal rate.  Pulmonary:     Effort: Pulmonary effort is normal.  Abdominal:     General: Abdomen is flat.  Musculoskeletal:        General: Signs of injury present.     Cervical back: Normal range of motion and neck supple.     Lumbar back: Tenderness present.     Comments: Limited ROM to BLLE, sensation is intact.   Skin:    General: Skin is warm and dry.  Neurological:      Mental Status: She is alert and oriented to person, place, and time.     (all labs ordered are listed, but only abnormal results are displayed) Labs Reviewed  CBC WITH DIFFERENTIAL/PLATELET - Abnormal; Notable for the following components:      Result Value   WBC 12.3 (*)    Neutro Abs 8.2 (*)    Monocytes Absolute 1.2 (*)    All other components within normal limits  COMPREHENSIVE METABOLIC PANEL WITH GFR - Abnormal; Notable for the following components:   Glucose, Bld 105 (*)    BUN 24 (*)    GFR, Estimated 55 (*)    All other components within normal limits  URINALYSIS, ROUTINE W REFLEX MICROSCOPIC - Abnormal; Notable for the following components:   APPearance HAZY (*)    Hgb urine dipstick SMALL (*)    Protein, ur 30 (*)    Leukocytes,Ua LARGE (*)    Bacteria, UA FEW (*)    All other components within normal limits  CBC - Abnormal; Notable for the following components:   WBC 11.0 (*)    All other components within normal limits  BASIC METABOLIC PANEL WITH GFR - Abnormal; Notable for the following components:   Glucose, Bld 120 (*)    Creatinine, Ser 1.17 (*)    GFR, Estimated 46 (*)    All other components within normal limits  GLUCOSE, CAPILLARY - Abnormal; Notable for the following components:   Glucose-Capillary 109 (*)    All other components within normal limits  URINE CULTURE  HEMOGLOBIN A1C  LIPID PANEL    EKG: None  Radiology: MR BRAIN WO CONTRAST Result Date: 09/22/2024 EXAM: MRI BRAIN WITHOUT CONTRAST 09/22/2024 11:18:41 AM TECHNIQUE: Multiplanar multisequence MRI of the head/brain was performed without the administration of intravenous contrast. COMPARISON: Head CT 09/21/2024. CLINICAL HISTORY: Stroke. FINDINGS: BRAIN AND VENTRICLES: A large subacute right PCA infarct is again seen with associated restricted diffusion, cytotoxic edema, cortical laminar necrosis, and likely minimal petechial blood products. No mass, midline shift, extra-axial fluid  collection, or hydrocephalus is evident. T2 hyperintensities in the cerebral white matter bilaterally are nonspecific but compatible with mild chronic small vessel ischemic disease. There is mild generalized cerebral atrophy. Major intracranial vascular flow voids are preserved. ORBITS: Bilateral cataract extraction. SINUSES AND MASTOIDS: Minimal mucosal thickening in the paranasal sinuses. Clear mastoid air cells. BONES AND SOFT TISSUES: Normal marrow signal. No significant soft tissue abnormality. IMPRESSION: 1. Large subacute right PCA infarct. 2. Mild chronic small vessel ischemic disease. Electronically signed by: Dasie Hamburg MD MD 09/22/2024 11:49 AM EST RP Workstation: [id number]   CT CERVICAL SPINE WO CONTRAST Result Date: 09/21/2024 EXAM: CT CERVICAL SPINE WITHOUT CONTRAST 09/21/2024 11:27:03 PM TECHNIQUE: CT of the cervical spine was performed without the administration of intravenous contrast.  Multiplanar reformatted images are provided for review. Automated exposure control, iterative reconstruction, and/or weight based adjustment of the mA/kV was utilized to reduce the radiation dose to as low as reasonably achievable. COMPARISON: None available. CLINICAL HISTORY: fall FINDINGS: BONES AND ALIGNMENT: No acute fracture or traumatic malalignment. DEGENERATIVE CHANGES: Degenerative disc disease is greatest at C5-C6, where there is endplate sclerosis and uncovertebral spurring. Lytic atherosclerosis of the facet. No high-grade cervical spinal canal stenosis. SOFT TISSUES: No prevertebral soft tissue swelling. IMPRESSION: 1. Degenerative disc disease greatest at C5-C6 with endplate sclerosis and uncovertebral spurring. 2. No high-grade cervical spinal canal stenosis. Electronically signed by: Franky Stanford MD MD 09/21/2024 11:45 PM EST RP Workstation: [id number]   CT Thoracic Spine Wo Contrast Result Date: 09/21/2024 EXAM: CT THORACIC SPINE WITHOUT CONTRAST 09/21/2024 11:27:03 PM TECHNIQUE: CT of the  thoracic spine was performed without the administration of intravenous contrast. Multiplanar reformatted images are provided for review. Automated exposure control, iterative reconstruction, and/or weight based adjustment of the mA/kV was utilized to reduce the radiation dose to as low as reasonably achievable. COMPARISON: None available. CLINICAL HISTORY: Mid-back pain. FINDINGS: BONES AND ALIGNMENT: Vertebral body heights are maintained except for the superior T11 endplate, which is fractured and uplifted. At T10-T11, there is evidence of acute hyperextension injury with anterior widening of the T10-T11 disc space. There is no visible osseous extension of the fracture into the posterior elements. According to the AO Spine classification of thoracolumbar injuries, the finding is consistent with a T10-T11: B3 hyperextension distraction injury (with associated anterior endplate fracture at T11) and the recommendation is not specified by this descriptive classification alone. No suspicious bone lesion. DEGENERATIVE CHANGES: There are flowing anterior syndesmophytes along the length of the thoracic spine. SOFT TISSUES: No acute abnormality. IMPRESSION: 1. Acute hyperextension injury at T10-T11 with fracture and uplifting of the superior T11 endplate and anterior widening of the T10-11 disc space, consistent with a T10-T11 B3 hyperextension distraction injury per AO Spine classification. 2. Thoracic ankylosis Electronically signed by: Franky Stanford MD MD 09/21/2024 11:40 PM EST RP Workstation: [id number]   CT Lumbar Spine Wo Contrast Result Date: 09/21/2024 EXAM: CT OF THE LUMBAR SPINE WITHOUT CONTRAST 09/21/2024 11:27:03 PM TECHNIQUE: CT of the lumbar spine was performed without the administration of intravenous contrast. Multiplanar reformatted images are provided for review. Automated exposure control, iterative reconstruction, and/or weight based adjustment of the mA/kV was utilized to reduce the radiation dose to  as low as reasonably achievable. COMPARISON: None available. CLINICAL HISTORY: Low back pain, trauma. FINDINGS: BONES AND ALIGNMENT: Normal vertebral body heights. No acute fracture or suspicious bone lesion. Grade 1 retrolisthesis at L2-L3 and L3-L4. Lumbar levoscoliosis with apex at L4. DEGENERATIVE CHANGES: Multilevel degenerative disc disease with disc space narrowing and mild endplate remodeling. Multilevel severe lower lumbar facet arthrosis. Moderate spinal canal stenosis at L3-L4, L4-L5, and L5-S1. Moderate bilateral L5 neural foraminal stenosis. SOFT TISSUES: Calcific aortic sclerosis. Cholelithiasis. No acute abnormality. IMPRESSION: 1. No evidence of acute traumatic injury. 2. Moderate spinal canal stenosis at L3-L4, L4-L5, and L5-S1. 3. Moderate bilateral L5 neural foraminal stenosis. Electronically signed by: Franky Stanford MD MD 09/21/2024 11:35 PM EST RP Workstation: [id number]   CT HEAD WO CONTRAST ( ) Result Date: 09/21/2024 EXAM: CT HEAD WITHOUT CONTRAST 09/21/2024 11:27:03 PM TECHNIQUE: CT of the head was performed without the administration of intravenous contrast. Automated exposure control, iterative reconstruction, and/or weight based adjustment of the mA/kV was utilized to reduce the radiation dose to as low as reasonably achievable. COMPARISON:  07/18/2024 CLINICAL HISTORY: Delirium FINDINGS: BRAIN AND VENTRICLES: No acute hemorrhage. No evidence of acute infarct. Since the prior study, encephalomalacia has developed in the right PCA territory. This appears late subacute. MRI would be helpful for better temporal characterization. No hydrocephalus. No extra-axial collection. No mass effect or midline shift. ORBITS: No acute abnormality. SINUSES: No acute abnormality. SOFT TISSUES AND SKULL: No acute soft tissue abnormality. No skull fracture. IMPRESSION: 1. 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 2. MRI would be helpful for  more precise temporal characterization. Electronically signed by: Franky Stanford MD MD 09/21/2024 11:32 PM EST RP Workstation: [id number]     Procedures   Medications Ordered in the ED  zonisamide  (ZONEGRAN ) capsule 100 mg (100 mg Oral Not Given 09/22/24 1102)  enoxaparin  (LOVENOX ) injection 40 mg (40 mg Subcutaneous Not Given 09/22/24 1102)  acetaminophen  (TYLENOL ) tablet 650 mg (has no administration in time range)    Or  acetaminophen  (TYLENOL ) suppository 650 mg (has no administration in time range)  ondansetron  (ZOFRAN ) tablet 4 mg (has no administration in time range)    Or  ondansetron  (ZOFRAN ) injection 4 mg (has no administration in time range)  acetaminophen  (TYLENOL ) tablet 1,000 mg (1,000 mg Oral Patient Refused/Not Given 09/22/24 1601)  oxyCODONE  (Oxy IR/ROXICODONE ) immediate release tablet 5 mg (5 mg Oral Given 09/22/24 1428)   stroke: early stages of recovery book (has no administration in time range)  acetaminophen  (TYLENOL ) tablet 650 mg (650 mg Oral Given 09/22/24 0049)  fosfomycin  (MONUROL ) packet 3 g (3 g Oral Given 09/22/24 0123)    Clinical Course as of 09/22/24 1925  Sat Sep 21, 2024  2346 Leukocytes,Ua(!): LARGE [JS]  2346 WBC, UA: >50 [JS]  2346 Bacteria, UA(!): FEW [JS]    Clinical Course User Index [JS] Calvin Jablonowski, PA-C                                 Medical Decision Making Amount and/or Complexity of Data Reviewed Labs: ordered. Decision-making details documented in ED Course. Radiology: ordered.  Risk OTC drugs. Decision regarding hospitalization.   This patient presents to the ED for concern of back pain,change in mental status this involves a number of treatment options, and is a complaint that carries with it a high risk of complications and morbidity.  The differential diagnosis includes acute fracture, infection versus intracranial pathology.    Co morbidities: Discussed in HPI   Brief History:  See HPI.   EMR reviewed including pt  PMHx, past surgical history and past visits to ER.   See HPI for more details   Lab Tests:  I ordered and independently interpreted labs.  The pertinent results include:    CBC with a leukocytosis 12.3.  Hemoglobin is at her baseline.  CMP with no electrolyte derangement, BUN is slightly elevated, according to her daughter she has not had any oral intake in the last 48 hours.  Creatinine levels within normal limits.  LFTs are unremarkable.  Urinalysis with large leukocytes, greater than 50 white blood cell count along with few bacteria, the specimen was collected from a bedpan, high concern for infection as patient's mental status has changed over the last 48 hours.  Imaging Studies:  CT Lumbar spine showed: 1. No evidence of acute traumatic injury.  2. Moderate spinal canal stenosis at L3-L4, L4-L5, and L5-S1.  3. Moderate bilateral L5 neural foraminal stenosis.  CT thoracic spine showed: IMPRESSION:  1. Acute hyperextension injury at T10-T11 with fracture and uplifting of the  superior T11 endplate and anterior widening of the T10-11 disc space,  consistent with a T10-T11 B3 hyperextension distraction injury per AO Spine  classification.  2. Thoracic ankylosis   Medicines ordered:  I ordered medication including tylenol   for pain control Reevaluation of the patient after these medicines showed that the patient stayed the same I have reviewed the patients home medicines and have made adjustments as needed  Reevaluation:  After the interventions noted above I re-evaluated patient and found that they have :stayed the same  Social Determinants of Health:  The patient's social determinants of health were a factor in the care of this patient  Problem List / ED Course:  Patient presents to the ED status post fall which occurred 2 days ago.  Evaluated in the emergency department then, had plain films of her thoracic spine that did not show any acute findings.  According to  daughter at the bedside who flew from the [facility], her mother has had a decline in her mental status over the last 48 hours.  She has not been getting out of bed, refuses to walk, has had decrease in oral intake over the past 2 days.  She did hire 24-hour care as patient is currently in independent living, reports even with help patient decides that she is not going to walk or do anything.  She is usually more conversational, patient has had a change in her mental status.  Daughter voices concern for a urinary tract infection as she is prone to these.  She is also concerned there was worsening injury as patient is not ambulating. No new medications, no changes in her medical history otherwise.  Blood work here is benign, slight elevation of her BUN likely due to decrease in oral intake over the last couple of days.  Further imaging was obtained with CT lumbar spine along with CT thoracic spine.  No injury found along the lumbar spine, thoracic spine does have some concern for a T10-T11 fracture.  Patient is able to move bilateral lower extremities but does so with very minimal effort, sensation is intact throughout. CT of her head along with CT cervical spine without any acute findings at this time.  Patient was given Tylenol  for pain control.  Urinalysis obtained from a bedpan with large amount of white blood cell, large leukocytosis, some concern there for urinary tract infection in the setting of change in her mental status.  Will add culture at this time. Upon review of prior records enteric faecalis was detected on patient's urine culture back in November 2025.   Dispostion:  After consideration of the diagnostic results and the patients response to treatment, I feel that the patent would benefit from admission for further management.    Portions of this note were generated with Scientist, clinical (histocompatibility and immunogenetics). Dictation errors may occur despite best attempts at proofreading.   Final diagnoses:  Fall,  initial encounter  Closed fracture of tenth thoracic vertebra, unspecified fracture morphology, initial encounter (HCC)  Acute cystitis without hematuria  Altered mental status, unspecified altered mental status type    ED Discharge Orders     None          Maureen Broad, PA-C 09/22/24 1925    Franklyn Sid SAILOR, MD 09/27/24 [phone]  "

## 2024-09-21 NOTE — ED Triage Notes (Signed)
 [facility] Pt had a fall this past Thursday and was cleared here for injuries. Pt reports having increased pain that started today that was not relieved with pain meds.  Pt daughter reports increased confusion from time to time.   152/88 88HR

## 2024-09-21 NOTE — ED Notes (Signed)
 Sent urine down to the lab.

## 2024-09-22 ENCOUNTER — Inpatient Hospital Stay (HOSPITAL_COMMUNITY)

## 2024-09-22 DIAGNOSIS — I63531 Cerebral infarction due to unspecified occlusion or stenosis of right posterior cerebral artery: Secondary | ICD-10-CM | POA: Diagnosis present

## 2024-09-22 DIAGNOSIS — E669 Obesity, unspecified: Secondary | ICD-10-CM | POA: Diagnosis present

## 2024-09-22 DIAGNOSIS — R5381 Other malaise: Secondary | ICD-10-CM | POA: Diagnosis present

## 2024-09-22 DIAGNOSIS — N390 Urinary tract infection, site not specified: Secondary | ICD-10-CM | POA: Diagnosis present

## 2024-09-22 DIAGNOSIS — Z8249 Family history of ischemic heart disease and other diseases of the circulatory system: Secondary | ICD-10-CM | POA: Diagnosis not present

## 2024-09-22 DIAGNOSIS — R4182 Altered mental status, unspecified: Secondary | ICD-10-CM | POA: Diagnosis not present

## 2024-09-22 DIAGNOSIS — Y9301 Activity, walking, marching and hiking: Secondary | ICD-10-CM | POA: Diagnosis present

## 2024-09-22 DIAGNOSIS — S22088A Other fracture of T11-T12 vertebra, initial encounter for closed fracture: Secondary | ICD-10-CM | POA: Diagnosis present

## 2024-09-22 DIAGNOSIS — S22079A Unspecified fracture of T9-T10 vertebra, initial encounter for closed fracture: Secondary | ICD-10-CM | POA: Diagnosis not present

## 2024-09-22 DIAGNOSIS — I10 Essential (primary) hypertension: Secondary | ICD-10-CM | POA: Diagnosis present

## 2024-09-22 DIAGNOSIS — R569 Unspecified convulsions: Secondary | ICD-10-CM | POA: Diagnosis not present

## 2024-09-22 DIAGNOSIS — W1839XA Other fall on same level, initial encounter: Secondary | ICD-10-CM | POA: Diagnosis present

## 2024-09-22 DIAGNOSIS — Z888 Allergy status to other drugs, medicaments and biological substances status: Secondary | ICD-10-CM | POA: Diagnosis not present

## 2024-09-22 DIAGNOSIS — I779 Disorder of arteries and arterioles, unspecified: Secondary | ICD-10-CM | POA: Diagnosis not present

## 2024-09-22 DIAGNOSIS — S22089A Unspecified fracture of T11-T12 vertebra, initial encounter for closed fracture: Secondary | ICD-10-CM | POA: Diagnosis not present

## 2024-09-22 DIAGNOSIS — Z881 Allergy status to other antibiotic agents status: Secondary | ICD-10-CM | POA: Diagnosis not present

## 2024-09-22 DIAGNOSIS — Z803 Family history of malignant neoplasm of breast: Secondary | ICD-10-CM | POA: Diagnosis not present

## 2024-09-22 DIAGNOSIS — G40909 Epilepsy, unspecified, not intractable, without status epilepticus: Secondary | ICD-10-CM | POA: Diagnosis present

## 2024-09-22 DIAGNOSIS — M4324 Fusion of spine, thoracic region: Secondary | ICD-10-CM | POA: Diagnosis present

## 2024-09-22 DIAGNOSIS — Y92128 Other place in nursing home as the place of occurrence of the external cause: Secondary | ICD-10-CM | POA: Diagnosis not present

## 2024-09-22 DIAGNOSIS — N3000 Acute cystitis without hematuria: Secondary | ICD-10-CM | POA: Diagnosis not present

## 2024-09-22 DIAGNOSIS — R29702 NIHSS score 2: Secondary | ICD-10-CM | POA: Diagnosis not present

## 2024-09-22 DIAGNOSIS — Z885 Allergy status to narcotic agent status: Secondary | ICD-10-CM | POA: Diagnosis not present

## 2024-09-22 DIAGNOSIS — M545 Low back pain, unspecified: Secondary | ICD-10-CM | POA: Diagnosis present

## 2024-09-22 DIAGNOSIS — I6389 Other cerebral infarction: Secondary | ICD-10-CM | POA: Diagnosis not present

## 2024-09-22 DIAGNOSIS — R471 Dysarthria and anarthria: Secondary | ICD-10-CM | POA: Diagnosis present

## 2024-09-22 DIAGNOSIS — F32A Depression, unspecified: Secondary | ICD-10-CM | POA: Diagnosis present

## 2024-09-22 DIAGNOSIS — Z6834 Body mass index (BMI) 34.0-34.9, adult: Secondary | ICD-10-CM | POA: Diagnosis not present

## 2024-09-22 DIAGNOSIS — Z7982 Long term (current) use of aspirin: Secondary | ICD-10-CM | POA: Diagnosis not present

## 2024-09-22 DIAGNOSIS — E785 Hyperlipidemia, unspecified: Secondary | ICD-10-CM | POA: Diagnosis present

## 2024-09-22 DIAGNOSIS — Z85828 Personal history of other malignant neoplasm of skin: Secondary | ICD-10-CM | POA: Diagnosis not present

## 2024-09-22 DIAGNOSIS — W208XXA Other cause of strike by thrown, projected or falling object, initial encounter: Secondary | ICD-10-CM | POA: Diagnosis present

## 2024-09-22 DIAGNOSIS — W19XXXA Unspecified fall, initial encounter: Secondary | ICD-10-CM | POA: Diagnosis not present

## 2024-09-22 DIAGNOSIS — G9341 Metabolic encephalopathy: Secondary | ICD-10-CM | POA: Diagnosis present

## 2024-09-22 DIAGNOSIS — R29703 NIHSS score 3: Secondary | ICD-10-CM | POA: Diagnosis present

## 2024-09-22 DIAGNOSIS — H53462 Homonymous bilateral field defects, left side: Secondary | ICD-10-CM | POA: Diagnosis present

## 2024-09-22 DIAGNOSIS — M546 Pain in thoracic spine: Secondary | ICD-10-CM | POA: Diagnosis present

## 2024-09-22 DIAGNOSIS — I739 Peripheral vascular disease, unspecified: Secondary | ICD-10-CM | POA: Diagnosis not present

## 2024-09-22 DIAGNOSIS — E039 Hypothyroidism, unspecified: Secondary | ICD-10-CM | POA: Diagnosis present

## 2024-09-22 LAB — BASIC METABOLIC PANEL WITH GFR
Anion gap: 8 (ref 5–15)
BUN: 22 mg/dL (ref 8–23)
CO2: 29 mmol/L (ref 22–32)
Calcium: 9.2 mg/dL (ref 8.9–10.3)
Chloride: 105 mmol/L (ref 98–111)
Creatinine, Ser: 1.17 mg/dL — ABNORMAL HIGH (ref 0.44–1.00)
GFR, Estimated: 46 mL/min — ABNORMAL LOW
Glucose, Bld: 120 mg/dL — ABNORMAL HIGH (ref 70–99)
Potassium: 4.3 mmol/L (ref 3.5–5.1)
Sodium: 142 mmol/L (ref 135–145)

## 2024-09-22 LAB — GLUCOSE, CAPILLARY: Glucose-Capillary: 109 mg/dL — ABNORMAL HIGH (ref 70–99)

## 2024-09-22 LAB — CBC
HCT: 39.6 % (ref 36.0–46.0)
Hemoglobin: 12.4 g/dL (ref 12.0–15.0)
MCH: 28.2 pg (ref 26.0–34.0)
MCHC: 31.3 g/dL (ref 30.0–36.0)
MCV: 90 fL (ref 80.0–100.0)
Platelets: 211 K/uL (ref 150–400)
RBC: 4.4 MIL/uL (ref 3.87–5.11)
RDW: 14.7 % (ref 11.5–15.5)
WBC: 11 K/uL — ABNORMAL HIGH (ref 4.0–10.5)
nRBC: 0 % (ref 0.0–0.2)

## 2024-09-22 LAB — HEMOGLOBIN A1C
Hgb A1c MFr Bld: 6.1 % — ABNORMAL HIGH (ref 4.8–5.6)
Mean Plasma Glucose: 128.37 mg/dL

## 2024-09-22 MED ORDER — ONDANSETRON HCL 4 MG/2ML IJ SOLN: 4.0000 mg | Freq: Four times a day (QID) | INTRAMUSCULAR | Status: DC | PRN

## 2024-09-22 MED ORDER — ONDANSETRON HCL 4 MG PO TABS: 4.0000 mg | ORAL_TABLET | Freq: Four times a day (QID) | ORAL | Status: DC | PRN

## 2024-09-22 MED ORDER — STROKE: EARLY STAGES OF RECOVERY BOOK
Freq: Once | Status: AC
  Filled 2024-09-22: qty 1

## 2024-09-22 MED ORDER — ACETAMINOPHEN 325 MG PO TABS
650.0000 mg | ORAL_TABLET | Freq: Four times a day (QID) | ORAL | Status: DC | PRN
  Administered 2024-09-23: 650 mg via ORAL
  Filled 2024-09-22: qty 2

## 2024-09-22 MED ORDER — ENOXAPARIN SODIUM 40 MG/0.4ML IJ SOSY
40.0000 mg | PREFILLED_SYRINGE | INTRAMUSCULAR | Status: DC
  Filled 2024-09-22: qty 0.4

## 2024-09-22 MED ORDER — OXYCODONE HCL 5 MG PO TABS
5.0000 mg | ORAL_TABLET | Freq: Four times a day (QID) | ORAL | Status: DC | PRN
  Administered 2024-09-22 – 2024-09-26 (×8): 5 mg via ORAL
  Filled 2024-09-22 (×10): qty 1

## 2024-09-22 MED ORDER — ACETAMINOPHEN 500 MG PO TABS
1000.0000 mg | ORAL_TABLET | Freq: Three times a day (TID) | ORAL | Status: DC
  Administered 2024-09-22 – 2024-09-26 (×10): 1000 mg via ORAL
  Filled 2024-09-22 (×10): qty 2

## 2024-09-22 MED ORDER — ZONISAMIDE 100 MG PO CAPS
100.0000 mg | ORAL_CAPSULE | Freq: Every day | ORAL | Status: DC
  Administered 2024-09-24 – 2024-09-26 (×3): 100 mg via ORAL
  Filled 2024-09-22 (×5): qty 1

## 2024-09-22 MED ORDER — FOSFOMYCIN TROMETHAMINE 3 G PO PACK
3.0000 g | PACK | Freq: Once | ORAL | Status: AC
  Administered 2024-09-22: 3 g via ORAL
  Filled 2024-09-22: qty 3

## 2024-09-22 MED ORDER — ACETAMINOPHEN 650 MG RE SUPP: 650.0000 mg | Freq: Four times a day (QID) | RECTAL | Status: DC | PRN

## 2024-09-22 NOTE — H&P (Signed)
 " History and Physical    Katherine Obrien FMW:[id number] DOB: Aug 04, 1941 DOA: 09/21/2024  PCP: Mast, Man X, NP   Chief Complaint: back pain  HPI: Katherine Obrien is a 84 y.o. female with medical history significant of hypertension, hyperlipidemia who presents emergency department due to back pain.  Patient reportedly fell 2 days ago and presented to the ER.  He was seen on 1/8.  X-rays were unrevealing and patient was discharged with pain control.  He presented again today due to inability to care for herself.  On arrival she was afebrile and hemodynamically stable.  Labs were obtained which showed WBC 12.3, hemoglobin 13.4, creatinine 1.0, urinalysis concerning for infection.  Patient underwent CT spine which showed acute hyperextension of T10-T11 with fracture..  CT head showed right PCA encephalomalacia with recommended MRI follow-up.  Patient was treated for UTI and admitted for further workup.   Review of Systems: Review of Systems  Constitutional:  Positive for malaise/fatigue. Negative for chills and fever.  HENT: Negative.    Eyes: Negative.   Respiratory: Negative.    Cardiovascular: Negative.   Gastrointestinal: Negative.   Genitourinary: Negative.   Musculoskeletal: Negative.   Skin: Negative.   Neurological:  Positive for sensory change and weakness.  Psychiatric/Behavioral: Negative.       As per HPI otherwise 10 point review of systems negative.   Allergies[1]  Past Medical History:  Diagnosis Date   Arthritis    Cancer [facility])    skin   Cataract 2010   Dr. Donn, Dr.. Leverette @ [facility]   Epilepsy [facility]) 08/11/2015   Hyperlipidemia 12/08/2015   Hypertension    Hypothyroid    Polyp of colon 08/11/2015    Past Surgical History:  Procedure Laterality Date   ABDOMINAL HYSTERECTOMY  1970   APPENDECTOMY  1958   BREAST SURGERY  1984   COLON SURGERY  09/12/2006   FEMUR SURGERY  2018   Broken   TONSILLECTOMY  09/12/1944     reports that she has never smoked. She  has never used smokeless tobacco. She reports that she does not drink alcohol  and does not use drugs.  Family History  Problem Relation Age of Onset   Osteoporosis Mother    Dementia Mother    Arthritis Mother    Stroke Father 53       mini   Dementia Father    CVA Father    Breast cancer Sister    Cancer Sister        breast   Dementia Sister        severe   Alzheimer's disease Sister    Diabetes Sister    Hypertension Sister    Stroke Paternal Grandfather        1970    Prior to Admission medications  Medication Sig Start Date End Date Taking? Authorizing Provider  Cholecalciferol (VITAMIN D -3) 25 MCG (1000 UT) CAPS TAKE 1 CAPSULE DAILY WITH BREAKFAST. 09/24/19   Mast, Man X, NP  Multiple Vitamins-Minerals (ONE-A-DAY WOMENS 50 PLUS) TABS Take 1 tablet by mouth daily with breakfast.    [provider]  Multiple Vitamins-Minerals (PRESERVISION AREDS 2) CAPS Take 2 capsules by mouth daily.    [provider]  nitrofurantoin , macrocrystal-monohydrate, (MACROBID ) 100 MG capsule Take 1 capsule (100 mg total) by mouth 2 (two) times daily for 7 days. 09/21/24 09/28/24  Mast, Man X, NP  Polyethyl Glycol-Propyl Glycol (SYSTANE) 0.4-0.3 % SOLN Place 1-2 drops into both eyes 3 (three) times daily  as needed (for dryness).     [provider]  senna-docusate (SENOKOT-S) 8.6-50 MG tablet Take 1 tablet by mouth at bedtime as needed for mild constipation. Patient not taking: Reported on 09/19/2024 07/20/24   [name], MD  zonisamide  (ZONEGRAN ) 100 MG capsule Take 1 capsule (100 mg total) by mouth at bedtime. 02/19/24 09/19/24  Gregg Lek, MD    Physical Exam: Vitals:   09/21/24 2028 09/21/24 2030 09/21/24 2032 09/21/24 2100  BP: (!) 167/84 (!) 159/80  (!) 168/76  Pulse: 86 87 88 83  Resp: 18   17  Temp: 98.1 F (36.7 C)  98.1 F (36.7 C)   TempSrc: Oral  Oral   SpO2: 94% 93%  93%   Physical Exam Constitutional:      Appearance: She is normal weight.   HENT:     Head: Normocephalic.     Nose: Nose normal.     Mouth/Throat:     Mouth: Mucous membranes are moist.     Pharynx: Oropharynx is clear.  Eyes:     Conjunctiva/sclera: Conjunctivae normal.     Pupils: Pupils are equal, round, and reactive to light.  Cardiovascular:     Rate and Rhythm: Normal rate and regular rhythm.  Pulmonary:     Effort: Pulmonary effort is normal.  Abdominal:     General: Abdomen is flat. Bowel sounds are normal.  Musculoskeletal:        General: Normal range of motion.     Cervical back: Normal range of motion.  Skin:    General: Skin is warm and dry.     Capillary Refill: Capillary refill takes less than 2 seconds.  Neurological:     General: No focal deficit present.     Mental Status: She is alert.  Psychiatric:        Mood and Affect: Mood normal.        Behavior: Behavior normal.    \    Labs on Admission: I have personally reviewed the patients's labs and imaging studies.  Assessment/Plan Principal Problem:   Urinary tract infection   # Acute metabolic encephalopathy # Mechanical fall complicated by spinal fracture - Patient found to have urinary tract infection -Altered mental status may be due to infection versus pain  Plan: As needed pain control Administer fosfomycin  PT evaluation  # Seizure disorder-continue Zonegran   # Concern for changes on CT imaging-Will obtain MRI  Admission status: Inpatient Med-Surg  Certification: The appropriate patient status for this patient is INPATIENT. Inpatient status is judged to be reasonable and necessary in order to provide the required intensity of service to ensure the patient's safety. The patient's presenting symptoms, physical exam findings, and initial radiographic and laboratory data in the context of their chronic comorbidities is felt to place them at high risk for further clinical deterioration. Furthermore, it is not anticipated that the patient will be medically stable for  discharge from the hospital within 2 midnights of admission.   * I certify that at the point of admission it is my clinical judgment that the patient will require inpatient hospital care spanning beyond 2 midnights from the point of admission due to high intensity of service, high risk for further deterioration and high frequency of surveillance required.DEWAINE Lamar Dess MD [facility] If 7PM-7AM, please contact night-coverage [email or web address]  09/22/2024, 12:59 AM        [1]  Allergies Allergen Reactions   Barbiturates Other (See Comments)    A small  amount overdosed the patient- affected my stability and balance (might have been given at the same as another med?)   Cefdinir Other (See Comments)    A small amount overdosed the patient- affected my stability and balance (might have been given at the same as another med?)   Codeine Other (See Comments)    Moderate, per pharmacy   Phenobarbital  Other (See Comments)    A small amount overdosed the patient- affected my stability and balance (might have been given at the same as another med?)   Tegretol  [Carbamazepine ] Other (See Comments)    A small amount overdosed the patient- affected my stability and balance (might have been given at the same as another med?)   Tramadol Other (See Comments)    Moderate, per pharmacy   "

## 2024-09-22 NOTE — Progress Notes (Signed)
 RN assisted carelink with transferring pt for transport. PT alert and oriented to 4 and NIH is scored 0 at this time. Pt c/o of urine urgency, and hunger at this time. Pt passed swallow test. Pt is leaving at this time to [facility]W. Daughter is at bedside at time of transfer.

## 2024-09-22 NOTE — Plan of Care (Signed)

## 2024-09-22 NOTE — Progress Notes (Signed)
 " PROGRESS NOTE  Katherine Obrien  DOB: 05-16-1941  PCP: Mast, Man X, NP FMW:[id number]  DOA: 09/21/2024  LOS: 0 days  Hospital Day: 2  Subjective: Patient was seen and examined this afternoon. Elderly Caucasian female.  Lying on bed.  Alert, awake, slow to respond.  Oriented x 3.  No focal deficit Daughter at bedside We discussed about the imaging finding including MRI brain from this morning. Remains afebrile, hemodynamically stable Labs this morning with WBC 11   Brief narrative: Katherine Obrien is a 84 y.o. female with PMH significant for HTN, HLD, hypothyroidism, arthritis, epilepsy. Patient lives at [facility] independent living facility 1/8, patient had a witnessed fall at the facility.  She was going through an automatic door when it closed at her, knocked her over on her back.  Did not hit her head but he started having back pain since then.  She was seen in the ED.  Underwent x-ray of thoracic and lumbar spine which did not show any acute findings and was hence discharged back to facility.  Patient continued to have pain, also started to have confusion and hence she was brought back to ED on 1/10.  In the ED, patient was afebrile, blood pressure elevated 167/84, breathing room air Initial labs with WC count 12.3, otherwise unremarkable Urinalysis showed hazy yellow urine with large leukocytes, few bacteria CT head did not show any acute findings but showed right PCA territory encephalomalacia suggesting a late subacute infarct. CT cervical, thoracic and lumbar spine were obtained which showed acute hyperextension of T10-T11 with fracture..  Admitted to [facility] 1/11, MRI brain showed large subacute right PCA infarct, mild chronic small vessel ischemic disease  Assessment and plan: Acute back injury Had a fall on her back on 1/8 CT cervical, thoracic and lumbar spine 1/10 showed acute hyperextension of T10-T11 with fracture. Presented with inadequate pain control.  I  have started her on a scheduled Tylenol  and as needed oxycodone . Will check with IR if she qualifies for kyphoplasty. Pain regimen --- Scheduled: Tylenol  1 g 3 times daily --- PRN: Oxycodone  every 4 hours  Large subacute right PCA infarct MRI brain showed a large subacute right PCA infarct. Patient denies any prior known stroke. She was in her usual state of health until the door accident on 1/8.   She was in her usual state of health until the door accident on 1/8.  Did not have any focal neurological deficits. I spoke with neurologist Dr. Nichola about patient's clinical presentation and MRI.  Recommended to start aspirin  initiate transfer process to stroke unit at [place] for stroke workup.  UTI Urinalysis showed hazy yellow urine with large leukocytes, few bacteria Urine culture sent 1 dose of fosfomycin  was given in the ED   Acute metabolic encephalopathy Secondary to UTI, pain Continue monitor  Seizure disorder continue Zonegran   Nutrition Status:         Mobility: PT/OT eval ordered  Goals of care   Code Status: Full Code     DVT prophylaxis:  enoxaparin  (LOVENOX ) injection 40 mg Start: 09/22/24 1000 SCDs Start: 09/22/24 0059   Antimicrobials: 1 dose of fosfomycin  given in the ED Fluid: None Consultants: Neurology Family Communication: Daughter at bedside  Status: Inpatient Level of care:  Telemetry   Patient is from: Independent living facility Needs to continue in-hospital care: Stroke workup, may need kyphoplasty Anticipated d/c to: Pending clinical course      Diet:  Diet Order  Diet NPO time specified Except for: Sips with Meds  Diet effective midnight           Diet regular Room service appropriate? Yes; Fluid consistency: Thin  Diet effective now                   Scheduled Meds:  [START ON 09/23/2024]  stroke: early stages of recovery book   Does not apply Once   acetaminophen   1,000 mg Oral TID   enoxaparin   (LOVENOX ) injection  40 mg Subcutaneous Q24H   zonisamide   100 mg Oral Daily    PRN meds: acetaminophen  **OR** acetaminophen , ondansetron  **OR** ondansetron  (ZOFRAN ) IV, oxyCODONE    Infusions:    Antimicrobials: Anti-infectives (From admission, onward)    Start     Dose/Rate Route Frequency Ordered Stop   09/22/24 0115  fosfomycin  (MONUROL ) packet 3 g        3 g Oral  Once 09/22/24 0106 09/22/24 0123       Objective: Vitals:   09/22/24 0419 09/22/24 0447  BP: 133/68 133/68  Pulse: 83 83  Resp: 18 18  Temp: 98.1 F (36.7 C) 98.1 F (36.7 C)  SpO2:     No intake or output data in the 24 hours ending 09/22/24 1447 Filed Weights   09/22/24 0419 09/22/24 0447  Weight: 89.9 kg 89.9 kg   Weight change:  Body mass index is 34 kg/m.   Physical Exam: General exam: Pleasant, elderly Caucasian female.  In distress from inadequate pain control Skin: No rashes, lesions or ulcers. HEENT: Atraumatic, normocephalic, no obvious bleeding Lungs: Clear to auscultation bilaterally,  CVS: S1, S2, no murmur,   GI/Abd: Soft, nontender, nondistended, bowel sound present,   CNS: Alert, awake, slow to respond but oriented x 3 Psychiatry: Sad affect Extremities: No pedal edema, no calf tenderness,   Data Review: I have personally reviewed the laboratory data and studies available.  F/u labs ordered Unresulted Labs (From admission, onward)     Start     Ordered   09/23/24 0500  Lipid panel  (Labs)  Tomorrow morning,   R       Comments: Fasting    09/22/24 1442   09/22/24 1441  Hemoglobin A1c  (Labs)  Once,   R       Comments: To assess prior glycemic control    09/22/24 1442   09/21/24 2352  Urine Culture  Once,   URGENT       Question:  Indication  Answer:  Altered mental status (if no other cause identified)   09/21/24 2351            Signed, Chapman Rota, MD [facility] 09/22/2024  "

## 2024-09-22 NOTE — ED Notes (Signed)
 Pt given ham sandwich, ginger ale, graham crackers, and peanut butter. Pt weak with movements.

## 2024-09-23 ENCOUNTER — Encounter (HOSPITAL_COMMUNITY): Payer: Self-pay | Admitting: Internal Medicine

## 2024-09-23 ENCOUNTER — Inpatient Hospital Stay (HOSPITAL_COMMUNITY)

## 2024-09-23 DIAGNOSIS — R29703 NIHSS score 3: Secondary | ICD-10-CM | POA: Diagnosis not present

## 2024-09-23 DIAGNOSIS — R4182 Altered mental status, unspecified: Secondary | ICD-10-CM

## 2024-09-23 DIAGNOSIS — S22089A Unspecified fracture of T11-T12 vertebra, initial encounter for closed fracture: Secondary | ICD-10-CM

## 2024-09-23 DIAGNOSIS — S22079A Unspecified fracture of T9-T10 vertebra, initial encounter for closed fracture: Secondary | ICD-10-CM

## 2024-09-23 DIAGNOSIS — W19XXXA Unspecified fall, initial encounter: Secondary | ICD-10-CM

## 2024-09-23 DIAGNOSIS — M4324 Fusion of spine, thoracic region: Secondary | ICD-10-CM | POA: Diagnosis not present

## 2024-09-23 DIAGNOSIS — I6389 Other cerebral infarction: Secondary | ICD-10-CM | POA: Diagnosis not present

## 2024-09-23 DIAGNOSIS — I63531 Cerebral infarction due to unspecified occlusion or stenosis of right posterior cerebral artery: Secondary | ICD-10-CM

## 2024-09-23 DIAGNOSIS — N3000 Acute cystitis without hematuria: Secondary | ICD-10-CM | POA: Diagnosis not present

## 2024-09-23 LAB — LIPID PANEL
Cholesterol: 191 mg/dL (ref 0–200)
Cholesterol: 173 mg/dL (ref 0–200)
HDL: 51 mg/dL
HDL: 47 mg/dL
LDL Cholesterol: 121 mg/dL — ABNORMAL HIGH (ref 0–99)
LDL Cholesterol: 108 mg/dL — ABNORMAL HIGH (ref 0–99)
Total CHOL/HDL Ratio: 3.8 ratio
Total CHOL/HDL Ratio: 3.7 ratio
Triglycerides: 97 mg/dL
Triglycerides: 88 mg/dL
VLDL: 19 mg/dL (ref 0–40)
VLDL: 18 mg/dL (ref 0–40)

## 2024-09-23 LAB — ECHOCARDIOGRAM COMPLETE
AR max vel: 2.36 cm2
AV Area VTI: 2.5 cm2
AV Area mean vel: 2.51 cm2
AV Mean grad: 3 mmHg
AV Peak grad: 6.1 mmHg
Ao pk vel: 1.23 m/s
Area-P 1/2: 3.85 cm2
Height: 64.016 in
MV VTI: 2.92 cm2
S' Lateral: 2.6 cm
Weight: 3171.1 [oz_av]

## 2024-09-23 LAB — URINE CULTURE

## 2024-09-23 LAB — TYPE AND SCREEN
ABO/RH(D): A POS
Antibody Screen: NEGATIVE

## 2024-09-23 LAB — ABO/RH: ABO/RH(D): A POS

## 2024-09-23 MED ORDER — ROSUVASTATIN CALCIUM 20 MG PO TABS
20.0000 mg | ORAL_TABLET | Freq: Every day | ORAL | Status: DC
  Administered 2024-09-24 – 2024-09-26 (×3): 20 mg via ORAL
  Filled 2024-09-23 (×3): qty 1

## 2024-09-23 MED ORDER — IOHEXOL 350 MG/ML SOLN
75.0000 mL | Freq: Once | INTRAVENOUS | Status: AC | PRN
  Administered 2024-09-23: 75 mL via INTRAVENOUS

## 2024-09-23 MED ORDER — LACTATED RINGERS IV SOLN: INTRAVENOUS | Status: DC

## 2024-09-23 MED ORDER — HEPARIN SODIUM (PORCINE) 5000 UNIT/ML IJ SOLN
5000.0000 [IU] | Freq: Three times a day (TID) | INTRAMUSCULAR | Status: DC
  Administered 2024-09-23 – 2024-09-25 (×5): 5000 [IU] via SUBCUTANEOUS
  Filled 2024-09-23 (×6): qty 1

## 2024-09-23 MED ORDER — PERFLUTREN LIPID MICROSPHERE
1.0000 mL | INTRAVENOUS | Status: AC | PRN
  Administered 2024-09-23: 6 mL via INTRAVENOUS

## 2024-09-23 MED ORDER — LEVOFLOXACIN 500 MG PO TABS
500.0000 mg | ORAL_TABLET | ORAL | Status: AC
  Administered 2024-09-23 – 2024-09-25 (×2): 500 mg via ORAL
  Filled 2024-09-23 (×2): qty 1

## 2024-09-23 MED ORDER — MUSCLE RUB 10-15 % EX CREA
TOPICAL_CREAM | Freq: Two times a day (BID) | CUTANEOUS | Status: DC | PRN
  Filled 2024-09-23: qty 85

## 2024-09-23 MED ORDER — VITAMIN D 25 MCG (1000 UNIT) PO TABS: 1000.0000 [IU] | ORAL_TABLET | Freq: Every day | ORAL | Status: DC

## 2024-09-23 MED ORDER — ROSUVASTATIN CALCIUM 5 MG PO TABS
5.0000 mg | ORAL_TABLET | Freq: Every day | ORAL | Status: DC
  Filled 2024-09-23: qty 1

## 2024-09-23 NOTE — TOC CAGE-AID Note (Signed)
 Transition of Care [facility]) - CAGE-AID Screening   Patient Details  Name: Katherine Obrien MRN: [id number] Date of Birth: [birth year]  Transition of [facility]) CM/SW Contact:    Roselene Gray E Rylee Nuzum, LCSW Phone Number: 09/23/2024, 10:06 AM   Clinical Narrative: No SA noted.   CAGE-AID Screening:    Have You Ever Felt You Ought to Cut Down on Your Drinking or Drug Use?: No Have People Annoyed You By Critizing Your Drinking Or Drug Use?: No Have You Felt Bad Or Guilty About Your Drinking Or Drug Use?: No Have You Ever Had a Drink or Used Drugs First Thing In The Morning to Steady Your Nerves or to Get Rid of a Hangover?: No CAGE-AID Score: 0  Substance Abuse Education Offered: No

## 2024-09-23 NOTE — Progress Notes (Signed)
 Walked into pt's room and pt c/o discomfort in her back. Attempted to give her PRN oxy and scheduled Tyrenols, but pt refused and reported that pt only takes Tyrenols. Pt also refused scheduled meds including IV fluid/ PO abx/ home med/ Lovenox  shot. Explained the importance of taking scheduled meds and pt still refused to take them.

## 2024-09-23 NOTE — Progress Notes (Signed)
 OT Cancellation Note  Patient Details Name: Katherine Obrien MRN: [id number] DOB: 17-Sep-1940   Cancelled Treatment:    Reason Eval/Treat Not Completed: Medical issues which prohibited therapy  Neta Upadhyay D Elis Sauber 09/23/2024, 9:29 AM 09/23/2024 09/23/2024  RP, OTR/L  Acute Rehabilitation Services  Office:  [phone]

## 2024-09-23 NOTE — Progress Notes (Signed)
 PT Cancellation Note  Patient Details Name: Corley Kohls MRN: [id number] DOB: 1941-01-08   Cancelled Treatment:    Reason Eval/Treat Not Completed: Pain limiting ability to participate;Medical issues which prohibited therapy. Spoke with Dr. Dennise who requests check back tomorrow for PT Evaluation.  Ephraim Reichel, PT, DPT Acute Rehabilitation Services  Personal: Secure Chat Rehab Office: [phone]  Darice LITTIE Almas 09/23/2024, 9:21 AM

## 2024-09-23 NOTE — Progress Notes (Addendum)
 STROKE TEAM PROGRESS NOTE    INTERIM HISTORY/SUBJECTIVE  Patient has remained hemodynamically stable and afebrile overnight.  She continues to have left hemianopsia.  Her daughter is at the bedside.  She states that patient had called her a few days ago and appeared to be confused and thought may have had a stroke.  Did not seek medical help at that time.  MRI scan shows subacute right PCA infarct.  CT angiogram and echocardiogram are pending.  Patient had prior outpatient cardiac monitoring studies which were negative.  She will likely need a loop recorder at discharge Patient is scheduled to undergo kyphoplasty later today by interventional radiology OBJECTIVE  CBC    Component Value Date/Time   WBC 11.0 (H) 09/22/2024 0601   RBC 4.40 09/22/2024 0601   HGB 12.4 09/22/2024 0601   HCT 39.6 09/22/2024 0601   PLT 211 09/22/2024 0601   MCV 90.0 09/22/2024 0601   MCH 28.2 09/22/2024 0601   MCHC 31.3 09/22/2024 0601   RDW 14.7 09/22/2024 0601   LYMPHSABS 2.3 09/21/2024 2218   MONOABS 1.2 (H) 09/21/2024 2218   EOSABS 0.5 09/21/2024 2218   BASOSABS 0.1 09/21/2024 2218    BMET    Component Value Date/Time   NA 142 09/22/2024 0601   NA 141 10/19/2018 0000   K 4.3 09/22/2024 0601   CL 105 09/22/2024 0601   CL 106 10/19/2018 0000   CO2 29 09/22/2024 0601   CO2 30 10/19/2018 0000   GLUCOSE 120 (H) 09/22/2024 0601   BUN 22 09/22/2024 0601   BUN 13 10/19/2018 0000   CREATININE 1.17 (H) 09/22/2024 0601   CREATININE 0.93 03/05/2024 0709   CALCIUM  9.2 09/22/2024 0601   CALCIUM  9.0 10/19/2018 0000   EGFR 59 (L) 02/14/2023 0734   GFRNONAA 46 (L) 09/22/2024 0601   GFRNONAA 64 10/19/2018 0000    IMAGING past 24 hours ECHOCARDIOGRAM COMPLETE Result Date: 09/23/2024    ECHOCARDIOGRAM REPORT   Patient Name:   Katherine Obrien Console Date of Exam: 09/23/2024 Medical Rec #:  [id number]          Height:       64.0 in Accession #:    [id number]         Weight:       198.2 lb Date of Birth:   [birth year]         BSA:          1.949 m Patient Age:    84 years           BP:           150/74 mmHg Patient Gender: F                  HR:           79 bpm. Exam Location:  Inpatient Procedure: 2D Echo, Cardiac Doppler, Color Doppler and Intracardiac            Opacification Agent (Both Spectral and Color Flow Doppler were            utilized during procedure). Indications:    Stroke  History:        Patient has prior history of Echocardiogram examinations, most                 recent 01/31/2023. Risk Factors:Hypertension and Dyslipidemia.  Sonographer:    Sherlean Dubin Referring Phys: BINAYA DAHAL IMPRESSIONS  1. Left ventricular ejection fraction, by estimation, is 55 to 60%. The left ventricle  has normal function. The left ventricle has no regional wall motion abnormalities. There is mild concentric left ventricular hypertrophy. Left ventricular diastolic parameters are consistent with Grade I diastolic dysfunction (impaired relaxation).  2. Right ventricular systolic function is normal. The right ventricular size is normal. There is normal pulmonary artery systolic pressure.  3. Left atrial size was mildly dilated.  4. The mitral valve was not well visualized. No evidence of mitral valve regurgitation. Severe mitral annular calcification.  5. The aortic valve is grossly normal. There is moderate calcification of the aortic valve. There is moderate thickening of the aortic valve. Aortic valve regurgitation is not visualized. Aortic valve sclerosis/calcification is present, without any evidence of aortic stenosis.  6. The inferior vena cava is normal in size with <50% respiratory variability, suggesting right atrial pressure of 8 mmHg. Comparison(s): No significant change from prior study. Conclusion(s)/Recommendation(s): Technically challenging study, valves not well visualized. If there is clinical concern for embolic etiology of stroke, would consider TEE for better evaluation. FINDINGS  Left Ventricle:  Left ventricular ejection fraction, by estimation, is 55 to 60%. The left ventricle has normal function. The left ventricle has no regional wall motion abnormalities. Definity  contrast agent was given IV to delineate the left ventricular  endocardial borders. The left ventricular internal cavity size was normal in size. There is mild concentric left ventricular hypertrophy. Left ventricular diastolic parameters are consistent with Grade I diastolic dysfunction (impaired relaxation). Right Ventricle: The right ventricular size is normal. Right vetricular wall thickness was not well visualized. Right ventricular systolic function is normal. There is normal pulmonary artery systolic pressure. The tricuspid regurgitant velocity is 1.30 m/s, and with an assumed right atrial pressure of 8 mmHg, the estimated right ventricular systolic pressure is 14.8 mmHg. Left Atrium: Left atrial size was mildly dilated. Right Atrium: Right atrial size was normal in size. Pericardium: There is no evidence of pericardial effusion. Mitral Valve: The mitral valve was not well visualized. Severe mitral annular calcification. No evidence of mitral valve regurgitation. MV peak gradient, 6.6 mmHg. The mean mitral valve gradient is 3.0 mmHg. Tricuspid Valve: The tricuspid valve is not well visualized. Tricuspid valve regurgitation is trivial. No evidence of tricuspid stenosis. Aortic Valve: The aortic valve is grossly normal. There is moderate calcification of the aortic valve. There is moderate thickening of the aortic valve. Aortic valve regurgitation is not visualized. Aortic valve sclerosis/calcification is present, without any evidence of aortic stenosis. Aortic valve mean gradient measures 3.0 mmHg. Aortic valve peak gradient measures 6.1 mmHg. Aortic valve area, by VTI measures 2.50 cm. Pulmonic Valve: The pulmonic valve was not well visualized. Pulmonic valve regurgitation is not visualized. No evidence of pulmonic stenosis. Aorta: The  aortic root is normal in size and structure, the aortic arch was not well visualized and the ascending aorta was not well visualized. Venous: The inferior vena cava is normal in size with less than 50% respiratory variability, suggesting right atrial pressure of 8 mmHg. IAS/Shunts: The atrial septum is grossly normal.  LEFT VENTRICLE PLAX 2D LVIDd:         3.66 cm   Diastology LVIDs:         2.60 cm   LV e' medial:    4.35 cm/s LV PW:         1.15 cm   LV E/e' medial:  17.8 LV IVS:        1.20 cm   LV e' lateral:   4.46 cm/s LVOT diam:  2.00 cm   LV E/e' lateral: 17.4 LV SV:         62 LV SV Index:   32 LVOT Area:     3.14 cm  RIGHT VENTRICLE             IVC RV S prime:     15.40 cm/s  IVC diam: 1.80 cm TAPSE (M-mode): 1.8 cm LEFT ATRIUM             Index        RIGHT ATRIUM           Index LA diam:        2.70 cm 1.39 cm/m   RA Area:     17.00 cm LA Vol (A2C):   69.8 ml 35.82 ml/m  RA Volume:   48.10 ml  24.68 ml/m LA Vol (A4C):   51.1 ml 26.22 ml/m LA Biplane Vol: 58.9 ml 30.22 ml/m  AORTIC VALVE AV Area (Vmax):    2.36 cm AV Area (Vmean):   2.51 cm AV Area (VTI):     2.50 cm AV Vmax:           123.00 cm/s AV Vmean:          80.650 cm/s AV VTI:            0.248 m AV Peak Grad:      6.1 mmHg AV Mean Grad:      3.0 mmHg LVOT Vmax:         92.50 cm/s LVOT Vmean:        64.450 cm/s LVOT VTI:          0.198 m LVOT/AV VTI ratio: 0.80  AORTA Ao Root diam: 2.80 cm MITRAL VALVE                TRICUSPID VALVE MV Area (PHT): 3.85 cm     TR Peak grad:   6.8 mmHg MV Area VTI:   2.92 cm     TR Vmax:        130.00 cm/s MV Peak grad:  6.6 mmHg MV Mean grad:  3.0 mmHg     SHUNTS MV Vmax:       1.28 m/s     Systemic VTI:  0.20 m MV Vmean:      78.2 cm/s    Systemic Diam: 2.00 cm MV Decel Time: 197 msec MV E velocity: 77.60 cm/s MV A velocity: 128.00 cm/s MV E/A ratio:  0.61 Shelda Bruckner MD Electronically signed by Shelda Bruckner MD Signature Date/Time: 09/23/2024/11:36:32 AM    Final     Vitals:    09/23/24 0000 09/23/24 0505 09/23/24 0800 09/23/24 1218  BP: 126/81 (!) 150/74 127/76 (!) 114/56  Pulse: 87 85 79 70  Resp: 15 16 16 11   Temp: 98.6 F (37 C) 98.4 F (36.9 C) 98.2 F (36.8 C) (!) 97.5 F (36.4 C)  TempSrc: Axillary Axillary Axillary Oral  SpO2: 95% 99% 98% 97%  Weight:      Height:         PHYSICAL EXAM General:  Alert, well-nourished, well-developed patient in no acute distress Psych:  Mood and affect appropriate for situation CV: Regular rate and rhythm on monitor Respiratory:  Regular, unlabored respirations on room air   NEURO:  Mental Status: AA&Ox3, patient is able to give clear and coherent history Speech/Language: speech is without dysarthria or aphasia.  Naming, repetition, fluency, and comprehension intact.  Cranial Nerves:  II: PERRL.  Left dense homonymous  hemianopsia III, IV, VI: EOMI. Eyelids elevate symmetrically.  V: Sensation is intact to light touch and symmetrical to face.  VII: Face is symmetrical resting and smiling VIII: hearing intact to voice. IX, X: Phonation is normal.  XII: tongue is midline without fasciculations. Motor: Able to move all 4 extremities with antigravity strength Tone: is normal and bulk is normal Sensation- Intact to light touch bilaterally.  Coordination: FTN intact bilaterally Gait- deferred  Most Recent NIH  1a Level of Conscious.: 0 1b LOC Questions: 0 1c LOC Commands: 0 2 Best Gaze: 0 3 Visual: 2 4 Facial Palsy: 0 5a Motor Arm - left: 0 5b Motor Arm - Right: 0 6a Motor Leg - Left: 0 6b Motor Leg - Right: 0 7 Limb Ataxia: 0 8 Sensory: 0 9 Best Language: 0 10 Dysarthria: 0 11 Extinct. and Inatten.: 0 TOTAL: 2   ASSESSMENT/PLAN  Ms. Katherine Obrien is a 84 y.o. female with history of hypertension, hyperlipidemia and seizures admitted after a fall at her facility where she was struck by an automatic door and suffered thoracic spinal fractures.  Brain MRI revealed right PCA territory infarct.   NIH on Admission 3  Acute Ischemic Infarct:  right PCA territory infarct  Etiology: Potentially embolic CT head right PCA territory encephalomalacia suggesting late subacute infarct CTA head & neck pending MRI subacute right PCA territory infarct, mild chronic small vessel ischemic disease 2D Echo EF 55 to 60%, mild concentric LVH, grade 1 diastolic dysfunction, mildly dilated left atrium, trivial tricuspid regurgitation, grossly normal atrial septum Will need loop recorder prior to discharge LDL 108 HgbA1c 6.1 VTE prophylaxis -Lovenox  No antithrombotic prior to admission, now on No antithrombotic as she is undergoing kyphoplasty possibly today Therapy recommendations:  Pending Disposition: Pending  Hypertension Home meds: None Stable Blood Pressure Goal: BP less than 220/110   Hyperlipidemia Home meds: None LDL 108, goal < 70 Add rosuvastatin  20 mg daily Continue statin at discharge  Other Stroke Risk Factors Obesity, Body mass index is 34 kg/m., BMI >/= 30 associated with increased stroke risk, recommend weight loss, diet and exercise as appropriate    Other Active Problems Thoracic spinal fractures-kyphoplasty tentatively planned for today Seizures-continue home [facility]   Hospital day # 1  Patient seen by NP with MD, MD to edit note as needed. Cortney E Everitt Clint Kill , MSN, AGACNP-BC [facility] See Amion for schedule and pager information 09/23/2024 2:33 PM   I have personally obtained history,examined this patient, reviewed notes, independently viewed imaging studies, participated in medical decision making and plan of care.ROS completed by me personally and pertinent positives fully documented  I have made any additions or clarifications directly to the above note. Agree with note above.  Patient presented with a fall due to head injury when a door open in front of her possibly due to patient having left hemianopsia from her subacute right PCA stroke and not  knowing it.  Continue ongoing stroke workup.  Telemetry monitoring.  Will likely need loop recorder at discharge for paroxysmal A-fib.  Start aspirin  after kyphoplasty and maintain aggressive risk factor modification.  Patient will likely need rehab in a skilled nursing setting Long discussion with patient and daughter at the bedside and answered questions.  Discussed with Dr. Dennise.   I personally spent a total of 50 minutes in the care of the patient today including getting/reviewing separately obtained history, performing a medically appropriate exam/evaluation, counseling and educating, placing orders, referring and communicating with other health  care professionals, documenting clinical information in the EHR, independently interpreting results, and coordinating care.        Eather Popp, MD Medical Director [facility] Pager: [phone] 09/23/2024 3:54 PM   To contact Stroke Continuity provider, please refer to [email or web address]. After hours, contact General Neurology

## 2024-09-23 NOTE — Plan of Care (Signed)
" °  Problem: Clinical Measurements: Goal: Ability to maintain clinical measurements within normal limits will improve Outcome: Progressing Goal: Will remain free from infection Outcome: Progressing Goal: Diagnostic test results will improve Outcome: Progressing   Problem: Activity: Goal: Risk for activity intolerance will decrease Outcome: Progressing   Problem: Pain Managment: Goal: General experience of comfort will improve and/or be controlled Outcome: Progressing   Problem: Safety: Goal: Ability to remain free from injury will improve Outcome: Progressing   Problem: Skin Integrity: Goal: Risk for impaired skin integrity will decrease Outcome: Progressing   Problem: Education: Goal: Knowledge of disease or condition will improve Outcome: Progressing Goal: Knowledge of patient specific risk factors will improve (DELETE if not current risk factor) Outcome: Progressing   Problem: Ischemic Stroke/TIA Tissue Perfusion: Goal: Complications of ischemic stroke/TIA will be minimized Outcome: Progressing   "

## 2024-09-23 NOTE — Progress Notes (Signed)
 "                                                                                                                                                                                                                                                                                PROGRESS NOTE     Patient Demographics:    Katherine Obrien, is a 84 y.o. female, DOB - 08-05-1941, FMW:[id number]  Outpatient Primary MD for the patient is Mast, Man X, NP    LOS - 1  Admit date - 09/21/2024    No chief complaint on file.      Brief Narrative (HPI from H&P)   84 y.o. female with PMH significant for HTN, HLD, hypothyroidism, arthritis, epilepsy. Patient lives at [facility] independent living facility. 1/8, patient had a witnessed fall at the facility.  She was going through an automatic door when it closed at her, knocked her over on her back.  Did not hit her head but he started having back pain since then.  She was seen in the ED.  Underwent x-ray of thoracic and lumbar spine which did not show any acute findings and was hence discharged back to facility.  Patient continued to have pain, also started to have confusion and hence she was brought back to ED on 1/10.  In the ER workup suggestive of acute T10/T11 spine fracture, large subacute right PCA infarct without any correlating focal deficits, UTI along with mild metabolic encephalopathy and she was admitted to the hospital.   Subjective:    Katherine Obrien today has, No headache, No chest pain, No abdominal pain - No Nausea, No new weakness tingling or numbness, no shortness of breath, does have ongoing back pain.   Assessment  & Plan :   Acute back injury - Had a fall on her back on 1/8, CT of the spine suggests acute hyperextension of T10-T11 with fracture, getting supportive care, IR has been consulted for possible kyphoplasty, patient does not want to use narcotics taking Tylenol  for now.  Once cleared by IR will commence PT OT.  Required TLSO  brace will have IR opine.   Large subacute right PCA infarct - MRI brain  showed a large subacute right PCA infarct.  No new focal deficits, seen by neurology initially in the ER, full stroke workup, aspirin  once cleared by IR, low-dose statin if patient accepts, LDL slightly above goal, A1c is acceptable will have PCP monitor, CTA and echo pending.  UTI Urinalysis showed hazy yellow urine with large leukocytes, pending, 3 doses of Levaquin  oral if patient accepts, has multiple allergies.  Acute metabolic encephalopathy Secondary to UTI, pain, improved continue to monitor.  History of seizure disorder continue Zonegran       Condition - Extremely Guarded  Family Communication  : Called and left a message at son's cell phone on 09/23/2024 around 8 AM. Daughter updated on her cell phone on 09/23/2024  Code Status : Full code  Consults  : Neurology, IR  PUD Prophylaxis :     Procedures  :     TTE -  MRI T-spine.  CTA head and neck.    MRI brain.  1. Large subacute right PCA infarct. 2. Mild chronic small vessel ischemic disease   CT head and neck.  Nonacute.  CT T-spine.   1. Acute hyperextension injury at T10-T11 with fracture and uplifting of the superior T11 endplate and anterior widening of the T10-11 disc space, consistent with a T10-T11 B3 hyperextension distraction injury per AO Spine classification. 2. Thoracic ankylosis  CT L-spine. 1. No evidence of acute traumatic injury. 2. Moderate spinal canal stenosis at L3-L4, L4-L5, and L5-S1. 3. Moderate bilateral L5 neural foraminal stenosis.      Disposition Plan  :    Status is: Inpatient  DVT Prophylaxis  :    enoxaparin  (LOVENOX ) injection 40 mg Start: 09/22/24 1000 SCDs Start: 09/22/24 0059    Lab Results  Component Value Date   PLT 211 09/22/2024    Diet :  Diet Order             Diet NPO time specified Except for: Sips with Meds  Diet effective midnight                    Inpatient  Medications  Scheduled Meds:  acetaminophen   1,000 mg Oral TID   enoxaparin  (LOVENOX ) injection  40 mg Subcutaneous Q24H   levofloxacin   500 mg Oral QODAY   rosuvastatin   5 mg Oral Daily   [START ON 09/24/2024] Vitamin D -3  1,000 Units Oral Q breakfast   zonisamide   100 mg Oral Daily   Continuous Infusions:  lactated ringers      PRN Meds:.acetaminophen  **OR** acetaminophen , Muscle Rub, ondansetron  **OR** ondansetron  (ZOFRAN ) IV, oxyCODONE , perflutren  lipid microspheres (DEFINITY ) IV suspension  Antibiotics  :    Anti-infectives (From admission, onward)    Start     Dose/Rate Route Frequency Ordered Stop   09/23/24 0630  levofloxacin  (LEVAQUIN ) tablet 500 mg        500 mg Oral Every other day 09/23/24 0537 09/27/24 0959   09/22/24 0115  fosfomycin  (MONUROL ) packet 3 g        3 g Oral  Once 09/22/24 0106 09/22/24 0123         Objective:   Vitals:   09/22/24 2041 09/23/24 0000 09/23/24 0505 09/23/24 0800  BP: (!) 145/77 126/81 (!) 150/74 127/76  Pulse: 88 87 85 79  Resp: (!) 21 15 16 16   Temp: 98.7 F (37.1 C) 98.6 F (37 C) 98.4 F (36.9 C) 98.2 F (36.8 C)  TempSrc: Oral Axillary Axillary Axillary  SpO2: 93% 95% 99% 98%  Weight:  Height:        Wt Readings from Last 3 Encounters:  09/22/24 89.9 kg  09/19/24 90.4 kg  07/18/24 91.4 kg     Intake/Output Summary (Last 24 hours) at 09/23/2024 1003 Last data filed at 09/22/2024 1720 Gross per 24 hour  Intake 240 ml  Output 600 ml  Net -360 ml     Physical Exam  Awake Alert, No new F.N deficits, Normal affect [place].AT,PERRAL Supple Neck, No JVD,   Symmetrical Chest wall movement, Good air movement bilaterally, CTAB RRR,No Gallops,Rubs or new Murmurs,  +ve B.Sounds, Abd Soft, No tenderness,   No Cyanosis, Clubbing or edema       Data Review:    Recent Labs  Lab 09/21/24 2218 09/22/24 0601  WBC 12.3* 11.0*  HGB 13.4 12.4  HCT 41.2 39.6  PLT 215 211  MCV 89.0 90.0  MCH 28.9 28.2  MCHC 32.5  31.3  RDW 14.9 14.7  LYMPHSABS 2.3  --   MONOABS 1.2*  --   EOSABS 0.5  --   BASOSABS 0.1  --     Recent Labs  Lab 09/21/24 2218 09/22/24 0600 09/22/24 0601  NA 139  --  142  K 3.7  --  4.3  CL 104  --  105  CO2 26  --  29  ANIONGAP 10  --  8  GLUCOSE 105*  --  120*  BUN 24*  --  22  CREATININE 1.00  --  1.17*  AST 21  --   --   ALT 14  --   --   ALKPHOS 115  --   --   BILITOT 0.4  --   --   ALBUMIN 3.6  --   --   HGBA1C  --  6.1*  --   CALCIUM  9.1  --  9.2      Recent Labs  Lab 09/21/24 2218 09/22/24 0600 09/22/24 0601  HGBA1C  --  6.1*  --   CALCIUM  9.1  --  9.2    --------------------------------------------------------------------------------------------------------------- Lab Results  Component Value Date   CHOL 191 09/23/2024   HDL 51 09/23/2024   LDLCALC 121 (H) 09/23/2024   TRIG 97 09/23/2024   CHOLHDL 3.8 09/23/2024    Lab Results  Component Value Date   HGBA1C 6.1 (H) 09/22/2024   No results for input(s): TSH, T4TOTAL, FREET4, T3FREE, THYROIDAB in the last 72 hours. No results for input(s): VITAMINB12, FOLATE, FERRITIN, TIBC, IRON, RETICCTPCT in the last 72 hours. ------------------------------------------------------------------------------------------------------------------ Cardiac Enzymes No results for input(s): CKMB, TROPONINI, MYOGLOBIN in the last 168 hours.  Invalid input(s): CK  Micro Results Recent Results (from the past 240 hours)  Urine Culture     Status: Abnormal   Collection Time: 09/21/24 11:56 PM   Specimen: Urine, Clean Catch  Result Value Ref Range Status   Specimen Description   Final    URINE, CLEAN CATCH Performed at [facility], [address], [place], [place] [address]    Special Requests   Final    NONE Performed at [facility] [address] [place], [place] [address]    Culture MULTIPLE SPECIES PRESENT, SUGGEST RECOLLECTION (A)   Final   Report Status 09/23/2024 FINAL  Final    Radiology Report MR BRAIN WO CONTRAST Result Date: 09/22/2024 EXAM: MRI BRAIN WITHOUT CONTRAST 09/22/2024 11:18:41 AM TECHNIQUE: Multiplanar multisequence MRI of the head/brain was performed without the administration of intravenous contrast. COMPARISON: Head CT 09/21/2024. CLINICAL HISTORY: Stroke.  FINDINGS: BRAIN AND VENTRICLES: A large subacute right PCA infarct is again seen with associated restricted diffusion, cytotoxic edema, cortical laminar necrosis, and likely minimal petechial blood products. No mass, midline shift, extra-axial fluid collection, or hydrocephalus is evident. T2 hyperintensities in the cerebral white matter bilaterally are nonspecific but compatible with mild chronic small vessel ischemic disease. There is mild generalized cerebral atrophy. Major intracranial vascular flow voids are preserved. ORBITS: Bilateral cataract extraction. SINUSES AND MASTOIDS: Minimal mucosal thickening in the paranasal sinuses. Clear mastoid air cells. BONES AND SOFT TISSUES: Normal marrow signal. No significant soft tissue abnormality. IMPRESSION: 1. Large subacute right PCA infarct. 2. Mild chronic small vessel ischemic disease. Electronically signed by: Dasie Hamburg MD MD 09/22/2024 11:49 AM EST RP Workstation: [id number]   CT CERVICAL SPINE WO CONTRAST Result Date: 09/21/2024 EXAM: CT CERVICAL SPINE WITHOUT CONTRAST 09/21/2024 11:27:03 PM TECHNIQUE: CT of the cervical spine was performed without the administration of intravenous contrast. Multiplanar reformatted images are provided for review. Automated exposure control, iterative reconstruction, and/or weight based adjustment of the mA/kV was utilized to reduce the radiation dose to as low as reasonably achievable. COMPARISON: None available. CLINICAL HISTORY: fall FINDINGS: BONES AND ALIGNMENT: No acute fracture or traumatic malalignment. DEGENERATIVE CHANGES: Degenerative disc disease is greatest at  C5-C6, where there is endplate sclerosis and uncovertebral spurring. Lytic atherosclerosis of the facet. No high-grade cervical spinal canal stenosis. SOFT TISSUES: No prevertebral soft tissue swelling. IMPRESSION: 1. Degenerative disc disease greatest at C5-C6 with endplate sclerosis and uncovertebral spurring. 2. No high-grade cervical spinal canal stenosis. Electronically signed by: Franky Stanford MD MD 09/21/2024 11:45 PM EST RP Workstation: [id number]   CT Thoracic Spine Wo Contrast Result Date: 09/21/2024 EXAM: CT THORACIC SPINE WITHOUT CONTRAST 09/21/2024 11:27:03 PM TECHNIQUE: CT of the thoracic spine was performed without the administration of intravenous contrast. Multiplanar reformatted images are provided for review. Automated exposure control, iterative reconstruction, and/or weight based adjustment of the mA/kV was utilized to reduce the radiation dose to as low as reasonably achievable. COMPARISON: None available. CLINICAL HISTORY: Mid-back pain. FINDINGS: BONES AND ALIGNMENT: Vertebral body heights are maintained except for the superior T11 endplate, which is fractured and uplifted. At T10-T11, there is evidence of acute hyperextension injury with anterior widening of the T10-T11 disc space. There is no visible osseous extension of the fracture into the posterior elements. According to the AO Spine classification of thoracolumbar injuries, the finding is consistent with a T10-T11: B3 hyperextension distraction injury (with associated anterior endplate fracture at T11) and the recommendation is not specified by this descriptive classification alone. No suspicious bone lesion. DEGENERATIVE CHANGES: There are flowing anterior syndesmophytes along the length of the thoracic spine. SOFT TISSUES: No acute abnormality. IMPRESSION: 1. Acute hyperextension injury at T10-T11 with fracture and uplifting of the superior T11 endplate and anterior widening of the T10-11 disc space, consistent with a T10-T11 B3  hyperextension distraction injury per AO Spine classification. 2. Thoracic ankylosis Electronically signed by: Franky Stanford MD MD 09/21/2024 11:40 PM EST RP Workstation: [id number]   CT Lumbar Spine Wo Contrast Result Date: 09/21/2024 EXAM: CT OF THE LUMBAR SPINE WITHOUT CONTRAST 09/21/2024 11:27:03 PM TECHNIQUE: CT of the lumbar spine was performed without the administration of intravenous contrast. Multiplanar reformatted images are provided for review. Automated exposure control, iterative reconstruction, and/or weight based adjustment of the mA/kV was utilized to reduce the radiation dose to as low as reasonably achievable. COMPARISON: None available. CLINICAL HISTORY: Low back pain, trauma. FINDINGS: BONES AND ALIGNMENT: Normal vertebral  body heights. No acute fracture or suspicious bone lesion. Grade 1 retrolisthesis at L2-L3 and L3-L4. Lumbar levoscoliosis with apex at L4. DEGENERATIVE CHANGES: Multilevel degenerative disc disease with disc space narrowing and mild endplate remodeling. Multilevel severe lower lumbar facet arthrosis. Moderate spinal canal stenosis at L3-L4, L4-L5, and L5-S1. Moderate bilateral L5 neural foraminal stenosis. SOFT TISSUES: Calcific aortic sclerosis. Cholelithiasis. No acute abnormality. IMPRESSION: 1. No evidence of acute traumatic injury. 2. Moderate spinal canal stenosis at L3-L4, L4-L5, and L5-S1. 3. Moderate bilateral L5 neural foraminal stenosis. Electronically signed by: Franky Stanford MD MD 09/21/2024 11:35 PM EST RP Workstation: [id number]   CT HEAD WO CONTRAST ( ) Result Date: 09/21/2024 EXAM: CT HEAD WITHOUT CONTRAST 09/21/2024 11:27:03 PM TECHNIQUE: CT of the head was performed without the administration of intravenous contrast. Automated exposure control, iterative reconstruction, and/or weight based adjustment of the mA/kV was utilized to reduce the radiation dose to as low as reasonably achievable. COMPARISON: 07/18/2024 CLINICAL HISTORY: Delirium FINDINGS:  BRAIN AND VENTRICLES: No acute hemorrhage. No evidence of acute infarct. Since the prior study, encephalomalacia has developed in the right PCA territory. This appears late subacute. MRI would be helpful for better temporal characterization. No hydrocephalus. No extra-axial collection. No mass effect or midline shift. ORBITS: No acute abnormality. SINUSES: No acute abnormality. SOFT TISSUES AND SKULL: No acute soft tissue abnormality. No skull fracture. IMPRESSION: 1. 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 2. MRI would be helpful for more precise temporal characterization. Electronically signed by: Franky Stanford MD MD 09/21/2024 11:32 PM EST RP Workstation: [id number]     Signature  -   Lavada Stank M.D on 09/23/2024 at 10:03 AM   -  To page go to [email or web address]   "

## 2024-09-23 NOTE — Plan of Care (Signed)
  Problem: Clinical Measurements: Goal: Ability to maintain clinical measurements within normal limits will improve Outcome: Progressing Goal: Will remain free from infection Outcome: Progressing Goal: Diagnostic test results will improve Outcome: Progressing   Problem: Activity: Goal: Risk for activity intolerance will decrease Outcome: Progressing   Problem: Pain Managment: Goal: General experience of comfort will improve and/or be controlled Outcome: Progressing   Problem: Safety: Goal: Ability to remain free from injury will improve Outcome: Progressing   Problem: Skin Integrity: Goal: Risk for impaired skin integrity will decrease Outcome: Progressing

## 2024-09-23 NOTE — TOC Initial Note (Signed)
 Transition of Care [facility]) - Initial/Assessment Note    Patient Details  Name: Katherine Obrien MRN: [id number] Date of Birth: 1941-09-05  Transition of Care [facility] CM/SW Contact:    Inocente GORMAN Kindle, LCSW Phone Number: 09/23/2024, 12:25 PM  Clinical Narrative:                 Patient admitted from [facility]. CSW received call from Amber there [phone]) and she reported that if patient requires SNF rehab, they can accommodate patient. She would qualify for SNF on 1/14 through insurance. Will continue to follow.     Barriers to Discharge: Continued Medical Work up   Patient Goals and CMS Choice            Expected Discharge Plan and Services       Living arrangements for the past 2 months: Independent Living Facility                                      Prior Living Arrangements/Services Living arrangements for the past 2 months: Independent Living Facility Lives with:: Self Patient language and need for interpreter reviewed:: Yes Do you feel safe going back to the place where you live?: Yes      Need for Family Participation in Patient Care: Yes (Comment) Care giver support system in place?: Yes (comment) Current home services: DME ([employer]; HHPT/OT thru [facility] [place]) Criminal Activity/Legal Involvement Pertinent to Current Situation/Hospitalization: No - Comment as needed  Activities of Daily Living   ADL Screening (condition at time of admission) Independently performs ADLs?: Yes (appropriate for developmental age)  Permission Sought/Granted Permission sought to share information with : Photographer granted to share info w AGENCY: [facility]        Emotional Assessment Appearance:: Appears stated age     Orientation: : Oriented to Self, Oriented to Place, Oriented to Situation Alcohol  / Substance Use: Not Applicable Psych Involvement: No (comment)  Admission diagnosis:   Trauma [T14.90XA] Urinary tract infection [N39.0] Acute cystitis without hematuria [N30.00] Fall, initial encounter [W19.XXXA] Altered mental status, unspecified altered mental status type [R41.82] Closed fracture of tenth thoracic vertebra, unspecified fracture morphology, initial encounter (HCC) [S22.079A] Patient Active Problem List   Diagnosis Date Noted   Generalized weakness 09/19/2024   Acute bronchitis 03/28/2024   Osteoarthritis, multiple sites 02/29/2024   Edema 04/13/2023   Subclinical hypothyroidism 04/13/2023   Nonrheumatic tricuspid valve regurgitation 04/05/2023   Unresponsive episode 03/10/2023   Fatty liver 02/16/2023   Left breast mass 02/02/2023   Abnormal finding on CT scan 01/27/2023   Abnormal TSH 01/27/2023   HTN (hypertension) 01/27/2023   Cognitive impairment 11/17/2022   Prediabetes 09/29/2022   Chronic kidney disease, stage 3a (HCC) 05/12/2022   Advanced nonexudative age-related macular degeneration of left eye with subfoveal involvement 06/30/2021   Advanced nonexudative age-related macular degeneration of right eye without subfoveal involvement 01/13/2020   Serous detachment of retinal pigment epithelium of right eye 01/13/2020   Serous detachment of retinal pigment epithelium of left eye 01/13/2020   Pseudophakia, both eyes 01/13/2020   Loss of consciousness (HCC) 03/26/2019   Essential hypertension 03/26/2019   Actinic keratoses 12/20/2018   Elevated systolic blood pressure reading without diagnosis of hypertension 11/16/2018   Urinary tract infection 10/08/2018   Adynamic ileus (HCC) 10/08/2018   Diarrhea 10/05/2018   GERD (gastroesophageal  reflux disease) 10/05/2018   Urinary hesitancy 10/05/2018   Advanced care planning/counseling discussion 10/05/2018   Hyperlipidemia 09/19/2018   Gait abnormality 09/17/2018   Macular degeneration 09/17/2018   Dry eyes 09/17/2018   History of diverticulitis 09/17/2018   Seizure disorder (HCC) 09/17/2018    Hx of vertebral fracture repair 09/17/2018   Class 1 obesity due to excess calories with body mass index (BMI) of 34.0 to 34.9 in adult 09/17/2018   PCP:  Mast, Man X, NP Pharmacy:   [facility] [place], [place] - [address] [facility] [address] [place] [address] Phone: [phone] Fax: [phone]     Social Drivers of Health (SDOH) Social History: SDOH Screenings   Food Insecurity: No Food Insecurity (09/22/2024)  Housing: Low Risk (09/22/2024)  Transportation Needs: No Transportation Needs (09/22/2024)  Recent Concern: Transportation Needs - Unmet Transportation Needs (07/19/2024)  Utilities: Not At Risk (09/22/2024)  Depression (PHQ2-9): Low Risk (02/29/2024)  Social Connections: Socially Isolated (09/22/2024)  Tobacco Use: Low Risk (09/20/2024)   SDOH Interventions:     Readmission Risk Interventions    03/13/2023   11:23 AM  Readmission Risk Prevention Plan  Post Dischage Appt Complete  Medication Screening Complete  Transportation Screening Complete

## 2024-09-23 NOTE — Plan of Care (Signed)

## 2024-09-23 NOTE — Consult Note (Signed)
 NEUROLOGY CONSULT NOTE   Date of service: September 23, 2024 Patient Name: Katherine Obrien MRN:  [id number] DOB:  09/24/1940 Chief Complaint: Fall Requesting Provider: Arlice Reichert, MD  History of Present Illness  Katherine Obrien is a 84 y.o. female with hx of recent fall at her facility when she says she was knocked down by an automatic door, hypertension, hyperlipidemia, history of seizure in her 30s with last seizure more than 30 years ago and then in 2020 for 3 episodes of unresponsiveness for which she has followed with Dr. Gregg and been on zonisamide , who we have been asked to see for acute stroke. The patient was admitted to the hospital after the fall because of an automatic door hitting her and her complaining of low back pain on 09/20/2023.  She continued to be not feeling very well.  She came to the ER on the same day, was offered pain medications but denied and was discharged home.  She continued to deteriorate per family and was brought in for further evaluation, when head imaging and back imaging was done.  Her back imaging revealed thoracic spine fractures for which she is being considered for kyphoplasty.  Head imaging showed a right PCA subacute infarct which was confirmed later on MRI.  She denies any focal neurological findings. Upon asking her if she has had any speech deficits or slurring of speech, she actually question me and asked me if I ever notice my own speech slurring as it is hard for somebody to know when they are slurring their words.  She was somewhat upset at me being questioning her at this time in the morning but unfortunately I was called to see her after her transfer for stroke consultation and did not want to delay her care for the morning. She did cooperate with some part of the history and exam but was very dismissive on a lot of other answers and I did not get an extremely reliable history.  At this point, her exam was reassuring and I did not really want  to call any family member at 3 AM for further gratification's, which will be done by the morning rounding teams  LKW: Unclear-likely around the time of the fall on 09/19/2024 Modified rankin score: Difficult to ascertain given her unreliable history IV Thrombolysis: Presented outside the window during this admission, not sure if any visual complaints were offered or any focal neurological deficits were noted on the 09/19/2024 visit EVT: Outside the window  NIHSS components Score: Comment  1a Level of Conscious 0[x]  1[]  2[]  3[]      1b LOC Questions 0[x]  1[]  2[]       1c LOC Commands 0[x]  1[]  2[]       2 Best Gaze 0[x]  1[]  2[]       3 Visual 0[]  1[]  2[x]  3[]      4 Facial Palsy 0[x]  1[]  2[]  3[]      5a Motor Arm - left 0[x]  1[]  2[]  3[]  4[]  UN[]    5b Motor Arm - Right 0[x]  1[]  2[]  3[]  4[]  UN[]    6a Motor Leg - Left 0[x]  1[]  2[]  3[]  4[]  UN[]    6b Motor Leg - Right 0[x]  1[]  2[]  3[]  4[]  UN[]    7 Limb Ataxia 0[x]  1[]  2[]  UN[]      8 Sensory 0[x]  1[]  2[]  UN[]      9 Best Language 0[x]  1[]  2[]  3[]      10 Dysarthria 0[]  1[x]  2[]  UN[]      11 Extinct. and Inattention 0[x]  1[]  2[]   TOTAL: 3      ROS  Comprehensive ROS performed with the best of my ability with the caveat of the patient's cooperation  Past History   Past Medical History:  Diagnosis Date   Arthritis    Cancer [facility])    skin   Cataract 2010   Dr. Donn, DrRONITA Lesch @ [facility]   Epilepsy [facility]) 08/11/2015   Hyperlipidemia 12/08/2015   Hypertension    Hypothyroid    Polyp of colon 08/11/2015    Past Surgical History:  Procedure Laterality Date   ABDOMINAL HYSTERECTOMY  1970   APPENDECTOMY  1958   BREAST SURGERY  1984   COLON SURGERY  09/12/2006   FEMUR SURGERY  2018   Broken   TONSILLECTOMY  09/12/1944    Family History: Family History  Problem Relation Age of Onset   Osteoporosis Mother    Dementia Mother    Arthritis Mother    Stroke Father 72       mini   Dementia Father    CVA Father    Breast cancer Sister     Cancer Sister        breast   Dementia Sister        severe   Alzheimer's disease Sister    Diabetes Sister    Hypertension Sister    Stroke Paternal Grandfather        1970    Social History  reports that she has never smoked. She has never used smokeless tobacco. She reports that she does not drink alcohol  and does not use drugs.  Allergies[1]  Medications  Current Medications[2]  Vitals   Vitals:   09/22/24 0447 09/22/24 1500 09/22/24 2041 09/23/24 0000  BP: 133/68 (!) 161/89 (!) 145/77 126/81  Pulse: 83 84 88 87  Resp: 18 16 (!) 21 15  Temp: 98.1 F (36.7 C) 97.6 F (36.4 C) 98.7 F (37.1 C) 98.6 F (37 C)  TempSrc: Oral Oral Oral Axillary  SpO2:   93% 95%  Weight: 89.9 kg     Height: 5' 4.02 (1.626 m)       Body mass index is 34 kg/m.   Physical Exam   Constitutional: Appears well-developed and well-nourished.  Psych: Affect appropriate to situation.  Eyes: No scleral injection.  HENT: No OP obstruction.  Head: Normocephalic.  Cardiovascular: Normal rate and regular rhythm.  Respiratory: Effort normal, non-labored breathing.  GI: Soft.  No distension. There is no tenderness.  Skin: WDI.   Neurologic Examination  Awake alert oriented x 3 Diminished attention concentration Somewhat unhappy at being examined and evaluated at this time of the night No evidence of aphasia Mild dysarthria Cranial nerves II to XII: Pupils equal round react light, mildly disconjugate gaze which is baseline, has macular degeneration and bed visual acuity bilaterally, left homonymous hemianopsia, no gaze restriction or deviation, facial sensation and symmetry preserved, tongue and palate midline. Motor examination with no drift in any of the 4 extremities-lower extremity exam somewhat limited by back pain Sensation intact light touch without extinction No gross dysmetria  Labs/Imaging/Neurodiagnostic studies   CBC:  Recent Labs  Lab 2024/09/26 2218 09/22/24 0601  WBC  12.3* 11.0*  NEUTROABS 8.2*  --   HGB 13.4 12.4  HCT 41.2 39.6  MCV 89.0 90.0  PLT 215 211   Basic Metabolic Panel:  Lab Results  Component Value Date   NA 142 09/22/2024   K 4.3 09/22/2024   CO2 29 09/22/2024   GLUCOSE 120 (H) 09/22/2024  BUN 22 09/22/2024   CREATININE 1.17 (H) 09/22/2024   CALCIUM  9.2 09/22/2024   GFRNONAA 46 (L) 09/22/2024   GFRAA >60 11/05/2019   Lipid Panel:  Lab Results  Component Value Date   LDLCALC 130 (H) 03/05/2024   HgbA1c:  Lab Results  Component Value Date   HGBA1C 6.1 (H) 09/22/2024   Urine Drug Screen: No results found for: LABOPIA, COCAINSCRNUR, LABBENZ, AMPHETMU, THCU, LABBARB  Alcohol  Level No results found for: [facility] INR  Lab Results  Component Value Date   INR 1.0 05/19/2021   APTT No results found for: APTT AED levels:  Lab Results  Component Value Date   PHENYTOIN  <2.5 (L) 01/27/2023    Personally reviewed imaging - CT head without contrast 09/21/2024-right PCA territory encephalomalacia, new since the prior study of 07/18/2024. -MRI brain without contrast: Large subacute right PCA infarct. - CT of the thoracolumbar spine-acute hyperextension injury at T10-11 with fracture and uplifting of the superior T11 endplate and anterior widening of the T10-11 disc space consistent with a T10-T11 B3 hyperextension distraction injury per AO spine classification.  Thoracic ankylosis.  No evidence of acute traumatic injury in the lumbar spine.  Multilevel DJD - CT cervical spine-degenerative disc disease at multiple levels-please refer to the formal report  ASSESSMENT   Doaa Kendzierski is a 84 y.o. female admitted after having a fall and found to have a T10-T11 hyperextension injury/fracture, who has not been at her neurological baseline since the fall on 09/19/2024 and was evaluated with head imaging that revealed a subacute right PCA infarct.  I am unclear if she had the fall because she did not see the automatic door  because of a hemianopsia that was there because of the stroke or if the stroke happened afterwards.  That is hard for me to say.  Upon trying to clarify history from her, she was upset and did not want to participate. In any case, she needs workup for stroke risk factors  Impression: Subacute ischemic right PCA territory infarct  RECOMMENDATIONS  Admit to hospitalist Frequent neurochecks Telemetry CTA head and neck 2D echo A1c Lipid panel Therapy assessments Permissive hypertension is not needed as her last known well is at least 4 days ago. Prophylactic therapy-aspirin  79 for now if-not planning any invasive procedure otherwise when okay with IR. N.p.o. until cleared by bedside swallow evaluation Stroke neurology will follow with you Plan was discussed with the multidisciplinary team at bedside  ______________________________________________________________________    Signed, Eligio Lav, MD [facility]     [1]  Allergies Allergen Reactions   Barbiturates Other (See Comments)    A small amount overdosed the patient- affected my stability and balance (might have been given at the same as another med?)   Cefdinir Other (See Comments)    A small amount overdosed the patient- affected my stability and balance (might have been given at the same as another med?)   Codeine Other (See Comments)    Moderate, per pharmacy   Phenobarbital  Other (See Comments)    A small amount overdosed the patient- affected my stability and balance (might have been given at the same as another med?)   Tegretol  [Carbamazepine ] Other (See Comments)    A small amount overdosed the patient- affected my stability and balance (might have been given at the same as another med?)   Tramadol Other (See Comments)    Moderate, per pharmacy  [2]  Current Facility-Administered Medications:     stroke: early stages of recovery  book, , Does not apply, Once, Dahal, Binaya, MD    acetaminophen  (TYLENOL ) tablet 650 mg, 650 mg, Oral, Q6H PRN **OR** acetaminophen  (TYLENOL ) suppository 650 mg, 650 mg, Rectal, Q6H PRN, Dena Charleston, MD   acetaminophen  (TYLENOL ) tablet 1,000 mg, 1,000 mg, Oral, TID, Dahal, Binaya, MD, 1,000 mg at 09/22/24 2143   enoxaparin  (LOVENOX ) injection 40 mg, 40 mg, Subcutaneous, Q24H, Dorrell, Robert, MD   Muscle Rub CREA, , Topical, BID PRN, Opyd, Timothy S, MD   ondansetron  (ZOFRAN ) tablet 4 mg, 4 mg, Oral, Q6H PRN **OR** ondansetron  (ZOFRAN ) injection 4 mg, 4 mg, Intravenous, Q6H PRN, Dorrell, Robert, MD   oxyCODONE  (Oxy IR/ROXICODONE ) immediate release tablet 5 mg, 5 mg, Oral, Q6H PRN, Dahal, Binaya, MD, 5 mg at 09/23/24 0003   zonisamide  (ZONEGRAN ) capsule 100 mg, 100 mg, Oral, Daily, Dena Charleston, MD

## 2024-09-23 NOTE — Consult Note (Signed)
 "     Chief Complaint: Patient was seen in consultation today for T11 superior endplate fracture  Referring Physician(s): Dr. Chapman Dahal  Supervising Physician: Jenna Hacker  Patient Status: [facility] - In-pt  History of Present Illness: Katherine Obrien is a 84 y.o. female with hx of recent fall at her facility when she says she was knocked down by an automatic door.  She was evaluated in the ED with low back pain and was discharged home with pain medication.  She failed to improve per her family with ongoing malaise, back pain, poor appetite and intake.  She returned to the [facility] ED with further work-up showed a large subacute right PCA infarct.  Additional CT Thoracic Spine showed:  1. Acute hyperextension injury at T10-T11 with fracture and uplifting of the superior T11 endplate and anterior widening of the T10-11 disc space, consistent with a T10-T11 B3 hyperextension distraction injury per AO Spine classification. 2. Thoracic ankylosis  IR consulted for possible vertebroplasty/kyphoplasty at the request of Dr. Arlice.  Patient also undergoing work-up for subacute stroke with Neurology following.     MR Thoracic Spine requested today and reviewed by Dr. Jenna who has approved patient for the procedure given her intractable back pain and poor, life-limiting mobility since the fall.   Past Medical History:  Diagnosis Date   Arthritis    Cancer [facility])    skin   Cataract 2010   Dr. Donn, Dr.. Leverette @ [facility]   Epilepsy [facility]) 08/11/2015   Hyperlipidemia 12/08/2015   Hypertension    Hypothyroid    Polyp of colon 08/11/2015    Past Surgical History:  Procedure Laterality Date   ABDOMINAL HYSTERECTOMY  1970   APPENDECTOMY  1958   BREAST SURGERY  1984   COLON SURGERY  09/12/2006   FEMUR SURGERY  2018   Broken   TONSILLECTOMY  09/12/1944    Allergies: Barbiturates, Cefdinir, Codeine, Phenobarbital , Tegretol  [carbamazepine ], and Tramadol  Medications: Prior to Admission  medications  Medication Sig Start Date End Date Taking? Authorizing Provider  Cholecalciferol (VITAMIN D -3) 25 MCG (1000 UT) CAPS TAKE 1 CAPSULE DAILY WITH BREAKFAST. Patient taking differently: Take 1,000 Units by mouth daily with breakfast. 09/24/19  Yes Mast, Man X, NP  Multiple Vitamins-Minerals (CENTRUM SILVER 50+WOMEN) TABS Take 1 tablet by mouth daily with breakfast.   Yes [provider]  Multiple Vitamins-Minerals (PRESERVISION AREDS 2) CAPS Take 1 capsule by mouth 2 (two) times daily.   Yes [provider]  OVER THE COUNTER MEDICATION Place 1 drop into both eyes See admin instructions. [employer] Advanced Eye Relief Dry Eye Rejuvenation Lubricant Eye Drops - Instill 1 drop into both eyes three times a day as needed for dryness   Yes [provider]  zonisamide  (ZONEGRAN ) 100 MG capsule Take 1 capsule (100 mg total) by mouth at bedtime. 02/19/24 09/22/24 Yes Camara, Pastor, MD  nitrofurantoin , macrocrystal-monohydrate, (MACROBID ) 100 MG capsule Take 1 capsule (100 mg total) by mouth 2 (two) times daily for 7 days. Patient not taking: Reported on 09/22/2024 09/21/24 09/28/24  Mast, Man X, NP  senna-docusate (SENOKOT-S) 8.6-50 MG tablet Take 1 tablet by mouth at bedtime as needed for mild constipation. 07/20/24   [name], MD     Family History  Problem Relation Age of Onset   Osteoporosis Mother    Dementia Mother    Arthritis Mother    Stroke Father 20       mini   Dementia Father  CVA Father    Breast cancer Sister    Cancer Sister        breast   Dementia Sister        severe   Alzheimer's disease Sister    Diabetes Sister    Hypertension Sister    Stroke Paternal Grandfather        1970    Social History   Socioeconomic History   Marital status: Widowed    Spouse name: Not on file   Number of children: 2   Years of education: Not on file   Highest education level: Not on file  Occupational History   Occupation: Contractor     Comment: retired  Tobacco Use   Smoking status: Never   Smokeless tobacco: Never  Vaping Use   Vaping status: Never Used  Substance and Sexual Activity   Alcohol  use: Never   Drug use: Never   Sexual activity: Not Currently  Other Topics Concern   Not on file  Social History Narrative      Right handed   Caffeine- 4 cups      Social History     Socioeconomic History       Marital status: Widowed       Number of children:2       Years of education:  AB in Art [phone])       Highest education level: Bachelors in      Occupational History : Systems Developer strain: Not on file       Food insecurity:         Worry: Not on file         Inability: Not on file       Transportation needs:         Medical: Not on file         Non-medical: Not on file     Tobacco Use       Smoking status: Not on file     Substance and Sexual Activity       Alcohol  use: Not on file       Drug use: Not on file       Sexual activity: Not on file     Lifestyle       Physical activity:         Days per week: Not on file         Minutes per session: A little       Stress: Not on file     Relationships       Social connections:         Talks on phone: Not on file         Gets together: Not on file         Attends religious service: Not on file         Active member of club or organization: Not on file         Attends meetings of clubs or organizations: Not on file         Relationship status: Not on file       Intimate partner violence:         Fear of current or ex partner: Not on file         Emotionally abused: Not on file         Physically  abused: Not on file         Forced sexual activity: Not on file     Other Topics       Concerns:         Not on file     Social History Narrative       Not on file      Social Drivers of Health   Tobacco Use: Low Risk (09/23/2024)   Patient History    Smoking Tobacco Use: Never     Smokeless Tobacco Use: Never    Passive Exposure: Not on file  Financial Resource Strain: Not on file  Food Insecurity: No Food Insecurity (09/22/2024)   Epic    Worried About Programme Researcher, Broadcasting/film/video in the Last Year: Never true    Ran Out of Food in the Last Year: Never true  Transportation Needs: No Transportation Needs (09/22/2024)   Epic    Lack of Transportation (Medical): No    Lack of Transportation (Non-Medical): No  Recent Concern: Transportation Needs - Unmet Transportation Needs (07/19/2024)   Epic    Lack of Transportation (Medical): Yes    Lack of Transportation (Non-Medical): Yes  Physical Activity: Not on file  Stress: Not on file  Social Connections: Socially Isolated (09/22/2024)   Social Connection and Isolation Panel    Frequency of Communication with Friends and Family: Once a week    Frequency of Social Gatherings with Friends and Family: Once a week    Attends Religious Services: Never    Database Administrator or Organizations: Yes    Attends Banker Meetings: 1 to 4 times per year    Marital Status: Widowed  Depression (PHQ2-9): Low Risk (02/29/2024)   Depression (PHQ2-9)    PHQ-2 Score: 0  Alcohol  Screen: Not on file  Housing: Low Risk (09/22/2024)   Epic    Unable to Pay for Housing in the Last Year: No    Number of Times Moved in the Last Year: 0    Homeless in the Last Year: No  Utilities: Not At Risk (09/22/2024)   Epic    Threatened with loss of utilities: No  Health Literacy: Not on file     Review of Systems: A 12 point ROS discussed and pertinent positives are indicated in the HPI above.  All other systems are negative.  Review of Systems  Constitutional:  Negative for fatigue and fever.  Respiratory:  Negative for cough and shortness of breath.   Cardiovascular:  Negative for chest pain.  Gastrointestinal:  Negative for abdominal pain, nausea and vomiting.  Musculoskeletal:  Positive for back pain.  Psychiatric/Behavioral:  Negative  for behavioral problems and confusion.     Vital Signs: BP (!) 114/56 (BP Location: Right Arm)   Pulse 70   Temp (!) 97.5 F (36.4 C) (Oral)   Resp 11   Ht 5' 4.02 (1.626 m)   Wt 198 lb 3.1 oz (89.9 kg)   SpO2 97%   BMI 34.00 kg/m   Physical Exam Vitals and nursing note reviewed.  Constitutional:      General: She is not in acute distress.    Appearance: Normal appearance. She is not ill-appearing.  HENT:     Mouth/Throat:     Mouth: Mucous membranes are moist.     Pharynx: Oropharynx is clear.  Cardiovascular:     Rate and Rhythm: Normal rate and regular rhythm.  Pulmonary:     Effort: Pulmonary effort is normal.  Breath sounds: Normal breath sounds.  Abdominal:     General: Abdomen is flat. There is no distension.     Palpations: Abdomen is soft.  Musculoskeletal:        General: Tenderness (back pain with movement including log roll which wrap around the waist) present.  Skin:    General: Skin is warm and dry.  Neurological:     Mental Status: She is alert.     Motor: Weakness present.      MD Evaluation Airway: WNL Heart: WNL Abdomen: WNL Chest/ Lungs: WNL ASA  Classification: 3 Mallampati/Airway Score: Two   Imaging: MR THORACIC SPINE WO CONTRAST Result Date: 09/23/2024 CLINICAL DATA:  Thoracic compression fracture EXAM: MRI THORACIC SPINE WITHOUT CONTRAST TECHNIQUE: Multiplanar, multisequence MR imaging of the thoracic spine was performed. No intravenous contrast was administered. COMPARISON:  CT 09/21/2024 FINDINGS: Alignment: Normal Bone marrow signal: There is a chronic compression fracture of T12 with about 20-30% loss of vertebral height. There is an acute fracture through the superior endplate of T11. This extends through the left pedicle. There is ankylosis of the thoracic spine that extends from T3-L1. The fracture extends through the syndesmophytes anteriorly. Thoracic spinal cord: Normal Facet joints: No significant abnormality Intervertebral  discs: There is no disc herniation. Paraspinal tissues: There is dependent atelectasis in both lungs with small pleural effusions. IMPRESSION: 1. Chronic benign compression fracture at T12 2. Thoracic ankylosis 3. Acute fracture extending through the anterior syndesmophytes at T10-11, through the superior endplate of T11 and the left pedicle of T11. Electronically Signed   By: Nancyann Burns M.D.   On: 09/23/2024 16:04   CT ANGIO HEAD NECK W WO CM Result Date: 09/23/2024 EXAM: CTA Head and Neck with Intravenous Contrast. CT Head without Contrast. CLINICAL HISTORY: Stroke TECHNIQUE: Axial CTA images of the head and neck performed with intravenous contrast. MIP reconstructed images were created and reviewed. Axial computed tomography images of the head/brain performed without intravenous contrast. Note: Per PQRS, the description of internal carotid artery percent stenosis, including 0 percent or normal exam, is based on North American Symptomatic Carotid Endarterectomy Trial (NASCET) criteria. Dose reduction technique was used including one or more of the following: automated exposure control, adjustment of mA and kV according to patient size, and/or iterative reconstruction. CONTRAST: With; COMPARISON: MRI Yesterday FINDINGS: CT HEAD: BRAIN: Evolving right PCA territory infarct with suspected petechial hemorrhage. No progressive mass effect. No mass lesion. No midline shift or extra-axial collection. VENTRICLES: No hydrocephalus. ORBITS: The orbits are unremarkable. SINUSES AND MASTOIDS: The paranasal sinuses and mastoid air cells are clear. CTA NECK: COMMON CAROTID ARTERIES: No significant stenosis. No dissection or occlusion. INTERNAL CAROTID ARTERIES: Approximately 60% stenosis of the proximal right ICA due to atherosclerosis. No dissection or occlusion. VERTEBRAL ARTERIES: Severe right vertebral artery origin stenosis. Left vertebral artery is patent without greater than 50% stenosis. CTA HEAD: ANTERIOR CEREBRAL  ARTERIES: Hypoplastic right A1 ACA. No significant stenosis. No occlusion. No aneurysm. MIDDLE CEREBRAL ARTERIES: Severe left M2 MCA stenosis. No occlusion. No aneurysm. POSTERIOR CEREBRAL ARTERIES: Occluded right P2 PCA. Severe left P2 PCA stenosis. No aneurysm. BASILAR ARTERY: No significant stenosis. No occlusion. No aneurysm. OTHER: SOFT TISSUES: No acute finding. No masses or lymphadenopathy. BONES: No acute osseous abnormality. Other: Findings will be communicated to the clinical team by a radiology assistant and documented in PACS/Clario. IMPRESSION: 1. Occluded right P2 PCA. 2. Severe left P2 PCA stenosis. 3. Severe left M2 MCA stenosis. 4. Severe right vertebral artery origin stenosis.  5. Approximately 60% stenosis of the proximal right ICA in the neck. 6. Evolving right PCA territory infarct with suspected petechial hemorrhage. No progressive mass effect. Electronically signed by: Gilmore Molt MD MD 09/23/2024 02:46 PM EST RP Workstation: [id number]   ECHOCARDIOGRAM COMPLETE Result Date: 09/23/2024    ECHOCARDIOGRAM REPORT   Patient Name:   Katherine Obrien Date of Exam: 09/23/2024 Medical Rec #:  [id number]          Height:       64.0 in Accession #:    [id number]         Weight:       198.2 lb Date of Birth:  [birth year]         BSA:          1.949 m Patient Age:    83 years           BP:           150/74 mmHg Patient Gender: F                  HR:           79 bpm. Exam Location:  Inpatient Procedure: 2D Echo, Cardiac Doppler, Color Doppler and Intracardiac            Opacification Agent (Both Spectral and Color Flow Doppler were            utilized during procedure). Indications:    Stroke  History:        Patient has prior history of Echocardiogram examinations, most                 recent 01/31/2023. Risk Factors:Hypertension and Dyslipidemia.  Sonographer:    Sherlean Dubin Referring Phys: BINAYA DAHAL IMPRESSIONS  1. Left ventricular ejection fraction, by estimation, is 55 to 60%. The left  ventricle has normal function. The left ventricle has no regional wall motion abnormalities. There is mild concentric left ventricular hypertrophy. Left ventricular diastolic parameters are consistent with Grade I diastolic dysfunction (impaired relaxation).  2. Right ventricular systolic function is normal. The right ventricular size is normal. There is normal pulmonary artery systolic pressure.  3. Left atrial size was mildly dilated.  4. The mitral valve was not well visualized. No evidence of mitral valve regurgitation. Severe mitral annular calcification.  5. The aortic valve is grossly normal. There is moderate calcification of the aortic valve. There is moderate thickening of the aortic valve. Aortic valve regurgitation is not visualized. Aortic valve sclerosis/calcification is present, without any evidence of aortic stenosis.  6. The inferior vena cava is normal in size with <50% respiratory variability, suggesting right atrial pressure of 8 mmHg. Comparison(s): No significant change from prior study. Conclusion(s)/Recommendation(s): Technically challenging study, valves not well visualized. If there is clinical concern for embolic etiology of stroke, would consider TEE for better evaluation. FINDINGS  Left Ventricle: Left ventricular ejection fraction, by estimation, is 55 to 60%. The left ventricle has normal function. The left ventricle has no regional wall motion abnormalities. Definity  contrast agent was given IV to delineate the left ventricular  endocardial borders. The left ventricular internal cavity size was normal in size. There is mild concentric left ventricular hypertrophy. Left ventricular diastolic parameters are consistent with Grade I diastolic dysfunction (impaired relaxation). Right Ventricle: The right ventricular size is normal. Right vetricular wall thickness was not well visualized. Right ventricular systolic function is normal. There is normal pulmonary artery systolic pressure. The  tricuspid regurgitant velocity is 1.30  m/s, and with an assumed right atrial pressure of 8 mmHg, the estimated right ventricular systolic pressure is 14.8 mmHg. Left Atrium: Left atrial size was mildly dilated. Right Atrium: Right atrial size was normal in size. Pericardium: There is no evidence of pericardial effusion. Mitral Valve: The mitral valve was not well visualized. Severe mitral annular calcification. No evidence of mitral valve regurgitation. MV peak gradient, 6.6 mmHg. The mean mitral valve gradient is 3.0 mmHg. Tricuspid Valve: The tricuspid valve is not well visualized. Tricuspid valve regurgitation is trivial. No evidence of tricuspid stenosis. Aortic Valve: The aortic valve is grossly normal. There is moderate calcification of the aortic valve. There is moderate thickening of the aortic valve. Aortic valve regurgitation is not visualized. Aortic valve sclerosis/calcification is present, without any evidence of aortic stenosis. Aortic valve mean gradient measures 3.0 mmHg. Aortic valve peak gradient measures 6.1 mmHg. Aortic valve area, by VTI measures 2.50 cm. Pulmonic Valve: The pulmonic valve was not well visualized. Pulmonic valve regurgitation is not visualized. No evidence of pulmonic stenosis. Aorta: The aortic root is normal in size and structure, the aortic arch was not well visualized and the ascending aorta was not well visualized. Venous: The inferior vena cava is normal in size with less than 50% respiratory variability, suggesting right atrial pressure of 8 mmHg. IAS/Shunts: The atrial septum is grossly normal.  LEFT VENTRICLE PLAX 2D LVIDd:         3.66 cm   Diastology LVIDs:         2.60 cm   LV e' medial:    4.35 cm/s LV PW:         1.15 cm   LV E/e' medial:  17.8 LV IVS:        1.20 cm   LV e' lateral:   4.46 cm/s LVOT diam:     2.00 cm   LV E/e' lateral: 17.4 LV SV:         62 LV SV Index:   32 LVOT Area:     3.14 cm  RIGHT VENTRICLE             IVC RV S prime:     15.40 cm/s  IVC  diam: 1.80 cm TAPSE (M-mode): 1.8 cm LEFT ATRIUM             Index        RIGHT ATRIUM           Index LA diam:        2.70 cm 1.39 cm/m   RA Area:     17.00 cm LA Vol (A2C):   69.8 ml 35.82 ml/m  RA Volume:   48.10 ml  24.68 ml/m LA Vol (A4C):   51.1 ml 26.22 ml/m LA Biplane Vol: 58.9 ml 30.22 ml/m  AORTIC VALVE AV Area (Vmax):    2.36 cm AV Area (Vmean):   2.51 cm AV Area (VTI):     2.50 cm AV Vmax:           123.00 cm/s AV Vmean:          80.650 cm/s AV VTI:            0.248 m AV Peak Grad:      6.1 mmHg AV Mean Grad:      3.0 mmHg LVOT Vmax:         92.50 cm/s LVOT Vmean:        64.450 cm/s LVOT VTI:  0.198 m LVOT/AV VTI ratio: 0.80  AORTA Ao Root diam: 2.80 cm MITRAL VALVE                TRICUSPID VALVE MV Area (PHT): 3.85 cm     TR Peak grad:   6.8 mmHg MV Area VTI:   2.92 cm     TR Vmax:        130.00 cm/s MV Peak grad:  6.6 mmHg MV Mean grad:  3.0 mmHg     SHUNTS MV Vmax:       1.28 m/s     Systemic VTI:  0.20 m MV Vmean:      78.2 cm/s    Systemic Diam: 2.00 cm MV Decel Time: 197 msec MV E velocity: 77.60 cm/s MV A velocity: 128.00 cm/s MV E/A ratio:  0.61 Shelda Bruckner MD Electronically signed by Shelda Bruckner MD Signature Date/Time: 09/23/2024/11:36:32 AM    Final    MR BRAIN WO CONTRAST Result Date: 09/22/2024 EXAM: MRI BRAIN WITHOUT CONTRAST 09/22/2024 11:18:41 AM TECHNIQUE: Multiplanar multisequence MRI of the head/brain was performed without the administration of intravenous contrast. COMPARISON: Head CT 09/21/2024. CLINICAL HISTORY: Stroke. FINDINGS: BRAIN AND VENTRICLES: A large subacute right PCA infarct is again seen with associated restricted diffusion, cytotoxic edema, cortical laminar necrosis, and likely minimal petechial blood products. No mass, midline shift, extra-axial fluid collection, or hydrocephalus is evident. T2 hyperintensities in the cerebral white matter bilaterally are nonspecific but compatible with mild chronic small vessel ischemic disease.  There is mild generalized cerebral atrophy. Major intracranial vascular flow voids are preserved. ORBITS: Bilateral cataract extraction. SINUSES AND MASTOIDS: Minimal mucosal thickening in the paranasal sinuses. Clear mastoid air cells. BONES AND SOFT TISSUES: Normal marrow signal. No significant soft tissue abnormality. IMPRESSION: 1. Large subacute right PCA infarct. 2. Mild chronic small vessel ischemic disease. Electronically signed by: Dasie Hamburg MD MD 09/22/2024 11:49 AM EST RP Workstation: [id number]   CT CERVICAL SPINE WO CONTRAST Result Date: 09/21/2024 EXAM: CT CERVICAL SPINE WITHOUT CONTRAST 09/21/2024 11:27:03 PM TECHNIQUE: CT of the cervical spine was performed without the administration of intravenous contrast. Multiplanar reformatted images are provided for review. Automated exposure control, iterative reconstruction, and/or weight based adjustment of the mA/kV was utilized to reduce the radiation dose to as low as reasonably achievable. COMPARISON: None available. CLINICAL HISTORY: fall FINDINGS: BONES AND ALIGNMENT: No acute fracture or traumatic malalignment. DEGENERATIVE CHANGES: Degenerative disc disease is greatest at C5-C6, where there is endplate sclerosis and uncovertebral spurring. Lytic atherosclerosis of the facet. No high-grade cervical spinal canal stenosis. SOFT TISSUES: No prevertebral soft tissue swelling. IMPRESSION: 1. Degenerative disc disease greatest at C5-C6 with endplate sclerosis and uncovertebral spurring. 2. No high-grade cervical spinal canal stenosis. Electronically signed by: Franky Stanford MD MD 09/21/2024 11:45 PM EST RP Workstation: [id number]   CT Thoracic Spine Wo Contrast Result Date: 09/21/2024 EXAM: CT THORACIC SPINE WITHOUT CONTRAST 09/21/2024 11:27:03 PM TECHNIQUE: CT of the thoracic spine was performed without the administration of intravenous contrast. Multiplanar reformatted images are provided for review. Automated exposure control, iterative  reconstruction, and/or weight based adjustment of the mA/kV was utilized to reduce the radiation dose to as low as reasonably achievable. COMPARISON: None available. CLINICAL HISTORY: Mid-back pain. FINDINGS: BONES AND ALIGNMENT: Vertebral body heights are maintained except for the superior T11 endplate, which is fractured and uplifted. At T10-T11, there is evidence of acute hyperextension injury with anterior widening of the T10-T11 disc space. There is no visible osseous extension of the fracture into  the posterior elements. According to the AO Spine classification of thoracolumbar injuries, the finding is consistent with a T10-T11: B3 hyperextension distraction injury (with associated anterior endplate fracture at T11) and the recommendation is not specified by this descriptive classification alone. No suspicious bone lesion. DEGENERATIVE CHANGES: There are flowing anterior syndesmophytes along the length of the thoracic spine. SOFT TISSUES: No acute abnormality. IMPRESSION: 1. Acute hyperextension injury at T10-T11 with fracture and uplifting of the superior T11 endplate and anterior widening of the T10-11 disc space, consistent with a T10-T11 B3 hyperextension distraction injury per AO Spine classification. 2. Thoracic ankylosis Electronically signed by: Franky Stanford MD MD 09/21/2024 11:40 PM EST RP Workstation: [id number]   CT Lumbar Spine Wo Contrast Result Date: 09/21/2024 EXAM: CT OF THE LUMBAR SPINE WITHOUT CONTRAST 09/21/2024 11:27:03 PM TECHNIQUE: CT of the lumbar spine was performed without the administration of intravenous contrast. Multiplanar reformatted images are provided for review. Automated exposure control, iterative reconstruction, and/or weight based adjustment of the mA/kV was utilized to reduce the radiation dose to as low as reasonably achievable. COMPARISON: None available. CLINICAL HISTORY: Low back pain, trauma. FINDINGS: BONES AND ALIGNMENT: Normal vertebral body heights. No acute  fracture or suspicious bone lesion. Grade 1 retrolisthesis at L2-L3 and L3-L4. Lumbar levoscoliosis with apex at L4. DEGENERATIVE CHANGES: Multilevel degenerative disc disease with disc space narrowing and mild endplate remodeling. Multilevel severe lower lumbar facet arthrosis. Moderate spinal canal stenosis at L3-L4, L4-L5, and L5-S1. Moderate bilateral L5 neural foraminal stenosis. SOFT TISSUES: Calcific aortic sclerosis. Cholelithiasis. No acute abnormality. IMPRESSION: 1. No evidence of acute traumatic injury. 2. Moderate spinal canal stenosis at L3-L4, L4-L5, and L5-S1. 3. Moderate bilateral L5 neural foraminal stenosis. Electronically signed by: Franky Stanford MD MD 09/21/2024 11:35 PM EST RP Workstation: [id number]   CT HEAD WO CONTRAST ( ) Result Date: 09/21/2024 EXAM: CT HEAD WITHOUT CONTRAST 09/21/2024 11:27:03 PM TECHNIQUE: CT of the head was performed without the administration of intravenous contrast. Automated exposure control, iterative reconstruction, and/or weight based adjustment of the mA/kV was utilized to reduce the radiation dose to as low as reasonably achievable. COMPARISON: 07/18/2024 CLINICAL HISTORY: Delirium FINDINGS: BRAIN AND VENTRICLES: No acute hemorrhage. No evidence of acute infarct. Since the prior study, encephalomalacia has developed in the right PCA territory. This appears late subacute. MRI would be helpful for better temporal characterization. No hydrocephalus. No extra-axial collection. No mass effect or midline shift. ORBITS: No acute abnormality. SINUSES: No acute abnormality. SOFT TISSUES AND SKULL: No acute soft tissue abnormality. No skull fracture. IMPRESSION: 1. 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 2. MRI would be helpful for more precise temporal characterization. Electronically signed by: Franky Stanford MD MD 09/21/2024 11:32 PM EST RP Workstation: [id number]   DG Thoracic Spine 2 View Result  Date: 09/19/2024 EXAM: 2 VIEW(S) XRAY OF THE THORACIC SPINE 09/19/2024 06:50:00 PM COMPARISON: None available. CLINICAL HISTORY: Fall. FINDINGS: BONES: Vertebral body heights are maintained. Alignment is normal. DISCS AND DEGENERATIVE CHANGES: Mild degenerative changes of the lower thoracic / upper lumbar spine. SOFT TISSUES: The visualized lungs are clear. IMPRESSION: 1. No acute findings. Electronically signed by: Pinkie Pebbles MD MD 09/19/2024 07:09 PM EST RP Workstation: [id number]   DG Lumbar Spine Complete Result Date: 09/19/2024 EXAM: 4 OR MORE VIEW(S) XRAY OF THE LUMBAR SPINE 09/19/2024 06:50:00 PM COMPARISON: None available. CLINICAL HISTORY: fall FINDINGS: LUMBAR SPINE: BONES: Vertebral body heights are maintained. Alignment: Mild upper lumbar dextroscoliosis. Mild superior endplate compression fracture  deformity at T12, chronic. DISCS AND DEGENERATIVE CHANGES: Mild multilevel degenerative changes, most prominent at L1-L2 and L5-S1. SOFT TISSUES: No acute abnormality. IMPRESSION: 1. No acute findings. Electronically signed by: Pinkie Pebbles MD MD 09/19/2024 07:09 PM EST RP Workstation: [id number]    Labs:  CBC: Recent Labs    07/19/24 0422 07/20/24 0616 09/21/24 2218 09/22/24 0601  WBC 17.8* 11.2* 12.3* 11.0*  HGB 12.5 12.0 13.4 12.4  HCT 40.8 39.2 41.2 39.6  PLT 240 213 215 211    COAGS: No results for input(s): INR, APTT in the last 8760 hours.  BMP: Recent Labs    07/18/24 1719 07/19/24 0422 09/21/24 2218 09/22/24 0601  NA 141 137 139 142  K 4.3 4.2 3.7 4.3  CL 105 102 104 105  CO2 26 24 26 29   GLUCOSE 117* 114* 105* 120*  BUN 31* 25* 24* 22  CALCIUM  9.8 9.0 9.1 9.2  CREATININE 1.24* 0.92 1.00 1.17*  GFRNONAA 42* >60 55* 46*    LIVER FUNCTION TESTS: Recent Labs    03/05/24 0709 07/18/24 1719 09/21/24 2218  BILITOT 0.3 0.2 0.4  AST 14 36 21  ALT 15 25 14   ALKPHOS  --  125 115  PROT 6.9 7.7 7.3  ALBUMIN  --  3.8 3.6    TUMOR MARKERS: No  results for input(s): AFPTM, CEA, CA199, CHROMGRNA in the last 8760 hours.  Assessment and Plan: T11 superior endplate fracture Patient with recent traumatic back injury which may be related to her recent stroke with hemianopsia onset.   MR Thoracic Spine further delineates the presence of a superior endplate T11 fracture through the left pedicle of T11.  Case reviewed by Dr. Jenna who approves patient for vertebroplasty.  Discussed with patient and her family at bedside who would like to discuss with out of town family prior to proceeding/consenting.   Maintain heparin  for now until procedure confirmed.  Dr. Eliazar next available procedure date would Friday at the earliest.  Family and team aware.    Thank you for this interesting consult.  I greatly enjoyed meeting Katherine Obrien and look forward to participating in their care.  A copy of this report was sent to the requesting provider on this date.  Electronically Signed: Kiyonna Tortorelli Sue-Ellen Tiffanee Mcnee, PA 09/23/2024, 4:50 PM   I spent a total of 55 Miinutes    in face to face in clinical consultation, greater than 50% of which was counseling/coordinating care for T11 superior endplate fracture.  "

## 2024-09-24 ENCOUNTER — Other Ambulatory Visit: Payer: Self-pay | Admitting: Cardiology

## 2024-09-24 DIAGNOSIS — R29702 NIHSS score 2: Secondary | ICD-10-CM

## 2024-09-24 DIAGNOSIS — I739 Peripheral vascular disease, unspecified: Secondary | ICD-10-CM | POA: Diagnosis not present

## 2024-09-24 DIAGNOSIS — I63531 Cerebral infarction due to unspecified occlusion or stenosis of right posterior cerebral artery: Secondary | ICD-10-CM | POA: Diagnosis not present

## 2024-09-24 DIAGNOSIS — I10 Essential (primary) hypertension: Secondary | ICD-10-CM

## 2024-09-24 DIAGNOSIS — R569 Unspecified convulsions: Secondary | ICD-10-CM | POA: Diagnosis not present

## 2024-09-24 DIAGNOSIS — I639 Cerebral infarction, unspecified: Secondary | ICD-10-CM

## 2024-09-24 DIAGNOSIS — R4182 Altered mental status, unspecified: Secondary | ICD-10-CM | POA: Diagnosis not present

## 2024-09-24 DIAGNOSIS — E785 Hyperlipidemia, unspecified: Secondary | ICD-10-CM | POA: Diagnosis not present

## 2024-09-24 DIAGNOSIS — W19XXXA Unspecified fall, initial encounter: Secondary | ICD-10-CM | POA: Diagnosis not present

## 2024-09-24 DIAGNOSIS — I779 Disorder of arteries and arterioles, unspecified: Secondary | ICD-10-CM

## 2024-09-24 LAB — BASIC METABOLIC PANEL WITH GFR
Anion gap: 9 (ref 5–15)
BUN: 19 mg/dL (ref 8–23)
CO2: 29 mmol/L (ref 22–32)
Calcium: 9.2 mg/dL (ref 8.9–10.3)
Chloride: 100 mmol/L (ref 98–111)
Creatinine, Ser: 0.88 mg/dL (ref 0.44–1.00)
GFR, Estimated: >60 mL/min
Glucose, Bld: 92 mg/dL (ref 70–99)
Potassium: 4.2 mmol/L (ref 3.5–5.1)
Sodium: 137 mmol/L (ref 135–145)

## 2024-09-24 LAB — PROTIME-INR
INR: 1 (ref 0.8–1.2)
Prothrombin Time: 14 s (ref 11.4–15.2)

## 2024-09-24 LAB — MAGNESIUM: Magnesium: 2 mg/dL (ref 1.7–2.4)

## 2024-09-24 MED ORDER — CEFAZOLIN SODIUM-DEXTROSE 2-4 GM/100ML-% IV SOLN
2.0000 g | INTRAVENOUS | Status: AC
  Administered 2024-09-25: 2 g via INTRAVENOUS
  Filled 2024-09-24: qty 100

## 2024-09-24 NOTE — Progress Notes (Signed)
 "                                                                                                                                                                                                                                                                                PROGRESS NOTE     Patient Demographics:    Katherine Obrien, is a 84 y.o. female, DOB - 10-31-1940, FMW:[id number]  Outpatient Primary MD for the patient is Mast, Man X, NP    LOS - 2  Admit date - 09/21/2024    No chief complaint on file.      Brief Narrative (HPI from H&P)   84 y.o. female with PMH significant for HTN, HLD, hypothyroidism, arthritis, epilepsy. Patient lives at [facility] independent living facility. 1/8, patient had a witnessed fall at the facility.  She was going through an automatic door when it closed at her, knocked her over on her back.  Did not hit her head but he started having back pain since then.  She was seen in the ED.  Underwent x-ray of thoracic and lumbar spine which did not show any acute findings and was hence discharged back to facility.  Patient continued to have pain, also started to have confusion and hence she was brought back to ED on 1/10.  In the ER workup suggestive of acute T10/T11 spine fracture, large subacute right PCA infarct without any correlating focal deficits, UTI along with mild metabolic encephalopathy and she was admitted to the hospital.   Subjective:   Patient in bed, appears comfortable, denies any headache, no fever, no chest pain or pressure, no shortness of breath , no abdominal pain. No focal weakness.  Back pain, appears to be in no distress today.   Assessment  & Plan :   Acute back injury with MRI of the T-spine confirming Acute fracture extending through the anterior syndesmophytes at T10-11, through the superior endplate of T11 and the left pedicle of T11 - Had a fall on her back on 1/8, CT of the spine suggests acute hyperextension of T10-T11 with  fracture, getting supportive care, IR has been consulted for possible kyphoplasty which might happen on 09/27/2024, continue  supportive care for now.  TLSO brace with activity and commence PT OT.  IR to inform us  about aspirin  start date, discussed with them on 09/23/2024 and 09/24/2024.  They will let us  know.   Large subacute right PCA infarct - MRI brain showed a large subacute right PCA infarct.  No new focal deficits except L peripheral vision loss, seen by neurology initially in the ER, full stroke workup, aspirin  once cleared by IR, low-dose statin has been started, LDL slightly above goal, A1c is acceptable will have PCP monitor, CTA head and echocardiogram noted, echo unremarkable CTA head has diffuse disease, aspirin  as above, seen by neurology discussed with Dr. Rosemarie on 09/23/2024 and 09/24/2024.  IR has been informed to give us  aspirin  and start date whenever they feel comfortable, she will also need 30 day monitor upon DC.  UTI Urinalysis showed hazy yellow urine with large leukocytes, pending, 3 doses of Levaquin  oral if patient accepts, has multiple allergies.  Acute metabolic encephalopathy Secondary to UTI, pain, improved continue to monitor.  History of seizure disorder continue Zonegran       Condition - Extremely Guarded  Family Communication  :   Called and left a message at son's cell phone on 09/23/2024 around 8 AM. Daughter [phone] [name]  updated on her cell phone on 09/23/2024, updated again on 04/24/2025  Code Status : Full code  Consults  : Neurology, IR  PUD Prophylaxis :     Procedures  :     TTE -  1. Left ventricular ejection fraction, by estimation, is 55 to 60%. The left ventricle has normal function. The left ventricle has no regional wall motion abnormalities. There is mild concentric left ventricular hypertrophy. Left ventricular diastolic parameters are consistent with Grade I diastolic dysfunction (impaired relaxation).  2. Right  ventricular systolic function is normal. The right ventricular size is normal. There is normal pulmonary artery systolic pressure.  3. Left atrial size was mildly dilated.  4. The mitral valve was not well visualized. No evidence of mitral valve regurgitation. Severe mitral annular calcification.  5. The aortic valve is grossly normal. There is moderate calcification of the aortic valve. There is moderate thickening of the aortic valve. Aortic valve regurgitation is not visualized. Aortic valve sclerosis/calcification is present, without any evidence of aortic stenosis.  6. The inferior vena cava is normal in size with <50% respiratory variability, suggesting right atrial pressure of 8 mmHg.   MRI T-spine. 1. Chronic benign compression fracture at T12 2. Thoracic ankylosis 3. Acute fracture extending through the anterior syndesmophytes at T10-11, through the superior endplate of T11 and the left pedicle of T11.  CTA head and neck.  1. Occluded right P2 PCA. 2. Severe left P2 PCA stenosis. 3. Severe left M2 MCA stenosis. 4. Severe right vertebral artery origin stenosis. 5. Approximately 60% stenosis of the proximal right ICA in the neck. 6. Evolving right PCA territory infarct with suspected petechial hemorrhage. No progressive mass effect.  MRI brain.  1. Large subacute right PCA infarct. 2. Mild chronic small vessel ischemic disease   CT head and neck.  Nonacute.  CT T-spine.   1. Acute hyperextension injury at T10-T11 with fracture and uplifting of the superior T11 endplate and anterior widening of the T10-11 disc space, consistent with a T10-T11 B3 hyperextension distraction injury per AO Spine classification. 2. Thoracic ankylosis  CT L-spine. 1. No evidence of acute traumatic injury. 2. Moderate spinal canal stenosis at L3-L4, L4-L5, and L5-S1. 3.  Moderate bilateral L5 neural foraminal stenosis.      Disposition Plan  :    Status is: Inpatient  DVT Prophylaxis  :    heparin  injection 5,000  Units Start: 09/23/24 1715 SCDs Start: 09/22/24 0059    Lab Results  Component Value Date   PLT 211 09/22/2024    Diet :  Diet Order             Diet regular Room service appropriate? Yes; Fluid consistency: Thin  Diet effective now                    Inpatient Medications  Scheduled Meds:  acetaminophen   1,000 mg Oral TID   heparin  injection (subcutaneous)  5,000 Units Subcutaneous Q8H   levofloxacin   500 mg Oral QODAY   rosuvastatin   20 mg Oral Daily   zonisamide   100 mg Oral Daily   Continuous Infusions:   PRN Meds:.acetaminophen  **OR** acetaminophen , Muscle Rub, ondansetron  **OR** ondansetron  (ZOFRAN ) IV, oxyCODONE   Antibiotics  :    Anti-infectives (From admission, onward)    Start     Dose/Rate Route Frequency Ordered Stop   09/23/24 0630  levofloxacin  (LEVAQUIN ) tablet 500 mg        500 mg Oral Every other day 09/23/24 0537 09/27/24 0959   09/22/24 0115  fosfomycin  (MONUROL ) packet 3 g        3 g Oral  Once 09/22/24 0106 09/22/24 0123         Objective:   Vitals:   09/23/24 1218 09/23/24 2059 09/24/24 0000 09/24/24 0400  BP: (!) 114/56 139/71 101/60 136/68  Pulse: 70 80 69 74  Resp: 11 20 13 15   Temp: (!) 97.5 F (36.4 C) 98.1 F (36.7 C) 98.1 F (36.7 C) 98.1 F (36.7 C)  TempSrc: Oral Oral Oral Oral  SpO2: 97% 97% 96% 95%  Weight:      Height:        Wt Readings from Last 3 Encounters:  09/22/24 89.9 kg  09/19/24 90.4 kg  07/18/24 91.4 kg     Intake/Output Summary (Last 24 hours) at 09/24/2024 0826 Last data filed at 09/24/2024 0400 Gross per 24 hour  Intake --  Output 350 ml  Net -350 ml     Physical Exam  Awake Alert, No new F.N deficits, Normal affect [place].AT,PERRAL Supple Neck, No JVD,   Symmetrical Chest wall movement, Good air movement bilaterally, CTAB RRR,No Gallops,Rubs or new Murmurs,  +ve B.Sounds, Abd Soft, No tenderness,   No Cyanosis, Clubbing or edema       Data Review:    Recent Labs  Lab  09/21/24 2218 09/22/24 0601  WBC 12.3* 11.0*  HGB 13.4 12.4  HCT 41.2 39.6  PLT 215 211  MCV 89.0 90.0  MCH 28.9 28.2  MCHC 32.5 31.3  RDW 14.9 14.7  LYMPHSABS 2.3  --   MONOABS 1.2*  --   EOSABS 0.5  --   BASOSABS 0.1  --     Recent Labs  Lab 09/21/24 2218 09/22/24 0600 09/22/24 0601 09/24/24 0339  NA 139  --  142 137  K 3.7  --  4.3 4.2  CL 104  --  105 100  CO2 26  --  29 29  ANIONGAP 10  --  8 9  GLUCOSE 105*  --  120* 92  BUN 24*  --  22 19  CREATININE 1.00  --  1.17* 0.88  AST 21  --   --   --  ALT 14  --   --   --   ALKPHOS 115  --   --   --   BILITOT 0.4  --   --   --   ALBUMIN 3.6  --   --   --   INR  --   --   --  1.0  HGBA1C  --  6.1*  --   --   MG  --   --   --  2.0  CALCIUM  9.1  --  9.2 9.2      Recent Labs  Lab 09/21/24 2218 09/22/24 0600 09/22/24 0601 09/24/24 0339  INR  --   --   --  1.0  HGBA1C  --  6.1*  --   --   MG  --   --   --  2.0  CALCIUM  9.1  --  9.2 9.2    --------------------------------------------------------------------------------------------------------------- Lab Results  Component Value Date   CHOL 173 09/23/2024   HDL 47 09/23/2024   LDLCALC 108 (H) 09/23/2024   TRIG 88 09/23/2024   CHOLHDL 3.7 09/23/2024    Lab Results  Component Value Date   HGBA1C 6.1 (H) 09/22/2024   No results for input(s): TSH, T4TOTAL, FREET4, T3FREE, THYROIDAB in the last 72 hours. No results for input(s): VITAMINB12, FOLATE, FERRITIN, TIBC, IRON, RETICCTPCT in the last 72 hours. ------------------------------------------------------------------------------------------------------------------ Cardiac Enzymes No results for input(s): CKMB, TROPONINI, MYOGLOBIN in the last 168 hours.  Invalid input(s): CK  Micro Results Recent Results (from the past 240 hours)  Urine Culture     Status: Abnormal   Collection Time: 09/21/24 11:56 PM   Specimen: Urine, Clean Catch  Result Value Ref Range Status    Specimen Description   Final    URINE, CLEAN CATCH Performed at [facility], [address] [place], [place] [address]    Special Requests   Final    NONE Performed at [facility], [address], [place], [place] [address]    Culture MULTIPLE SPECIES PRESENT, SUGGEST RECOLLECTION (A)  Final   Report Status 09/23/2024 FINAL  Final    Radiology Report MR THORACIC SPINE WO CONTRAST Result Date: 09/23/2024 CLINICAL DATA:  Thoracic compression fracture EXAM: MRI THORACIC SPINE WITHOUT CONTRAST TECHNIQUE: Multiplanar, multisequence MR imaging of the thoracic spine was performed. No intravenous contrast was administered. COMPARISON:  CT 09/21/2024 FINDINGS: Alignment: Normal Bone marrow signal: There is a chronic compression fracture of T12 with about 20-30% loss of vertebral height. There is an acute fracture through the superior endplate of T11. This extends through the left pedicle. There is ankylosis of the thoracic spine that extends from T3-L1. The fracture extends through the syndesmophytes anteriorly. Thoracic spinal cord: Normal Facet joints: No significant abnormality Intervertebral discs: There is no disc herniation. Paraspinal tissues: There is dependent atelectasis in both lungs with small pleural effusions. IMPRESSION: 1. Chronic benign compression fracture at T12 2. Thoracic ankylosis 3. Acute fracture extending through the anterior syndesmophytes at T10-11, through the superior endplate of T11 and the left pedicle of T11. Electronically Signed   By: Nancyann Burns M.D.   On: 09/23/2024 16:04   CT ANGIO HEAD NECK W WO CM Result Date: 09/23/2024 EXAM: CTA Head and Neck with Intravenous Contrast. CT Head without Contrast. CLINICAL HISTORY: Stroke TECHNIQUE: Axial CTA images of the head and neck performed with intravenous contrast. MIP reconstructed images were created and reviewed. Axial computed tomography images of the head/brain performed without  intravenous contrast. Note:  Per PQRS, the description of internal carotid artery percent stenosis, including 0 percent or normal exam, is based on [employer] American Symptomatic Carotid Endarterectomy Trial (NASCET) criteria. Dose reduction technique was used including one or more of the following: automated exposure control, adjustment of mA and kV according to patient size, and/or iterative reconstruction. CONTRAST: With; COMPARISON: MRI Yesterday FINDINGS: CT HEAD: BRAIN: Evolving right PCA territory infarct with suspected petechial hemorrhage. No progressive mass effect. No mass lesion. No midline shift or extra-axial collection. VENTRICLES: No hydrocephalus. ORBITS: The orbits are unremarkable. SINUSES AND MASTOIDS: The paranasal sinuses and mastoid air cells are clear. CTA NECK: COMMON CAROTID ARTERIES: No significant stenosis. No dissection or occlusion. INTERNAL CAROTID ARTERIES: Approximately 60% stenosis of the proximal right ICA due to atherosclerosis. No dissection or occlusion. VERTEBRAL ARTERIES: Severe right vertebral artery origin stenosis. Left vertebral artery is patent without greater than 50% stenosis. CTA HEAD: ANTERIOR CEREBRAL ARTERIES: Hypoplastic right A1 ACA. No significant stenosis. No occlusion. No aneurysm. MIDDLE CEREBRAL ARTERIES: Severe left M2 MCA stenosis. No occlusion. No aneurysm. POSTERIOR CEREBRAL ARTERIES: Occluded right P2 PCA. Severe left P2 PCA stenosis. No aneurysm. BASILAR ARTERY: No significant stenosis. No occlusion. No aneurysm. OTHER: SOFT TISSUES: No acute finding. No masses or lymphadenopathy. BONES: No acute osseous abnormality. Other: Findings will be communicated to the clinical team by a radiology assistant and documented in PACS/Clario. IMPRESSION: 1. Occluded right P2 PCA. 2. Severe left P2 PCA stenosis. 3. Severe left M2 MCA stenosis. 4. Severe right vertebral artery origin stenosis. 5. Approximately 60% stenosis of the proximal right ICA in the neck. 6. Evolving  right PCA territory infarct with suspected petechial hemorrhage. No progressive mass effect. Electronically signed by: Gilmore Molt MD MD 09/23/2024 02:46 PM EST RP Workstation: [id number]   ECHOCARDIOGRAM COMPLETE Result Date: 09/23/2024    ECHOCARDIOGRAM REPORT   Patient Name:   [name] Console Date of Exam: 09/23/2024 Medical Rec #:  [id number]          Height:       64.0 in Accession #:    [id number]         Weight:       198.2 lb Date of Birth:  10/19/1940         BSA:          1.949 m Patient Age:    83 years           BP:           150/74 mmHg Patient Gender: F                  HR:           79 bpm. Exam Location:  Inpatient Procedure: 2D Echo, Cardiac Doppler, Color Doppler and Intracardiac            Opacification Agent (Both Spectral and Color Flow Doppler were            utilized during procedure). Indications:    Stroke  History:        Patient has prior history of Echocardiogram examinations, most                 recent 01/31/2023. Risk Factors:Hypertension and Dyslipidemia.  Sonographer:    Sherlean Dubin Referring Phys: BINAYA DAHAL IMPRESSIONS  1. Left ventricular ejection fraction, by estimation, is 55 to 60%. The left ventricle has normal function. The left ventricle has no regional wall motion abnormalities. There is mild concentric left ventricular hypertrophy. Left ventricular  diastolic parameters are consistent with Grade I diastolic dysfunction (impaired relaxation).  2. Right ventricular systolic function is normal. The right ventricular size is normal. There is normal pulmonary artery systolic pressure.  3. Left atrial size was mildly dilated.  4. The mitral valve was not well visualized. No evidence of mitral valve regurgitation. Severe mitral annular calcification.  5. The aortic valve is grossly normal. There is moderate calcification of the aortic valve. There is moderate thickening of the aortic valve. Aortic valve regurgitation is not visualized. Aortic valve  sclerosis/calcification is present, without any evidence of aortic stenosis.  6. The inferior vena cava is normal in size with <50% respiratory variability, suggesting right atrial pressure of 8 mmHg. Comparison(s): No significant change from prior study. Conclusion(s)/Recommendation(s): Technically challenging study, valves not well visualized. If there is clinical concern for embolic etiology of stroke, would consider TEE for better evaluation. FINDINGS  Left Ventricle: Left ventricular ejection fraction, by estimation, is 55 to 60%. The left ventricle has normal function. The left ventricle has no regional wall motion abnormalities. Definity  contrast agent was given IV to delineate the left ventricular  endocardial borders. The left ventricular internal cavity size was normal in size. There is mild concentric left ventricular hypertrophy. Left ventricular diastolic parameters are consistent with Grade I diastolic dysfunction (impaired relaxation). Right Ventricle: The right ventricular size is normal. Right vetricular wall thickness was not well visualized. Right ventricular systolic function is normal. There is normal pulmonary artery systolic pressure. The tricuspid regurgitant velocity is 1.30 m/s, and with an assumed right atrial pressure of 8 mmHg, the estimated right ventricular systolic pressure is 14.8 mmHg. Left Atrium: Left atrial size was mildly dilated. Right Atrium: Right atrial size was normal in size. Pericardium: There is no evidence of pericardial effusion. Mitral Valve: The mitral valve was not well visualized. Severe mitral annular calcification. No evidence of mitral valve regurgitation. MV peak gradient, 6.6 mmHg. The mean mitral valve gradient is 3.0 mmHg. Tricuspid Valve: The tricuspid valve is not well visualized. Tricuspid valve regurgitation is trivial. No evidence of tricuspid stenosis. Aortic Valve: The aortic valve is grossly normal. There is moderate calcification of the aortic  valve. There is moderate thickening of the aortic valve. Aortic valve regurgitation is not visualized. Aortic valve sclerosis/calcification is present, without any evidence of aortic stenosis. Aortic valve mean gradient measures 3.0 mmHg. Aortic valve peak gradient measures 6.1 mmHg. Aortic valve area, by VTI measures 2.50 cm. Pulmonic Valve: The pulmonic valve was not well visualized. Pulmonic valve regurgitation is not visualized. No evidence of pulmonic stenosis. Aorta: The aortic root is normal in size and structure, the aortic arch was not well visualized and the ascending aorta was not well visualized. Venous: The inferior vena cava is normal in size with less than 50% respiratory variability, suggesting right atrial pressure of 8 mmHg. IAS/Shunts: The atrial septum is grossly normal.  LEFT VENTRICLE PLAX 2D LVIDd:         3.66 cm   Diastology LVIDs:         2.60 cm   LV e' medial:    4.35 cm/s LV PW:         1.15 cm   LV E/e' medial:  17.8 LV IVS:        1.20 cm   LV e' lateral:   4.46 cm/s LVOT diam:     2.00 cm   LV E/e' lateral: 17.4 LV SV:         62 LV  SV Index:   32 LVOT Area:     3.14 cm  RIGHT VENTRICLE             IVC RV S prime:     15.40 cm/s  IVC diam: 1.80 cm TAPSE (M-mode): 1.8 cm LEFT ATRIUM             Index        RIGHT ATRIUM           Index LA diam:        2.70 cm 1.39 cm/m   RA Area:     17.00 cm LA Vol (A2C):   69.8 ml 35.82 ml/m  RA Volume:   48.10 ml  24.68 ml/m LA Vol (A4C):   51.1 ml 26.22 ml/m LA Biplane Vol: 58.9 ml 30.22 ml/m  AORTIC VALVE AV Area (Vmax):    2.36 cm AV Area (Vmean):   2.51 cm AV Area (VTI):     2.50 cm AV Vmax:           123.00 cm/s AV Vmean:          80.650 cm/s AV VTI:            0.248 m AV Peak Grad:      6.1 mmHg AV Mean Grad:      3.0 mmHg LVOT Vmax:         92.50 cm/s LVOT Vmean:        64.450 cm/s LVOT VTI:          0.198 m LVOT/AV VTI ratio: 0.80  AORTA Ao Root diam: 2.80 cm MITRAL VALVE                TRICUSPID VALVE MV Area (PHT): 3.85 cm      TR Peak grad:   6.8 mmHg MV Area VTI:   2.92 cm     TR Vmax:        130.00 cm/s MV Peak grad:  6.6 mmHg MV Mean grad:  3.0 mmHg     SHUNTS MV Vmax:       1.28 m/s     Systemic VTI:  0.20 m MV Vmean:      78.2 cm/s    Systemic Diam: 2.00 cm MV Decel Time: 197 msec MV E velocity: 77.60 cm/s MV A velocity: 128.00 cm/s MV E/A ratio:  0.61 Shelda Bruckner MD Electronically signed by Shelda Bruckner MD Signature Date/Time: 09/23/2024/11:36:32 AM    Final    MR BRAIN WO CONTRAST Result Date: 09/22/2024 EXAM: MRI BRAIN WITHOUT CONTRAST 09/22/2024 11:18:41 AM TECHNIQUE: Multiplanar multisequence MRI of the head/brain was performed without the administration of intravenous contrast. COMPARISON: Head CT 09/21/2024. CLINICAL HISTORY: Stroke. FINDINGS: BRAIN AND VENTRICLES: A large subacute right PCA infarct is again seen with associated restricted diffusion, cytotoxic edema, cortical laminar necrosis, and likely minimal petechial blood products. No mass, midline shift, extra-axial fluid collection, or hydrocephalus is evident. T2 hyperintensities in the cerebral white matter bilaterally are nonspecific but compatible with mild chronic small vessel ischemic disease. There is mild generalized cerebral atrophy. Major intracranial vascular flow voids are preserved. ORBITS: Bilateral cataract extraction. SINUSES AND MASTOIDS: Minimal mucosal thickening in the paranasal sinuses. Clear mastoid air cells. BONES AND SOFT TISSUES: Normal marrow signal. No significant soft tissue abnormality. IMPRESSION: 1. Large subacute right PCA infarct. 2. Mild chronic small vessel ischemic disease. Electronically signed by: Dasie Hamburg MD MD 09/22/2024 11:49 AM EST RP Workstation: [id number]     Signature  -  Lavada Stank M.D on 09/24/2024 at 8:26 AM   -  To page go to [email or web address]   "

## 2024-09-24 NOTE — Plan of Care (Signed)
 " Problem: Education: Goal: Knowledge of General Education information will improve Description: Including pain rating scale, medication(s)/side effects and non-pharmacologic comfort measures 09/24/2024 0426 by Hanley Clem KIDD, RN Outcome: Progressing 09/23/2024 1928 by Hanley Clem KIDD, RN Outcome: Progressing   Problem: Health Behavior/Discharge Planning: Goal: Ability to manage health-related needs will improve 09/24/2024 0426 by Hanley Clem KIDD, RN Outcome: Progressing 09/23/2024 1928 by Hanley Clem KIDD, RN Outcome: Progressing   Problem: Clinical Measurements: Goal: Ability to maintain clinical measurements within normal limits will improve 09/24/2024 0426 by Hanley Clem KIDD, RN Outcome: Progressing 09/23/2024 1928 by Hanley Clem KIDD, RN Outcome: Progressing Goal: Will remain free from infection 09/24/2024 0426 by Hanley Clem KIDD, RN Outcome: Progressing 09/23/2024 1928 by Hanley Clem KIDD, RN Outcome: Progressing Goal: Diagnostic test results will improve 09/24/2024 0426 by Hanley Clem KIDD, RN Outcome: Progressing 09/23/2024 1928 by Hanley Clem KIDD, RN Outcome: Progressing Goal: Respiratory complications will improve 09/24/2024 0426 by Hanley Clem KIDD, RN Outcome: Progressing 09/23/2024 1928 by Hanley Clem KIDD, RN Outcome: Progressing Goal: Cardiovascular complication will be avoided 09/24/2024 0426 by Hanley Clem KIDD, RN Outcome: Progressing 09/23/2024 1928 by Hanley Clem KIDD, RN Outcome: Progressing   Problem: Activity: Goal: Risk for activity intolerance will decrease 09/24/2024 0426 by Hanley Clem KIDD, RN Outcome: Progressing 09/23/2024 1928 by Hanley Clem KIDD, RN Outcome: Progressing   Problem: Nutrition: Goal: Adequate nutrition will be maintained 09/24/2024 0426 by Hanley Clem KIDD, RN Outcome: Progressing 09/23/2024 1928 by Hanley Clem KIDD, RN Outcome: Progressing   Problem: Coping: Goal: Level of anxiety will decrease 09/24/2024 0426 by Hanley Clem KIDD, RN Outcome:  Progressing 09/23/2024 1928 by Hanley Clem KIDD, RN Outcome: Progressing   Problem: Elimination: Goal: Will not experience complications related to bowel motility 09/24/2024 0426 by Hanley Clem KIDD, RN Outcome: Progressing 09/23/2024 1928 by Hanley Clem KIDD, RN Outcome: Progressing Goal: Will not experience complications related to urinary retention 09/24/2024 0426 by Hanley Clem KIDD, RN Outcome: Progressing 09/23/2024 1928 by Hanley Clem KIDD, RN Outcome: Progressing   Problem: Pain Managment: Goal: General experience of comfort will improve and/or be controlled 09/24/2024 0426 by Hanley Clem KIDD, RN Outcome: Progressing 09/23/2024 1928 by Hanley Clem KIDD, RN Outcome: Progressing   Problem: Safety: Goal: Ability to remain free from injury will improve 09/24/2024 0426 by Hanley Clem KIDD, RN Outcome: Progressing 09/23/2024 1928 by Hanley Clem KIDD, RN Outcome: Progressing   Problem: Skin Integrity: Goal: Risk for impaired skin integrity will decrease 09/24/2024 0426 by Hanley Clem KIDD, RN Outcome: Progressing 09/23/2024 1928 by Hanley Clem KIDD, RN Outcome: Progressing   Problem: Education: Goal: Knowledge of disease or condition will improve 09/24/2024 0426 by Hanley Clem KIDD, RN Outcome: Progressing 09/23/2024 1928 by Hanley Clem KIDD, RN Outcome: Progressing Goal: Knowledge of secondary prevention will improve (MUST DOCUMENT ALL) 09/24/2024 0426 by Hanley Clem KIDD, RN Outcome: Progressing 09/23/2024 1928 by Hanley Clem KIDD, RN Outcome: Progressing Goal: Knowledge of patient specific risk factors will improve (DELETE if not current risk factor) 09/24/2024 0426 by Hanley Clem KIDD, RN Outcome: Progressing 09/23/2024 1928 by Hanley Clem KIDD, RN Outcome: Progressing   Problem: Ischemic Stroke/TIA Tissue Perfusion: Goal: Complications of ischemic stroke/TIA will be minimized 09/24/2024 0426 by Hanley Clem KIDD, RN Outcome: Progressing 09/23/2024 1928 by Hanley Clem KIDD, RN Outcome:  Progressing   Problem: Coping: Goal: Will verbalize positive feelings about self 09/24/2024 0426 by Hanley Clem KIDD, RN Outcome: Progressing 09/23/2024 1928 by Hanley Clem KIDD, RN Outcome: Progressing Goal: Will identify appropriate support needs  09/24/2024 0426 by Hanley Clem KIDD, RN Outcome: Progressing 09/23/2024 1928 by Hanley Clem KIDD, RN Outcome: Progressing   Problem: Health Behavior/Discharge Planning: Goal: Ability to manage health-related needs will improve 09/24/2024 0426 by Hanley Clem KIDD, RN Outcome: Progressing 09/23/2024 1928 by Hanley Clem KIDD, RN Outcome: Progressing Goal: Goals will be collaboratively established with patient/family 09/24/2024 0426 by Hanley Clem KIDD, RN Outcome: Progressing 09/23/2024 1928 by Hanley Clem KIDD, RN Outcome: Progressing   Problem: Self-Care: Goal: Ability to participate in self-care as condition permits will improve 09/24/2024 0426 by Hanley Clem KIDD, RN Outcome: Progressing 09/23/2024 1928 by Hanley Clem KIDD, RN Outcome: Progressing Goal: Verbalization of feelings and concerns over difficulty with self-care will improve 09/24/2024 0426 by Hanley Clem KIDD, RN Outcome: Progressing 09/23/2024 1928 by Hanley Clem KIDD, RN Outcome: Progressing Goal: Ability to communicate needs accurately will improve 09/24/2024 0426 by Hanley Clem KIDD, RN Outcome: Progressing 09/23/2024 1928 by Hanley Clem KIDD, RN Outcome: Progressing   Problem: Nutrition: Goal: Risk of aspiration will decrease 09/24/2024 0426 by Hanley Clem KIDD, RN Outcome: Progressing 09/23/2024 1928 by Hanley Clem KIDD, RN Outcome: Progressing Goal: Dietary intake will improve 09/24/2024 0426 by Hanley Clem KIDD, RN Outcome: Progressing 09/23/2024 1928 by Hanley Clem KIDD, RN Outcome: Progressing   "

## 2024-09-24 NOTE — Evaluation (Signed)
 Occupational Therapy Evaluation Patient Details Name: Katherine Obrien MRN: [id number] DOB: [birth year] Today's Date: 09/24/2024   History of Present Illness   84 y.o. female recently in ED 09/19/24 with back pain after fall, now admitted from [facility] on 09/21/24 due to inability to care for self at home. Workup revealed UTI, acute hyperextension T10-T11 with fx. MRI 1/11 showed large subacute R PCA infarct. Plan for kyphoplasty 1/16. PMH includes seizure disorder, HTN, CKDIII.     Clinical Impressions PTA Pt reports that she was Mod I with RW for functional transfers and independent with ADLs. Pt currently requires up to Max A +2 for functional mobility and up to total A for ADL tasks. Pt primarily limited by pain, generalized weakness, decreased activity tolerance, and decreased knowledge of AE/DME. OT to continue to follow Pt acutely to facilitate progress towards goals. Patient will benefit from continued inpatient follow up therapy, <3 hours/day.      If plan is discharge home, recommend the following:   Two people to help with walking and/or transfers;A lot of help with bathing/dressing/bathroom;Assistance with cooking/housework;Assist for transportation;Help with stairs or ramp for entrance     Functional Status Assessment   Patient has had a recent decline in their functional status and demonstrates the ability to make significant improvements in function in a reasonable and predictable amount of time.     Equipment Recommendations   Other (comment) (defer)     Recommendations for Other Services         Precautions/Restrictions   Precautions Precautions: Fall Recall of Precautions/Restrictions: Impaired Precaution/Restrictions Comments: BLT precautions followed for safety and Pt comfort Required Braces or Orthoses: Spinal Brace Spinal Brace: Thoracolumbosacral orthotic Restrictions Weight Bearing Restrictions Per Provider Order: No     Mobility  Bed Mobility Overal bed mobility: Needs Assistance Bed Mobility: Supine to Sit, Sit to Supine     Supine to sit: Mod assist, +2 for safety/equipment, +2 for physical assistance, HOB elevated, Used rails Sit to supine: Max assist, +2 for physical assistance, +2 for safety/equipment   General bed mobility comments: Mod A to come to EOB, Pt with fair management of BLE off of bed, required increased assistance to elevate trunk from [facility] and scoot towards edge. Max A required to return to supine    Transfers Overall transfer level: Needs assistance Equipment used: Rolling walker (2 wheels) Transfers: Sit to/from Stand Sit to Stand: Mod assist, +2 physical assistance, +2 safety/equipment, From elevated surface           General transfer comment: Mod A +2 sit to stand, Pt with decreased activity tolerance and unable to support self in standing for >2 minutes. Pt unable to fully extend trunk and neck.      Balance Overall balance assessment: Needs assistance Sitting-balance support: Bilateral upper extremity supported, Single extremity supported, Feet supported Sitting balance-Leahy Scale: Poor Sitting balance - Comments: Requires support from at least one UE   Standing balance support: Bilateral upper extremity supported, During functional activity, Reliant on assistive device for balance Standing balance-Leahy Scale: Zero Standing balance comment: Dependent on RW and external support                           ADL either performed or assessed with clinical judgement   ADL Overall ADL's : Needs assistance/impaired Eating/Feeding: Set up;Sitting   Grooming: Minimal assistance;Sitting   Upper Body Bathing: Moderate assistance   Lower Body Bathing: Total assistance  Upper Body Dressing : Moderate assistance   Lower Body Dressing: Total assistance   Toilet Transfer: Maximal assistance;+2 for physical assistance;+2 for safety/equipment;BSC/3in1   Toileting- Clothing  Manipulation and Hygiene: Total assistance               Vision Patient Visual Report: No change from baseline       Perception         Praxis         Pertinent Vitals/Pain Pain Assessment Pain Assessment: Faces Faces Pain Scale: Hurts whole lot Pain Location: abdomen Pain Descriptors / Indicators: Discomfort, Grimacing, Guarding, Moaning Pain Intervention(s): Limited activity within patient's tolerance, Monitored during session, Repositioned     Extremity/Trunk Assessment Upper Extremity Assessment RUE Deficits / Details: Decreased shoulder ROM, Pt endorses is baseline and functional for her everyday tasks LUE Deficits / Details: Decreased shoulder ROM, Pt endorses is baseline and functional for her everyday tasks       Cervical / Trunk Assessment Cervical / Trunk Assessment: Other exceptions Cervical / Trunk Exceptions: T10-T11 fx   Communication Communication Communication: Impaired Factors Affecting Communication: Hearing impaired   Cognition Arousal: Alert Behavior During Therapy: Flat affect Cognition: Cognition impaired     Awareness: Online awareness impaired Memory impairment (select all impairments): Working memory Attention impairment (select first level of impairment): Sustained attention Executive functioning impairment (select all impairments): Organization, Sequencing, Reasoning, Problem solving OT - Cognition Comments: Cognitive impairments observed during session, increased processing time required throughout session. Pt often required repeated instructions of task.                 Following commands: Impaired Following commands impaired: Follows one step commands inconsistently, Follows one step commands with increased time     Cueing  General Comments   Cueing Techniques: Verbal cues;Visual cues;Gestural cues      Exercises     Shoulder Instructions      Home Living Family/patient expects to be discharged to:: Private  residence Living Arrangements: Alone   Type of Home: Independent living facility Home Access: Level entry     Home Layout: One level     Bathroom Shower/Tub: Walk-in shower         Home Equipment: Grab bars - tub/shower;Grab bars - toilet;Shower seat;Rolling Environmental Consultant (2 wheels)   Additional Comments: Resides at Independent Living at [facility].      Prior Functioning/Environment Prior Level of Function : Independent/Modified Independent;History of Falls (last six months)             Mobility Comments: Uses RW for mobility ADLs Comments: Reports she was independent with ADLs. Would go to dining room for meals.    OT Problem List: Decreased strength;Decreased activity tolerance;Impaired balance (sitting and/or standing);Decreased safety awareness;Decreased knowledge of use of DME or AE;Decreased knowledge of precautions;Pain   OT Treatment/Interventions: Self-care/ADL training;Therapeutic exercise;DME and/or AE instruction;Therapeutic activities;Patient/family education;Balance training      OT Goals(Current goals can be found in the care plan section)   Acute Rehab OT Goals OT Goal Formulation: With patient Time For Goal Achievement: 10/08/24 Potential to Achieve Goals: Good ADL Goals Pt Will Perform Upper Body Dressing: with supervision;sitting Pt Will Perform Lower Body Dressing: with mod assist;with adaptive equipment;sitting/lateral leans Pt Will Transfer to Toilet: with min assist;stand pivot transfer;bedside commode Additional ADL Goal #1: Pt will verbalize 3/3 back precautions without assistance.   OT Frequency:  Min 2X/week    Co-evaluation              AM-PAC  OT 6 Clicks Daily Activity     Outcome Measure Help from another person eating meals?: A Little Help from another person taking care of personal grooming?: A Little Help from another person toileting, which includes using toliet, bedpan, or urinal?: Total Help from another person  bathing (including washing, rinsing, drying)?: A Lot Help from another person to put on and taking off regular upper body clothing?: A Lot Help from another person to put on and taking off regular lower body clothing?: Total 6 Click Score: 12   End of Session Equipment Utilized During Treatment: Rolling walker (2 wheels);Back brace  Activity Tolerance: Patient tolerated treatment well Patient left: in bed;with call bell/phone within reach;with bed alarm set;with family/visitor present  OT Visit Diagnosis: Unsteadiness on feet (R26.81);Muscle weakness (generalized) (M62.81);History of falling (Z91.81);Pain                Time: 1103-1140 OT Time Calculation (min): 37 min Charges:  OT General Charges $OT Visit: 1 Visit OT Evaluation $OT Eval Low Complexity: 1 Low  Maurilio CROME, OTR/L.  [facility]  Office: [phone]   Maurilio PARAS Dylen Mcelhannon 09/24/2024, 1:17 PM

## 2024-09-24 NOTE — Progress Notes (Signed)
 30d monitor ordered for stroke.

## 2024-09-24 NOTE — Progress Notes (Signed)
 IR consulted for T11 kyphoplasty. Please see full consult note 09/23/24. At the time of consult patient requested to discuss the procedure further with her family prior to signing consent.   Patient agreeable today to move forward with procedure. The procedure was discussed again at the bedside with the patient and her family. Procedure planned 09/25/24 with Dr. Hughes.   Risks and benefits of T11 kyphoplasty/vertebroplasty were discussed with the patient including, but not limited to education regarding the natural healing process of compression fractures without intervention, bleeding, infection, cement migration which may cause spinal cord damage, paralysis, pulmonary embolism or even death.  Consent signed and in IR.   Patient will be NPO at midnight. Morning labs (CBC, PT/INR) ordered. Subcutaneous heparin  will be held two doses.    Katherine Obrien, AGACNP-BC 09/24/2024, 3:40 PM

## 2024-09-24 NOTE — Plan of Care (Signed)

## 2024-09-24 NOTE — Evaluation (Signed)
 Physical Therapy Evaluation Patient Details Name: Katherine Obrien MRN: [id number] DOB: [birth year] Today's Date: 09/24/2024  History of Present Illness  84 y.o. female recently in ED 09/19/24 with back pain after fall, now admitted from [facility] on 09/21/24 due to inability to care for self at home. Workup revealed UTI, acute hyperextension T10-T11 with fx. MRI 1/11 showed large subacute R PCA infarct. Potential for kyphoplasty. PMH includes seizure disorder, HTN, CKDIII.   Clinical Impression  Pt is currently mobilizing below her baseline due to recent fall with T10-11 fx and associated pain. TLSO delivered prior to session, benefits of orthotic devics and donning/doffing sequence reviewed with patient. TLSO donned in sitting. Pt requires modAx2 for bed mobility and completes in segments due to pain. Log roll technique reviewed with patient, modified version completed due to pt having difficulty tolerating flat bed at this time. Pt tolerates sitting EOB ~8 minutes, progressing from modA trunk control to CGA with both feet placed on the ground. STS completed with modAx2 and walker, pt demonstrates significant forward posture that she is able to partially correct but not maintain. Pt would benefit from continued PT services focused on bed mobility, posture, TLSO donning/doffing training, and transfers in order to promote tolerate to upright mobility.        If plan is discharge home, recommend the following: Two people to help with walking and/or transfers;Two people to help with bathing/dressing/bathroom;Assistance with cooking/housework;Direct supervision/assist for medications management;Direct supervision/assist for financial management;Assist for transportation;Help with stairs or ramp for entrance   Can travel by private vehicle   No    Equipment Recommendations Wheelchair (measurements PT);Wheelchair cushion (measurements PT) (Pt has appropriate equipment at facility)   Recommendations for Other Services       Functional Status Assessment Patient has had a recent decline in their functional status and demonstrates the ability to make significant improvements in function in a reasonable and predictable amount of time.     Precautions / Restrictions Precautions Precautions: Fall Recall of Precautions/Restrictions: Impaired Precaution/Restrictions Comments: BLT precautions followed for safety and Pt comfort Required Braces or Orthoses: Spinal Brace Spinal Brace: Thoracolumbosacral orthotic Restrictions Weight Bearing Restrictions Per Provider Order: No      Mobility  Bed Mobility Overal bed mobility: Needs Assistance Bed Mobility: Supine to Sit, Sit to Supine     Supine to sit: Mod assist, +2 for safety/equipment, +2 for physical assistance, HOB elevated, Used rails Sit to supine: Max assist, +2 for physical assistance, +2 for safety/equipment   General bed mobility comments: Mod A to come to EOB, Pt with fair management of BLE off of bed, required increased assistance to elevate trunk from [facility] and scoot towards edge. Max A required to return to supine. Anywhere from CGA to modA for trunk support EOB. Pt completes transfer in segments due to pain.    Transfers Overall transfer level: Needs assistance Equipment used: Rolling walker (2 wheels) Transfers: Sit to/from Stand Sit to Stand: Mod assist, +2 physical assistance, +2 safety/equipment, From elevated surface           General transfer comment: Mod A +2 sit to stand, Pt with decreased activity tolerance and unable to support self in standing for >2 minutes. Pt unable to fully extend trunk and neck, bent forward at ~70 degree angle, corrects partially with cues but cannot maintain.    Ambulation/Gait               General Gait Details: Pt unable to take steps at this  time.  Careers Information Officer     Tilt Bed    Modified Rankin (Stroke Patients Only)        Balance Overall balance assessment: Needs assistance Sitting-balance support: Bilateral upper extremity supported, Single extremity supported, Feet supported Sitting balance-Leahy Scale: Poor Sitting balance - Comments: Requires support from at least one UE, CGA-modA trunk support sitting EOB due to back pain, frequently requesting to lean back into PT for support.   Standing balance support: Bilateral upper extremity supported, During functional activity, Reliant on assistive device for balance Standing balance-Leahy Scale: Zero Standing balance comment: Dependent on RW and external support. Unable to demonstrate upright posture.                             Pertinent Vitals/Pain Pain Assessment Faces Pain Scale: Hurts whole lot Pain Location: Abdomen Pain Descriptors / Indicators: Aching, Tightness, Sore Pain Intervention(s): Limited activity within patient's tolerance, Repositioned, Monitored during session    Home Living Family/patient expects to be discharged to:: Private residence Living Arrangements: Alone   Type of Home: Independent living facility (Family and pt have been discussing need to transition to ALF) Home Access: Level entry       Home Layout: One level Home Equipment: Grab bars - tub/shower;Grab bars - toilet;Shower seat;Rolling Environmental Consultant (2 wheels) Additional Comments: Resides at Independent [facility] at [facility].    Prior Function Prior Level of Function : Independent/Modified Independent;History of Falls (last six months)             Mobility Comments: Uses RW for mobility ADLs Comments: Reports she was independent with ADLs. Would go to dining room for meals.     Extremity/Trunk Assessment   Upper Extremity Assessment Upper Extremity Assessment: Defer to OT evaluation RUE Deficits / Details: Decreased shoulder ROM, Pt endorses is baseline and functional for her everyday tasks LUE Deficits / Details: Decreased shoulder ROM, Pt  endorses is baseline and functional for her everyday tasks    Lower Extremity Assessment Lower Extremity Assessment: RLE deficits/detail;LLE deficits/detail RLE Deficits / Details: Difficulty tolerating overpressure for MMT. Sensation WNL. Able to perform dorsiflexion within full range, knee extension and hip flexion through partial range. Equal bilaterally. LLE Deficits / Details: Difficulty tolerating overpressure for MMT. Sensation WNL. Able to perform dorsiflexion within full range, knee extension and hip flexion through partial range. Equal bilaterally.    Cervical / Trunk Assessment Cervical / Trunk Assessment: Other exceptions;Kyphotic Cervical / Trunk Exceptions: T10-11 fx  Communication   Communication Communication: Impaired Factors Affecting Communication: Hearing impaired    Cognition Arousal: Alert Behavior During Therapy: Flat affect   PT - Cognitive impairments: Sequencing, Problem solving, Awareness                       PT - Cognition Comments: Increased processing time noted. Could have partially been due to [facility]. Following commands: Impaired Following commands impaired: Follows one step commands inconsistently, Follows one step commands with increased time     Cueing Cueing Techniques: Verbal cues, Visual cues, Gestural cues     General Comments General comments (skin integrity, edema, etc.): VSS throughout. No significant skin abnormalities noted.    Exercises     Assessment/Plan    PT Assessment Patient needs continued PT services  PT Problem List Decreased strength;Decreased mobility;Decreased safety awareness;Decreased range of motion;Decreased coordination;Decreased knowledge of precautions;Decreased activity tolerance;Decreased  skin integrity;Decreased balance;Decreased knowledge of use of DME;Pain       PT Treatment Interventions DME instruction;Therapeutic exercise;Gait training;Balance training;Wheelchair mobility training;Stair  training;Neuromuscular re-education;Functional mobility training;Therapeutic activities;Patient/family education;Other (comment) (Orthotic training)    PT Goals (Current goals can be found in the Care Plan section)  Acute Rehab PT Goals Patient Stated Goal: Decrease pain PT Goal Formulation: With patient/family Time For Goal Achievement: 10/08/24 Potential to Achieve Goals: Good Additional Goals Additional Goal #1: Pt will demonstrate appropriate donning/doffing technqiue of TLSO with modA in order to demonstrate safe management with functional mobility.    Frequency Min 2X/week     Co-evaluation PT/OT/SLP Co-Evaluation/Treatment: Yes Reason for Co-Treatment: Complexity of the patient's impairments (multi-system involvement);To address functional/ADL transfers PT goals addressed during session: Mobility/safety with mobility;Balance;Proper use of DME OT goals addressed during session: ADL's and self-care;Proper use of Adaptive equipment and DME       AM-PAC PT 6 Clicks Mobility  Outcome Measure Help needed turning from your back to your side while in a flat bed without using bedrails?: A Lot Help needed moving from lying on your back to sitting on the side of a flat bed without using bedrails?: A Lot Help needed moving to and from a bed to a chair (including a wheelchair)?: Total Help needed standing up from a chair using your arms (e.g., wheelchair or bedside chair)?: A Lot Help needed to walk in hospital room?: Total Help needed climbing 3-5 steps with a railing? : Total 6 Click Score: 9    End of Session Equipment Utilized During Treatment: Oxygen;Back brace Activity Tolerance: Patient limited by pain;Patient limited by fatigue Patient left: in bed;with call bell/phone within reach;with bed alarm set;with family/visitor present Nurse Communication: Mobility status;Precautions PT Visit Diagnosis: Muscle weakness (generalized) (M62.81);Unsteadiness on feet (R26.81);Other  abnormalities of gait and mobility (R26.89)    Time: 1103-1140 PT Time Calculation (min) (ACUTE ONLY): 37 min   Charges:   PT Evaluation $PT Eval High Complexity: 1 High PT Treatments $Therapeutic Activity: 8-22 mins PT General Charges $$ ACUTE PT VISIT: 1 Visit         Sabra Morel, PT, DPT  Acute Rehabilitation Services         Office: [phone]     Sabra MARLA Morel 09/24/2024, 3:55 PM

## 2024-09-24 NOTE — Progress Notes (Signed)
 Orthopedic Tech Progress Note Patient Details:  Katherine Obrien 02-Feb-1941 [id number] Applied and fitted TLSO to patient, removed Ortho Devices Type of Ortho Device: Thoracolumbar corset (TLSO) Ortho Device/Splint Location: TLSO Ortho Device/Splint Interventions: Ordered, Application, Adjustment, Removal   Post Interventions Patient Tolerated: Katherine Obrien 09/24/2024, 10:10 AM

## 2024-09-24 NOTE — Discharge Instructions (Signed)
 To address social isolation:  Education Officer, [employer]: [phone] / [address] [place], [place] [address]  -Dial 988: Talk lifeline 24/7.  -[place]  - [facility] Warmline: [phone]  -Enroll in PACE program (Program of All-Inclusive Care for the Elderly): a Medicare and Medicaid initiative that provides comprehensive medical and social services to frail seniors (55+) who need nursing home-level care but prefer to stay in their communities, allowing them to age in place with support like primary care, therapy, meals, and transportation, coordinated by an interdisciplinary team. If you have both Medicare (for people 65+) and Medicaid (income-tested), then you may pay nothing. People without Medicare or Medicaid pay a monthly premium. The amount of this premium depends on your healthcare and financial needs. Long-term care insurance may pay for your PACE care. This coverage is determined by your insurer. Enrollment Information: (336) [phone] Office: [phone]

## 2024-09-24 NOTE — Progress Notes (Signed)
 STROKE TEAM PROGRESS NOTE    INTERIM HISTORY/SUBJECTIVE  Patient has remained hemodynamically stable and afebrile overnight.  She continues to have left hemianopsia.  Her daughter is at the bedside.  She states that patient had called her a few days ago and appeared to be confused and thought may have had a stroke.  Did not seek medical help at that time.  MRI scan shows subacute right PCA infarct.  CT angiogram and echocardiogram are pending.  Patient had prior outpatient cardiac monitoring studies which were negative.  She will likely need a loop recorder at discharge.  CT angiogram shows occluded right P2 PCA and severe left P2 PCA and left M2 as well as severe right vertebral artery origin stenosis. Patient is scheduled to undergo kyphoplasty later today by interventional radiology in the next few days. OBJECTIVE  CBC    Component Value Date/Time   WBC 11.0 (H) 09/22/2024 0601   RBC 4.40 09/22/2024 0601   HGB 12.4 09/22/2024 0601   HCT 39.6 09/22/2024 0601   PLT 211 09/22/2024 0601   MCV 90.0 09/22/2024 0601   MCH 28.2 09/22/2024 0601   MCHC 31.3 09/22/2024 0601   RDW 14.7 09/22/2024 0601   LYMPHSABS 2.3 09/21/2024 2218   MONOABS 1.2 (H) 09/21/2024 2218   EOSABS 0.5 09/21/2024 2218   BASOSABS 0.1 09/21/2024 2218    BMET    Component Value Date/Time   NA 137 09/24/2024 0339   NA 141 10/19/2018 0000   K 4.2 09/24/2024 0339   CL 100 09/24/2024 0339   CL 106 10/19/2018 0000   CO2 29 09/24/2024 0339   CO2 30 10/19/2018 0000   GLUCOSE 92 09/24/2024 0339   BUN 19 09/24/2024 0339   BUN 13 10/19/2018 0000   CREATININE 0.88 09/24/2024 0339   CREATININE 0.93 03/05/2024 0709   CALCIUM  9.2 09/24/2024 0339   CALCIUM  9.0 10/19/2018 0000   EGFR 59 (L) 02/14/2023 0734   GFRNONAA >60 09/24/2024 0339   GFRNONAA 64 10/19/2018 0000    IMAGING past 24 hours MR THORACIC SPINE WO CONTRAST Result Date: 09/23/2024 CLINICAL DATA:  Thoracic compression fracture EXAM: MRI THORACIC SPINE  WITHOUT CONTRAST TECHNIQUE: Multiplanar, multisequence MR imaging of the thoracic spine was performed. No intravenous contrast was administered. COMPARISON:  CT 09/21/2024 FINDINGS: Alignment: Normal Bone marrow signal: There is a chronic compression fracture of T12 with about 20-30% loss of vertebral height. There is an acute fracture through the superior endplate of T11. This extends through the left pedicle. There is ankylosis of the thoracic spine that extends from T3-L1. The fracture extends through the syndesmophytes anteriorly. Thoracic spinal cord: Normal Facet joints: No significant abnormality Intervertebral discs: There is no disc herniation. Paraspinal tissues: There is dependent atelectasis in both lungs with small pleural effusions. IMPRESSION: 1. Chronic benign compression fracture at T12 2. Thoracic ankylosis 3. Acute fracture extending through the anterior syndesmophytes at T10-11, through the superior endplate of T11 and the left pedicle of T11. Electronically Signed   By: Nancyann Burns M.D.   On: 09/23/2024 16:04    Vitals:   09/23/24 1218 09/23/24 2059 09/24/24 0000 09/24/24 0400  BP: (!) 114/56 139/71 101/60 136/68  Pulse: 70 80 69 74  Resp: 11 20 13 15   Temp: (!) 97.5 F (36.4 C) 98.1 F (36.7 C) 98.1 F (36.7 C) 98.1 F (36.7 C)  TempSrc: Oral Oral Oral Oral  SpO2: 97% 97% 96% 95%  Weight:      Height:  PHYSICAL EXAM General:  Alert, well-nourished, well-developed patient in no acute distress Psych:  Mood and affect appropriate for situation CV: Regular rate and rhythm on monitor Respiratory:  Regular, unlabored respirations on room air   NEURO:  Mental Status: AA&Ox3, patient is able to give clear and coherent history Speech/Language: speech is without dysarthria or aphasia.  Naming, repetition, fluency, and comprehension intact.  Cranial Nerves:  II: PERRL.  Left dense homonymous hemianopsia III, IV, VI: EOMI. Eyelids elevate symmetrically.  V:  Sensation is intact to light touch and symmetrical to face.  VII: Face is symmetrical resting and smiling VIII: hearing intact to voice. IX, X: Phonation is normal.  XII: tongue is midline without fasciculations. Motor: Able to move all 4 extremities with antigravity strength Tone: is normal and bulk is normal Sensation- Intact to light touch bilaterally.  Coordination: FTN intact bilaterally Gait- deferred  Most Recent NIH  1a Level of Conscious.: 0 1b LOC Questions: 0 1c LOC Commands: 0 2 Best Gaze: 0 3 Visual: 2 4 Facial Palsy: 0 5a Motor Arm - left: 0 5b Motor Arm - Right: 0 6a Motor Leg - Left: 0 6b Motor Leg - Right: 0 7 Limb Ataxia: 0 8 Sensory: 0 9 Best Language: 0 10 Dysarthria: 0 11 Extinct. and Inatten.: 0 TOTAL: 2   ASSESSMENT/PLAN  Katherine Obrien is a 84 y.o. female with history of hypertension, hyperlipidemia and seizures admitted after a fall at her facility where she was struck by an automatic door and suffered thoracic spinal fractures.  Brain MRI revealed right PCA territory infarct.  NIH on Admission 3  Acute Ischemic Infarct:  right PCA territory infarct  Etiology: Potentially large vessel multivessel posterior circulation occlusive disease CT head right PCA territory encephalomalacia suggesting late subacute infarct CTA head & neck pending MRI subacute right PCA territory infarct, mild chronic small vessel ischemic disease 2D Echo EF 55 to 60%, mild concentric LVH, grade 1 diastolic dysfunction, mildly dilated left atrium, trivial tricuspid regurgitation, grossly normal atrial septum Will need loop recorder prior to discharge LDL 108 HgbA1c 6.1 VTE prophylaxis -Lovenox  No antithrombotic prior to admission, now on No antithrombotic as she is undergoing kyphoplasty possibly today Therapy recommendations:  Pending Disposition: Pending  Hypertension Home meds: None Stable Blood Pressure Goal: BP less than 220/110   Hyperlipidemia Home  meds: None LDL 108, goal < 70 Add rosuvastatin  20 mg daily Continue statin at discharge  Other Stroke Risk Factors Obesity, Body mass index is 34 kg/m., BMI >/= 30 associated with increased stroke risk, recommend weight loss, diet and exercise as appropriate    Other Active Problems Thoracic spinal fractures-kyphoplasty tentatively planned for today Seizures-continue home [employer]   Hospital day # 2     Patient presented with a fall due to head injury when a door open in front of her possibly due to patient having left hemianopsia from her subacute right PCA stroke and not knowing it.  CT angiogram shows diffuse multi vessel intracranial stenosis.  Will likely need loop recorder at discharge for paroxysmal A-fib.  Start aspirin  and Plavix  after kyphoplasty and maintain aggressive risk factor modification.  Patient will likely need rehab in a skilled nursing setting Long discussion with patient and Dr. Dennise and answered questions.     I personally spent a total of 35 minutes in the care of the patient today including getting/reviewing separately obtained history, performing a medically appropriate exam/evaluation, counseling and educating, placing orders, referring and communicating with other health care  professionals, documenting clinical information in the EHR, independently interpreting results, and coordinating care.        Eather Popp, MD Medical Director [facility] Pager: [phone] 09/24/2024 2:42 PM   To contact Stroke Continuity provider, please refer to [email or web address]. After hours, contact General Neurology

## 2024-09-25 ENCOUNTER — Inpatient Hospital Stay (HOSPITAL_COMMUNITY)

## 2024-09-25 DIAGNOSIS — R4182 Altered mental status, unspecified: Secondary | ICD-10-CM | POA: Diagnosis not present

## 2024-09-25 HISTORY — PX: IR KYPHO THORACIC WITH BONE BIOPSY: IMG5518

## 2024-09-25 MED ORDER — IOHEXOL 300 MG/ML  SOLN
50.0000 mL | Freq: Once | INTRAMUSCULAR | Status: AC | PRN
  Administered 2024-09-25: 20 mL

## 2024-09-25 MED ORDER — DIPHENHYDRAMINE HCL 50 MG/ML IJ SOLN
INTRAMUSCULAR | Status: AC | PRN
  Administered 2024-09-25: 25 mg via INTRAVENOUS

## 2024-09-25 MED ORDER — DIPHENHYDRAMINE HCL 50 MG/ML IJ SOLN
INTRAMUSCULAR | Status: AC
  Filled 2024-09-25: qty 1

## 2024-09-25 MED ORDER — BUPIVACAINE HCL (PF) 0.25 % IJ SOLN
30.0000 mL | Freq: Once | INTRAMUSCULAR | Status: AC
  Administered 2024-09-25: 30 mL

## 2024-09-25 MED ORDER — MIDAZOLAM HCL 2 MG/2ML IJ SOLN
INTRAMUSCULAR | Status: AC
  Filled 2024-09-25: qty 4

## 2024-09-25 MED ORDER — ALUM & MAG HYDROXIDE-SIMETH 200-200-20 MG/5ML PO SUSP: 30.0000 mL | Freq: Four times a day (QID) | ORAL | Status: DC | PRN

## 2024-09-25 MED ORDER — FENTANYL CITRATE (PF) 100 MCG/2ML IJ SOLN
INTRAMUSCULAR | Status: AC
  Filled 2024-09-25: qty 4

## 2024-09-25 MED ORDER — CEFAZOLIN SODIUM-DEXTROSE 2-4 GM/100ML-% IV SOLN
INTRAVENOUS | Status: AC
  Filled 2024-09-25: qty 100

## 2024-09-25 MED ORDER — MIDAZOLAM HCL (PF) 2 MG/2ML IJ SOLN
INTRAMUSCULAR | Status: AC | PRN
  Administered 2024-09-25 (×3): .5 mg via INTRAVENOUS
  Administered 2024-09-25: 1 mg via INTRAVENOUS
  Administered 2024-09-25: .5 mg via INTRAVENOUS
  Administered 2024-09-25: 1 mg via INTRAVENOUS

## 2024-09-25 MED ORDER — LIDOCAINE HCL (PF) 1 % IJ SOLN
INTRAMUSCULAR | Status: AC
  Filled 2024-09-25: qty 30

## 2024-09-25 MED ORDER — BUPIVACAINE HCL (PF) 0.5 % IJ SOLN
INTRAMUSCULAR | Status: AC
  Filled 2024-09-25: qty 30

## 2024-09-25 MED ORDER — PANTOPRAZOLE SODIUM 40 MG PO TBEC
40.0000 mg | DELAYED_RELEASE_TABLET | Freq: Every day | ORAL | Status: DC
  Administered 2024-09-25 – 2024-09-26 (×2): 40 mg via ORAL
  Filled 2024-09-25 (×2): qty 1

## 2024-09-25 MED ORDER — NALOXONE HCL 0.4 MG/ML IJ SOLN
INTRAMUSCULAR | Status: AC
  Filled 2024-09-25: qty 1

## 2024-09-25 MED ORDER — MIDAZOLAM HCL 2 MG/2ML IJ SOLN
INTRAMUSCULAR | Status: AC
  Filled 2024-09-25: qty 2

## 2024-09-25 MED ORDER — FENTANYL CITRATE (PF) 100 MCG/2ML IJ SOLN
INTRAMUSCULAR | Status: AC | PRN
  Administered 2024-09-25: 50 ug via INTRAVENOUS
  Administered 2024-09-25 (×2): 25 ug via INTRAVENOUS
  Administered 2024-09-25: 50 ug via INTRAVENOUS
  Administered 2024-09-25 (×2): 25 ug via INTRAVENOUS

## 2024-09-25 MED ORDER — LIDOCAINE HCL (PF) 1 % IJ SOLN
30.0000 mL | Freq: Once | INTRAMUSCULAR | Status: AC
  Administered 2024-09-25: 20 mL via INTRADERMAL
  Filled 2024-09-25: qty 30

## 2024-09-25 MED ORDER — FENTANYL CITRATE (PF) 100 MCG/2ML IJ SOLN
INTRAMUSCULAR | Status: AC
  Filled 2024-09-25: qty 2

## 2024-09-25 NOTE — [place] FL2 (Signed)
 " [place]  MEDICAID FL2 LEVEL OF CARE FORM     IDENTIFICATION  Patient Name: Katherine Obrien Birthdate: [birth year] Sex: female Admission Date (Current Location): 09/21/2024  [facility] and [place] Number:  Producer, Television/film/video and Address:  [facility], [address], [place], [place] [address]      Provider Number: [id number]  Attending Physician Name and Address:  Dennise Lavada POUR, MD  Relative Name and Phone Number:  Yaritzy Huser, [phone]    Current Level of Care: Hospital Recommended Level of Care: Skilled Nursing Facility Prior Approval Number:    Date Approved/Denied:   PASRR Number: [id number]  Discharge Plan: SNF    Current Diagnoses: Patient Active Problem List   Diagnosis Date Noted   Acute cerebrovascular accident (CVA) due to occlusion of right posterior cerebral artery (HCC) 09/24/2024   Altered mental status 09/23/2024   Generalized weakness 09/19/2024   Acute bronchitis 03/28/2024   Osteoarthritis, multiple sites 02/29/2024   Edema 04/13/2023   Subclinical hypothyroidism 04/13/2023   Nonrheumatic tricuspid valve regurgitation 04/05/2023   Unresponsive episode 03/10/2023   Fatty liver 02/16/2023   Left breast mass 02/02/2023   Abnormal finding on CT scan 01/27/2023   Abnormal TSH 01/27/2023   HTN (hypertension) 01/27/2023   Cognitive impairment 11/17/2022   Prediabetes 09/29/2022   Chronic kidney disease, stage 3a (HCC) 05/12/2022   Advanced nonexudative age-related macular degeneration of left eye with subfoveal involvement 06/30/2021   Advanced nonexudative age-related macular degeneration of right eye without subfoveal involvement 01/13/2020   Serous detachment of retinal pigment epithelium of right eye 01/13/2020   Serous detachment of retinal pigment epithelium of left eye 01/13/2020   Pseudophakia, both eyes 01/13/2020   Loss of consciousness (HCC) 03/26/2019   Essential hypertension 03/26/2019   Actinic  keratoses 12/20/2018   Elevated systolic blood pressure reading without diagnosis of hypertension 11/16/2018   Urinary tract infection 10/08/2018   Adynamic ileus (HCC) 10/08/2018   Diarrhea 10/05/2018   GERD (gastroesophageal reflux disease) 10/05/2018   Urinary hesitancy 10/05/2018   Advanced care planning/counseling discussion 10/05/2018   Hyperlipidemia 09/19/2018   Gait abnormality 09/17/2018   Macular degeneration 09/17/2018   Dry eyes 09/17/2018   History of diverticulitis 09/17/2018   Seizure disorder (HCC) 09/17/2018   Hx of vertebral fracture repair 09/17/2018   Class 1 obesity due to excess calories with body mass index (BMI) of 34.0 to 34.9 in adult 09/17/2018    Orientation RESPIRATION BLADDER Height & Weight     Self, Time, Situation, Place  Normal Incontinent Weight: 198 lb 3.1 oz (89.9 kg) Height:  5' 4.02 (162.6 cm)  BEHAVIORAL SYMPTOMS/MOOD NEUROLOGICAL BOWEL NUTRITION STATUS      Continent Diet (see dc summary)  AMBULATORY STATUS COMMUNICATION OF NEEDS Skin   Limited Assist Verbally Normal                       Personal Care Assistance Level of Assistance  Bathing, Feeding, Dressing Bathing Assistance: Maximum assistance Feeding assistance: Limited assistance Dressing Assistance: Maximum assistance     Functional Limitations Info  Sight, Hearing Sight Info: Impaired (Eyeglasses) Hearing Info: Impaired      SPECIAL CARE FACTORS FREQUENCY  PT (By licensed PT), OT (By licensed OT)     PT Frequency: 5x/week OT Frequency: 5x/week            Contractures Contractures Info: Not present    Additional Factors Info  Code Status, Allergies Code Status Info:  Full Allergies Info: Barbiturates, Cefdinir, Codeine, Phenobarbital , Tegretol  (carbamazepine ), Tramadol           Current Medications (09/25/2024):  This is the current hospital active medication list Current Facility-Administered Medications  Medication Dose Route Frequency Provider  Last Rate Last Admin   acetaminophen  (TYLENOL ) tablet 650 mg  650 mg Oral Q6H PRN Dena Charleston, MD   650 mg at 09/23/24 0510   Or   acetaminophen  (TYLENOL ) suppository 650 mg  650 mg Rectal Q6H PRN Dena Charleston, MD       acetaminophen  (TYLENOL ) tablet 1,000 mg  1,000 mg Oral TID Arlice Reichert, MD   1,000 mg at 09/25/24 1026   heparin  injection 5,000 Units  5,000 Units Subcutaneous Q8H Singh, Prashant K, MD   5,000 Units at 09/24/24 1401   Muscle Rub CREA   Topical BID PRN Opyd, Timothy S, MD       ondansetron  (ZOFRAN ) tablet 4 mg  4 mg Oral Q6H PRN Dena Charleston, MD       Or   ondansetron  (ZOFRAN ) injection 4 mg  4 mg Intravenous Q6H PRN Dorrell, Robert, MD       oxyCODONE  (Oxy IR/ROXICODONE ) immediate release tablet 5 mg  5 mg Oral Q6H PRN Dahal, Reichert, MD   5 mg at 09/24/24 1632   rosuvastatin  (CRESTOR ) tablet 20 mg  20 mg Oral Daily de Clint Kill, Cortney E, NP   20 mg at 09/25/24 1027   zonisamide  (ZONEGRAN ) capsule 100 mg  100 mg Oral Daily Dena Charleston, MD   100 mg at 09/25/24 1027     Discharge Medications: Please see discharge summary for a list of discharge medications.  Relevant Imaging Results:  Relevant Lab Results:   Additional Information [id number]  Inocente GORMAN Kindle, LCSW     "

## 2024-09-25 NOTE — Plan of Care (Signed)

## 2024-09-25 NOTE — Procedures (Signed)
 Vascular and Interventional Radiology Procedure Note  Patient: Katherine Obrien DOB: February 28, 1941 Medical Record Number: [id number] Note Date/Time: 09/25/2024 2:15 PM   Performing Physician: Thom Hall, MD Assistant(s): None  Diagnosis: Symptomatic T11 vertebral body fracture.  Procedure: T11 VERTEBRAL BODY BIOPSY and KYPHOPLASTY  Anesthesia: Conscious Sedation Complications: None Estimated Blood Loss: Minimal Specimens: Sent for Pathology  Findings:  Successful Fluoroscopy-guided T11 vertebral body, bipedicular Kyphoplasty. T11 bone Bx w fragment submitted for analysis A total of 4 mL PMMA was used. Hemostasis of the tract was achieved using Manual Pressure.  Plan: Bed rest for 1 hours.  See detailed procedure note with images in PACS. The patient tolerated the procedure well without incident or complication and was returned to Recovery in stable condition.    Thom Hall, MD Vascular and Interventional Radiology Specialists [facility]   Pager. [phone] Clinic. [phone]

## 2024-09-25 NOTE — TOC Progression Note (Signed)
 Transition of Care [facility]) - Progression Note    Patient Details  Name: Katherine Obrien MRN: [id number] Date of Birth: 1941-06-25  Transition of Care [facility]) CM/SW Contact  Sharyne Drum, Student-Social Work Phone Number: 09/25/2024, 11:24 AM  Clinical Narrative:     11:10 am- MSW Student spoke with pt and dtr at bedside about discharge options. MSW Student asked pt if she were to require SNF for rehab if she would be alright with going back to [facility] who confirmed they would have a short term rehab bed for the pt if needed. Notes say they are contemplating CIR vs SNF but are waiting until procedure is over to see how well pt tolerates or qualifies. Dtr expressed some concern about CIR being too much for pt and thinks she would be better off [place]. CSW and MSW Student will continue to follow when medically ready for d/c.     Barriers to Discharge: Continued Medical Work up               Expected Discharge Plan and Services       Living arrangements for the past 2 months: Independent Living Facility                                       Social Drivers of Health (SDOH) Interventions SDOH Screenings   Food Insecurity: No Food Insecurity (09/22/2024)  Housing: Low Risk (09/22/2024)  Transportation Needs: No Transportation Needs (09/22/2024)  Recent Concern: Transportation Needs - Unmet Transportation Needs (07/19/2024)  Utilities: Not At Risk (09/22/2024)  Depression (PHQ2-9): Low Risk (02/29/2024)  Social Connections: Socially Isolated (09/22/2024)  Tobacco Use: Low Risk (09/23/2024)    Readmission Risk Interventions    03/13/2023   11:23 AM  Readmission Risk Prevention Plan  Post Dischage Appt Complete  Medication Screening Complete  Transportation Screening Complete

## 2024-09-25 NOTE — Care Management Important Message (Signed)
 Important Message  Patient Details  Name: Katherine Obrien MRN: [id number] Date of Birth: 1940/10/30   Important Message Given:  Yes - Medicare IM     Claretta Deed 09/25/2024, 3:00 PM

## 2024-09-25 NOTE — Progress Notes (Addendum)
 "                                                                                                                                                                                                                                                                                PROGRESS NOTE     Patient Demographics:    Katherine Obrien, is a 84 y.o. female, DOB - 02-22-1941, FMW:[id number]  Outpatient Primary MD for the patient is Mast, Man X, NP    LOS - 3  Admit date - 09/21/2024    No chief complaint on file.      Brief Narrative (HPI from H&P)   84 y.o. female with PMH significant for HTN, HLD, hypothyroidism, arthritis, epilepsy. Patient lives at [facility] independent living facility. 1/8, patient had a witnessed fall at the facility.  She was going through an automatic door when it closed at her, knocked her over on her back.  Did not hit her head but he started having back pain since then.  She was seen in the ED.  Underwent Obrien-ray of thoracic and lumbar spine which did not show any acute findings and was hence discharged back to facility.  Patient continued to have pain, also started to have confusion and hence she was brought back to ED on 1/10.  In the ER workup suggestive of acute T10/T11 spine fracture, large subacute right PCA infarct without any correlating focal deficits, UTI along with mild metabolic encephalopathy and she was admitted to the hospital.   Subjective:   Patient in bed, appears comfortable, denies any headache, no fever, no chest pain or pressure, no shortness of breath , no abdominal pain. No focal weakness.  Back pain, appears to be in no distress today.   Assessment  & Plan :   Acute back injury with MRI of the T-spine confirming Acute fracture extending through the anterior syndesmophytes at T10-11, through the superior endplate of T11 and the left pedicle of T11 - Had a fall on her back on 1/8, CT of the spine suggests acute hyperextension of T10-T11 with  fracture, getting supportive care, IR has been consulted for possible kyphoplasty which might happen on 09/25/2024, continue  supportive care for now.  TLSO brace with activity and commence PT OT.  IR to inform us  about aspirin  start date, discussed with them on 09/23/2024 and 09/24/2024.  They will let us  know.   Large subacute right PCA infarct - MRI brain showed a large subacute right PCA infarct.  No new focal deficits except L peripheral vision loss, seen by neurology initially in the ER, full stroke workup, DAPT once cleared by IR, tentative start date for DAPT 09/26/2024 for 3 months then aspirin  alone, low-dose statin has been started, LDL slightly above goal, A1c is acceptable will have PCP monitor, CTA head and echocardiogram noted, echo unremarkable CTA head has diffuse disease, aspirin  as above, seen by neurology discussed with Dr. Rosemarie on 09/23/2024 and 09/24/2024.  IR has been informed to give us  aspirin  and start date whenever they feel comfortable, she will also need 30 day monitor upon DC, ordered via cardiology.  Postdischarge follow-up with Dr. Saidi in 3 to 4 weeks.  UTI Urinalysis showed hazy yellow urine with large leukocytes, pending, 3 doses of Levaquin  oral if patient accepts, has multiple allergies.  Acute metabolic encephalopathy Secondary to UTI, pain, improved continue to monitor.  History of seizure disorder continue Zonegran       Condition - Extremely Guarded  Family Communication  :   Called and left a message at son's cell phone on 09/23/2024 around 8 AM. Daughter [phone] Ms. Katherine Obrien  updated on her cell phone on 09/23/2024, updated again on 04/24/2025  Code Status : Full code  Consults  : Neurology, IR  PUD Prophylaxis :     Procedures  :     TTE -  1. Left ventricular ejection fraction, by estimation, is 55 to 60%. The left ventricle has normal function. The left ventricle has no regional wall motion abnormalities. There is mild concentric left  ventricular hypertrophy. Left ventricular diastolic parameters are consistent with Grade I diastolic dysfunction (impaired relaxation).  2. Right ventricular systolic function is normal. The right ventricular size is normal. There is normal pulmonary artery systolic pressure.  3. Left atrial size was mildly dilated.  4. The mitral valve was not well visualized. No evidence of mitral valve regurgitation. Severe mitral annular calcification.  5. The aortic valve is grossly normal. There is moderate calcification of the aortic valve. There is moderate thickening of the aortic valve. Aortic valve regurgitation is not visualized. Aortic valve sclerosis/calcification is present, without any evidence of aortic stenosis.  6. The inferior vena cava is normal in size with <50% respiratory variability, suggesting right atrial pressure of 8 mmHg.   MRI T-spine. 1. Chronic benign compression fracture at T12 2. Thoracic ankylosis 3. Acute fracture extending through the anterior syndesmophytes at T10-11, through the superior endplate of T11 and the left pedicle of T11.  CTA head and neck.  1. Occluded right P2 PCA. 2. Severe left P2 PCA stenosis. 3. Severe left M2 MCA stenosis. 4. Severe right vertebral artery origin stenosis. 5. Approximately 60% stenosis of the proximal right ICA in the neck. 6. Evolving right PCA territory infarct with suspected petechial hemorrhage. No progressive mass effect.  MRI brain.  1. Large subacute right PCA infarct. 2. Mild chronic small vessel ischemic disease   CT head and neck.  Nonacute.  CT T-spine.   1. Acute hyperextension injury at T10-T11 with fracture and uplifting of the superior T11 endplate and anterior widening of the T10-11 disc space, consistent with a T10-T11 B3 hyperextension distraction injury per AO  Spine classification. 2. Thoracic ankylosis  CT L-spine. 1. No evidence of acute traumatic injury. 2. Moderate spinal canal stenosis at L3-L4, L4-L5, and L5-S1. 3. Moderate  bilateral L5 neural foraminal stenosis.      Disposition Plan  :    Status is: Inpatient  DVT Prophylaxis  :    heparin  injection 5,000 Units Start: 09/23/24 1715 SCDs Start: 09/22/24 0059    Lab Results  Component Value Date   PLT 211 09/22/2024    Diet :  Diet Order             Diet regular Room service appropriate? Yes; Fluid consistency: Thin  Diet effective now                    Inpatient Medications  Scheduled Meds:  acetaminophen   1,000 mg Oral TID   heparin  injection (subcutaneous)  5,000 Units Subcutaneous Q8H   rosuvastatin   20 mg Oral Daily   zonisamide   100 mg Oral Daily   Continuous Infusions:   ceFAZolin  (ANCEF ) IV      PRN Meds:.acetaminophen  **OR** acetaminophen , Muscle Rub, ondansetron  **OR** ondansetron  (ZOFRAN ) IV, oxyCODONE   Antibiotics  :    Anti-infectives (From admission, onward)    Start     Dose/Rate Route Frequency Ordered Stop   09/25/24 1000  ceFAZolin  (ANCEF ) IVPB 2g/100 mL premix        2 g 200 mL/hr over 30 Minutes Intravenous To Radiology 09/24/24 1546 09/26/24 1000   09/23/24 0630  levofloxacin  (LEVAQUIN ) tablet 500 mg        500 mg Oral Every other day 09/23/24 0537 09/25/24 1027   09/22/24 0115  fosfomycin  (MONUROL ) packet 3 g        3 g Oral  Once 09/22/24 0106 09/22/24 0123         Objective:   Vitals:   09/24/24 0400 09/24/24 2028 09/24/24 2336 09/25/24 0812  BP: 136/68 137/62 138/62 131/88  Pulse: 74     Resp: 15     Temp: 98.1 F (36.7 C) (!) 97.5 F (36.4 C) 97.8 F (36.6 C) 98.3 F (36.8 C)  TempSrc: Oral Oral Oral Oral  SpO2: 95%     Weight:      Height:        Wt Readings from Last 3 Encounters:  09/22/24 89.9 kg  09/19/24 90.4 kg  07/18/24 91.4 kg     Intake/Output Summary (Last 24 hours) at 09/25/2024 1047 Last data filed at 09/24/2024 1732 Gross per 24 hour  Intake --  Output 800 ml  Net -800 ml     Physical Exam  Awake Alert, No new F.N deficits, Normal  affect [place].AT,PERRAL Supple Neck, No JVD,   Symmetrical Chest wall movement, Good air movement bilaterally, CTAB RRR,No Gallops,Rubs or new Murmurs,  +ve B.Sounds, Abd Soft, No tenderness,   No Cyanosis, Clubbing or edema       Data Review:    Recent Labs  Lab 09/21/24 2218 09/22/24 0601  WBC 12.3* 11.0*  HGB 13.4 12.4  HCT 41.2 39.6  PLT 215 211  MCV 89.0 90.0  MCH 28.9 28.2  MCHC 32.5 31.3  RDW 14.9 14.7  LYMPHSABS 2.3  --   MONOABS 1.2*  --   EOSABS 0.5  --   BASOSABS 0.1  --     Recent Labs  Lab 09/21/24 2218 09/22/24 0600 09/22/24 0601 09/24/24 0339  NA 139  --  142 137  K 3.7  --  4.3 4.2  CL 104  --  105 100  CO2 26  --  29 29  ANIONGAP 10  --  8 9  GLUCOSE 105*  --  120* 92  BUN 24*  --  22 19  CREATININE 1.00  --  1.17* 0.88  AST 21  --   --   --   ALT 14  --   --   --   ALKPHOS 115  --   --   --   BILITOT 0.4  --   --   --   ALBUMIN 3.6  --   --   --   INR  --   --   --  1.0  HGBA1C  --  6.1*  --   --   MG  --   --   --  2.0  CALCIUM  9.1  --  9.2 9.2      Recent Labs  Lab 09/21/24 2218 09/22/24 0600 09/22/24 0601 09/24/24 0339  INR  --   --   --  1.0  HGBA1C  --  6.1*  --   --   MG  --   --   --  2.0  CALCIUM  9.1  --  9.2 9.2    --------------------------------------------------------------------------------------------------------------- Lab Results  Component Value Date   CHOL 173 09/23/2024   HDL 47 09/23/2024   LDLCALC 108 (H) 09/23/2024   TRIG 88 09/23/2024   CHOLHDL 3.7 09/23/2024    Lab Results  Component Value Date   HGBA1C 6.1 (H) 09/22/2024   No results for input(s): TSH, T4TOTAL, FREET4, T3FREE, THYROIDAB in the last 72 hours. No results for input(s): VITAMINB12, FOLATE, FERRITIN, TIBC, IRON, RETICCTPCT in the last 72 hours. ------------------------------------------------------------------------------------------------------------------ Cardiac Enzymes No results for input(s): CKMB,  TROPONINI, MYOGLOBIN in the last 168 hours.  Invalid input(s): CK  Micro Results Recent Results (from the past 240 hours)  Urine Culture     Status: Abnormal   Collection Time: 09/21/24 11:56 PM   Specimen: Urine, Clean Catch  Result Value Ref Range Status   Specimen Description   Final    URINE, CLEAN CATCH Performed at [facility], [address] [place], [place] [address]    Special Requests   Final    NONE Performed at [facility], [address] [place], [place] [address]    Culture MULTIPLE SPECIES PRESENT, SUGGEST RECOLLECTION (A)  Final   Report Status 09/23/2024 FINAL  Final    Radiology Report MR THORACIC SPINE WO CONTRAST Result Date: 09/23/2024 CLINICAL DATA:  Thoracic compression fracture EXAM: MRI THORACIC SPINE WITHOUT CONTRAST TECHNIQUE: Multiplanar, multisequence MR imaging of the thoracic spine was performed. No intravenous contrast was administered. COMPARISON:  CT 09/21/2024 FINDINGS: Alignment: Normal Bone marrow signal: There is a chronic compression fracture of T12 with about 20-30% loss of vertebral height. There is an acute fracture through the superior endplate of T11. This extends through the left pedicle. There is ankylosis of the thoracic spine that extends from T3-L1. The fracture extends through the syndesmophytes anteriorly. Thoracic spinal cord: Normal Facet joints: No significant abnormality Intervertebral discs: There is no disc herniation. Paraspinal tissues: There is dependent atelectasis in both lungs with small pleural effusions. IMPRESSION: 1. Chronic benign compression fracture at T12 2. Thoracic ankylosis 3. Acute fracture extending through the anterior syndesmophytes at T10-11, through the superior endplate of T11 and the left pedicle of T11. Electronically Signed   By: Nancyann Burns M.D.   On: 09/23/2024 16:04  CT ANGIO HEAD NECK W WO CM Result Date: 09/23/2024 EXAM: CTA Head and Neck with  Intravenous Contrast. CT Head without Contrast. CLINICAL HISTORY: Stroke TECHNIQUE: Axial CTA images of the head and neck performed with intravenous contrast. MIP reconstructed images were created and reviewed. Axial computed tomography images of the head/brain performed without intravenous contrast. Note: Per PQRS, the description of internal carotid artery percent stenosis, including 0 percent or normal exam, is based on North American Symptomatic Carotid Endarterectomy Trial (NASCET) criteria. Dose reduction technique was used including one or more of the following: automated exposure control, adjustment of mA and kV according to patient size, and/or iterative reconstruction. CONTRAST: With; COMPARISON: MRI Yesterday FINDINGS: CT HEAD: BRAIN: Evolving right PCA territory infarct with suspected petechial hemorrhage. No progressive mass effect. No mass lesion. No midline shift or extra-axial collection. VENTRICLES: No hydrocephalus. ORBITS: The orbits are unremarkable. SINUSES AND MASTOIDS: The paranasal sinuses and mastoid air cells are clear. CTA NECK: COMMON CAROTID ARTERIES: No significant stenosis. No dissection or occlusion. INTERNAL CAROTID ARTERIES: Approximately 60% stenosis of the proximal right ICA due to atherosclerosis. No dissection or occlusion. VERTEBRAL ARTERIES: Severe right vertebral artery origin stenosis. Left vertebral artery is patent without greater than 50% stenosis. CTA HEAD: ANTERIOR CEREBRAL ARTERIES: Hypoplastic right A1 ACA. No significant stenosis. No occlusion. No aneurysm. MIDDLE CEREBRAL ARTERIES: Severe left M2 MCA stenosis. No occlusion. No aneurysm. POSTERIOR CEREBRAL ARTERIES: Occluded right P2 PCA. Severe left P2 PCA stenosis. No aneurysm. BASILAR ARTERY: No significant stenosis. No occlusion. No aneurysm. OTHER: SOFT TISSUES: No acute finding. No masses or lymphadenopathy. BONES: No acute osseous abnormality. Other: Findings will be communicated to the clinical team by a  radiology assistant and documented in PACS/Clario. IMPRESSION: 1. Occluded right P2 PCA. 2. Severe left P2 PCA stenosis. 3. Severe left M2 MCA stenosis. 4. Severe right vertebral artery origin stenosis. 5. Approximately 60% stenosis of the proximal right ICA in the neck. 6. Evolving right PCA territory infarct with suspected petechial hemorrhage. No progressive mass effect. Electronically signed by: Gilmore Molt MD MD 09/23/2024 02:46 PM EST RP Workstation: [id number]     Signature  -   Lavada Stank M.D on 09/25/2024 at 10:47 AM   -  To page go to [email or web address]   "

## 2024-09-26 DIAGNOSIS — N3000 Acute cystitis without hematuria: Secondary | ICD-10-CM | POA: Diagnosis not present

## 2024-09-26 LAB — CBC WITH DIFFERENTIAL/PLATELET
Abs Immature Granulocytes: 0.06 K/uL (ref 0.00–0.07)
Basophils Absolute: 0 K/uL (ref 0.0–0.1)
Basophils Relative: 0 %
Eosinophils Absolute: 0.6 K/uL — ABNORMAL HIGH (ref 0.0–0.5)
Eosinophils Relative: 6 %
HCT: 39.5 % (ref 36.0–46.0)
Hemoglobin: 12.7 g/dL (ref 12.0–15.0)
Immature Granulocytes: 1 %
Lymphocytes Relative: 16 %
Lymphs Abs: 1.5 K/uL (ref 0.7–4.0)
MCH: 28.9 pg (ref 26.0–34.0)
MCHC: 32.2 g/dL (ref 30.0–36.0)
MCV: 90 fL (ref 80.0–100.0)
Monocytes Absolute: 0.9 K/uL (ref 0.1–1.0)
Monocytes Relative: 9 %
Neutro Abs: 6.4 K/uL (ref 1.7–7.7)
Neutrophils Relative %: 68 %
Platelets: 218 K/uL (ref 150–400)
RBC: 4.39 MIL/uL (ref 3.87–5.11)
RDW: 14.3 % (ref 11.5–15.5)
WBC: 9.5 K/uL (ref 4.0–10.5)
nRBC: 0 % (ref 0.0–0.2)

## 2024-09-26 LAB — BASIC METABOLIC PANEL WITH GFR
Anion gap: 8 (ref 5–15)
BUN: 24 mg/dL — ABNORMAL HIGH (ref 8–23)
CO2: 28 mmol/L (ref 22–32)
Calcium: 9.2 mg/dL (ref 8.9–10.3)
Chloride: 101 mmol/L (ref 98–111)
Creatinine, Ser: 1.08 mg/dL — ABNORMAL HIGH (ref 0.44–1.00)
GFR, Estimated: 50 mL/min — ABNORMAL LOW
Glucose, Bld: 109 mg/dL — ABNORMAL HIGH (ref 70–99)
Potassium: 4.5 mmol/L (ref 3.5–5.1)
Sodium: 137 mmol/L (ref 135–145)

## 2024-09-26 LAB — MAGNESIUM: Magnesium: 2.1 mg/dL (ref 1.7–2.4)

## 2024-09-26 MED ORDER — ASPIRIN 81 MG PO TBEC
81.0000 mg | DELAYED_RELEASE_TABLET | Freq: Every day | ORAL | Status: DC
  Administered 2024-09-26: 81 mg via ORAL
  Filled 2024-09-26: qty 1

## 2024-09-26 MED ORDER — OXYCODONE HCL 5 MG PO TABS: 5.0000 mg | ORAL_TABLET | Freq: Four times a day (QID) | ORAL | 0 refills | Status: DC | PRN

## 2024-09-26 MED ORDER — ROSUVASTATIN CALCIUM 20 MG PO TABS: 20.0000 mg | ORAL_TABLET | Freq: Every day | ORAL | Status: AC

## 2024-09-26 MED ORDER — PANTOPRAZOLE SODIUM 40 MG PO TBEC: 40.0000 mg | DELAYED_RELEASE_TABLET | Freq: Every day | ORAL | Status: AC

## 2024-09-26 MED ORDER — CLOPIDOGREL BISULFATE 75 MG PO TABS: 75.0000 mg | ORAL_TABLET | Freq: Every day | ORAL | Status: AC

## 2024-09-26 MED ORDER — ASPIRIN 81 MG PO TBEC: 81.0000 mg | DELAYED_RELEASE_TABLET | Freq: Every day | ORAL | Status: AC

## 2024-09-26 MED ORDER — ACETAMINOPHEN 500 MG PO TABS: 1000.0000 mg | ORAL_TABLET | Freq: Three times a day (TID) | ORAL | Status: AC | PRN

## 2024-09-26 MED ORDER — CLOPIDOGREL BISULFATE 75 MG PO TABS: 75.0000 mg | ORAL_TABLET | Freq: Every day | ORAL | Status: DC

## 2024-09-26 NOTE — TOC Progression Note (Signed)
 Transition of Care [facility]) - Progression Note    Patient Details  Name: [name] MRN: [id number] Date of Birth: 1941-07-10  [facility] CM/SW Contact  Inocente GORMAN Kindle, LCSW Phone Number: 09/26/2024, 8:26 AM  Clinical Narrative:    8:26 AM-Awaiting response from [facility] on if they have a bed at SNF today.     Barriers to Discharge: Continued Medical Work up               Expected Discharge Plan and Services       Living arrangements for the past 2 months: Independent Living Facility                                       Social Drivers of Health (SDOH) Interventions SDOH Screenings   Food Insecurity: No Food Insecurity (09/22/2024)  Housing: Low Risk (09/22/2024)  Transportation Needs: No Transportation Needs (09/22/2024)  Recent Concern: Transportation Needs - Unmet Transportation Needs (07/19/2024)  Utilities: Not At Risk (09/22/2024)  Depression (PHQ2-9): Low Risk (02/29/2024)  Social Connections: Socially Isolated (09/22/2024)  Tobacco Use: Low Risk (09/23/2024)    Readmission Risk Interventions    03/13/2023   11:23 AM  Readmission Risk Prevention Plan  Post Dischage Appt Complete  Medication Screening Complete  Transportation Screening Complete

## 2024-09-26 NOTE — Progress Notes (Signed)
 Occupational Therapy Treatment Patient Details Name: Katherine Obrien MRN: [id number] DOB: 1941/02/26 Today's Date: 09/26/2024   History of present illness 84 y.o. female recently in ED 09/19/24 with back pain after fall, now admitted from [facility] on 09/21/24 due to inability to care for self at home. Workup revealed UTI, acute hyperextension T10-T11 with fx. MRI 1/11 showed large subacute R PCA infarct. Kyphoplasty 1/14. PMH includes seizure disorder, HTN, CKDIII.   OT comments  Pt seen for first session since kyphoplasty though minimal progress made towards goals primarily due to pt behaviors vs cognition(?). Initially assisted pt with bed mobility with Min A x 2 though pt adamant on no further assist to be provided. Accommodated pt's requests to facilitate partially sitting EOB w/ pt sitting approx 15 min but due to behaviors, unable to progress further today and assisted back to bed with Min A x 2. Patient will benefit from continued inpatient follow up therapy, <3 hours/day at [facility].      If plan is discharge home, recommend the following:  Two people to help with walking and/or transfers;A lot of help with bathing/dressing/bathroom;Assistance with cooking/housework;Assist for transportation;Help with stairs or ramp for entrance   Equipment Recommendations  Other (comment) (TBD)    Recommendations for Other Services      Precautions / Restrictions Precautions Precautions: Fall;Back Recall of Precautions/Restrictions: Impaired Required Braces or Orthoses: Spinal Brace Spinal Brace: Thoracolumbosacral orthotic Restrictions Weight Bearing Restrictions Per Provider Order: No       Mobility Bed Mobility Overal bed mobility: Needs Assistance Bed Mobility: Supine to Sit, Sit to Supine     Supine to sit: Min assist, +2 for safety/equipment Sit to supine: Min assist, +2 for safety/equipment   General bed mobility comments: Initial assist for LE towards EOB though some  resistance noted and pt adamant on staff not assisting. Pt able to bring LE off of bed though still not touching floor. Able to reposition trunk towards EOB as well. Pt requesting HOB elevation, something solid to hold on to (bedrail, unable to reach footboard despite education on why, used RW as bedrail as well). Pt sat EOB partially 15 min, requesting increased time but ultimately educated on need for fully EOB or LE back to bed. OT assisted BLE back to bed    Transfers                   General transfer comment: unable to progress today     Balance Overall balance assessment: Needs assistance Sitting-balance support: Bilateral upper extremity supported, Single extremity supported, Feet supported Sitting balance-Leahy Scale: Poor                                     ADL either performed or assessed with clinical judgement   ADL Overall ADL's : Needs assistance/impaired                                       General ADL Comments: Emphasis on further EOB/OOB attempts as pt now s/p kyphoplasty. Very limited d/t pt behaviors today    Extremity/Trunk Assessment Upper Extremity Assessment Upper Extremity Assessment: Generalized weakness;RUE deficits/detail;LUE deficits/detail RUE Deficits / Details: Decreased shoulder ROM, Pt endorses is baseline and functional for her everyday tasks LUE Deficits / Details: Decreased shoulder ROM, Pt endorses is baseline and functional  for her everyday tasks   Lower Extremity Assessment Lower Extremity Assessment: Defer to PT evaluation        Vision   Vision Assessment?: Vision impaired- to be further tested in functional context Additional Comments: L hemianopsia per neuro note. unable to fully assess today due to pt behaviors   Perception     Praxis     Communication Communication Communication: Impaired Factors Affecting Communication: Hearing impaired   Cognition Arousal: Alert Behavior During  Therapy: Flat affect, Agitated Cognition: No family/caregiver present to determine baseline     Awareness: Online awareness impaired Memory impairment (select all impairments): Working memory Attention impairment (select first level of impairment): Sustained attention Executive functioning impairment (select all impairments): Organization, Sequencing, Reasoning, Problem solving OT - Cognition Comments: very particular about flow of session, resistant to staff assist despite assist being needed. very easily agitated, made indirect threat to staff member                 Following commands: Impaired Following commands impaired: Follows one step commands inconsistently, Follows one step commands with increased time      Cueing   Cueing Techniques: Verbal cues, Visual cues, Gestural cues  Exercises      Shoulder Instructions       General Comments      Pertinent Vitals/ Pain       Pain Assessment Pain Assessment: 0-10 Pain Score: 5  Pain Location: low back Pain Descriptors / Indicators: Grimacing, Guarding Pain Intervention(s): Monitored during session, Other (comment) (RN brought pain meds during session)  Home Living                                          Prior Functioning/Environment              Frequency  Min 1X/week        Progress Toward Goals  OT Goals(current goals can now be found in the care plan section)  Progress towards OT goals: Not progressing toward goals - comment  Acute Rehab OT Goals Patient Stated Goal: do things one notch at a time OT Goal Formulation: With patient Time For Goal Achievement: 10/08/24 Potential to Achieve Goals: Good ADL Goals Pt Will Perform Upper Body Dressing: with supervision;sitting Pt Will Perform Lower Body Dressing: with mod assist;with adaptive equipment;sitting/lateral leans Pt Will Transfer to Toilet: with min assist;stand pivot transfer;bedside commode Additional ADL Goal #1: Pt will  verbalize 3/3 back precautions without assistance.  Plan      Co-evaluation    PT/OT/SLP Co-Evaluation/Treatment: Yes Reason for Co-Treatment: Complexity of the patient's impairments (multi-system involvement);To address functional/ADL transfers PT goals addressed during session: Mobility/safety with mobility;Balance;Proper use of DME OT goals addressed during session: ADL's and self-care;Proper use of Adaptive equipment and DME      AM-PAC OT 6 Clicks Daily Activity     Outcome Measure   Help from another person eating meals?: A Little Help from another person taking care of personal grooming?: A Little Help from another person toileting, which includes using toliet, bedpan, or urinal?: Total Help from another person bathing (including washing, rinsing, drying)?: A Lot Help from another person to put on and taking off regular upper body clothing?: A Lot Help from another person to put on and taking off regular lower body clothing?: Total 6 Click Score: 12    End of Session    OT  Visit Diagnosis: Unsteadiness on feet (R26.81);Muscle weakness (generalized) (M62.81);History of falling (Z91.81);Pain   Activity Tolerance Treatment limited secondary to agitation   Patient Left in bed;with call bell/phone within reach;with bed alarm set;with nursing/sitter in room   Nurse Communication Mobility status        Time: 9080-9051 OT Time Calculation (min): 29 min  Charges: OT General Charges $OT Visit: 1 Visit OT Treatments $Therapeutic Activity: 8-22 mins  Mliss NOVAK, OTR/L Acute Rehab Services Office: [phone]   Mliss Fish 09/26/2024, 10:52 AM

## 2024-09-26 NOTE — Discharge Summary (Signed)
 "                                                                                                                                                                               Discharge summary note.  Katherine Obrien FMW:[id number] DOB: [birth year] DOA: 09/21/2024  PCP: Mast, Man X, NP  Admit date: 09/21/2024  Discharge date: 09/26/2024  Admitted From: ALF   Disposition:  SNF   Recommendations for Outpatient Follow-up:   Follow up with PCP in 1-2 weeks  PCP Please obtain BMP/CBC, 2 view CXR in 1week,  (see Discharge instructions)   PCP Please follow up on the following pending results:    Home Health: None   Equipment/Devices: None  Consultations: Neuro, IR Discharge Condition: Stable    CODE STATUS: Full    Diet Recommendation: Heart Healthy     No chief complaint on file.    Brief history of present illness from the day of admission and additional interim summary     84 y.o. female with PMH significant for HTN, HLD, hypothyroidism, arthritis, epilepsy. Patient lives at [facility] independent living facility. 1/8, patient had a witnessed fall at the facility.  She was going through an automatic door when it closed at her, knocked her over on her back.  Did not hit her head but he started having back pain since then.  She was seen in the ED.  Underwent x-ray of thoracic and lumbar spine which did not show any acute findings and was hence discharged back to facility.  Patient continued to have pain, also started to have confusion and hence she was brought back to ED on 1/10.   In the ER workup suggestive of acute T10/T11 spine fracture, large subacute right PCA infarct without any correlating focal deficits, UTI along with mild metabolic encephalopathy and she was admitted to the hospital.                                                                 Hospital Course   Acute back injury with MRI of the T-spine confirming Acute fracture extending through the anterior  syndesmophytes at T10-11, through the superior endplate of T11 and the left pedicle of T11 - Had a fall on her back on 1/8, CT of the spine suggests acute hyperextension of T10-T11 with fracture, getting supportive care, IR was consulted and underwent kyphoplasty on 09/25/2024, pain improved, discussed with IR team again on 09/26/2024 stable for discharge from  their standpoint, will also provide her with TLSO brace when out of the bed, will be discharged to SNF with outpatient follow-up with IR and PCP.  Currently feeling much better.   Large subacute right PCA infarct with left-sided visual field deficits- MRI brain showed a large subacute right PCA infarct.  No new focal deficits except L peripheral vision loss, seen by neurology initially in the ER, full stroke workup was done, started on DAPT 09/26/2024 for 3 months, after 3 months aspirin  alone, low-dose statin has been started continue indefinitely, LDL slightly above goal, A1c is acceptable will have PCP monitor.  CTA head and echocardiogram noted, echo unremarkable CTA head has diffuse disease, aspirin  as above, seen by neurology discussed with Dr. Rosemarie on 09/23/2024 and 09/24/2024.  IR has been informed to give us  aspirin  and start date whenever they feel comfortable, she will also need 30 day monitor upon DC, ordered via cardiology.  Postdischarge follow-up with Dr. Rosemarie in 3 to 4 weeks.   UTI mild dysuria, treated empirically with 3 doses of antibiotics, resolved..  Acute metabolic encephalopathy Secondary to UTI and pain.  Completely resolved.   History of seizure disorder continue Zonegran    Discharge diagnosis     Principal Problem:   Urinary tract infection Active Problems:   Altered mental status   Acute cerebrovascular accident (CVA) due to occlusion of right posterior cerebral artery [facility])    Discharge instructions    Discharge Instructions     Discharge instructions   Complete by: As directed    Follow with Primary MD  Mast, Man X, NP in 7 days   Get CBC, CMP, Magnesium, 2 view Chest X ray -  checked next visit with your primary MD or SNF MD   Activity: As tolerated with Full fall precautions use walker/cane & assistance as needed, wear TLSO brace when out of the bed for the next 2 weeks.  Disposition SNF  Diet: Heart Healthy   Special Instructions: If you have smoked or chewed Tobacco  in the last 2 yrs please stop smoking, stop any regular Alcohol   and or any Recreational drug use.  On your next visit with your primary care physician please Get Medicines reviewed and adjusted.  Please request your Prim.MD to go over all Hospital Tests and Procedure/Radiological results at the follow up, please get all Hospital records sent to your Prim MD by signing hospital release before you go home.  If you experience worsening of your admission symptoms, develop shortness of breath, life threatening emergency, suicidal or homicidal thoughts you must seek medical attention immediately by calling 911 or calling your MD immediately  if symptoms less severe.  You Must read complete instructions/literature along with all the possible adverse reactions/side effects for all the Medicines you take and that have been prescribed to you. Take any new Medicines after you have completely understood and accpet all the possible adverse reactions/side effects.   Do not drive when taking Pain medications.  Do not take more than prescribed Pain, Sleep and Anxiety Medications  Wear Seat belts while driving.   Increase activity slowly   Complete by: As directed        Discharge Medications   Allergies as of 09/26/2024       Reactions   Barbiturates Other (See Comments)   A small amount overdosed the patient- affected my stability and balance (might have been given at the same as another med?)   Cefdinir Other (See Comments)   A small  amount overdosed the patient- affected my stability and balance (might have been  given at the same as another med?)   Codeine Other (See Comments)   Moderate, per pharmacy   Phenobarbital  Other (See Comments)   A small amount overdosed the patient- affected my stability and balance (might have been given at the same as another med?)   Tegretol  [carbamazepine ] Other (See Comments)   A small amount overdosed the patient- affected my stability and balance (might have been given at the same as another med?)   Tramadol Other (See Comments)   Moderate, per pharmacy        Medication List     STOP taking these medications    nitrofurantoin  (macrocrystal-monohydrate) 100 MG capsule Commonly known as: Macrobid        TAKE these medications    acetaminophen  500 MG tablet Commonly known as: TYLENOL  Take 2 tablets (1,000 mg total) by mouth every 8 (eight) hours as needed.   aspirin  EC 81 MG tablet Take 1 tablet (81 mg total) by mouth daily. Swallow whole.   Centrum Silver 50+Women Tabs Take 1 tablet by mouth daily with breakfast.   PreserVision AREDS 2 Caps Take 1 capsule by mouth 2 (two) times daily.   clopidogrel  75 MG tablet Commonly known as: PLAVIX  Take 1 tablet (75 mg total) by mouth daily.   OVER THE COUNTER MEDICATION Place 1 drop into both eyes See admin instructions. [employer] Advanced Eye Relief Dry Eye Rejuvenation Lubricant Eye Drops - Instill 1 drop into both eyes three times a day as needed for dryness   oxyCODONE  5 MG immediate release tablet Commonly known as: Oxy IR/ROXICODONE  Take 1 tablet (5 mg total) by mouth every 6 (six) hours as needed for severe pain (pain score 7-10) or breakthrough pain.   pantoprazole  40 MG tablet Commonly known as: PROTONIX  Take 1 tablet (40 mg total) by mouth daily.   rosuvastatin  20 MG tablet Commonly known as: CRESTOR  Take 1 tablet (20 mg total) by mouth daily.   Senna-S 8.6-50 MG tablet Generic drug: senna-docusate Take 1 tablet by mouth at bedtime as needed for mild constipation.    Vitamin D -3 25 MCG (1000 UT) Caps TAKE 1 CAPSULE DAILY WITH BREAKFAST. What changed: See the new instructions.   zonisamide  100 MG capsule Commonly known as: ZONEGRAN  Take 1 capsule (100 mg total) by mouth at bedtime.         Follow-up Information     Mast, Man X, NP. Schedule an appointment as soon as possible for a visit in 1 week(s).   Specialty: Internal Medicine Contact information: [address] [place] [address] [phone]         Hughes Simmonds, MD. Schedule an appointment as soon as possible for a visit in 1 week(s).   Specialties: Interventional Radiology, Diagnostic Radiology, Radiology Contact information: [address] [place] [address] [phone]         [facility]. Schedule an appointment as soon as possible for a visit in 1 month(s).   Contact information: [address]     [address] [place]  [address] [phone]                Major procedures and Radiology Reports - PLEASE review detailed and final reports thoroughly  -       MR THORACIC SPINE WO CONTRAST Result Date: 09/23/2024 CLINICAL DATA:  Thoracic compression fracture EXAM: MRI THORACIC SPINE WITHOUT CONTRAST TECHNIQUE: Multiplanar, multisequence  MR imaging of the thoracic spine was performed. No intravenous contrast was administered. COMPARISON:  CT 09/21/2024 FINDINGS: Alignment: Normal Bone marrow signal: There is a chronic compression fracture of T12 with about 20-30% loss of vertebral height. There is an acute fracture through the superior endplate of T11. This extends through the left pedicle. There is ankylosis of the thoracic spine that extends from T3-L1. The fracture extends through the syndesmophytes anteriorly. Thoracic spinal cord: Normal Facet joints: No significant abnormality Intervertebral discs: There is no disc herniation. Paraspinal tissues: There is dependent atelectasis in both lungs with small pleural  effusions. IMPRESSION: 1. Chronic benign compression fracture at T12 2. Thoracic ankylosis 3. Acute fracture extending through the anterior syndesmophytes at T10-11, through the superior endplate of T11 and the left pedicle of T11. Electronically Signed   By: Nancyann Burns M.D.   On: 09/23/2024 16:04   CT ANGIO HEAD NECK W WO CM Result Date: 09/23/2024 EXAM: CTA Head and Neck with Intravenous Contrast. CT Head without Contrast. CLINICAL HISTORY: Stroke TECHNIQUE: Axial CTA images of the head and neck performed with intravenous contrast. MIP reconstructed images were created and reviewed. Axial computed tomography images of the head/brain performed without intravenous contrast. Note: Per PQRS, the description of internal carotid artery percent stenosis, including 0 percent or normal exam, is based on North American Symptomatic Carotid Endarterectomy Trial (NASCET) criteria. Dose reduction technique was used including one or more of the following: automated exposure control, adjustment of mA and kV according to patient size, and/or iterative reconstruction. CONTRAST: With; COMPARISON: MRI Yesterday FINDINGS: CT HEAD: BRAIN: Evolving right PCA territory infarct with suspected petechial hemorrhage. No progressive mass effect. No mass lesion. No midline shift or extra-axial collection. VENTRICLES: No hydrocephalus. ORBITS: The orbits are unremarkable. SINUSES AND MASTOIDS: The paranasal sinuses and mastoid air cells are clear. CTA NECK: COMMON CAROTID ARTERIES: No significant stenosis. No dissection or occlusion. INTERNAL CAROTID ARTERIES: Approximately 60% stenosis of the proximal right ICA due to atherosclerosis. No dissection or occlusion. VERTEBRAL ARTERIES: Severe right vertebral artery origin stenosis. Left vertebral artery is patent without greater than 50% stenosis. CTA HEAD: ANTERIOR CEREBRAL ARTERIES: Hypoplastic right A1 ACA. No significant stenosis. No occlusion. No aneurysm. MIDDLE CEREBRAL ARTERIES:  Severe left M2 MCA stenosis. No occlusion. No aneurysm. POSTERIOR CEREBRAL ARTERIES: Occluded right P2 PCA. Severe left P2 PCA stenosis. No aneurysm. BASILAR ARTERY: No significant stenosis. No occlusion. No aneurysm. OTHER: SOFT TISSUES: No acute finding. No masses or lymphadenopathy. BONES: No acute osseous abnormality. Other: Findings will be communicated to the clinical team by a radiology assistant and documented in PACS/Clario. IMPRESSION: 1. Occluded right P2 PCA. 2. Severe left P2 PCA stenosis. 3. Severe left M2 MCA stenosis. 4. Severe right vertebral artery origin stenosis. 5. Approximately 60% stenosis of the proximal right ICA in the neck. 6. Evolving right PCA territory infarct with suspected petechial hemorrhage. No progressive mass effect. Electronically signed by: Gilmore Molt MD MD 09/23/2024 02:46 PM EST RP Workstation: [id number]   ECHOCARDIOGRAM COMPLETE Result Date: 09/23/2024    ECHOCARDIOGRAM REPORT   Patient Name:   Katherine Obrien Date of Exam: 09/23/2024 Medical Rec #:  [id number]          Height:       64.0 in Accession #:    [id number]         Weight:       198.2 lb Date of Birth:  1941-04-17         BSA:  1.949 m Patient Age:    83 years           BP:           150/74 mmHg Patient Gender: F                  HR:           79 bpm. Exam Location:  Inpatient Procedure: 2D Echo, Cardiac Doppler, Color Doppler and Intracardiac            Opacification Agent (Both Spectral and Color Flow Doppler were            utilized during procedure). Indications:    Stroke  History:        Patient has prior history of Echocardiogram examinations, most                 recent 01/31/2023. Risk Factors:Hypertension and Dyslipidemia.  Sonographer:    Sherlean Dubin Referring Phys: BINAYA DAHAL IMPRESSIONS  1. Left ventricular ejection fraction, by estimation, is 55 to 60%. The left ventricle has normal function. The left ventricle has no regional wall motion abnormalities. There is mild  concentric left ventricular hypertrophy. Left ventricular diastolic parameters are consistent with Grade I diastolic dysfunction (impaired relaxation).  2. Right ventricular systolic function is normal. The right ventricular size is normal. There is normal pulmonary artery systolic pressure.  3. Left atrial size was mildly dilated.  4. The mitral valve was not well visualized. No evidence of mitral valve regurgitation. Severe mitral annular calcification.  5. The aortic valve is grossly normal. There is moderate calcification of the aortic valve. There is moderate thickening of the aortic valve. Aortic valve regurgitation is not visualized. Aortic valve sclerosis/calcification is present, without any evidence of aortic stenosis.  6. The inferior vena cava is normal in size with <50% respiratory variability, suggesting right atrial pressure of 8 mmHg. Comparison(s): No significant change from prior study. Conclusion(s)/Recommendation(s): Technically challenging study, valves not well visualized. If there is clinical concern for embolic etiology of stroke, would consider TEE for better evaluation. FINDINGS  Left Ventricle: Left ventricular ejection fraction, by estimation, is 55 to 60%. The left ventricle has normal function. The left ventricle has no regional wall motion abnormalities. Definity  contrast agent was given IV to delineate the left ventricular  endocardial borders. The left ventricular internal cavity size was normal in size. There is mild concentric left ventricular hypertrophy. Left ventricular diastolic parameters are consistent with Grade I diastolic dysfunction (impaired relaxation). Right Ventricle: The right ventricular size is normal. Right vetricular wall thickness was not well visualized. Right ventricular systolic function is normal. There is normal pulmonary artery systolic pressure. The tricuspid regurgitant velocity is 1.30 m/s, and with an assumed right atrial pressure of 8 mmHg, the  estimated right ventricular systolic pressure is 14.8 mmHg. Left Atrium: Left atrial size was mildly dilated. Right Atrium: Right atrial size was normal in size. Pericardium: There is no evidence of pericardial effusion. Mitral Valve: The mitral valve was not well visualized. Severe mitral annular calcification. No evidence of mitral valve regurgitation. MV peak gradient, 6.6 mmHg. The mean mitral valve gradient is 3.0 mmHg. Tricuspid Valve: The tricuspid valve is not well visualized. Tricuspid valve regurgitation is trivial. No evidence of tricuspid stenosis. Aortic Valve: The aortic valve is grossly normal. There is moderate calcification of the aortic valve. There is moderate thickening of the aortic valve. Aortic valve regurgitation is not visualized. Aortic valve sclerosis/calcification is present, without any evidence  of aortic stenosis. Aortic valve mean gradient measures 3.0 mmHg. Aortic valve peak gradient measures 6.1 mmHg. Aortic valve area, by VTI measures 2.50 cm. Pulmonic Valve: The pulmonic valve was not well visualized. Pulmonic valve regurgitation is not visualized. No evidence of pulmonic stenosis. Aorta: The aortic root is normal in size and structure, the aortic arch was not well visualized and the ascending aorta was not well visualized. Venous: The inferior vena cava is normal in size with less than 50% respiratory variability, suggesting right atrial pressure of 8 mmHg. IAS/Shunts: The atrial septum is grossly normal.  LEFT VENTRICLE PLAX 2D LVIDd:         3.66 cm   Diastology LVIDs:         2.60 cm   LV e' medial:    4.35 cm/s LV PW:         1.15 cm   LV E/e' medial:  17.8 LV IVS:        1.20 cm   LV e' lateral:   4.46 cm/s LVOT diam:     2.00 cm   LV E/e' lateral: 17.4 LV SV:         62 LV SV Index:   32 LVOT Area:     3.14 cm  RIGHT VENTRICLE             IVC RV S prime:     15.40 cm/s  IVC diam: 1.80 cm TAPSE (M-mode): 1.8 cm LEFT ATRIUM             Index        RIGHT ATRIUM            Index LA diam:        2.70 cm 1.39 cm/m   RA Area:     17.00 cm LA Vol (A2C):   69.8 ml 35.82 ml/m  RA Volume:   48.10 ml  24.68 ml/m LA Vol (A4C):   51.1 ml 26.22 ml/m LA Biplane Vol: 58.9 ml 30.22 ml/m  AORTIC VALVE AV Area (Vmax):    2.36 cm AV Area (Vmean):   2.51 cm AV Area (VTI):     2.50 cm AV Vmax:           123.00 cm/s AV Vmean:          80.650 cm/s AV VTI:            0.248 m AV Peak Grad:      6.1 mmHg AV Mean Grad:      3.0 mmHg LVOT Vmax:         92.50 cm/s LVOT Vmean:        64.450 cm/s LVOT VTI:          0.198 m LVOT/AV VTI ratio: 0.80  AORTA Ao Root diam: 2.80 cm MITRAL VALVE                TRICUSPID VALVE MV Area (PHT): 3.85 cm     TR Peak grad:   6.8 mmHg MV Area VTI:   2.92 cm     TR Vmax:        130.00 cm/s MV Peak grad:  6.6 mmHg MV Mean grad:  3.0 mmHg     SHUNTS MV Vmax:       1.28 m/s     Systemic VTI:  0.20 m MV Vmean:      78.2 cm/s    Systemic Diam: 2.00 cm MV Decel Time: 197 msec MV E velocity: 77.60 cm/s  MV A velocity: 128.00 cm/s MV E/A ratio:  0.61 Shelda Bruckner MD Electronically signed by Shelda Bruckner MD Signature Date/Time: 09/23/2024/11:36:32 AM    Final    MR BRAIN WO CONTRAST Result Date: 09/22/2024 EXAM: MRI BRAIN WITHOUT CONTRAST 09/22/2024 11:18:41 AM TECHNIQUE: Multiplanar multisequence MRI of the head/brain was performed without the administration of intravenous contrast. COMPARISON: Head CT 09/21/2024. CLINICAL HISTORY: Stroke. FINDINGS: BRAIN AND VENTRICLES: A large subacute right PCA infarct is again seen with associated restricted diffusion, cytotoxic edema, cortical laminar necrosis, and likely minimal petechial blood products. No mass, midline shift, extra-axial fluid collection, or hydrocephalus is evident. T2 hyperintensities in the cerebral white matter bilaterally are nonspecific but compatible with mild chronic small vessel ischemic disease. There is mild generalized cerebral atrophy. Major intracranial vascular flow voids are  preserved. ORBITS: Bilateral cataract extraction. SINUSES AND MASTOIDS: Minimal mucosal thickening in the paranasal sinuses. Clear mastoid air cells. BONES AND SOFT TISSUES: Normal marrow signal. No significant soft tissue abnormality. IMPRESSION: 1. Large subacute right PCA infarct. 2. Mild chronic small vessel ischemic disease. Electronically signed by: Dasie Hamburg MD MD 09/22/2024 11:49 AM EST RP Workstation: [id number]   CT CERVICAL SPINE WO CONTRAST Result Date: 09/21/2024 EXAM: CT CERVICAL SPINE WITHOUT CONTRAST 09/21/2024 11:27:03 PM TECHNIQUE: CT of the cervical spine was performed without the administration of intravenous contrast. Multiplanar reformatted images are provided for review. Automated exposure control, iterative reconstruction, and/or weight based adjustment of the mA/kV was utilized to reduce the radiation dose to as low as reasonably achievable. COMPARISON: None available. CLINICAL HISTORY: fall FINDINGS: BONES AND ALIGNMENT: No acute fracture or traumatic malalignment. DEGENERATIVE CHANGES: Degenerative disc disease is greatest at C5-C6, where there is endplate sclerosis and uncovertebral spurring. Lytic atherosclerosis of the facet. No high-grade cervical spinal canal stenosis. SOFT TISSUES: No prevertebral soft tissue swelling. IMPRESSION: 1. Degenerative disc disease greatest at C5-C6 with endplate sclerosis and uncovertebral spurring. 2. No high-grade cervical spinal canal stenosis. Electronically signed by: Franky Stanford MD MD 09/21/2024 11:45 PM EST RP Workstation: [id number]   CT Thoracic Spine Wo Contrast Result Date: 09/21/2024 EXAM: CT THORACIC SPINE WITHOUT CONTRAST 09/21/2024 11:27:03 PM TECHNIQUE: CT of the thoracic spine was performed without the administration of intravenous contrast. Multiplanar reformatted images are provided for review. Automated exposure control, iterative reconstruction, and/or weight based adjustment of the mA/kV was utilized to reduce the radiation  dose to as low as reasonably achievable. COMPARISON: None available. CLINICAL HISTORY: Mid-back pain. FINDINGS: BONES AND ALIGNMENT: Vertebral body heights are maintained except for the superior T11 endplate, which is fractured and uplifted. At T10-T11, there is evidence of acute hyperextension injury with anterior widening of the T10-T11 disc space. There is no visible osseous extension of the fracture into the posterior elements. According to the AO Spine classification of thoracolumbar injuries, the finding is consistent with a T10-T11: B3 hyperextension distraction injury (with associated anterior endplate fracture at T11) and the recommendation is not specified by this descriptive classification alone. No suspicious bone lesion. DEGENERATIVE CHANGES: There are flowing anterior syndesmophytes along the length of the thoracic spine. SOFT TISSUES: No acute abnormality. IMPRESSION: 1. Acute hyperextension injury at T10-T11 with fracture and uplifting of the superior T11 endplate and anterior widening of the T10-11 disc space, consistent with a T10-T11 B3 hyperextension distraction injury per AO Spine classification. 2. Thoracic ankylosis Electronically signed by: Franky Stanford MD MD 09/21/2024 11:40 PM EST RP Workstation: [id number]   CT Lumbar Spine Wo Contrast Result Date: 09/21/2024 EXAM: CT OF THE LUMBAR  SPINE WITHOUT CONTRAST 09/21/2024 11:27:03 PM TECHNIQUE: CT of the lumbar spine was performed without the administration of intravenous contrast. Multiplanar reformatted images are provided for review. Automated exposure control, iterative reconstruction, and/or weight based adjustment of the mA/kV was utilized to reduce the radiation dose to as low as reasonably achievable. COMPARISON: None available. CLINICAL HISTORY: Low back pain, trauma. FINDINGS: BONES AND ALIGNMENT: Normal vertebral body heights. No acute fracture or suspicious bone lesion. Grade 1 retrolisthesis at L2-L3 and L3-L4. Lumbar  levoscoliosis with apex at L4. DEGENERATIVE CHANGES: Multilevel degenerative disc disease with disc space narrowing and mild endplate remodeling. Multilevel severe lower lumbar facet arthrosis. Moderate spinal canal stenosis at L3-L4, L4-L5, and L5-S1. Moderate bilateral L5 neural foraminal stenosis. SOFT TISSUES: Calcific aortic sclerosis. Cholelithiasis. No acute abnormality. IMPRESSION: 1. No evidence of acute traumatic injury. 2. Moderate spinal canal stenosis at L3-L4, L4-L5, and L5-S1. 3. Moderate bilateral L5 neural foraminal stenosis. Electronically signed by: Franky Stanford MD MD 09/21/2024 11:35 PM EST RP Workstation: [id number]   CT HEAD WO CONTRAST ( ) Result Date: 09/21/2024 EXAM: CT HEAD WITHOUT CONTRAST 09/21/2024 11:27:03 PM TECHNIQUE: CT of the head was performed without the administration of intravenous contrast. Automated exposure control, iterative reconstruction, and/or weight based adjustment of the mA/kV was utilized to reduce the radiation dose to as low as reasonably achievable. COMPARISON: 07/18/2024 CLINICAL HISTORY: Delirium FINDINGS: BRAIN AND VENTRICLES: No acute hemorrhage. No evidence of acute infarct. Since the prior study, encephalomalacia has developed in the right PCA territory. This appears late subacute. MRI would be helpful for better temporal characterization. No hydrocephalus. No extra-axial collection. No mass effect or midline shift. ORBITS: No acute abnormality. SINUSES: No acute abnormality. SOFT TISSUES AND SKULL: No acute soft tissue abnormality. No skull fracture. IMPRESSION: 1. 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 2. MRI would be helpful for more precise temporal characterization. Electronically signed by: Franky Stanford MD MD 09/21/2024 11:32 PM EST RP Workstation: [id number]   DG Thoracic Spine 2 View Result Date: 09/19/2024 EXAM: 2 VIEW(S) XRAY OF THE THORACIC SPINE 09/19/2024 06:50:00 PM  COMPARISON: None available. CLINICAL HISTORY: Fall. FINDINGS: BONES: Vertebral body heights are maintained. Alignment is normal. DISCS AND DEGENERATIVE CHANGES: Mild degenerative changes of the lower thoracic / upper lumbar spine. SOFT TISSUES: The visualized lungs are clear. IMPRESSION: 1. No acute findings. Electronically signed by: Pinkie Pebbles MD MD 09/19/2024 07:09 PM EST RP Workstation: [id number]   DG Lumbar Spine Complete Result Date: 09/19/2024 EXAM: 4 OR MORE VIEW(S) XRAY OF THE LUMBAR SPINE 09/19/2024 06:50:00 PM COMPARISON: None available. CLINICAL HISTORY: fall FINDINGS: LUMBAR SPINE: BONES: Vertebral body heights are maintained. Alignment: Mild upper lumbar dextroscoliosis. Mild superior endplate compression fracture deformity at T12, chronic. DISCS AND DEGENERATIVE CHANGES: Mild multilevel degenerative changes, most prominent at L1-L2 and L5-S1. SOFT TISSUES: No acute abnormality. IMPRESSION: 1. No acute findings. Electronically signed by: Pinkie Pebbles MD MD 09/19/2024 07:09 PM EST RP Workstation: [id number]    Micro Results    Recent Results (from the past 240 hours)  Urine Culture     Status: Abnormal   Collection Time: 09/21/24 11:56 PM   Specimen: Urine, Clean Catch  Result Value Ref Range Status   Specimen Description   Final    URINE, CLEAN CATCH Performed at [facility], [address] [place], [place] [address]    Special Requests   Final    NONE Performed at [facility], [address] [place], [place] [address]    Culture MULTIPLE SPECIES PRESENT, SUGGEST RECOLLECTION (A)  Final   Report Status 09/23/2024 FINAL  Final    Today   Subjective    Katherine Obrien today has no headache,no chest abdominal pain,no new weakness tingling or numbness, feels much better   Objective   Blood pressure 139/73, pulse 79, temperature 98.5 F (36.9 C), temperature source Oral, resp. rate 14, height 5' 4.02 (1.626 m), weight  89.9 kg, SpO2 91%.   Intake/Output Summary (Last 24 hours) at 09/26/2024 0912 Last data filed at 09/26/2024 0806 Gross per 24 hour  Intake --  Output 700 ml  Net -700 ml    Exam  Awake Alert, No new F.N deficits,    [place].AT,PERRAL Supple Neck,   Symmetrical Chest wall movement, Good air movement bilaterally, CTAB RRR,No Gallops,   +ve B.Sounds, Abd Soft, Non tender,  No Cyanosis, Clubbing or edema    Data Review   Recent Labs  Lab 09/21/24 2218 09/22/24 0601 09/26/24 0324  WBC 12.3* 11.0* 9.5  HGB 13.4 12.4 12.7  HCT 41.2 39.6 39.5  PLT 215 211 218  MCV 89.0 90.0 90.0  MCH 28.9 28.2 28.9  MCHC 32.5 31.3 32.2  RDW 14.9 14.7 14.3  LYMPHSABS 2.3  --  1.5  MONOABS 1.2*  --  0.9  EOSABS 0.5  --  0.6*  BASOSABS 0.1  --  0.0    Recent Labs  Lab 09/21/24 2218 09/22/24 0600 09/22/24 0601 09/24/24 0339 09/26/24 0324  NA 139  --  142 137 137  K 3.7  --  4.3 4.2 4.5  CL 104  --  105 100 101  CO2 26  --  29 29 28   ANIONGAP 10  --  8 9 8   GLUCOSE 105*  --  120* 92 109*  BUN 24*  --  22 19 24*  CREATININE 1.00  --  1.17* 0.88 1.08*  AST 21  --   --   --   --   ALT 14  --   --   --   --   ALKPHOS 115  --   --   --   --   BILITOT 0.4  --   --   --   --   ALBUMIN 3.6  --   --   --   --   INR  --   --   --  1.0  --   HGBA1C  --  6.1*  --   --   --   MG  --   --   --  2.0 2.1  CALCIUM  9.1  --  9.2 9.2 9.2    Total Time in preparing paper work, data evaluation and todays exam - 35 minutes  Signature  -    Lavada Stank M.D on 09/26/2024 at 9:12 AM   -  To page go to [email or web address]      "

## 2024-09-26 NOTE — TOC Transition Note (Signed)
 Transition of Care [facility]) - Discharge Note   Patient Details  Name: Katherine Obrien MRN: [id number] Date of Birth: 14-Mar-1941  Transition of Care [facility]) CM/SW Contact:  Inocente GORMAN Kindle, LCSW Phone Number: 09/26/2024, 1:36 PM   Clinical Narrative:    Patient will DC to: [facility] Anticipated DC date: 09/26/24 Family notified: Daughter Transport by: [place]   Per MD patient ready for DC to [facility] RN to call report prior to discharge [phone] (maples room 60B)). RN, patient, patient's family, and facility notified of DC. Discharge Summary and FL2 sent to facility. DC packet on chart including signed script. Ambulance transport requested for patient.   CSW will sign off for now as social work intervention is no longer needed. Please consult us  again if new needs arise.     Final next level of care: Skilled Nursing Facility Barriers to Discharge: Barriers Resolved   Patient Goals and CMS Choice Patient states their goals for this hospitalization and ongoing recovery are:: Rehab CMS Medicare.gov Compare Post Acute Care list provided to:: Patient Choice offered to / list presented to : Patient, Adult Children [place] ownership interest in [facility].provided to:: Patient    Discharge Placement   Existing PASRR number confirmed : 09/26/24          Patient chooses bed at: [facility] Patient to be transferred to facility by: [facility] Name of family member notified: Daughter Patient and family notified of of transfer: 09/26/24  Discharge Plan and Services Additional resources added to the After Visit Summary for                                       Social Drivers of Health (SDOH) Interventions SDOH Screenings   Food Insecurity: No Food Insecurity (09/22/2024)  Housing: Low Risk (09/22/2024)  Transportation Needs: No Transportation Needs (09/22/2024)  Recent Concern: Transportation Needs - Unmet Transportation Needs  (07/19/2024)  Utilities: Not At Risk (09/22/2024)  Depression (PHQ2-9): Low Risk (02/29/2024)  Social Connections: Socially Isolated (09/22/2024)  Tobacco Use: Low Risk (09/23/2024)     Readmission Risk Interventions    03/13/2023   11:23 AM  Readmission Risk Prevention Plan  Post Dischage Appt Complete  Medication Screening Complete  Transportation Screening Complete

## 2024-09-26 NOTE — TOC Progression Note (Signed)
 Transition of Care [facility]) - Progression Note    Patient Details  Name: Katherine Obrien MRN: [id number] Date of Birth: 04-27-1941  Transition of Care [facility]) CM/SW Contact  Sharyne Drum, Student-Social Work Phone Number: 09/26/2024, 10:08 AM  Clinical Narrative:     MSW Student called pt's dtr to update on d/c plans. Dtr knows it is going to be a semi private room and is okay with that. Dtr gave consent for [facility] transport back to facility. Dtr is concerend with follow up appointments due to pt stroke. CSW has arranged for nurse to help dtr with this upon d/c.   Amber at [employer] was called and updated on pt d/c and gave report.    Barriers to Discharge: Continued Medical Work up               Expected Discharge Plan and Services       Living arrangements for the past 2 months: Independent Living Facility Expected Discharge Date: 09/26/24                                     Social Drivers of Health (SDOH) Interventions SDOH Screenings   Food Insecurity: No Food Insecurity (09/22/2024)  Housing: Low Risk (09/22/2024)  Transportation Needs: No Transportation Needs (09/22/2024)  Recent Concern: Transportation Needs - Unmet Transportation Needs (07/19/2024)  Utilities: Not At Risk (09/22/2024)  Depression (PHQ2-9): Low Risk (02/29/2024)  Social Connections: Socially Isolated (09/22/2024)  Tobacco Use: Low Risk (09/23/2024)    Readmission Risk Interventions    03/13/2023   11:23 AM  Readmission Risk Prevention Plan  Post Dischage Appt Complete  Medication Screening Complete  Transportation Screening Complete

## 2024-09-26 NOTE — Progress Notes (Signed)
 Physical Therapy Treatment Patient Details Name: Katherine Obrien MRN: [id number] DOB: 05-08-1941 Today's Date: 09/26/2024   History of Present Illness 84 y.o. female recently in ED 09/19/24 with back pain after fall, now admitted from [facility] on 09/21/24 due to inability to care for self at home. Workup revealed UTI, acute hyperextension T10-T11 with fx. MRI 1/11 showed large subacute R PCA infarct. Kyphoplasty 1/14. PMH includes seizure disorder, HTN, CKDIII.    PT Comments  Pt with difficulty tolerating bed mobility and transfers to sit EOB due to back pain. Pt reports pain as 5/10 this session. Pt becomes frustrated with staff assist due to moving too fast. Pt able to slowly progress B LE over EOB. Pt benefits from cues to reach for R railing due to frequently reaching into the air looking for a solid surface. Pt progresses to upright position by requesting HOB be raised one notch at a time. Pt relies on [facility] for back support during transfer. Pt did not complete full transfer this session and remained in partially seated position EOB ~10-15 minutes without progression despite heavy cues and encouragement. Pt becomes increasingly agitated with encouragement and attempts to help. TLSO unable to be donned this session due to partially seated position.  Pt would benefit from continued PT services focused on bed mobility, posture, TLSO donning/doffing training, and transfers in order to promote tolerate to upright mobility.     If plan is discharge home, recommend the following: Two people to help with walking and/or transfers;Two people to help with bathing/dressing/bathroom;Assistance with cooking/housework;Direct supervision/assist for medications management;Direct supervision/assist for financial management;Assist for transportation;Help with stairs or ramp for entrance;Supervision due to cognitive status   Can travel by private vehicle     No  Equipment Recommendations  Rolling  walker (2 wheels);BSC/3in1;Wheelchair (measurements PT);Wheelchair cushion (measurements PT);Hospital bed    Recommendations for Other Services       Precautions / Restrictions Precautions Precautions: Fall;Back Precaution Booklet Issued: No Recall of Precautions/Restrictions: Impaired Precaution/Restrictions Comments: Following BLT precautions for pt comfort and safety. Required Braces or Orthoses: Spinal Brace Spinal Brace: Thoracolumbosacral orthotic Restrictions Weight Bearing Restrictions Per Provider Order: No     Mobility  Bed Mobility Overal bed mobility: Needs Assistance Bed Mobility: Supine to Sit, Sit to Supine     Supine to sit: Min assist, +2 for safety/equipment Sit to supine: Min assist, +2 for safety/equipment   General bed mobility comments: Initial assist for LE towards EOB though some resistance noted and pt adamant on staff not assisting. Pt gradually progresses B LE over EOB. Requests HOB to be raised one notch at a time to assist with back support. Pt easily aggitated and states we are going too fast, wants to go at my pace. Pt did not want staff assist. Pt then proceeded to sit in the same position halfway through the transfer for 10-15 minutes despite encouragement to progress towards EOB. Pt educated on benefit of completing transfer, refused, and was ultimately assisted back to bed.    Transfers                   General transfer comment: unable to progress today    Ambulation/Gait                   Stairs             Wheelchair Mobility     Tilt Bed    Modified Rankin (Stroke Patients Only)  Balance Overall balance assessment: Needs assistance Sitting-balance support: Bilateral upper extremity supported, Single extremity supported, Feet supported Sitting balance-Leahy Scale: Poor Sitting balance - Comments: Kept reaching into the air for a solid suface, declined hand held assist for seated balance. Uses  head rest of bed for trunk support while attempting to complete transfer. Does not come to unsupported sitting position EOB for this session.                                    Communication Communication Communication: Impaired Factors Affecting Communication: Hearing impaired  Cognition Arousal: Alert Behavior During Therapy: Flat affect, Agitated   PT - Cognitive impairments: Awareness, Safety/Judgement, Problem solving, Sequencing                         Following commands: Impaired Following commands impaired: Follows one step commands inconsistently, Follows one step commands with increased time    Cueing Cueing Techniques: Verbal cues, Visual cues, Gestural cues  Exercises      General Comments General comments (skin integrity, edema, etc.): VSS throughout. No new skin abnormalities noted.      Pertinent Vitals/Pain Pain Assessment Pain Assessment: 0-10 Pain Score: 5  Pain Location: Low back Pain Descriptors / Indicators: Aching, Sore, Sharp Pain Intervention(s): Limited activity within patient's tolerance, Monitored during session, Patient requesting pain meds-RN notified, Repositioned    Home Living                          Prior Function            PT Goals (current goals can now be found in the care plan section) Acute Rehab PT Goals Patient Stated Goal: Decrease pain PT Goal Formulation: With patient/family Time For Goal Achievement: 10/08/24 Potential to Achieve Goals: Good Progress towards PT goals: Progressing toward goals    Frequency    Min 2X/week      PT Plan      Co-evaluation PT/OT/SLP Co-Evaluation/Treatment: Yes Reason for Co-Treatment: Complexity of the patient's impairments (multi-system involvement);To address functional/ADL transfers PT goals addressed during session: Mobility/safety with mobility;Balance;Proper use of DME OT goals addressed during session: ADL's and self-care;Proper use of  Adaptive equipment and DME      AM-PAC PT 6 Clicks Mobility   Outcome Measure  Help needed turning from your back to your side while in a flat bed without using bedrails?: A Lot Help needed moving from lying on your back to sitting on the side of a flat bed without using bedrails?: A Lot Help needed moving to and from a bed to a chair (including a wheelchair)?: Total Help needed standing up from a chair using your arms (e.g., wheelchair or bedside chair)?: Total Help needed to walk in hospital room?: Total Help needed climbing 3-5 steps with a railing? : Total 6 Click Score: 8    End of Session   Activity Tolerance: Patient limited by pain Patient left: in bed;with call bell/phone within reach;with bed alarm set;with nursing/sitter in room Nurse Communication: Mobility status;Patient requests pain meds;Precautions PT Visit Diagnosis: Muscle weakness (generalized) (M62.81);Pain;Unsteadiness on feet (R26.81) Pain - part of body:  (Back)     Time: 9084-9051 PT Time Calculation (min) (ACUTE ONLY): 33 min  Charges:    $Therapeutic Activity: 23-37 mins PT General Charges $$ ACUTE PT VISIT: 1 Visit  Sabra Morel, PT, DPT  Acute Rehabilitation Services         Office: [phone]      Sabra MARLA Morel 09/26/2024, 11:53 AM

## 2024-09-27 ENCOUNTER — Encounter: Payer: Self-pay | Admitting: Nurse Practitioner

## 2024-09-27 ENCOUNTER — Non-Acute Institutional Stay (SKILLED_NURSING_FACILITY): Payer: Self-pay | Admitting: Nurse Practitioner

## 2024-09-27 ENCOUNTER — Other Ambulatory Visit: Payer: Self-pay | Admitting: Nurse Practitioner

## 2024-09-27 DIAGNOSIS — Z8781 Personal history of (healed) traumatic fracture: Secondary | ICD-10-CM | POA: Diagnosis not present

## 2024-09-27 DIAGNOSIS — E785 Hyperlipidemia, unspecified: Secondary | ICD-10-CM

## 2024-09-27 DIAGNOSIS — R7303 Prediabetes: Secondary | ICD-10-CM

## 2024-09-27 DIAGNOSIS — N1831 Chronic kidney disease, stage 3a: Secondary | ICD-10-CM

## 2024-09-27 DIAGNOSIS — I63531 Cerebral infarction due to unspecified occlusion or stenosis of right posterior cerebral artery: Secondary | ICD-10-CM | POA: Diagnosis not present

## 2024-09-27 DIAGNOSIS — K219 Gastro-esophageal reflux disease without esophagitis: Secondary | ICD-10-CM | POA: Diagnosis not present

## 2024-09-27 DIAGNOSIS — I10 Essential (primary) hypertension: Secondary | ICD-10-CM | POA: Diagnosis not present

## 2024-09-27 DIAGNOSIS — G40909 Epilepsy, unspecified, not intractable, without status epilepticus: Secondary | ICD-10-CM

## 2024-09-27 DIAGNOSIS — Z9889 Other specified postprocedural states: Secondary | ICD-10-CM

## 2024-09-27 DIAGNOSIS — K5901 Slow transit constipation: Secondary | ICD-10-CM | POA: Diagnosis not present

## 2024-09-27 LAB — SURGICAL PATHOLOGY

## 2024-09-27 MED ORDER — OXYCODONE HCL 5 MG PO TABS: 5.0000 mg | ORAL_TABLET | Freq: Four times a day (QID) | ORAL | 0 refills | Status: AC | PRN

## 2024-09-27 NOTE — Assessment & Plan Note (Signed)
 Stable, continue Zongran

## 2024-09-27 NOTE — Assessment & Plan Note (Signed)
 Bun/creat 24/1.08 09/26/24

## 2024-09-27 NOTE — Assessment & Plan Note (Signed)
"   LDL 130 03/05/24,  taking Rosuvastatin  now "

## 2024-09-27 NOTE — Assessment & Plan Note (Signed)
stable, taking Pantoprazole 

## 2024-09-27 NOTE — Assessment & Plan Note (Signed)
 Hgb A1c 6.3 03/05/24

## 2024-09-27 NOTE — Assessment & Plan Note (Signed)
 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

## 2024-09-27 NOTE — Assessment & Plan Note (Signed)
 Sbp in 140s, self stopped Lisinopril .

## 2024-09-27 NOTE — Assessment & Plan Note (Signed)
 Acute back pain since fall 09/19/24, MRI showed T10/T11 fx, Kyphoplasty 09/25/24, Oxycodone  for pain, f/u interventional radiology, TSLO, pain is improved.

## 2024-09-27 NOTE — Progress Notes (Unsigned)
 " Location:  [facility] Room Number: N060B Place of Service:  SNF (31) Provider:  Elva Mauro X, NP   Correen Bubolz X, NP  Patient Care Team: Mats Jeanlouis X, NP as PCP - General (Internal Medicine) Santo Stanly LABOR, MD as PCP - Cardiology (Cardiology) Lanell Starleen DASEN, MD as Referring Physician (Neurology) Roosevelt Erla Shaker, MD as Referring Physician (Dermatology)  Extended Emergency Contact Information Primary Emergency Contact: Tilley,James Address: [address]          [place], [place] [address] [place] Mobile Phone: [phone] Relation: Son Secondary Emergency Contact: Sanford,Caroline Address: [address]          [place], [place] [address] [place] Mobile Phone: [phone] Relation: Daughter  Code Status:  Full Code Goals of care: Advanced Directive information    09/22/2024   10:00 PM  Advanced Directives  Does Patient Have a Medical Advance Directive? Yes     Chief Complaint  Patient presents with   Hospitalization Follow-up    HPI:  Pt is a 85 y.o. female seen today for medication review following hospital stay  Hospitalized 09/21/2024 to 09/26/2024 for UTI(urine culture shoed multiple species), altered mental status, acute CVA due to occlusion of right posterior cerebral artery, f/u Neurology 10/27/24, repeat CBC/BMP/CXR 1 wk  09/19/2024 ED eval for s/p fall, no acute fractures of x-ray of lumbar and thoracic spine  Acute back pain since fall 09/19/24, MRI showed T10/T11 fx, Kyphoplasty 09/25/24, Oxycodone  for pain, f/u interventional radiology, TSLO, pain is improved.   Hx of CVA, acute 09/21/2024 occlusion of right posterior cerebral artery, L peripheral vision loss,  on statin, 3 months DAPT Plavis, ASA, then ASA alone  Seizures taking Zonegran   Constipation, taking Senokot  GERD, stable, taking Pantoprazole   Hospitalized 07/18/24-07/20/24 fpr seizures-unresponsive episode, UTI, CKD               The  patient has chronic knee pain, L>R, needs four wheel  Rollator rolling walker for ambulation. Currently the patient is working with therapy for gait, strengthening, and balance.              Ambulates with walker, risk of falling, knees get weak at times.  Syncope ED eval 05/13/23, unremarkable labs, CXR             Cardiology 04/05/23 in concern of syncope episode in May/hospitalization, on Furosemide  20mg  prn, no cardiac syncope or orthostatic hypotension,  Echo: tricuspid regurgitation, no RV dysfunction. F/u one year.              CT abd 4cm hypodense lesion left lobe of the liver, lesion from the left kidney, recommended US  02/15/24 1. No cholelithiasis or sonographic evidence for acute cholecystitis. 2. Increased hepatic parenchymal echogenicity suggestive of steatosis.             Seizure, ED eval for unresponsive 10-15 minutes, bit her tongue, off Depakote , saw neurology 03/28/23: placed on Zongran. Hospital 01/27/23-01/31/23 for seizure,EEG w/o seizure like activities, no antiepileptics recommended by Neurology,  LOC, CT head no acute process, more concerned of syncope etiology             HTN, Sbp in 140s, self stopped Lisinopril .              Prediabetes Hgb A1c 6.3 03/05/24 Hx of hyperlipidemia, LDL 130 03/05/24,  taking Rosuvastatin  now The patient stated she sleeps better, bathroom trip only 1x/night.  CKD Bun/creat 24/1.08 09/26/24 11/22/22 Mammogram: Further  evaluation is suggested for a possible mass in the left breast, repeat Mammogram 02/16/23 showed duct ectasia, cyst.  Edema BLE, self stopped F rosemide daily Elevated TSH 4.72 03/05/24  Past Medical History:  Diagnosis Date   Arthritis    Cancer [facility])    skin   Cataract 2010   Dr. Donn, Dr.. Leverette @ [facility]   Epilepsy [facility]) 08/11/2015   Hyperlipidemia 12/08/2015   Hypertension    Hypothyroid    Polyp of colon 08/11/2015   Past Surgical History:  Procedure Laterality Date   ABDOMINAL HYSTERECTOMY  1970   APPENDECTOMY   1958   BREAST SURGERY  1984   COLON SURGERY  09/12/2006   FEMUR SURGERY  2018   Broken   IR KYPHO THORACIC WITH BONE BIOPSY  09/25/2024   TONSILLECTOMY  09/12/1944    Allergies[1]  Outpatient Encounter Medications as of 09/27/2024  Medication Sig   acetaminophen  (TYLENOL ) 500 MG tablet Take 2 tablets (1,000 mg total) by mouth every 8 (eight) hours as needed.   aspirin  EC 81 MG tablet Take 1 tablet (81 mg total) by mouth daily. Swallow whole.   clopidogrel  (PLAVIX ) 75 MG tablet Take 1 tablet (75 mg total) by mouth daily.   Multiple Vitamins-Minerals (CENTRUM SILVER 50+WOMEN) TABS Take 1 tablet by mouth daily with breakfast.   Multiple Vitamins-Minerals (PRESERVISION AREDS 2) CAPS Take 1 capsule by mouth 2 (two) times daily.   olopatadine (PATANOL) 0.1 % ophthalmic solution Place 1 drop into both eyes every 8 (eight) hours as needed for allergies.   oxyCODONE  (OXY IR/ROXICODONE ) 5 MG immediate release tablet Take 1 tablet (5 mg total) by mouth every 6 (six) hours as needed for severe pain (pain score 7-10) or breakthrough pain.   pantoprazole  (PROTONIX ) 40 MG tablet Take 1 tablet (40 mg total) by mouth daily.   rosuvastatin  (CRESTOR ) 20 MG tablet Take 1 tablet (20 mg total) by mouth daily.   senna-docusate (SENOKOT-S) 8.6-50 MG tablet Take 1 tablet by mouth at bedtime as needed for mild constipation.   zonisamide  (ZONEGRAN ) 100 MG capsule Take 1 capsule (100 mg total) by mouth at bedtime.   [DISCONTINUED] Cholecalciferol (VITAMIN D -3) 25 MCG (1000 UT) CAPS TAKE 1 CAPSULE DAILY WITH BREAKFAST. (Patient taking differently: Take 1,000 Units by mouth daily with breakfast.)   [DISCONTINUED] OVER THE COUNTER MEDICATION Place 1 drop into both eyes See admin instructions. [employer] Advanced Eye Relief Dry Eye Rejuvenation Lubricant Eye Drops - Instill 1 drop into both eyes three times a day as needed for dryness (Patient not taking: Reported on 09/27/2024)   No  facility-administered encounter medications on file as of 09/27/2024.    Review of Systems  Constitutional:  Positive for fatigue. Negative for appetite change and fever.  HENT:  Positive for hearing loss. Negative for congestion and trouble swallowing.   Eyes:  Positive for visual disturbance.       Left peripheral vision loss from CVA 09/21/24  Respiratory:  Negative for cough, chest tightness, shortness of breath and wheezing.   Cardiovascular:  Negative for leg swelling.  Gastrointestinal:  Negative for abdominal pain.  Genitourinary:  Positive for frequency. Negative for dysuria and urgency.  Musculoskeletal:  Positive for arthralgias, back pain and gait problem.       T10/T11 kyphoplasty 09/25/24  Skin:  Negative for color change.  Neurological:  Negative for seizures, weakness and headaches.  Psychiatric/Behavioral:  Positive for confusion. Negative for sleep disturbance. The patient is not nervous/anxious.  Immunization History  Administered Date(s) Administered   Fluad Quad(high Dose 65+) 07/03/2020, 06/28/2021, 06/28/2022   INFLUENZA, HIGH DOSE SEASONAL PF 06/26/2019   Influenza-Unspecified 06/29/2015, 05/21/2016   Moderna Covid-19 Vaccine  Bivalent Booster 38yrs & up 07/14/2022, 05/27/2023   Moderna Sars-Covid-2 Vaccination 09/16/2019, 10/14/2019, 07/21/2020, 01/21/2021, 01/28/2022   PFIZER(Purple Top)SARS-COV-2 Vaccination 06/01/2021   Pneumococcal Conjugate-13 01/19/2014   Pneumococcal Polysaccharide-23 07/28/2009   Respiratory Syncytial Virus Vaccine,Recomb Aduvanted(Arexvy) 09/09/2022   Td 07/02/1991, 02/02/1996, 09/25/1996, 07/25/2006   Tdap 12/22/2015   Unspecified SARS-COV-2 Vaccination 06/13/2024   Zoster Recombinant(Shingrix) 05/27/2017, 06/20/2017, 11/24/2017   Pertinent  Health Maintenance Due  Topic Date Due   Influenza Vaccine  Completed   Bone Density Scan  Completed      04/13/2023    1:04 PM 08/31/2023    1:30 PM 11/02/2023    3:33 PM  02/29/2024    3:19 PM 09/19/2024    1:08 PM  Fall Risk  Falls in the past year? 0 0 1 1 0  Was there an injury with Fall? 0  0  0  0  0  Fall Risk Category Calculator 0 0 2 1 0  Patient at Risk for Falls Due to No Fall Risks  History of fall(s) No Fall Risks No Fall Risks  Fall risk Follow up Falls evaluation completed  Falls evaluation completed Falls evaluation completed Falls evaluation completed     Data saved with a previous flowsheet row definition   Functional Status Survey:    Vitals:   09/27/24 1034  BP: (!) 148/80  Pulse: 99  Resp: (!) 22  Temp: 98.6 F (37 C)  SpO2: 92%  Height: 5' 4.02 (1.626 m)   Body mass index is 34 kg/m. Physical Exam Vitals and nursing note reviewed.  Constitutional:      Comments: fatigue  HENT:     Head: Normocephalic and atraumatic.     Nose: Nose normal.     Mouth/Throat:     Mouth: Mucous membranes are moist.  Eyes:     Extraocular Movements: Extraocular movements intact.     Conjunctiva/sclera: Conjunctivae normal.     Pupils: Pupils are equal, round, and reactive to light.  Cardiovascular:     Rate and Rhythm: Normal rate and regular rhythm.     Heart sounds: No murmur heard. Pulmonary:     Effort: Pulmonary effort is normal.     Breath sounds: Rales present.     Comments: Rales posterior lung base.  Abdominal:     General: Bowel sounds are normal.     Palpations: Abdomen is soft.     Tenderness: There is no abdominal tenderness.  Musculoskeletal:        General: Tenderness present.     Cervical back: Normal range of motion and neck supple.     Right lower leg: No edema.     Left lower leg: No edema.     Comments: T10/T11 s/p kyphoplasty 09/25/24  Skin:    General: Skin is warm and dry.  Neurological:     Mental Status: She is alert and oriented to person, place, and time. Mental status is at baseline.     Motor: No weakness.     Coordination: Coordination normal.     Gait: Gait abnormal.     Comments: Left  peripheral vision loss since CVA 09/21/24  Psychiatric:        Mood and Affect: Mood normal.        Behavior: Behavior normal.  Thought Content: Thought content normal.     Labs reviewed: Recent Labs    09/22/24 0601 09/24/24 0339 09/26/24 0324  NA 142 137 137  K 4.3 4.2 4.5  CL 105 100 101  CO2 29 29 28   GLUCOSE 120* 92 109*  BUN 22 19 24*  CREATININE 1.17* 0.88 1.08*  CALCIUM  9.2 9.2 9.2  MG  --  2.0 2.1   Recent Labs    03/05/24 0709 07/18/24 1719 09/19/24 0000 09/21/24 2218  AST 14 36 13 21  ALT 15 25 14 14   ALKPHOS  --  125 121 115  BILITOT 0.3 0.2  --  0.4  PROT 6.9 7.7  --  7.3  ALBUMIN  --  3.8 4.0 3.6   Recent Labs    09/19/24 0000 09/21/24 2218 09/22/24 0601 09/26/24 0324  WBC 9.6 12.3* 11.0* 9.5  NEUTROABS 6,586.00 8.2*  --  6.4  HGB 12.9 13.4 12.4 12.7  HCT 40 41.2 39.6 39.5  MCV  --  89.0 90.0 90.0  PLT 279 215 211 218   Lab Results  Component Value Date   TSH 4.72 (H) 03/05/2024   Lab Results  Component Value Date   HGBA1C 6.1 (H) 09/22/2024   Lab Results  Component Value Date   CHOL 173 09/23/2024   HDL 47 09/23/2024   LDLCALC 108 (H) 09/23/2024   TRIG 88 09/23/2024   CHOLHDL 3.7 09/23/2024    Significant Diagnostic Results in last 30 days:  MR BRAIN WO CONTRAST Result Date: 09/22/2024 EXAM: MRI BRAIN WITHOUT CONTRAST 09/22/2024 11:18:41 AM TECHNIQUE: Multiplanar multisequence MRI of the head/brain was performed without the administration of intravenous contrast. COMPARISON: Head CT 09/21/2024. CLINICAL HISTORY: Stroke. FINDINGS: BRAIN AND VENTRICLES: A large subacute right PCA infarct is again seen with associated restricted diffusion, cytotoxic edema, cortical laminar necrosis, and likely minimal petechial blood products. No mass, midline shift, extra-axial fluid collection, or hydrocephalus is evident. T2 hyperintensities in the cerebral white matter bilaterally are nonspecific but compatible with mild chronic small vessel  ischemic disease. There is mild generalized cerebral atrophy. Major intracranial vascular flow voids are preserved. ORBITS: Bilateral cataract extraction. SINUSES AND MASTOIDS: Minimal mucosal thickening in the paranasal sinuses. Clear mastoid air cells. BONES AND SOFT TISSUES: Normal marrow signal. No significant soft tissue abnormality. IMPRESSION: 1. Large subacute right PCA infarct. 2. Mild chronic small vessel ischemic disease. Electronically signed by: Dasie Hamburg MD MD 09/22/2024 11:49 AM EST RP Workstation: [id number]   CT CERVICAL SPINE WO CONTRAST Result Date: 09/21/2024 EXAM: CT CERVICAL SPINE WITHOUT CONTRAST 09/21/2024 11:27:03 PM TECHNIQUE: CT of the cervical spine was performed without the administration of intravenous contrast. Multiplanar reformatted images are provided for review. Automated exposure control, iterative reconstruction, and/or weight based adjustment of the mA/kV was utilized to reduce the radiation dose to as low as reasonably achievable. COMPARISON: None available. CLINICAL HISTORY: fall FINDINGS: BONES AND ALIGNMENT: No acute fracture or traumatic malalignment. DEGENERATIVE CHANGES: Degenerative disc disease is greatest at C5-C6, where there is endplate sclerosis and uncovertebral spurring. Lytic atherosclerosis of the facet. No high-grade cervical spinal canal stenosis. SOFT TISSUES: No prevertebral soft tissue swelling. IMPRESSION: 1. Degenerative disc disease greatest at C5-C6 with endplate sclerosis and uncovertebral spurring. 2. No high-grade cervical spinal canal stenosis. Electronically signed by: Franky Stanford MD MD 09/21/2024 11:45 PM EST RP Workstation: [id number]   CT Thoracic Spine Wo Contrast Result Date: 09/21/2024 EXAM: CT THORACIC SPINE WITHOUT CONTRAST 09/21/2024 11:27:03 PM TECHNIQUE: CT of the thoracic spine was  performed without the administration of intravenous contrast. Multiplanar reformatted images are provided for review. Automated exposure control,  iterative reconstruction, and/or weight based adjustment of the mA/kV was utilized to reduce the radiation dose to as low as reasonably achievable. COMPARISON: None available. CLINICAL HISTORY: Mid-back pain. FINDINGS: BONES AND ALIGNMENT: Vertebral body heights are maintained except for the superior T11 endplate, which is fractured and uplifted. At T10-T11, there is evidence of acute hyperextension injury with anterior widening of the T10-T11 disc space. There is no visible osseous extension of the fracture into the posterior elements. According to the AO Spine classification of thoracolumbar injuries, the finding is consistent with a T10-T11: B3 hyperextension distraction injury (with associated anterior endplate fracture at T11) and the recommendation is not specified by this descriptive classification alone. No suspicious bone lesion. DEGENERATIVE CHANGES: There are flowing anterior syndesmophytes along the length of the thoracic spine. SOFT TISSUES: No acute abnormality. IMPRESSION: 1. Acute hyperextension injury at T10-T11 with fracture and uplifting of the superior T11 endplate and anterior widening of the T10-11 disc space, consistent with a T10-T11 B3 hyperextension distraction injury per AO Spine classification. 2. Thoracic ankylosis Electronically signed by: Franky Stanford MD MD 09/21/2024 11:40 PM EST RP Workstation: [id number]   CT Lumbar Spine Wo Contrast Result Date: 09/21/2024 EXAM: CT OF THE LUMBAR SPINE WITHOUT CONTRAST 09/21/2024 11:27:03 PM TECHNIQUE: CT of the lumbar spine was performed without the administration of intravenous contrast. Multiplanar reformatted images are provided for review. Automated exposure control, iterative reconstruction, and/or weight based adjustment of the mA/kV was utilized to reduce the radiation dose to as low as reasonably achievable. COMPARISON: None available. CLINICAL HISTORY: Low back pain, trauma. FINDINGS: BONES AND ALIGNMENT: Normal vertebral body heights.  No acute fracture or suspicious bone lesion. Grade 1 retrolisthesis at L2-L3 and L3-L4. Lumbar levoscoliosis with apex at L4. DEGENERATIVE CHANGES: Multilevel degenerative disc disease with disc space narrowing and mild endplate remodeling. Multilevel severe lower lumbar facet arthrosis. Moderate spinal canal stenosis at L3-L4, L4-L5, and L5-S1. Moderate bilateral L5 neural foraminal stenosis. SOFT TISSUES: Calcific aortic sclerosis. Cholelithiasis. No acute abnormality. IMPRESSION: 1. No evidence of acute traumatic injury. 2. Moderate spinal canal stenosis at L3-L4, L4-L5, and L5-S1. 3. Moderate bilateral L5 neural foraminal stenosis. Electronically signed by: Franky Stanford MD MD 09/21/2024 11:35 PM EST RP Workstation: [id number]   CT HEAD WO CONTRAST ( ) Result Date: 09/21/2024 EXAM: CT HEAD WITHOUT CONTRAST 09/21/2024 11:27:03 PM TECHNIQUE: CT of the head was performed without the administration of intravenous contrast. Automated exposure control, iterative reconstruction, and/or weight based adjustment of the mA/kV was utilized to reduce the radiation dose to as low as reasonably achievable. COMPARISON: 07/18/2024 CLINICAL HISTORY: Delirium FINDINGS: BRAIN AND VENTRICLES: No acute hemorrhage. No evidence of acute infarct. Since the prior study, encephalomalacia has developed in the right PCA territory. This appears late subacute. MRI would be helpful for better temporal characterization. No hydrocephalus. No extra-axial collection. No mass effect or midline shift. ORBITS: No acute abnormality. SINUSES: No acute abnormality. SOFT TISSUES AND SKULL: No acute soft tissue abnormality. No skull fracture. IMPRESSION: 1. 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 2. MRI would be helpful for more precise temporal characterization. Electronically signed by: Franky Stanford MD MD 09/21/2024 11:32 PM EST RP Workstation: [id number]    Assessment/Plan HTN  (hypertension) Sbp in 140s, self stopped Lisinopril .   Prediabetes Hgb A1c 6.3 03/05/24  Hyperlipidemia  LDL 130 03/05/24,  taking Rosuvastatin  now  Chronic  kidney disease, stage 3a (HCC) Bun/creat 24/1.08 09/26/24  Seizure disorder (HCC) Stable, continue Zongran  GERD (gastroesophageal reflux disease) stable, taking Pantoprazole   Slow transit constipation Stable,  taking Senokot  Acute cerebrovascular accident (CVA) due to occlusion of right posterior cerebral artery (HCC) Hx of CVA, acute 09/21/2024 occlusion of right posterior cerebral artery, L peripheral vision loss,  on statin, 3 months DAPT Plavis, ASA, then ASA alone  Hx of vertebral fracture repair Acute back pain since fall 09/19/24, MRI showed T10/T11 fx, Kyphoplasty 09/25/24, Oxycodone  for pain, f/u interventional radiology, TSLO, pain is improved.     Family/ staff Communication: plan of care reviewed with the patient and charge nurse.   Labs/tests ordered: CBC, BMP, CXR 1 week          [1] Allergies Allergen Reactions   Barbiturates Other (See Comments)    A small amount overdosed the patient- affected my stability and balance (might have been given at the same as another med?)   Cefdinir Other (See Comments)    A small amount overdosed the patient- affected my stability and balance (might have been given at the same as another med?)   Codeine Other (See Comments)    Moderate, per pharmacy   Phenobarbital  Other (See Comments)    A small amount overdosed the patient- affected my stability and balance (might have been given at the same as another med?)   Tegretol  [Carbamazepine ] Other (See Comments)    A small amount overdosed the patient- affected my stability and balance (might have been given at the same as another med?)   Tramadol Other (See Comments)    Moderate, per pharmacy  "

## 2024-09-27 NOTE — Assessment & Plan Note (Signed)
 Stable,  taking Senokot

## 2024-10-02 ENCOUNTER — Non-Acute Institutional Stay (SKILLED_NURSING_FACILITY): Payer: Self-pay | Admitting: Family Medicine

## 2024-10-02 DIAGNOSIS — G40909 Epilepsy, unspecified, not intractable, without status epilepticus: Secondary | ICD-10-CM | POA: Diagnosis not present

## 2024-10-02 DIAGNOSIS — I63531 Cerebral infarction due to unspecified occlusion or stenosis of right posterior cerebral artery: Secondary | ICD-10-CM

## 2024-10-02 DIAGNOSIS — K219 Gastro-esophageal reflux disease without esophagitis: Secondary | ICD-10-CM | POA: Diagnosis not present

## 2024-10-02 DIAGNOSIS — Z9889 Other specified postprocedural states: Secondary | ICD-10-CM | POA: Diagnosis not present

## 2024-10-02 DIAGNOSIS — Z8781 Personal history of (healed) traumatic fracture: Secondary | ICD-10-CM

## 2024-10-02 NOTE — Progress Notes (Signed)
 " Provider:  Garnette Pinal, MD Location:      Place of Service:     PCP: Mast, Man X, NP Patient Care Team: Mast, Man X, NP as PCP - General (Internal Medicine) Santo Stanly LABOR, MD as PCP - Cardiology (Cardiology) Lanell Starleen DASEN, MD as Referring Physician (Neurology) Roosevelt, Erla Shaker, MD as Referring Physician (Dermatology)  Extended Emergency Contact Information Primary Emergency Contact: Mantel,James Address: [address]          [place], [place] [address] [place] Mobile Phone: [phone] Relation: Son Secondary Emergency Contact: Hefty,Caroline Address: [address]          [place], [place] [address] [place] Mobile Phone: [phone] Relation: Daughter  Code Status:  Goals of Care: Advanced Directive information    09/22/2024   10:00 PM  Advanced Directives  Does Patient Have a Medical Advance Directive? Yes     HPI: Patient is a 84 y.o. female seen today for admission to [facility].  Patient was admitted to the hospital on 09/21/2024 and discharged on 09/26/2024.  Discharge diagnosis included acute cerebrovascular accident due to occlusion of right posterior cerebral artery, altered mental status, urinary tract infection and fracture of T11 vertebrae. Patient was living independently at friend's home suffered a witnessed fall on 1 8.  She did not hit her head but started having back pain.  Was sent to the emergency department where initial x-rays were negative but she continued to have pain with some confusion and was taken back to the hospital where subsequent workup showed stroke without focal deficits, UTI with mild metabolic encephalopathy and fracture of T10-T11. Course in the hospital was marked by kyphoplasty, consultation by neurology with full stroke workup.  She was started on DAPT on 115 to be continued for 3 months  Discharge medications included Zonegran  for seizure disorder, rosuvastatin  for lipids  clopidogrel  gel for stroke PreserVision for macular degeneration, oxycodone  for pain pantoprazole  for GERD, and senna S for constipation Past Medical History:  Diagnosis Date   Arthritis    Cancer [facility])    skin   Cataract 2010   Dr. Donn, DrRONITA Lesch @ [facility]   Epilepsy [facility]) 08/11/2015   Hyperlipidemia 12/08/2015   Hypertension    Hypothyroid    Polyp of colon 08/11/2015   Past Surgical History:  Procedure Laterality Date   ABDOMINAL HYSTERECTOMY  1970   APPENDECTOMY  1958   BREAST SURGERY  1984   COLON SURGERY  09/12/2006   FEMUR SURGERY  2018   Broken   IR KYPHO THORACIC WITH BONE BIOPSY  09/25/2024   TONSILLECTOMY  09/12/1944    reports that she has never smoked. She has never used smokeless tobacco. She reports that she does not drink alcohol  and does not use drugs. Social History   Socioeconomic History   Marital status: Widowed    Spouse name: Not on file   Number of children: 2   Years of education: Not on file   Highest education level: Not on file  Occupational History   Occupation: Contractor    Comment: retired  Tobacco Use   Smoking status: Never   Smokeless tobacco: Never  Vaping Use   Vaping status: Never Used  Substance and Sexual Activity   Alcohol  use: Never   Drug use: Never   Sexual activity: Not Currently  Other Topics Concern   Not on file  Social History Narrative      Right handed  Caffeine- 4 cups      Social History     Socioeconomic History       Marital status: Widowed       Number of children:2       Years of education:  AB in Art [phone])       Highest education level: Bachelors in      Occupational History : Systems Developer strain: Not on file       Food insecurity:         Worry: Not on file         Inability: Not on file       Transportation needs:         Medical: Not on file         Non-medical: Not on file     Tobacco Use       Smoking status: Not on file      Substance and Sexual Activity       Alcohol  use: Not on file       Drug use: Not on file       Sexual activity: Not on file     Lifestyle       Physical activity:         Days per week: Not on file         Minutes per session: A little       Stress: Not on file     Relationships       Social connections:         Talks on phone: Not on file         Gets together: Not on file         Attends religious service: Not on file         Active member of club or organization: Not on file         Attends meetings of clubs or organizations: Not on file         Relationship status: Not on file       Intimate partner violence:         Fear of current or ex partner: Not on file         Emotionally abused: Not on file         Physically abused: Not on file         Forced sexual activity: Not on file     Other Topics       Concerns:         Not on file     Social History Narrative       Not on file      Social Drivers of Health   Tobacco Use: Low Risk (09/27/2024)   Patient History    Smoking Tobacco Use: Never    Smokeless Tobacco Use: Never    Passive Exposure: Not on file  Financial Resource Strain: Not on file  Food Insecurity: No Food Insecurity (09/22/2024)   Epic    Worried About Programme Researcher, Broadcasting/film/video in the Last Year: Never true    Ran Out of Food in the Last Year: Never true  Transportation Needs: No Transportation Needs (09/22/2024)   Epic    Lack of Transportation (Medical): No    Lack of Transportation (Non-Medical): No  Recent Concern: Transportation Needs - Unmet Transportation Needs (07/19/2024)  Epic    Lack of Transportation (Medical): Yes    Lack of Transportation (Non-Medical): Yes  Physical Activity: Not on file  Stress: Not on file  Social Connections: Socially Isolated (09/22/2024)   Social Connection and Isolation Panel    Frequency of Communication with Friends and Family: Once a week    Frequency of Social Gatherings with Friends and Family: Once a week     Attends Religious Services: Never    Database Administrator or Organizations: Yes    Attends Banker Meetings: 1 to 4 times per year    Marital Status: Widowed  Intimate Partner Violence: Not At Risk (09/22/2024)   Epic    Fear of Current or Ex-Partner: No    Emotionally Abused: No    Physically Abused: No    Sexually Abused: No  Depression (PHQ2-9): Low Risk (02/29/2024)   Depression (PHQ2-9)    PHQ-2 Score: 0  Alcohol  Screen: Not on file  Housing: Low Risk (09/22/2024)   Epic    Unable to Pay for Housing in the Last Year: No    Number of Times Moved in the Last Year: 0    Homeless in the Last Year: No  Utilities: Not At Risk (09/22/2024)   Epic    Threatened with loss of utilities: No  Health Literacy: Not on file    Functional Status Survey:    Family History  Problem Relation Age of Onset   Osteoporosis Mother    Dementia Mother    Arthritis Mother    Stroke Father 75       mini   Dementia Father    CVA Father    Breast cancer Sister    Cancer Sister        breast   Dementia Sister        severe   Alzheimer's disease Sister    Diabetes Sister    Hypertension Sister    Stroke Paternal Grandfather        1970    Health Maintenance  Topic Date Due   Medicare Annual Wellness (AWV)  11/17/2023   COVID-19 Vaccine (10 - Moderna risk 2025-26 season) 12/12/2024   DTaP/Tdap/Td (6 - Td or Tdap) 12/21/2025   Pneumococcal Vaccine: 50+ Years  Completed   Influenza Vaccine  Completed   Bone Density Scan  Completed   Zoster Vaccines- Shingrix  Completed   Meningococcal B Vaccine  Aged Out    Allergies[1]  Outpatient Encounter Medications as of 10/02/2024  Medication Sig   acetaminophen  (TYLENOL ) 500 MG tablet Take 2 tablets (1,000 mg total) by mouth every 8 (eight) hours as needed.   aspirin  EC 81 MG tablet Take 1 tablet (81 mg total) by mouth daily. Swallow whole.   clopidogrel  (PLAVIX ) 75 MG tablet Take 1 tablet (75 mg total) by mouth daily.    Multiple Vitamins-Minerals (CENTRUM SILVER 50+WOMEN) TABS Take 1 tablet by mouth daily with breakfast.   Multiple Vitamins-Minerals (PRESERVISION AREDS 2) CAPS Take 1 capsule by mouth 2 (two) times daily.   olopatadine (PATANOL) 0.1 % ophthalmic solution Place 1 drop into both eyes every 8 (eight) hours as needed for allergies.   oxyCODONE  (OXY IR/ROXICODONE ) 5 MG immediate release tablet Take 1 tablet (5 mg total) by mouth every 6 (six) hours as needed for up to 7 days for severe pain (pain score 7-10) or breakthrough pain.   pantoprazole  (PROTONIX ) 40 MG tablet Take 1 tablet (40 mg total) by mouth daily.   rosuvastatin  (  CRESTOR ) 20 MG tablet Take 1 tablet (20 mg total) by mouth daily.   senna-docusate (SENOKOT-S) 8.6-50 MG tablet Take 1 tablet by mouth at bedtime as needed for mild constipation.   zonisamide  (ZONEGRAN ) 100 MG capsule Take 1 capsule (100 mg total) by mouth at bedtime.   No facility-administered encounter medications on file as of 10/02/2024.    Review of Systems  Constitutional:  Positive for activity change and fatigue.  HENT: Negative.    Respiratory: Negative.    Cardiovascular: Negative.   Gastrointestinal: Negative.   Endocrine: Negative.   Genitourinary:  Positive for dysuria.  Musculoskeletal:  Positive for back pain and gait problem.  Psychiatric/Behavioral: Negative.    All other systems reviewed and are negative.   There were no vitals filed for this visit. There is no height or weight on file to calculate BMI. Physical Exam Vitals and nursing note reviewed.  Constitutional:      Appearance: Normal appearance.  HENT:     Head: Normocephalic.     Mouth/Throat:     Mouth: Mucous membranes are moist.     Pharynx: Oropharynx is clear.  Eyes:     Extraocular Movements: Extraocular movements intact.     Pupils: Pupils are equal, round, and reactive to light.  Cardiovascular:     Rate and Rhythm: Normal rate and regular rhythm.  Pulmonary:     Effort:  Pulmonary effort is normal.     Breath sounds: Normal breath sounds.  Abdominal:     General: Bowel sounds are normal.     Palpations: Abdomen is soft.  Musculoskeletal:        General: Tenderness present.     Cervical back: Normal range of motion.  Skin:    General: Skin is warm and dry.  Neurological:     General: No focal deficit present.     Mental Status: She is alert and oriented to person, place, and time.  Psychiatric:        Mood and Affect: Mood normal.        Behavior: Behavior normal.     Labs reviewed: Basic Metabolic Panel: Recent Labs    09/22/24 0601 09/24/24 0339 09/26/24 0324  NA 142 137 137  K 4.3 4.2 4.5  CL 105 100 101  CO2 29 29 28   GLUCOSE 120* 92 109*  BUN 22 19 24*  CREATININE 1.17* 0.88 1.08*  CALCIUM  9.2 9.2 9.2  MG  --  2.0 2.1   Liver Function Tests: Recent Labs    03/05/24 0709 07/18/24 1719 09/19/24 0000 09/21/24 2218  AST 14 36 13 21  ALT 15 25 14 14   ALKPHOS  --  125 121 115  BILITOT 0.3 0.2  --  0.4  PROT 6.9 7.7  --  7.3  ALBUMIN  --  3.8 4.0 3.6   Recent Labs    07/18/24 1719  LIPASE 77*   No results for input(s): AMMONIA in the last 8760 hours. CBC: Recent Labs    09/19/24 0000 09/21/24 2218 09/22/24 0601 09/26/24 0324  WBC 9.6 12.3* 11.0* 9.5  NEUTROABS 6,586.00 8.2*  --  6.4  HGB 12.9 13.4 12.4 12.7  HCT 40 41.2 39.6 39.5  MCV  --  89.0 90.0 90.0  PLT 279 215 211 218   Cardiac Enzymes: No results for input(s): CKTOTAL, CKMB, CKMBINDEX, TROPONINI in the last 8760 hours. BNP: Invalid input(s): POCBNP Lab Results  Component Value Date   HGBA1C 6.1 (H) 09/22/2024   Lab Results  Component Value Date   TSH 4.72 (H) 03/05/2024   Lab Results  Component Value Date   VITAMINB12 769 10/04/2022   No results found for: FOLATE No results found for: IRON, TIBC, FERRITIN  Imaging and Procedures obtained prior to SNF admission: MR BRAIN WO CONTRAST Result Date: 09/22/2024 EXAM: MRI  BRAIN WITHOUT CONTRAST 09/22/2024 11:18:41 AM TECHNIQUE: Multiplanar multisequence MRI of the head/brain was performed without the administration of intravenous contrast. COMPARISON: Head CT 09/21/2024. CLINICAL HISTORY: Stroke. FINDINGS: BRAIN AND VENTRICLES: A large subacute right PCA infarct is again seen with associated restricted diffusion, cytotoxic edema, cortical laminar necrosis, and likely minimal petechial blood products. No mass, midline shift, extra-axial fluid collection, or hydrocephalus is evident. T2 hyperintensities in the cerebral white matter bilaterally are nonspecific but compatible with mild chronic small vessel ischemic disease. There is mild generalized cerebral atrophy. Major intracranial vascular flow voids are preserved. ORBITS: Bilateral cataract extraction. SINUSES AND MASTOIDS: Minimal mucosal thickening in the paranasal sinuses. Clear mastoid air cells. BONES AND SOFT TISSUES: Normal marrow signal. No significant soft tissue abnormality. IMPRESSION: 1. Large subacute right PCA infarct. 2. Mild chronic small vessel ischemic disease. Electronically signed by: Dasie Hamburg MD MD 09/22/2024 11:49 AM EST RP Workstation: [id number]   CT CERVICAL SPINE WO CONTRAST Result Date: 09/21/2024 EXAM: CT CERVICAL SPINE WITHOUT CONTRAST 09/21/2024 11:27:03 PM TECHNIQUE: CT of the cervical spine was performed without the administration of intravenous contrast. Multiplanar reformatted images are provided for review. Automated exposure control, iterative reconstruction, and/or weight based adjustment of the mA/kV was utilized to reduce the radiation dose to as low as reasonably achievable. COMPARISON: None available. CLINICAL HISTORY: fall FINDINGS: BONES AND ALIGNMENT: No acute fracture or traumatic malalignment. DEGENERATIVE CHANGES: Degenerative disc disease is greatest at C5-C6, where there is endplate sclerosis and uncovertebral spurring. Lytic atherosclerosis of the facet. No high-grade cervical  spinal canal stenosis. SOFT TISSUES: No prevertebral soft tissue swelling. IMPRESSION: 1. Degenerative disc disease greatest at C5-C6 with endplate sclerosis and uncovertebral spurring. 2. No high-grade cervical spinal canal stenosis. Electronically signed by: Franky Stanford MD MD 09/21/2024 11:45 PM EST RP Workstation: [id number]   CT Thoracic Spine Wo Contrast Result Date: 09/21/2024 EXAM: CT THORACIC SPINE WITHOUT CONTRAST 09/21/2024 11:27:03 PM TECHNIQUE: CT of the thoracic spine was performed without the administration of intravenous contrast. Multiplanar reformatted images are provided for review. Automated exposure control, iterative reconstruction, and/or weight based adjustment of the mA/kV was utilized to reduce the radiation dose to as low as reasonably achievable. COMPARISON: None available. CLINICAL HISTORY: Mid-back pain. FINDINGS: BONES AND ALIGNMENT: Vertebral body heights are maintained except for the superior T11 endplate, which is fractured and uplifted. At T10-T11, there is evidence of acute hyperextension injury with anterior widening of the T10-T11 disc space. There is no visible osseous extension of the fracture into the posterior elements. According to the AO Spine classification of thoracolumbar injuries, the finding is consistent with a T10-T11: B3 hyperextension distraction injury (with associated anterior endplate fracture at T11) and the recommendation is not specified by this descriptive classification alone. No suspicious bone lesion. DEGENERATIVE CHANGES: There are flowing anterior syndesmophytes along the length of the thoracic spine. SOFT TISSUES: No acute abnormality. IMPRESSION: 1. Acute hyperextension injury at T10-T11 with fracture and uplifting of the superior T11 endplate and anterior widening of the T10-11 disc space, consistent with a T10-T11 B3 hyperextension distraction injury per AO Spine classification. 2. Thoracic ankylosis Electronically signed by: Franky Stanford MD MD  09/21/2024 11:40 PM EST RP Workstation:  [id number]   CT Lumbar Spine Wo Contrast Result Date: 09/21/2024 EXAM: CT OF THE LUMBAR SPINE WITHOUT CONTRAST 09/21/2024 11:27:03 PM TECHNIQUE: CT of the lumbar spine was performed without the administration of intravenous contrast. Multiplanar reformatted images are provided for review. Automated exposure control, iterative reconstruction, and/or weight based adjustment of the mA/kV was utilized to reduce the radiation dose to as low as reasonably achievable. COMPARISON: None available. CLINICAL HISTORY: Low back pain, trauma. FINDINGS: BONES AND ALIGNMENT: Normal vertebral body heights. No acute fracture or suspicious bone lesion. Grade 1 retrolisthesis at L2-L3 and L3-L4. Lumbar levoscoliosis with apex at L4. DEGENERATIVE CHANGES: Multilevel degenerative disc disease with disc space narrowing and mild endplate remodeling. Multilevel severe lower lumbar facet arthrosis. Moderate spinal canal stenosis at L3-L4, L4-L5, and L5-S1. Moderate bilateral L5 neural foraminal stenosis. SOFT TISSUES: Calcific aortic sclerosis. Cholelithiasis. No acute abnormality. IMPRESSION: 1. No evidence of acute traumatic injury. 2. Moderate spinal canal stenosis at L3-L4, L4-L5, and L5-S1. 3. Moderate bilateral L5 neural foraminal stenosis. Electronically signed by: Franky Stanford MD MD 09/21/2024 11:35 PM EST RP Workstation: [id number]   CT HEAD WO CONTRAST ( ) Result Date: 09/21/2024 EXAM: CT HEAD WITHOUT CONTRAST 09/21/2024 11:27:03 PM TECHNIQUE: CT of the head was performed without the administration of intravenous contrast. Automated exposure control, iterative reconstruction, and/or weight based adjustment of the mA/kV was utilized to reduce the radiation dose to as low as reasonably achievable. COMPARISON: 07/18/2024 CLINICAL HISTORY: Delirium FINDINGS: BRAIN AND VENTRICLES: No acute hemorrhage. No evidence of acute infarct. Since the prior study, encephalomalacia has developed in  the right PCA territory. This appears late subacute. MRI would be helpful for better temporal characterization. No hydrocephalus. No extra-axial collection. No mass effect or midline shift. ORBITS: No acute abnormality. SINUSES: No acute abnormality. SOFT TISSUES AND SKULL: No acute soft tissue abnormality. No skull fracture. IMPRESSION: 1. 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 2. MRI would be helpful for more precise temporal characterization. Electronically signed by: Franky Stanford MD MD 09/21/2024 11:32 PM EST RP Workstation: [id number]    Assessment/Plan Assessment & Plan Acute cerebrovascular accident (CVA) due to occlusion of right posterior cerebral artery (HCC) No obvious residual.  Patient will remain on DAPT for 3 months and then aspirin .  Being seen in PT status post hospitalization Gastroesophageal reflux disease, unspecified whether esophagitis present Continue pantoprazole  Seizure disorder (HCC) No seizures recent history Hx of vertebral fracture repair Had kyphoplasty on 09/25/2024.  Still has oxycodone  available for pain.  Follow-up with interventional radiology   Family/ staff Communication:   Labs/tests ordered:  Garnette HERO. Cleotilde, MD [facility] [address] [place], [place] [address] Office [phone]      [1]  Allergies Allergen Reactions   Barbiturates Other (See Comments)    A small amount overdosed the patient- affected my stability and balance (might have been given at the same as another med?)   Cefdinir Other (See Comments)    A small amount overdosed the patient- affected my stability and balance (might have been given at the same as another med?)   Codeine Other (See Comments)    Moderate, per pharmacy   Phenobarbital  Other (See Comments)    A small amount overdosed the patient- affected my stability and balance (might have been given at the same as another med?)    Tegretol  [Carbamazepine ] Other (See Comments)    A small amount overdosed the patient- affected my stability and balance (might have  been given at the same as another med?)   Tramadol Other (See Comments)    Moderate, per pharmacy   "

## 2024-10-02 NOTE — Assessment & Plan Note (Addendum)
 No obvious residual.  Patient will remain on DAPT for 3 months and then aspirin .  Being seen in PT status post hospitalization

## 2024-10-02 NOTE — Assessment & Plan Note (Addendum)
 No seizures recent history

## 2024-10-02 NOTE — Assessment & Plan Note (Addendum)
-   Continue pantoprazole

## 2024-10-02 NOTE — Assessment & Plan Note (Addendum)
 Had kyphoplasty on 09/25/2024.  Still has oxycodone  available for pain.  Follow-up with interventional radiology

## 2024-10-03 LAB — CBC AND DIFFERENTIAL
Platelets: 272 K/uL (ref 150–400)
WBC: 12.7

## 2024-10-03 LAB — BASIC METABOLIC PANEL WITH GFR
BUN: 16 (ref 4–21)
Chloride: 102 (ref 99–108)
Creatinine: 1 (ref 0.5–1.1)
Glucose: 88
Potassium: 4.3 meq/L (ref 3.5–5.1)
Sodium: 137 (ref 137–147)

## 2024-10-03 LAB — COMPREHENSIVE METABOLIC PANEL WITH GFR
Calcium: 8.8 (ref 8.7–10.7)
eGFR: 57

## 2024-10-03 LAB — CBC: RBC: 4.19 (ref 3.87–5.11)

## 2024-10-03 LAB — HEPATIC FUNCTION PANEL
ALT: 21 U/L (ref 7–35)
AST: 19 (ref 13–35)

## 2024-10-04 ENCOUNTER — Encounter: Payer: Self-pay | Admitting: Nurse Practitioner

## 2024-10-04 ENCOUNTER — Non-Acute Institutional Stay (SKILLED_NURSING_FACILITY): Payer: Self-pay | Admitting: Nurse Practitioner

## 2024-10-04 DIAGNOSIS — E785 Hyperlipidemia, unspecified: Secondary | ICD-10-CM | POA: Diagnosis not present

## 2024-10-04 DIAGNOSIS — I63531 Cerebral infarction due to unspecified occlusion or stenosis of right posterior cerebral artery: Secondary | ICD-10-CM | POA: Diagnosis not present

## 2024-10-04 DIAGNOSIS — I10 Essential (primary) hypertension: Secondary | ICD-10-CM | POA: Diagnosis not present

## 2024-10-04 DIAGNOSIS — K5901 Slow transit constipation: Secondary | ICD-10-CM | POA: Diagnosis not present

## 2024-10-04 DIAGNOSIS — G40909 Epilepsy, unspecified, not intractable, without status epilepticus: Secondary | ICD-10-CM | POA: Diagnosis not present

## 2024-10-04 DIAGNOSIS — K219 Gastro-esophageal reflux disease without esophagitis: Secondary | ICD-10-CM

## 2024-10-04 MED ORDER — ZONISAMIDE 100 MG PO CAPS: 200.0000 mg | ORAL_CAPSULE | Freq: Every evening | ORAL | 5 refills | Status: AC

## 2024-10-04 NOTE — Assessment & Plan Note (Signed)
 Stable,  taking Senokot

## 2024-10-04 NOTE — Progress Notes (Signed)
 " Location:  [facility] Room Number: [id number] Place of Service:  SNF (31) Provider:  Swanson Farnell X, NP  Patient Care Team: Rozlynn Lippold X, NP as PCP - General (Internal Medicine) Santo Stanly LABOR, MD as PCP - Cardiology (Cardiology) Lanell Starleen DASEN, MD as Referring Physician (Neurology) Roosevelt Erla Shaker, MD as Referring Physician (Dermatology)  Extended Emergency Contact Information Primary Emergency Contact: Bolanos,James Address: [address]          [place], [place] [address] [place] Mobile Phone: [phone] Relation: Son Secondary Emergency Contact: Steffenhagen,Caroline Address: [address]          [place], [place] [address] [place] Mobile Phone: [phone] Relation: Daughter  Code Status:  Full code Goals of care: Advanced Directive information    09/22/2024   10:00 PM  Advanced Directives  Does Patient Have a Medical Advance Directive? Yes     Chief Complaint  Patient presents with   Fall    HPI:  Pt is a 84 y.o. female seen today for an acute visit for s/p fall about 2 days ago, no apparent injury. An active seizure today when the patient became unresponsive, vomited, drooling, incontinent of urine, vitals stable, resolved w/o intervention.   Hospitalized 09/21/2024 to 09/26/2024 for UTI(urine culture shoed multiple species), altered mental status, acute CVA due to occlusion of right posterior cerebral artery, f/u Neurology 10/27/24             09/19/2024 ED eval for s/p fall, no acute fractures of x-ray of lumbar and thoracic spine             Acute back pain since fall 09/19/24, MRI showed T10/T11 fx, Kyphoplasty 09/25/24, Oxycodone  for pain, f/u interventional radiology, TSLO, pain is improved.              Hx of CVA, acute 09/21/2024 occlusion of right posterior cerebral artery, L peripheral vision loss,  on statin, 3 months DAPT Plavis, ASA, then ASA alone             Seizures, taking Zonegran               Constipation, taking Senokot             GERD, stable, taking Pantoprazole              Hospitalized 07/18/24-07/20/24 for seizures-unresponsive episode, UTI, CKD               The patient has chronic knee pain, L>R, only walking with therapy.  Syncope ED eval 05/13/23, unremarkable labs, CXR             Cardiology 04/05/23 in concern of syncope episode in May/hospitalization, on Furosemide  20mg  prn, no cardiac syncope or orthostatic hypotension,  Echo: tricuspid regurgitation, no RV dysfunction. F/u one year.              CT abd 4cm hypodense lesion left lobe of the liver, lesion from the left kidney, recommended US  02/15/24 1. No cholelithiasis or sonographic evidence for acute cholecystitis. 2. Increased hepatic parenchymal echogenicity suggestive of steatosis.             Seizure, ED eval for unresponsive 10-15 minutes, bit her tongue, off Depakote , saw neurology 03/28/23: placed on Zongran. Hospital 01/27/23-01/31/23 for seizure,EEG w/o seizure like activities, no antiepileptics recommended by Neurology,  LOC, CT head no acute process, more concerned of syncope etiology  HTN, Sbp in 140s, self stopped Lisinopril .              Prediabetes Hgb A1c 6.3 03/05/24 Hx of hyperlipidemia, LDL 130 03/05/24,  taking Rosuvastatin  now The patient stated she sleeps better, bathroom trip only 1x/night.  CKD Bun/creat 24/1.08 09/26/24 11/22/22 Mammogram: Further evaluation is suggested for a possible mass in the left breast, repeat Mammogram 02/16/23 showed duct ectasia, cyst.  Edema BLE, self stopped F rosemide daily Elevated TSH 4.72 03/05/24   Past Medical History:  Diagnosis Date   Arthritis    Cancer [facility])    skin   Cataract 2010   Dr. Donn, DrRONITA Lesch @ [facility]   Epilepsy [facility]) 08/11/2015   Hyperlipidemia 12/08/2015   Hypertension    Hypothyroid    Polyp of colon 08/11/2015   Past Surgical History:  Procedure Laterality Date   ABDOMINAL HYSTERECTOMY  1970   APPENDECTOMY  1958   BREAST SURGERY   1984   COLON SURGERY  09/12/2006   FEMUR SURGERY  2018   Broken   IR KYPHO THORACIC WITH BONE BIOPSY  09/25/2024   TONSILLECTOMY  09/12/1944    Allergies[1]  Outpatient Encounter Medications as of 10/04/2024  Medication Sig   acetaminophen  (TYLENOL ) 500 MG tablet Take 2 tablets (1,000 mg total) by mouth every 8 (eight) hours as needed.   aspirin  EC 81 MG tablet Take 1 tablet (81 mg total) by mouth daily. Swallow whole.   Cholecalciferol (VITAMIN D3) 25 MCG CAPS Take 1 capsule by mouth daily.   clopidogrel  (PLAVIX ) 75 MG tablet Take 1 tablet (75 mg total) by mouth daily.   Multiple Vitamins-Minerals (CENTRUM SILVER 50+WOMEN) TABS Take 1 tablet by mouth daily with breakfast.   Multiple Vitamins-Minerals (PRESERVISION AREDS 2) CAPS Take 1 capsule by mouth 2 (two) times daily.   olopatadine (PATANOL) 0.1 % ophthalmic solution Place 1 drop into both eyes every 8 (eight) hours as needed for allergies.   oxyCODONE  (OXY IR/ROXICODONE ) 5 MG immediate release tablet Take 1 tablet (5 mg total) by mouth every 6 (six) hours as needed for up to 7 days for severe pain (pain score 7-10) or breakthrough pain.   pantoprazole  (PROTONIX ) 40 MG tablet Take 1 tablet (40 mg total) by mouth daily.   rosuvastatin  (CRESTOR ) 20 MG tablet Take 1 tablet (20 mg total) by mouth daily.   senna-docusate (SENOKOT-S) 8.6-50 MG tablet Take 1 tablet by mouth at bedtime as needed for mild constipation.   zonisamide  (ZONEGRAN ) 100 MG capsule Take 1 capsule (100 mg total) by mouth at bedtime.   No facility-administered encounter medications on file as of 10/04/2024.    Review of Systems  Constitutional:  Positive for fatigue. Negative for appetite change and fever.  HENT:  Positive for hearing loss. Negative for congestion and trouble swallowing.   Eyes:  Positive for visual disturbance.       Left peripheral vision loss from CVA 09/21/24  Respiratory:  Negative for cough, chest tightness, shortness of breath and wheezing.    Cardiovascular:  Negative for leg swelling.  Gastrointestinal:  Negative for abdominal pain.  Genitourinary:  Positive for frequency. Negative for dysuria and urgency.  Musculoskeletal:  Positive for arthralgias, back pain and gait problem.       T10/T11 kyphoplasty 09/25/24  Skin:  Negative for color change.  Neurological:  Positive for seizures. Negative for speech difficulty, weakness and headaches.  Psychiatric/Behavioral:  Positive for confusion. Negative for sleep disturbance. The patient is not nervous/anxious.  Immunization History  Administered Date(s) Administered   Fluad Quad(high Dose 65+) 07/03/2020, 06/28/2021, 06/28/2022   INFLUENZA, HIGH DOSE SEASONAL PF 06/26/2019   Influenza-Unspecified 06/29/2015, 05/21/2016   Moderna Covid-19 Vaccine  Bivalent Booster 16yrs & up 07/14/2022, 05/27/2023   Moderna Sars-Covid-2 Vaccination 09/16/2019, 10/14/2019, 07/21/2020, 01/21/2021, 01/28/2022   PFIZER(Purple Top)SARS-COV-2 Vaccination 06/01/2021   PPD Test 09/26/2024   Pneumococcal Conjugate-13 01/19/2014   Pneumococcal Polysaccharide-23 07/28/2009   Respiratory Syncytial Virus Vaccine,Recomb Aduvanted(Arexvy) 09/09/2022   Td 07/02/1991, 02/02/1996, 09/25/1996, 07/25/2006   Tdap 12/22/2015   Unspecified SARS-COV-2 Vaccination 06/13/2024   Zoster Recombinant(Shingrix) 05/27/2017, 06/20/2017, 11/24/2017   Pertinent  Health Maintenance Due  Topic Date Due   Influenza Vaccine  Completed   Bone Density Scan  Completed      04/13/2023    1:04 PM 08/31/2023    1:30 PM 11/02/2023    3:33 PM 02/29/2024    3:19 PM 09/19/2024    1:08 PM  Fall Risk  Falls in the past year? 0 0 1 1 0  Was there an injury with Fall? 0  0  0  0  0  Fall Risk Category Calculator 0 0 2 1 0  Patient at Risk for Falls Due to No Fall Risks  History of fall(s) No Fall Risks No Fall Risks  Fall risk Follow up Falls evaluation completed  Falls evaluation completed Falls evaluation completed Falls evaluation  completed     Data saved with a previous flowsheet row definition   Functional Status Survey:    Vitals:   10/04/24 0828  BP: 139/73  Pulse: 84  Temp: (!) 97.3 F (36.3 C)  SpO2: 96%  Weight: 199 lb 4.8 oz (90.4 kg)  Height: 5' 4 (1.626 m)   Body mass index is 34.21 kg/m. Physical Exam Vitals and nursing note reviewed.  Constitutional:      Comments: fatigue  HENT:     Head: Normocephalic and atraumatic.     Nose: Nose normal.     Mouth/Throat:     Mouth: Mucous membranes are moist.  Eyes:     Extraocular Movements: Extraocular movements intact.     Conjunctiva/sclera: Conjunctivae normal.     Pupils: Pupils are equal, round, and reactive to light.  Cardiovascular:     Rate and Rhythm: Normal rate and regular rhythm.     Heart sounds: No murmur heard. Pulmonary:     Effort: Pulmonary effort is normal.     Breath sounds: Rales present.     Comments: Rales posterior lung base.  Abdominal:     General: Bowel sounds are normal.     Palpations: Abdomen is soft.     Tenderness: There is no abdominal tenderness.  Musculoskeletal:        General: Tenderness present.     Cervical back: Normal range of motion and neck supple.     Right lower leg: No edema.     Left lower leg: No edema.     Comments: T10/T11 s/p kyphoplasty 09/25/24  Skin:    General: Skin is warm and dry.  Neurological:     Mental Status: She is alert and oriented to person, place, and time. Mental status is at baseline.     Motor: No weakness.     Coordination: Coordination normal.     Gait: Gait abnormal.     Comments: Left peripheral vision loss since CVA 09/21/24  Psychiatric:     Comments: Postictal state.      Labs reviewed: Recent Labs  09/22/24 0601 09/24/24 0339 09/26/24 0324 10/03/24 0000  NA 142 137 137 137  K 4.3 4.2 4.5 4.3  CL 105 100 101 102  CO2 29 29 28   --   GLUCOSE 120* 92 109*  --   BUN 22 19 24* 16  CREATININE 1.17* 0.88 1.08* 1.0  CALCIUM  9.2 9.2 9.2 8.8  MG   --  2.0 2.1  --    Recent Labs    03/05/24 0709 07/18/24 1719 09/19/24 0000 09/21/24 2218 10/03/24 0000  AST 14 36 13 21 19   ALT 15 25 14 14 21   ALKPHOS  --  125 121 115  --   BILITOT 0.3 0.2  --  0.4  --   PROT 6.9 7.7  --  7.3  --   ALBUMIN  --  3.8 4.0 3.6  --    Recent Labs    09/19/24 0000 09/21/24 2218 09/22/24 0601 09/26/24 0324 10/03/24 0000  WBC 9.6 12.3* 11.0* 9.5 12.7  NEUTROABS 6,586.00 8.2*  --  6.4  --   HGB 12.9 13.4 12.4 12.7  --   HCT 40 41.2 39.6 39.5  --   MCV  --  89.0 90.0 90.0  --   PLT 279 215 211 218 272   Lab Results  Component Value Date   TSH 4.72 (H) 03/05/2024   Lab Results  Component Value Date   HGBA1C 6.1 (H) 09/22/2024   Lab Results  Component Value Date   CHOL 173 09/23/2024   HDL 47 09/23/2024   LDLCALC 108 (H) 09/23/2024   TRIG 88 09/23/2024   CHOLHDL 3.7 09/23/2024    Significant Diagnostic Results in last 30 days:  IR KYPHO THORACIC WITH BONE BIOPSY Result Date: 09/26/2024 CLINICAL DATA:  Intractable pain after a fall leading to T10-T11 fracture. Candidate for kyphoplasty? Briefly, 84 year old female with a history of ground level fall suffering acute T11 fracture and intractable back pain. EXAM: T11 VERTEBRAL BODY BIOPSY AND AUGMENTATION WITH BALLOON KYPHOPLASTY COMPARISON:  MR T-spine, 09/23/2024.  CT T-spine, 09/21/2024. MEDICATIONS: As antibiotic prophylaxis, Ancef  2 gm IV was ordered pre-procedure and administered intravenously within 1 hour of incision. 25 mg Benadryl  IV. 30 mL 0.5% bupivacaine  was administered intramuscular, at the pedicular access sites. ANESTHESIA/SEDATION: Moderate (conscious) sedation was employed during this procedure. A total of Versed  4 mg and Fentanyl  200 mcg was administered intravenously. Moderate Sedation Time: 69 minutes. The patient's level of consciousness and vital signs were monitored continuously by radiology nursing throughout the procedure under my direct supervision. FLUOROSCOPY:  Radiation Exposure Index and estimated peak skin dose (PSD); Reference air kerma (RAK), 406 mGy. COMPLICATIONS: None immediate. PROCEDURE: The procedure, risks (including but not limited to bleeding, infection, organ damage), benefits, and alternatives were explained to the patient and/or patient's representative. Questions regarding the procedure were encouraged and answered. The patient understands and consents to the procedure. The patient was placed prone on the fluoroscopic table. The skin overlying the lower lumbar spine region was then prepped and draped in the usual sterile fashion. Maximal barrier sterile technique was utilized including caps, mask, sterile gowns, sterile gloves, sterile drape, hand hygiene and skin antiseptic. IV fentanyl  and versed  were administered as conscious sedation during continuous cardiorespiratory monitoring by the radiology RN. The LEFT pedicle at T11 was then infiltrated with 0.5% Marcaine  followed by the advancement of a Kyphon trocar needle through the pedicle into the posterior one-third of the vertebral body. The osteo drill was advanced to the anterior third of the  vertebral body. The osteo drill was retracted. Through the working cannula, a Kyphon inflatable bone tamp 15 x 3 was advanced and positioned with the distal marker approximately 5 mm from the anterior aspect of the cortex. Appropriate positioning was confirmed on the AP projection. At this time, the balloon was expanded using contrast via a Kyphon inflation syringe device via micro tubing. In similar fashion, the RIGHT T11 pedicle was infiltrated with 0.5% Marcaine  followed by the advancement of a second Kyphon trocar needle through the right pedicle into the posterior third of the vertebral body. Subsequently, the osteo drill was coaxially advanced to the anterior right third. The osteo drill was exchanged for a Kyphon inflatable bone tamp 15 x 3, advanced to the 5 mm of the anterior aspect of the cortex. The  balloon was then expanded using contrast as above. Inflations were continued until there was near apposition with the superior end plate. At this time, methylmethacrylate mixture was reconstituted in the Kyphon bone mixing device system. This was then loaded into the delivery mechanism, attached to Kyphon bone fillers. The balloons were deflated and removed followed by the instillation of methylmethacrylate mixture with excellent filling in the AP and lateral projections. No extravasation was noted in the disk spaces or posteriorly into the spinal canal. No epidural venous contamination was seen. The working cannulae and the bone filler were then retrieved and removed. Hemostasis was achieved with manual compression. The patient tolerated the procedure well without immediate postprocedural complication. FINDINGS: *Adequate cement filling of the T11 vertebral body on both the AP and lateral projections. *No extravasation was noted in the disc spaces, posteriorly into the spinal canal or along the pedicular access. *No epidural venous contamination was seen. *The patient has suffered a fracture of the T11. It is recommended that patients aged 63 years or older be evaluated for possible testing or treatment of osteoporosis. A copy of this procedure report is sent to the patient's referring physician IMPRESSION: 1. Successful T11 vertebral body biopsy and cement augmentation with balloon kyphoplasty. 2. Per CMS PQRS reporting requirements (PQRS Measure 24): Given the patient's age of greater than 50 and the fracture site (hip, distal radius, or spine), the patient should be tested for osteoporosis using DXA, and the appropriate treatment considered based on the DXA result. Thom Hall, MD [facility] Electronically Signed   By: Thom Hall M.D.   On: 09/26/2024 10:24   MR THORACIC SPINE WO CONTRAST Result Date: 09/23/2024 CLINICAL DATA:  Thoracic compression  fracture EXAM: MRI THORACIC SPINE WITHOUT CONTRAST TECHNIQUE: Multiplanar, multisequence MR imaging of the thoracic spine was performed. No intravenous contrast was administered. COMPARISON:  CT 09/21/2024 FINDINGS: Alignment: Normal Bone marrow signal: There is a chronic compression fracture of T12 with about 20-30% loss of vertebral height. There is an acute fracture through the superior endplate of T11. This extends through the left pedicle. There is ankylosis of the thoracic spine that extends from T3-L1. The fracture extends through the syndesmophytes anteriorly. Thoracic spinal cord: Normal Facet joints: No significant abnormality Intervertebral discs: There is no disc herniation. Paraspinal tissues: There is dependent atelectasis in both lungs with small pleural effusions. IMPRESSION: 1. Chronic benign compression fracture at T12 2. Thoracic ankylosis 3. Acute fracture extending through the anterior syndesmophytes at T10-11, through the superior endplate of T11 and the left pedicle of T11. Electronically Signed   By: Nancyann Burns M.D.   On: 09/23/2024 16:04   CT ANGIO HEAD NECK W  WO CM Result Date: 09/23/2024 EXAM: CTA Head and Neck with Intravenous Contrast. CT Head without Contrast. CLINICAL HISTORY: Stroke TECHNIQUE: Axial CTA images of the head and neck performed with intravenous contrast. MIP reconstructed images were created and reviewed. Axial computed tomography images of the head/brain performed without intravenous contrast. Note: Per PQRS, the description of internal carotid artery percent stenosis, including 0 percent or normal exam, is based on North American Symptomatic Carotid Endarterectomy Trial (NASCET) criteria. Dose reduction technique was used including one or more of the following: automated exposure control, adjustment of mA and kV according to patient size, and/or iterative reconstruction. CONTRAST: With; COMPARISON: MRI Yesterday FINDINGS: CT HEAD: BRAIN: Evolving right PCA  territory infarct with suspected petechial hemorrhage. No progressive mass effect. No mass lesion. No midline shift or extra-axial collection. VENTRICLES: No hydrocephalus. ORBITS: The orbits are unremarkable. SINUSES AND MASTOIDS: The paranasal sinuses and mastoid air cells are clear. CTA NECK: COMMON CAROTID ARTERIES: No significant stenosis. No dissection or occlusion. INTERNAL CAROTID ARTERIES: Approximately 60% stenosis of the proximal right ICA due to atherosclerosis. No dissection or occlusion. VERTEBRAL ARTERIES: Severe right vertebral artery origin stenosis. Left vertebral artery is patent without greater than 50% stenosis. CTA HEAD: ANTERIOR CEREBRAL ARTERIES: Hypoplastic right A1 ACA. No significant stenosis. No occlusion. No aneurysm. MIDDLE CEREBRAL ARTERIES: Severe left M2 MCA stenosis. No occlusion. No aneurysm. POSTERIOR CEREBRAL ARTERIES: Occluded right P2 PCA. Severe left P2 PCA stenosis. No aneurysm. BASILAR ARTERY: No significant stenosis. No occlusion. No aneurysm. OTHER: SOFT TISSUES: No acute finding. No masses or lymphadenopathy. BONES: No acute osseous abnormality. Other: Findings will be communicated to the clinical team by a radiology assistant and documented in PACS/Clario. IMPRESSION: 1. Occluded right P2 PCA. 2. Severe left P2 PCA stenosis. 3. Severe left M2 MCA stenosis. 4. Severe right vertebral artery origin stenosis. 5. Approximately 60% stenosis of the proximal right ICA in the neck. 6. Evolving right PCA territory infarct with suspected petechial hemorrhage. No progressive mass effect. Electronically signed by: Gilmore Molt MD MD 09/23/2024 02:46 PM EST RP Workstation: [id number]   ECHOCARDIOGRAM COMPLETE Result Date: 09/23/2024    ECHOCARDIOGRAM REPORT   Patient Name:   [name] Console Date of Exam: 09/23/2024 Medical Rec #:  [id number]          Height:       64.0 in Accession #:    [id number]         Weight:       198.2 lb Date of Birth:  December 08, 1940         BSA:           1.949 m Patient Age:    83 years           BP:           150/74 mmHg Patient Gender: F                  HR:           79 bpm. Exam Location:  Inpatient Procedure: 2D Echo, Cardiac Doppler, Color Doppler and Intracardiac            Opacification Agent (Both Spectral and Color Flow Doppler were            utilized during procedure). Indications:    Stroke  History:        Patient has prior history of Echocardiogram examinations, most                 recent  01/31/2023. Risk Factors:Hypertension and Dyslipidemia.  Sonographer:    Sherlean Dubin Referring Phys: BINAYA DAHAL IMPRESSIONS  1. Left ventricular ejection fraction, by estimation, is 55 to 60%. The left ventricle has normal function. The left ventricle has no regional wall motion abnormalities. There is mild concentric left ventricular hypertrophy. Left ventricular diastolic parameters are consistent with Grade I diastolic dysfunction (impaired relaxation).  2. Right ventricular systolic function is normal. The right ventricular size is normal. There is normal pulmonary artery systolic pressure.  3. Left atrial size was mildly dilated.  4. The mitral valve was not well visualized. No evidence of mitral valve regurgitation. Severe mitral annular calcification.  5. The aortic valve is grossly normal. There is moderate calcification of the aortic valve. There is moderate thickening of the aortic valve. Aortic valve regurgitation is not visualized. Aortic valve sclerosis/calcification is present, without any evidence of aortic stenosis.  6. The inferior vena cava is normal in size with <50% respiratory variability, suggesting right atrial pressure of 8 mmHg. Comparison(s): No significant change from prior study. Conclusion(s)/Recommendation(s): Technically challenging study, valves not well visualized. If there is clinical concern for embolic etiology of stroke, would consider TEE for better evaluation. FINDINGS  Left Ventricle: Left ventricular ejection fraction,  by estimation, is 55 to 60%. The left ventricle has normal function. The left ventricle has no regional wall motion abnormalities. Definity  contrast agent was given IV to delineate the left ventricular  endocardial borders. The left ventricular internal cavity size was normal in size. There is mild concentric left ventricular hypertrophy. Left ventricular diastolic parameters are consistent with Grade I diastolic dysfunction (impaired relaxation). Right Ventricle: The right ventricular size is normal. Right vetricular wall thickness was not well visualized. Right ventricular systolic function is normal. There is normal pulmonary artery systolic pressure. The tricuspid regurgitant velocity is 1.30 m/s, and with an assumed right atrial pressure of 8 mmHg, the estimated right ventricular systolic pressure is 14.8 mmHg. Left Atrium: Left atrial size was mildly dilated. Right Atrium: Right atrial size was normal in size. Pericardium: There is no evidence of pericardial effusion. Mitral Valve: The mitral valve was not well visualized. Severe mitral annular calcification. No evidence of mitral valve regurgitation. MV peak gradient, 6.6 mmHg. The mean mitral valve gradient is 3.0 mmHg. Tricuspid Valve: The tricuspid valve is not well visualized. Tricuspid valve regurgitation is trivial. No evidence of tricuspid stenosis. Aortic Valve: The aortic valve is grossly normal. There is moderate calcification of the aortic valve. There is moderate thickening of the aortic valve. Aortic valve regurgitation is not visualized. Aortic valve sclerosis/calcification is present, without any evidence of aortic stenosis. Aortic valve mean gradient measures 3.0 mmHg. Aortic valve peak gradient measures 6.1 mmHg. Aortic valve area, by VTI measures 2.50 cm. Pulmonic Valve: The pulmonic valve was not well visualized. Pulmonic valve regurgitation is not visualized. No evidence of pulmonic stenosis. Aorta: The aortic root is normal in size and  structure, the aortic arch was not well visualized and the ascending aorta was not well visualized. Venous: The inferior vena cava is normal in size with less than 50% respiratory variability, suggesting right atrial pressure of 8 mmHg. IAS/Shunts: The atrial septum is grossly normal.  LEFT VENTRICLE PLAX 2D LVIDd:         3.66 cm   Diastology LVIDs:         2.60 cm   LV e' medial:    4.35 cm/s LV PW:         1.15 cm  LV E/e' medial:  17.8 LV IVS:        1.20 cm   LV e' lateral:   4.46 cm/s LVOT diam:     2.00 cm   LV E/e' lateral: 17.4 LV SV:         62 LV SV Index:   32 LVOT Area:     3.14 cm  RIGHT VENTRICLE             IVC RV S prime:     15.40 cm/s  IVC diam: 1.80 cm TAPSE (M-mode): 1.8 cm LEFT ATRIUM             Index        RIGHT ATRIUM           Index LA diam:        2.70 cm 1.39 cm/m   RA Area:     17.00 cm LA Vol (A2C):   69.8 ml 35.82 ml/m  RA Volume:   48.10 ml  24.68 ml/m LA Vol (A4C):   51.1 ml 26.22 ml/m LA Biplane Vol: 58.9 ml 30.22 ml/m  AORTIC VALVE AV Area (Vmax):    2.36 cm AV Area (Vmean):   2.51 cm AV Area (VTI):     2.50 cm AV Vmax:           123.00 cm/s AV Vmean:          80.650 cm/s AV VTI:            0.248 m AV Peak Grad:      6.1 mmHg AV Mean Grad:      3.0 mmHg LVOT Vmax:         92.50 cm/s LVOT Vmean:        64.450 cm/s LVOT VTI:          0.198 m LVOT/AV VTI ratio: 0.80  AORTA Ao Root diam: 2.80 cm MITRAL VALVE                TRICUSPID VALVE MV Area (PHT): 3.85 cm     TR Peak grad:   6.8 mmHg MV Area VTI:   2.92 cm     TR Vmax:        130.00 cm/s MV Peak grad:  6.6 mmHg MV Mean grad:  3.0 mmHg     SHUNTS MV Vmax:       1.28 m/s     Systemic VTI:  0.20 m MV Vmean:      78.2 cm/s    Systemic Diam: 2.00 cm MV Decel Time: 197 msec MV E velocity: 77.60 cm/s MV A velocity: 128.00 cm/s MV E/A ratio:  0.61 Shelda Bruckner MD Electronically signed by Shelda Bruckner MD Signature Date/Time: 09/23/2024/11:36:32 AM    Final    MR BRAIN WO CONTRAST Result Date:  09/22/2024 EXAM: MRI BRAIN WITHOUT CONTRAST 09/22/2024 11:18:41 AM TECHNIQUE: Multiplanar multisequence MRI of the head/brain was performed without the administration of intravenous contrast. COMPARISON: Head CT 09/21/2024. CLINICAL HISTORY: Stroke. FINDINGS: BRAIN AND VENTRICLES: A large subacute right PCA infarct is again seen with associated restricted diffusion, cytotoxic edema, cortical laminar necrosis, and likely minimal petechial blood products. No mass, midline shift, extra-axial fluid collection, or hydrocephalus is evident. T2 hyperintensities in the cerebral white matter bilaterally are nonspecific but compatible with mild chronic small vessel ischemic disease. There is mild generalized cerebral atrophy. Major intracranial vascular flow voids are preserved. ORBITS: Bilateral cataract extraction. SINUSES AND MASTOIDS: Minimal mucosal thickening in the paranasal sinuses. Clear  mastoid air cells. BONES AND SOFT TISSUES: Normal marrow signal. No significant soft tissue abnormality. IMPRESSION: 1. Large subacute right PCA infarct. 2. Mild chronic small vessel ischemic disease. Electronically signed by: Dasie Hamburg MD MD 09/22/2024 11:49 AM EST RP Workstation: [id number]   CT CERVICAL SPINE WO CONTRAST Result Date: 09/21/2024 EXAM: CT CERVICAL SPINE WITHOUT CONTRAST 09/21/2024 11:27:03 PM TECHNIQUE: CT of the cervical spine was performed without the administration of intravenous contrast. Multiplanar reformatted images are provided for review. Automated exposure control, iterative reconstruction, and/or weight based adjustment of the mA/kV was utilized to reduce the radiation dose to as low as reasonably achievable. COMPARISON: None available. CLINICAL HISTORY: fall FINDINGS: BONES AND ALIGNMENT: No acute fracture or traumatic malalignment. DEGENERATIVE CHANGES: Degenerative disc disease is greatest at C5-C6, where there is endplate sclerosis and uncovertebral spurring. Lytic atherosclerosis of the facet.  No high-grade cervical spinal canal stenosis. SOFT TISSUES: No prevertebral soft tissue swelling. IMPRESSION: 1. Degenerative disc disease greatest at C5-C6 with endplate sclerosis and uncovertebral spurring. 2. No high-grade cervical spinal canal stenosis. Electronically signed by: Franky Stanford MD MD 09/21/2024 11:45 PM EST RP Workstation: [id number]   CT Thoracic Spine Wo Contrast Result Date: 09/21/2024 EXAM: CT THORACIC SPINE WITHOUT CONTRAST 09/21/2024 11:27:03 PM TECHNIQUE: CT of the thoracic spine was performed without the administration of intravenous contrast. Multiplanar reformatted images are provided for review. Automated exposure control, iterative reconstruction, and/or weight based adjustment of the mA/kV was utilized to reduce the radiation dose to as low as reasonably achievable. COMPARISON: None available. CLINICAL HISTORY: Mid-back pain. FINDINGS: BONES AND ALIGNMENT: Vertebral body heights are maintained except for the superior T11 endplate, which is fractured and uplifted. At T10-T11, there is evidence of acute hyperextension injury with anterior widening of the T10-T11 disc space. There is no visible osseous extension of the fracture into the posterior elements. According to the AO Spine classification of thoracolumbar injuries, the finding is consistent with a T10-T11: B3 hyperextension distraction injury (with associated anterior endplate fracture at T11) and the recommendation is not specified by this descriptive classification alone. No suspicious bone lesion. DEGENERATIVE CHANGES: There are flowing anterior syndesmophytes along the length of the thoracic spine. SOFT TISSUES: No acute abnormality. IMPRESSION: 1. Acute hyperextension injury at T10-T11 with fracture and uplifting of the superior T11 endplate and anterior widening of the T10-11 disc space, consistent with a T10-T11 B3 hyperextension distraction injury per AO Spine classification. 2. Thoracic ankylosis Electronically signed  by: Franky Stanford MD MD 09/21/2024 11:40 PM EST RP Workstation: [id number]   CT Lumbar Spine Wo Contrast Result Date: 09/21/2024 EXAM: CT OF THE LUMBAR SPINE WITHOUT CONTRAST 09/21/2024 11:27:03 PM TECHNIQUE: CT of the lumbar spine was performed without the administration of intravenous contrast. Multiplanar reformatted images are provided for review. Automated exposure control, iterative reconstruction, and/or weight based adjustment of the mA/kV was utilized to reduce the radiation dose to as low as reasonably achievable. COMPARISON: None available. CLINICAL HISTORY: Low back pain, trauma. FINDINGS: BONES AND ALIGNMENT: Normal vertebral body heights. No acute fracture or suspicious bone lesion. Grade 1 retrolisthesis at L2-L3 and L3-L4. Lumbar levoscoliosis with apex at L4. DEGENERATIVE CHANGES: Multilevel degenerative disc disease with disc space narrowing and mild endplate remodeling. Multilevel severe lower lumbar facet arthrosis. Moderate spinal canal stenosis at L3-L4, L4-L5, and L5-S1. Moderate bilateral L5 neural foraminal stenosis. SOFT TISSUES: Calcific aortic sclerosis. Cholelithiasis. No acute abnormality. IMPRESSION: 1. No evidence of acute traumatic injury. 2. Moderate spinal canal stenosis at L3-L4, L4-L5, and L5-S1. 3. Moderate  bilateral L5 neural foraminal stenosis. Electronically signed by: Franky Stanford MD MD 09/21/2024 11:35 PM EST RP Workstation: [id number]   CT HEAD WO CONTRAST ( ) Result Date: 09/21/2024 EXAM: CT HEAD WITHOUT CONTRAST 09/21/2024 11:27:03 PM TECHNIQUE: CT of the head was performed without the administration of intravenous contrast. Automated exposure control, iterative reconstruction, and/or weight based adjustment of the mA/kV was utilized to reduce the radiation dose to as low as reasonably achievable. COMPARISON: 07/18/2024 CLINICAL HISTORY: Delirium FINDINGS: BRAIN AND VENTRICLES: No acute hemorrhage. No evidence of acute infarct. Since the prior study,  encephalomalacia has developed in the right PCA territory. This appears late subacute. MRI would be helpful for better temporal characterization. No hydrocephalus. No extra-axial collection. No mass effect or midline shift. ORBITS: No acute abnormality. SINUSES: No acute abnormality. SOFT TISSUES AND SKULL: No acute soft tissue abnormality. No skull fracture. IMPRESSION: 1. 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 2. MRI would be helpful for more precise temporal characterization. Electronically signed by: Franky Stanford MD MD 09/21/2024 11:32 PM EST RP Workstation: [id number]   DG Thoracic Spine 2 View Result Date: 09/19/2024 EXAM: 2 VIEW(S) XRAY OF THE THORACIC SPINE 09/19/2024 06:50:00 PM COMPARISON: None available. CLINICAL HISTORY: Fall. FINDINGS: BONES: Vertebral body heights are maintained. Alignment is normal. DISCS AND DEGENERATIVE CHANGES: Mild degenerative changes of the lower thoracic / upper lumbar spine. SOFT TISSUES: The visualized lungs are clear. IMPRESSION: 1. No acute findings. Electronically signed by: Pinkie Pebbles MD MD 09/19/2024 07:09 PM EST RP Workstation: [id number]   DG Lumbar Spine Complete Result Date: 09/19/2024 EXAM: 4 OR MORE VIEW(S) XRAY OF THE LUMBAR SPINE 09/19/2024 06:50:00 PM COMPARISON: None available. CLINICAL HISTORY: fall FINDINGS: LUMBAR SPINE: BONES: Vertebral body heights are maintained. Alignment: Mild upper lumbar dextroscoliosis. Mild superior endplate compression fracture deformity at T12, chronic. DISCS AND DEGENERATIVE CHANGES: Mild multilevel degenerative changes, most prominent at L1-L2 and L5-S1. SOFT TISSUES: No acute abnormality. IMPRESSION: 1. No acute findings. Electronically signed by: Pinkie Pebbles MD MD 09/19/2024 07:09 PM EST RP Workstation: [id number]    Assessment/Plan Seizure disorder [facility])  An active seizure today when the patient became unresponsive, vomited, drooling,  incontinent of urine, vitals stable, resolved w/o intervention.  Hospitalized 07/18/24-07/20/24 for seizures-unresponsive episode Seizure, ED eval for unresponsive 10-15 minutes, bit her tongue, off Depakote , saw neurology 03/28/23: placed on Zongran. Hospital 01/27/23-01/31/23 for seizure,EEG w/o seizure like activities, no antiepileptics recommended by Neurology,  LOC, CT head no acute process, more concerned of syncope etiology Will increase Zonegran  to 200mg /100mg  at bedtime, VS and Neuro check qshift x 72hrs.  10/03/24 Na 137, K 4.3, Bun 16, creat 0.97, wbc 12.7, Hgb 11.8, plt 272, neutrophils 67.2% CXR ap/lateral, UA C/S in setting of active seizure vs possible TIA vs hx of Syncope associated with onset of UTI and leukocytosis 10/03/24  Acute cerebrovascular accident (CVA) due to occlusion of right posterior cerebral artery (HCC) Hx of CVA, acute 09/21/2024 occlusion of right posterior cerebral artery, L peripheral vision loss,  on statin, 3 months DAPT Plavis, ASA, then ASA alone  Slow transit constipation Stable,  taking Senokot  GERD (gastroesophageal reflux disease) stable, taking Pantoprazole   HTN (hypertension) Blood pressure is controlled, off Lisinopril       Family/ staff Communication: plan of care reviewed with the patient and charge nurse.   Labs/tests ordered:  UA C/S, CXR ap/lateral.      [1]  Allergies Allergen Reactions   Barbiturates Other (See Comments)  A small amount overdosed the patient- affected my stability and balance (might have been given at the same as another med?)   Cefdinir Other (See Comments)    A small amount overdosed the patient- affected my stability and balance (might have been given at the same as another med?)   Codeine Other (See Comments)    Moderate, per pharmacy   Phenobarbital  Other (See Comments)    A small amount overdosed the patient- affected my stability and balance (might have been given at the same as another med?)    Tegretol  [Carbamazepine ] Other (See Comments)    A small amount overdosed the patient- affected my stability and balance (might have been given at the same as another med?)   Tramadol Other (See Comments)    Moderate, per pharmacy   "

## 2024-10-04 NOTE — Assessment & Plan Note (Signed)
 Blood pressure is controlled, off Lisinopril 

## 2024-10-04 NOTE — Assessment & Plan Note (Signed)
 Hx of hyperlipidemia, LDL 130 03/05/24,  taking Rosuvastatin  now

## 2024-10-04 NOTE — Assessment & Plan Note (Signed)
 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

## 2024-10-04 NOTE — Assessment & Plan Note (Signed)
stable, taking Pantoprazole 

## 2024-10-04 NOTE — Assessment & Plan Note (Signed)
"   An active seizure today when the patient became unresponsive, vomited, drooling, incontinent of urine, vitals stable, resolved w/o intervention.  Hospitalized 07/18/24-07/20/24 for seizures-unresponsive episode Seizure, ED eval for unresponsive 10-15 minutes, bit her tongue, off Depakote , saw neurology 03/28/23: placed on Zongran. Hospital 01/27/23-01/31/23 for seizure,EEG w/o seizure like activities, no antiepileptics recommended by Neurology,  LOC, CT head no acute process, more concerned of syncope etiology Will increase Zonegran  to 200mg /100mg  at bedtime, VS and Neuro check qshift x 72hrs.  10/03/24 Na 137, K 4.3, Bun 16, creat 0.97, wbc 12.7, Hgb 11.8, plt 272, neutrophils 67.2% CXR ap/lateral, UA C/S in setting of active seizure vs possible TIA vs hx of Syncope associated with onset of UTI and leukocytosis 10/03/24 "

## 2024-10-09 ENCOUNTER — Other Ambulatory Visit: Payer: Self-pay | Admitting: Interventional Radiology

## 2024-10-09 DIAGNOSIS — M4854XG Collapsed vertebra, not elsewhere classified, thoracic region, subsequent encounter for fracture with delayed healing: Secondary | ICD-10-CM

## 2024-10-11 ENCOUNTER — Ambulatory Visit
Admission: RE | Admit: 2024-10-11 | Discharge: 2024-10-11 | Disposition: A | Source: Ambulatory Visit | Attending: Interventional Radiology

## 2024-10-11 DIAGNOSIS — M4854XG Collapsed vertebra, not elsewhere classified, thoracic region, subsequent encounter for fracture with delayed healing: Secondary | ICD-10-CM

## 2024-11-12 ENCOUNTER — Inpatient Hospital Stay: Admitting: Neurology

## 2024-11-12 ENCOUNTER — Ambulatory Visit: Admitting: Emergency Medicine
